# Patient Record
Sex: Male | Born: 1945 | Race: Black or African American | Hispanic: No | Smoking: Current every day smoker
Health system: Southern US, Community
[De-identification: ages and names within clinical notes are randomized; demographics above are authoritative.]

## PROBLEM LIST (undated history)

## (undated) DIAGNOSIS — Z9289 Personal history of other medical treatment: Secondary | ICD-10-CM

## (undated) DIAGNOSIS — I10 Essential (primary) hypertension: Secondary | ICD-10-CM

## (undated) DIAGNOSIS — C61 Malignant neoplasm of prostate: Secondary | ICD-10-CM

## (undated) DIAGNOSIS — IMO0002 Reserved for concepts with insufficient information to code with codable children: Secondary | ICD-10-CM

## (undated) DIAGNOSIS — F329 Major depressive disorder, single episode, unspecified: Secondary | ICD-10-CM

## (undated) DIAGNOSIS — R202 Paresthesia of skin: Secondary | ICD-10-CM

## (undated) DIAGNOSIS — M88859 Osteitis deformans of unspecified thigh: Secondary | ICD-10-CM

## (undated) DIAGNOSIS — R200 Anesthesia of skin: Secondary | ICD-10-CM

## (undated) DIAGNOSIS — M1990 Unspecified osteoarthritis, unspecified site: Secondary | ICD-10-CM

## (undated) DIAGNOSIS — F32A Depression, unspecified: Secondary | ICD-10-CM

## (undated) HISTORY — PX: WRIST GANGLION EXCISION: SUR520

---

## 1997-08-23 ENCOUNTER — Emergency Department (HOSPITAL_COMMUNITY): Admission: EM | Admit: 1997-08-23 | Discharge: 1997-08-23 | Payer: Self-pay | Admitting: Emergency Medicine

## 1998-08-06 ENCOUNTER — Encounter: Payer: Self-pay | Admitting: Emergency Medicine

## 1998-08-06 ENCOUNTER — Emergency Department (HOSPITAL_COMMUNITY): Admission: EM | Admit: 1998-08-06 | Discharge: 1998-08-06 | Payer: Self-pay | Admitting: Emergency Medicine

## 2003-11-29 ENCOUNTER — Emergency Department (HOSPITAL_COMMUNITY): Admission: EM | Admit: 2003-11-29 | Discharge: 2003-11-29 | Payer: Self-pay | Admitting: Emergency Medicine

## 2003-12-08 ENCOUNTER — Ambulatory Visit: Payer: Self-pay | Admitting: Internal Medicine

## 2004-08-08 ENCOUNTER — Emergency Department (HOSPITAL_COMMUNITY): Admission: EM | Admit: 2004-08-08 | Discharge: 2004-08-08 | Payer: Self-pay | Admitting: Emergency Medicine

## 2007-05-03 ENCOUNTER — Emergency Department (HOSPITAL_COMMUNITY): Admission: EM | Admit: 2007-05-03 | Discharge: 2007-05-03 | Payer: Self-pay | Admitting: Emergency Medicine

## 2008-08-21 ENCOUNTER — Emergency Department (HOSPITAL_COMMUNITY): Admission: EM | Admit: 2008-08-21 | Discharge: 2008-08-21 | Payer: Self-pay | Admitting: Emergency Medicine

## 2009-03-01 ENCOUNTER — Ambulatory Visit: Payer: Self-pay | Admitting: Internal Medicine

## 2009-03-01 ENCOUNTER — Encounter (INDEPENDENT_AMBULATORY_CARE_PROVIDER_SITE_OTHER): Payer: Self-pay | Admitting: Family Medicine

## 2009-03-01 LAB — CONVERTED CEMR LAB
Albumin: 4.8 g/dL (ref 3.5–5.2)
BUN: 12 mg/dL (ref 6–23)
Basophils Absolute: 0 10*3/uL (ref 0.0–0.1)
Basophils Relative: 0 % (ref 0–1)
CO2: 25 meq/L (ref 19–32)
Chloride: 103 meq/L (ref 96–112)
Eosinophils Absolute: 0.1 10*3/uL (ref 0.0–0.7)
HCT: 39.4 % (ref 39.0–52.0)
Lymphocytes Relative: 43 % (ref 12–46)
Lymphs Abs: 2.5 10*3/uL (ref 0.7–4.0)
MCHC: 33.2 g/dL (ref 30.0–36.0)
Monocytes Absolute: 0.6 10*3/uL (ref 0.1–1.0)
Neutro Abs: 2.6 10*3/uL (ref 1.7–7.7)
Neutrophils Relative %: 45 % (ref 43–77)
Potassium: 4.2 meq/L (ref 3.5–5.3)
RBC: 4.62 M/uL (ref 4.22–5.81)
Total Bilirubin: 0.6 mg/dL (ref 0.3–1.2)
Total CHOL/HDL Ratio: 4.6

## 2009-03-17 ENCOUNTER — Ambulatory Visit: Payer: Self-pay | Admitting: Internal Medicine

## 2009-03-28 ENCOUNTER — Ambulatory Visit: Payer: Self-pay | Admitting: Internal Medicine

## 2009-03-30 ENCOUNTER — Ambulatory Visit: Payer: Self-pay | Admitting: Internal Medicine

## 2009-04-06 ENCOUNTER — Ambulatory Visit (HOSPITAL_COMMUNITY): Admission: RE | Admit: 2009-04-06 | Discharge: 2009-04-06 | Payer: Self-pay | Admitting: Family Medicine

## 2009-05-17 ENCOUNTER — Ambulatory Visit: Payer: Self-pay | Admitting: Internal Medicine

## 2009-06-07 ENCOUNTER — Ambulatory Visit: Payer: Self-pay | Admitting: Internal Medicine

## 2009-06-07 LAB — CONVERTED CEMR LAB
BUN: 11 mg/dL (ref 6–23)
CO2: 24 meq/L (ref 19–32)
Chloride: 103 meq/L (ref 96–112)
Cholesterol: 200 mg/dL (ref 0–200)
LDL Cholesterol: 130 mg/dL — ABNORMAL HIGH (ref 0–99)
Potassium: 4.3 meq/L (ref 3.5–5.3)
Total CHOL/HDL Ratio: 4.1
VLDL: 21 mg/dL (ref 0–40)

## 2009-07-14 ENCOUNTER — Ambulatory Visit: Payer: Self-pay | Admitting: Internal Medicine

## 2009-07-14 LAB — CONVERTED CEMR LAB
BUN: 11 mg/dL (ref 6–23)
CO2: 23 meq/L (ref 19–32)
Calcium: 9.4 mg/dL (ref 8.4–10.5)
Cholesterol: 197 mg/dL (ref 0–200)
Creatinine, Ser: 1.03 mg/dL (ref 0.40–1.50)
Glucose, Bld: 98 mg/dL (ref 70–99)
Potassium: 4.1 meq/L (ref 3.5–5.3)
Sodium: 138 meq/L (ref 135–145)
Triglycerides: 62 mg/dL (ref <150)
VLDL: 12 mg/dL (ref 0–40)

## 2009-07-28 ENCOUNTER — Ambulatory Visit: Payer: Self-pay | Admitting: Internal Medicine

## 2009-08-18 ENCOUNTER — Ambulatory Visit: Payer: Self-pay | Admitting: Internal Medicine

## 2010-01-02 ENCOUNTER — Encounter (INDEPENDENT_AMBULATORY_CARE_PROVIDER_SITE_OTHER): Payer: Self-pay | Admitting: *Deleted

## 2013-06-18 ENCOUNTER — Other Ambulatory Visit (HOSPITAL_COMMUNITY): Payer: Self-pay | Admitting: Urology

## 2013-06-18 ENCOUNTER — Ambulatory Visit (HOSPITAL_COMMUNITY)
Admission: RE | Admit: 2013-06-18 | Discharge: 2013-06-18 | Disposition: A | Discharge status: Home / Self Care | Payer: Medicare Other | Source: Ambulatory Visit | Attending: Urology | Admitting: Urology

## 2013-06-18 DIAGNOSIS — N508 Other specified disorders of male genital organs: Secondary | ICD-10-CM | POA: Insufficient documentation

## 2013-06-18 DIAGNOSIS — N5089 Other specified disorders of the male genital organs: Secondary | ICD-10-CM

## 2013-07-03 ENCOUNTER — Other Ambulatory Visit (HOSPITAL_COMMUNITY): Payer: Self-pay | Admitting: Urology

## 2013-07-03 DIAGNOSIS — C61 Malignant neoplasm of prostate: Secondary | ICD-10-CM

## 2013-07-22 ENCOUNTER — Other Ambulatory Visit (HOSPITAL_COMMUNITY): Payer: Self-pay | Admitting: Nurse Practitioner

## 2013-07-22 ENCOUNTER — Ambulatory Visit (HOSPITAL_COMMUNITY)
Admission: RE | Admit: 2013-07-22 | Discharge: 2013-07-22 | Disposition: A | Discharge status: Home / Self Care | Payer: Medicare Other | Source: Ambulatory Visit | Attending: Nurse Practitioner | Admitting: Nurse Practitioner

## 2013-07-22 DIAGNOSIS — R52 Pain, unspecified: Secondary | ICD-10-CM

## 2013-07-22 DIAGNOSIS — M25559 Pain in unspecified hip: Secondary | ICD-10-CM | POA: Diagnosis present

## 2013-07-22 DIAGNOSIS — M889 Osteitis deformans of unspecified bone: Secondary | ICD-10-CM | POA: Diagnosis not present

## 2013-07-23 ENCOUNTER — Other Ambulatory Visit (HOSPITAL_COMMUNITY): Payer: Self-pay | Admitting: Urology

## 2013-07-23 ENCOUNTER — Encounter (HOSPITAL_COMMUNITY)
Admission: RE | Admit: 2013-07-23 | Discharge: 2013-07-23 | Disposition: A | Discharge status: Home / Self Care | Payer: Medicare Other | Source: Ambulatory Visit | Attending: Urology | Admitting: Urology

## 2013-07-23 ENCOUNTER — Ambulatory Visit (HOSPITAL_COMMUNITY)
Admission: RE | Admit: 2013-07-23 | Discharge: 2013-07-23 | Disposition: A | Discharge status: Home / Self Care | Payer: Medicare Other | Source: Ambulatory Visit | Attending: Urology | Admitting: Urology

## 2013-07-23 DIAGNOSIS — I10 Essential (primary) hypertension: Secondary | ICD-10-CM | POA: Insufficient documentation

## 2013-07-23 DIAGNOSIS — C61 Malignant neoplasm of prostate: Secondary | ICD-10-CM | POA: Insufficient documentation

## 2013-07-23 MED ORDER — TECHNETIUM TC 99M MEDRONATE IV KIT
26.0000 | PACK | Freq: Once | INTRAVENOUS | Status: AC | PRN
  Administered 2013-07-23: 26 via INTRAVENOUS

## 2013-07-29 ENCOUNTER — Other Ambulatory Visit (HOSPITAL_COMMUNITY): Payer: Self-pay | Admitting: Urology

## 2013-07-29 ENCOUNTER — Ambulatory Visit (HOSPITAL_COMMUNITY)
Admission: RE | Admit: 2013-07-29 | Discharge: 2013-07-29 | Disposition: A | Discharge status: Home / Self Care | Payer: Medicare Other | Source: Ambulatory Visit | Attending: Urology | Admitting: Urology

## 2013-07-29 DIAGNOSIS — Z8546 Personal history of malignant neoplasm of prostate: Secondary | ICD-10-CM | POA: Diagnosis present

## 2013-07-29 DIAGNOSIS — C61 Malignant neoplasm of prostate: Secondary | ICD-10-CM

## 2013-08-11 ENCOUNTER — Other Ambulatory Visit: Payer: Self-pay | Admitting: Urology

## 2013-08-24 ENCOUNTER — Encounter (HOSPITAL_COMMUNITY): Payer: Self-pay | Admitting: Pharmacy Technician

## 2013-08-25 NOTE — Patient Instructions (Addendum)
Marcus [name]  08/25/2013                           YOUR PROCEDURE IS SCHEDULED ON:  09/04/13               ENTER THRU [facility] ENTRANCE AND                            FOLLOW  SIGNS TO SHORT STAY CENTER                 ARRIVE AT SHORT STAY AT:  10:45 AM               CALL THIS NUMBER IF ANY PROBLEMS THE DAY OF SURGERY :               [phone]                                REMEMBER:   Do not eat food or drink liquids AFTER MIDNIGHT              MAY HAVE ONLY  WATER UNTIL 6:45 AM                  Take these medicines the morning of surgery with               A SIPS OF WATER :    NORVASC     Do not wear jewelry, make-up   Do not wear lotions, powders, or perfumes.   Do not shave legs or underarms 12 hrs. before surgery (men may shave face)  Do not bring valuables to the hospital.  Contacts, dentures or bridgework may not be worn into surgery.  Leave suitcase in the car. After surgery it may be brought to your room.  For patients admitted to the hospital more than one night, checkout time is            11:00 AM                                                       The day of discharge.   Patients discharged the day of surgery will not be allowed to drive home.            If going home same day of surgery, must have someone stay with you              FIRST 24 hrs at home and arrange for some one to drive you              home from hospital.   ________________________________________________________________________                                                                        [place]  Before surgery, you can play an important role.  Because skin is not sterile, your  skin needs to be as free of germs as possible.  You can reduce the number of germs on your skin by washing with CHG (chlorahexidine gluconate) soap before surgery.  CHG is an antiseptic cleaner which kills germs and bonds with the skin to  continue killing germs even after washing. Please DO NOT use if you have an allergy to CHG or antibacterial soaps.  If your skin becomes reddened/irritated stop using the CHG and inform your nurse when you arrive at Short Stay. Do not shave (including legs and underarms) for at least 48 hours prior to the first CHG shower.  You may shave your face. Please follow these instructions carefully:   1.  Shower with CHG Soap the night before surgery and the  morning of Surgery.   2.  If you choose to wash your hair, wash your hair first as usual with your  normal  Shampoo.   3.  After you shampoo, rinse your hair and body thoroughly to remove the  shampoo.                                         4.  Use CHG as you would any other liquid soap.  You can apply chg directly  to the skin and wash . Gently wash with scrungie or clean wascloth    5.  Apply the CHG Soap to your body ONLY FROM THE NECK DOWN.   Do not use on open                           Wound or open sores. Avoid contact with eyes, ears mouth and genitals (private parts).                        Genitals (private parts) with your normal soap.              6.  Wash thoroughly, paying special attention to the area where your surgery  will be performed.   7.  Thoroughly rinse your body with warm water from the neck down.   8.  DO NOT shower/wash with your normal soap after using and rinsing off  the CHG Soap .                9.  Pat yourself dry with a clean towel.             10.  Wear clean pajamas.             11.  Place clean sheets on your bed the night of your first shower and do not  sleep with pets.  Day of Surgery : Do not apply any lotions/deodorants the morning of surgery.  Please wear clean clothes to the hospital/surgery center.  FAILURE TO FOLLOW THESE INSTRUCTIONS MAY RESULT IN THE CANCELLATION OF YOUR SURGERY    PATIENT  SIGNATURE_________________________________  ______________________________________________________________________    WHAT IS A BLOOD TRANSFUSION? Blood Transfusion Information  A transfusion is the replacement of blood or some of its parts. Blood is made up of multiple cells which provide different functions.  Red blood cells carry oxygen and are used for blood loss replacement.  White blood cells fight against infection.  Platelets control bleeding.  Plasma helps clot blood.  Other blood products are available for specialized needs, such as hemophilia  or other clotting disorders. BEFORE THE TRANSFUSION  Who gives blood for transfusions?   Healthy volunteers who are fully evaluated to make sure their blood is safe. This is blood bank blood. Transfusion therapy is the safest it has ever been in the practice of medicine. Before blood is taken from a donor, a complete history is taken to make sure that person has no history of diseases nor engages in risky social behavior (examples are intravenous drug use or sexual activity with multiple partners). The donor's travel history is screened to minimize risk of transmitting infections, such as malaria. The donated blood is tested for signs of infectious diseases, such as HIV and hepatitis. The blood is then tested to be sure it is compatible with you in order to minimize the chance of a transfusion reaction. If you or a relative donates blood, this is often done in anticipation of surgery and is not appropriate for emergency situations. It takes many days to process the donated blood. RISKS AND COMPLICATIONS Although transfusion therapy is very safe and saves many lives, the main dangers of transfusion include:   Getting an infectious disease.  Developing a transfusion reaction. This is an allergic reaction to something in the blood you were given. Every precaution is taken to prevent this. The decision to have a blood transfusion has been  considered carefully by your caregiver before blood is given. Blood is not given unless the benefits outweigh the risks. AFTER THE TRANSFUSION  Right after receiving a blood transfusion, you will usually feel much better and more energetic. This is especially true if your red blood cells have gotten low (anemic). The transfusion raises the level of the red blood cells which carry oxygen, and this usually causes an energy increase.  The nurse administering the transfusion will monitor you carefully for complications. HOME CARE INSTRUCTIONS  No special instructions are needed after a transfusion. You may find your energy is better. Speak with your caregiver about any limitations on activity for underlying diseases you may have. SEEK MEDICAL CARE IF:   Your condition is not improving after your transfusion.  You develop redness or irritation at the intravenous (IV) site. SEEK IMMEDIATE MEDICAL CARE IF:  Any of the following symptoms occur over the next 12 hours:  Shaking chills.  You have a temperature by mouth above 102 F (38.9 C), not controlled by medicine.  Chest, back, or muscle pain.  People around you feel you are not acting correctly or are confused.  Shortness of breath or difficulty breathing.  Dizziness and fainting.  You get a rash or develop hives.  You have a decrease in urine output.  Your urine turns a dark color or changes to pink, red, or brown. Any of the following symptoms occur over the next 10 days:  You have a temperature by mouth above 102 F (38.9 C), not controlled by medicine.  Shortness of breath.  Weakness after normal activity.  The white part of the eye turns yellow (jaundice).  You have a decrease in the amount of urine or are urinating less often.  Your urine turns a dark color or changes to pink, red, or brown. Document Released: 12/30/1999 Document Revised: 03/26/2011 Document Reviewed: 08/18/2007 [facility] Patient Information 2014  [place], [place].  _______________________________________________________________________

## 2013-08-26 ENCOUNTER — Encounter (HOSPITAL_COMMUNITY)
Admission: RE | Admit: 2013-08-26 | Discharge: 2013-08-26 | Disposition: A | Discharge status: Home / Self Care | Payer: Medicare Other | Source: Ambulatory Visit | Attending: Urology | Admitting: Urology

## 2013-08-26 ENCOUNTER — Encounter (HOSPITAL_COMMUNITY): Payer: Self-pay

## 2013-08-26 DIAGNOSIS — Z01810 Encounter for preprocedural cardiovascular examination: Secondary | ICD-10-CM | POA: Diagnosis not present

## 2013-08-26 DIAGNOSIS — I498 Other specified cardiac arrhythmias: Secondary | ICD-10-CM | POA: Insufficient documentation

## 2013-08-26 DIAGNOSIS — Z01812 Encounter for preprocedural laboratory examination: Secondary | ICD-10-CM | POA: Diagnosis present

## 2013-08-26 DIAGNOSIS — Z01818 Encounter for other preprocedural examination: Secondary | ICD-10-CM | POA: Diagnosis not present

## 2013-08-26 HISTORY — DX: Depression, unspecified: F32.A

## 2013-08-26 HISTORY — DX: Anesthesia of skin: R20.0

## 2013-08-26 HISTORY — DX: Personal history of other medical treatment: Z92.89

## 2013-08-26 HISTORY — DX: Paresthesia of skin: R20.2

## 2013-08-26 HISTORY — DX: Major depressive disorder, single episode, unspecified: F32.9

## 2013-08-26 HISTORY — DX: Malignant neoplasm of prostate: C61

## 2013-08-26 HISTORY — DX: Essential (primary) hypertension: I10

## 2013-08-26 LAB — BASIC METABOLIC PANEL
Anion gap: 12 (ref 5–15)
BUN: 7 mg/dL (ref 6–23)
CALCIUM: 9.5 mg/dL (ref 8.4–10.5)
CO2: 26 mEq/L (ref 19–32)
CREATININE: 0.93 mg/dL (ref 0.50–1.35)
Chloride: 101 mEq/L (ref 96–112)
GFR calc Af Amer: >90 mL/min (ref >90)
GFR, EST NON AFRICAN AMERICAN: 86 mL/min — AB (ref >90)
GLUCOSE: 97 mg/dL (ref 70–99)
Potassium: 4 mEq/L (ref 3.7–5.3)
Sodium: 139 mEq/L (ref 137–147)

## 2013-08-26 LAB — CBC
HEMATOCRIT: 41.1 % (ref 39.0–52.0)
HEMOGLOBIN: 13.9 g/dL (ref 13.0–17.0)
MCH: 30.5 pg (ref 26.0–34.0)
MCHC: 33.8 g/dL (ref 30.0–36.0)
MCV: 90.1 fL (ref 78.0–100.0)
PLATELETS: 152 10*3/uL (ref 150–400)
RBC: 4.56 MIL/uL (ref 4.22–5.81)
RDW: 15.1 % (ref 11.5–15.5)
WBC: 3.7 10*3/uL — AB (ref 4.0–10.5)

## 2013-08-26 NOTE — Progress Notes (Signed)
08/26/13 1136  OBSTRUCTIVE SLEEP APNEA  Have you ever been diagnosed with sleep apnea through a sleep study? No  Do you snore loudly (loud enough to be heard through closed doors)?  1  Do you often feel tired, fatigued, or sleepy during the daytime? 1  Has anyone observed you stop breathing during your sleep? 0  Do you have, or are you being treated for high blood pressure? 1  BMI more than 35 kg/m2? 0  Age over 66 years old? 1  Neck circumference greater than 40 cm/16 inches? 0  Gender: 1  Obstructive Sleep Apnea Score 5  Score 4 or greater  Results sent to PCP

## 2013-09-04 ENCOUNTER — Inpatient Hospital Stay (HOSPITAL_COMMUNITY): Payer: Medicare Other | Admitting: Anesthesiology

## 2013-09-04 ENCOUNTER — Inpatient Hospital Stay (HOSPITAL_COMMUNITY)
Admission: RE | Admit: 2013-09-04 | Discharge: 2013-09-05 | DRG: 708 | Disposition: A | Discharge status: Home / Self Care | Payer: Medicare Other | Source: Ambulatory Visit | Attending: Urology | Admitting: Urology

## 2013-09-04 ENCOUNTER — Encounter (HOSPITAL_COMMUNITY)
Admission: RE | Disposition: A | Discharge status: Home / Self Care | Payer: Self-pay | Source: Ambulatory Visit | Attending: Urology

## 2013-09-04 ENCOUNTER — Encounter (HOSPITAL_COMMUNITY): Payer: Self-pay | Admitting: *Deleted

## 2013-09-04 DIAGNOSIS — F3289 Other specified depressive episodes: Secondary | ICD-10-CM | POA: Diagnosis present

## 2013-09-04 DIAGNOSIS — F329 Major depressive disorder, single episode, unspecified: Secondary | ICD-10-CM | POA: Diagnosis present

## 2013-09-04 DIAGNOSIS — C61 Malignant neoplasm of prostate: Secondary | ICD-10-CM

## 2013-09-04 DIAGNOSIS — Z01812 Encounter for preprocedural laboratory examination: Secondary | ICD-10-CM

## 2013-09-04 DIAGNOSIS — F17200 Nicotine dependence, unspecified, uncomplicated: Secondary | ICD-10-CM | POA: Diagnosis present

## 2013-09-04 DIAGNOSIS — M889 Osteitis deformans of unspecified bone: Secondary | ICD-10-CM | POA: Diagnosis present

## 2013-09-04 DIAGNOSIS — I10 Essential (primary) hypertension: Secondary | ICD-10-CM | POA: Diagnosis present

## 2013-09-04 HISTORY — DX: Malignant neoplasm of prostate: C61

## 2013-09-04 HISTORY — PX: LYMPHADENECTOMY: SHX5960

## 2013-09-04 HISTORY — PX: ROBOT ASSISTED LAPAROSCOPIC RADICAL PROSTATECTOMY: SHX5141

## 2013-09-04 LAB — TYPE AND SCREEN
ABO/RH(D): A POS
Antibody Screen: NEGATIVE

## 2013-09-04 LAB — HEMOGLOBIN AND HEMATOCRIT, BLOOD
HEMATOCRIT: 33.2 % — AB (ref 39.0–52.0)
Hemoglobin: 11.5 g/dL — ABNORMAL LOW (ref 13.0–17.0)

## 2013-09-04 LAB — ABO/RH: ABO/RH(D): A POS

## 2013-09-04 SURGERY — ROBOTIC ASSISTED LAPAROSCOPIC RADICAL PROSTATECTOMY
Anesthesia: General | Site: Prostate

## 2013-09-04 MED ORDER — FENTANYL CITRATE 0.05 MG/ML IJ SOLN
INTRAMUSCULAR | Status: AC
  Filled 2013-09-04: qty 2

## 2013-09-04 MED ORDER — PNEUMOCOCCAL VAC POLYVALENT 25 MCG/0.5ML IJ INJ
0.5000 mL | INJECTION | INTRAMUSCULAR | Status: AC
  Administered 2013-09-05: 0.5 mL via INTRAMUSCULAR
  Filled 2013-09-04 (×2): qty 0.5

## 2013-09-04 MED ORDER — PROPOFOL 10 MG/ML IV BOLUS
INTRAVENOUS | Status: DC | PRN
  Administered 2013-09-04: 170 mg via INTRAVENOUS

## 2013-09-04 MED ORDER — SENNOSIDES-DOCUSATE SODIUM 8.6-50 MG PO TABS
2.0000 | ORAL_TABLET | Freq: Every day | ORAL | Status: DC
  Administered 2013-09-04: 2 via ORAL
  Filled 2013-09-04 (×2): qty 2

## 2013-09-04 MED ORDER — PHENYLEPHRINE HCL 10 MG/ML IJ SOLN
INTRAMUSCULAR | Status: DC | PRN
  Administered 2013-09-04: 40 ug via INTRAVENOUS
  Administered 2013-09-04 (×2): 80 ug via INTRAVENOUS
  Administered 2013-09-04: 40 ug via INTRAVENOUS
  Administered 2013-09-04: 80 ug via INTRAVENOUS

## 2013-09-04 MED ORDER — SODIUM CHLORIDE 0.9 % IJ SOLN
INTRAMUSCULAR | Status: AC
  Filled 2013-09-04: qty 50

## 2013-09-04 MED ORDER — ACETAMINOPHEN 325 MG PO TABS: 650.0000 mg | ORAL_TABLET | ORAL | Status: DC | PRN

## 2013-09-04 MED ORDER — HYDROMORPHONE HCL PF 1 MG/ML IJ SOLN
INTRAMUSCULAR | Status: AC
  Filled 2013-09-04: qty 1

## 2013-09-04 MED ORDER — GLYCOPYRROLATE 0.2 MG/ML IJ SOLN
INTRAMUSCULAR | Status: DC | PRN
  Administered 2013-09-04 (×4): 0.1 mg via INTRAVENOUS
  Administered 2013-09-04: 0.4 mg via INTRAVENOUS

## 2013-09-04 MED ORDER — CEFAZOLIN SODIUM-DEXTROSE 2-3 GM-% IV SOLR
2.0000 g | INTRAVENOUS | Status: AC
  Administered 2013-09-04: 2 g via INTRAVENOUS

## 2013-09-04 MED ORDER — HYDROCODONE-ACETAMINOPHEN 5-325 MG PO TABS: 1.0000 | ORAL_TABLET | Freq: Four times a day (QID) | ORAL | Status: DC | PRN

## 2013-09-04 MED ORDER — SODIUM CHLORIDE 0.9 % IV BOLUS (SEPSIS)
1000.0000 mL | Freq: Once | INTRAVENOUS | Status: AC
  Administered 2013-09-04: 1000 mL via INTRAVENOUS

## 2013-09-04 MED ORDER — LIDOCAINE HCL (CARDIAC) 20 MG/ML IV SOLN
INTRAVENOUS | Status: DC | PRN
  Administered 2013-09-04: 80 mg via INTRAVENOUS

## 2013-09-04 MED ORDER — DEXAMETHASONE SODIUM PHOSPHATE 10 MG/ML IJ SOLN
INTRAMUSCULAR | Status: DC | PRN
  Administered 2013-09-04: 10 mg via INTRAVENOUS

## 2013-09-04 MED ORDER — DEXAMETHASONE SODIUM PHOSPHATE 10 MG/ML IJ SOLN
INTRAMUSCULAR | Status: AC
  Filled 2013-09-04: qty 1

## 2013-09-04 MED ORDER — DIPHENHYDRAMINE HCL 12.5 MG/5ML PO ELIX: 12.5000 mg | ORAL_SOLUTION | Freq: Four times a day (QID) | ORAL | Status: DC | PRN

## 2013-09-04 MED ORDER — LACTATED RINGERS IV SOLN
INTRAVENOUS | Status: DC
  Administered 2013-09-04 (×3): via INTRAVENOUS

## 2013-09-04 MED ORDER — ONDANSETRON HCL 4 MG/2ML IJ SOLN: 4.0000 mg | INTRAMUSCULAR | Status: DC | PRN

## 2013-09-04 MED ORDER — LIDOCAINE HCL (CARDIAC) 20 MG/ML IV SOLN
INTRAVENOUS | Status: AC
  Filled 2013-09-04: qty 5

## 2013-09-04 MED ORDER — HYDROCODONE-ACETAMINOPHEN 5-325 MG PO TABS
1.0000 | ORAL_TABLET | ORAL | Status: DC | PRN
  Administered 2013-09-04 (×2): 1 via ORAL
  Filled 2013-09-04 (×2): qty 1

## 2013-09-04 MED ORDER — GLYCOPYRROLATE 0.2 MG/ML IJ SOLN
INTRAMUSCULAR | Status: AC
  Filled 2013-09-04: qty 3

## 2013-09-04 MED ORDER — METOCLOPRAMIDE HCL 5 MG/ML IJ SOLN
INTRAMUSCULAR | Status: AC
  Filled 2013-09-04: qty 2

## 2013-09-04 MED ORDER — PHENYLEPHRINE 40 MCG/ML (10ML) SYRINGE FOR IV PUSH (FOR BLOOD PRESSURE SUPPORT)
PREFILLED_SYRINGE | INTRAVENOUS | Status: AC
  Filled 2013-09-04: qty 10

## 2013-09-04 MED ORDER — FENTANYL CITRATE 0.05 MG/ML IJ SOLN
INTRAMUSCULAR | Status: DC | PRN
  Administered 2013-09-04: 25 ug via INTRAVENOUS
  Administered 2013-09-04 (×5): 50 ug via INTRAVENOUS
  Administered 2013-09-04: 25 ug via INTRAVENOUS
  Administered 2013-09-04: 50 ug via INTRAVENOUS
  Administered 2013-09-04: 25 ug via INTRAVENOUS
  Administered 2013-09-04: 50 ug via INTRAVENOUS
  Administered 2013-09-04: 25 ug via INTRAVENOUS

## 2013-09-04 MED ORDER — ONDANSETRON HCL 4 MG/2ML IJ SOLN
INTRAMUSCULAR | Status: AC
  Filled 2013-09-04: qty 2

## 2013-09-04 MED ORDER — OXYBUTYNIN CHLORIDE 5 MG PO TABS
5.0000 mg | ORAL_TABLET | Freq: Three times a day (TID) | ORAL | Status: DC | PRN
  Filled 2013-09-04: qty 1

## 2013-09-04 MED ORDER — GLYCOPYRROLATE 0.2 MG/ML IJ SOLN
INTRAMUSCULAR | Status: AC
  Filled 2013-09-04: qty 1

## 2013-09-04 MED ORDER — NEOSTIGMINE METHYLSULFATE 10 MG/10ML IV SOLN
INTRAVENOUS | Status: DC | PRN
  Administered 2013-09-04: 1 mg via INTRAVENOUS
  Administered 2013-09-04: 3 mg via INTRAVENOUS
  Administered 2013-09-04: 1 mg via INTRAVENOUS

## 2013-09-04 MED ORDER — DEXTROSE-NACL 5-0.9 % IV SOLN
INTRAVENOUS | Status: DC
  Administered 2013-09-04 – 2013-09-05 (×2): via INTRAVENOUS

## 2013-09-04 MED ORDER — KETOROLAC TROMETHAMINE 15 MG/ML IJ SOLN
INTRAMUSCULAR | Status: AC
Start: 2013-09-04 — End: 2013-09-05
  Filled 2013-09-04: qty 1

## 2013-09-04 MED ORDER — MIDAZOLAM HCL 5 MG/5ML IJ SOLN
INTRAMUSCULAR | Status: DC | PRN
  Administered 2013-09-04 (×2): 1 mg via INTRAVENOUS

## 2013-09-04 MED ORDER — MIDAZOLAM HCL 2 MG/2ML IJ SOLN
INTRAMUSCULAR | Status: AC
  Filled 2013-09-04: qty 2

## 2013-09-04 MED ORDER — LACTATED RINGERS IR SOLN
Status: DC | PRN
  Administered 2013-09-04: 1

## 2013-09-04 MED ORDER — FENTANYL CITRATE 0.05 MG/ML IJ SOLN
25.0000 ug | INTRAMUSCULAR | Status: DC | PRN
  Administered 2013-09-04 (×3): 50 ug via INTRAVENOUS

## 2013-09-04 MED ORDER — EPHEDRINE SULFATE 50 MG/ML IJ SOLN
INTRAMUSCULAR | Status: DC | PRN
  Administered 2013-09-04: 5 mg via INTRAVENOUS
  Administered 2013-09-04: 7.5 mg via INTRAVENOUS
  Administered 2013-09-04: 10 mg via INTRAVENOUS

## 2013-09-04 MED ORDER — FENTANYL CITRATE 0.05 MG/ML IJ SOLN
INTRAMUSCULAR | Status: AC
  Filled 2013-09-04: qty 5

## 2013-09-04 MED ORDER — ROCURONIUM BROMIDE 100 MG/10ML IV SOLN
INTRAVENOUS | Status: AC
  Filled 2013-09-04: qty 1

## 2013-09-04 MED ORDER — KETOROLAC TROMETHAMINE 15 MG/ML IJ SOLN
15.0000 mg | Freq: Four times a day (QID) | INTRAMUSCULAR | Status: DC
  Administered 2013-09-04 – 2013-09-05 (×3): 15 mg via INTRAVENOUS
  Filled 2013-09-04 (×5): qty 1

## 2013-09-04 MED ORDER — BUPIVACAINE LIPOSOME 1.3 % IJ SUSP
20.0000 mL | Freq: Once | INTRAMUSCULAR | Status: AC
  Administered 2013-09-04: 20 mL
  Filled 2013-09-04: qty 20

## 2013-09-04 MED ORDER — CEFAZOLIN SODIUM 1-5 GM-% IV SOLN
1.0000 g | Freq: Three times a day (TID) | INTRAVENOUS | Status: AC
  Administered 2013-09-04 – 2013-09-05 (×2): 1 g via INTRAVENOUS
  Filled 2013-09-04 (×2): qty 50

## 2013-09-04 MED ORDER — METOCLOPRAMIDE HCL 5 MG/ML IJ SOLN
INTRAMUSCULAR | Status: DC | PRN
  Administered 2013-09-04: 10 mg via INTRAVENOUS

## 2013-09-04 MED ORDER — CEFAZOLIN SODIUM-DEXTROSE 2-3 GM-% IV SOLR
INTRAVENOUS | Status: AC
  Filled 2013-09-04: qty 50

## 2013-09-04 MED ORDER — ROCURONIUM BROMIDE 100 MG/10ML IV SOLN
INTRAVENOUS | Status: DC | PRN
  Administered 2013-09-04: 5 mg via INTRAVENOUS
  Administered 2013-09-04: 50 mg via INTRAVENOUS
  Administered 2013-09-04 (×2): 10 mg via INTRAVENOUS

## 2013-09-04 MED ORDER — PROMETHAZINE HCL 25 MG/ML IJ SOLN: 6.2500 mg | INTRAMUSCULAR | Status: DC | PRN

## 2013-09-04 MED ORDER — HYDROMORPHONE HCL PF 1 MG/ML IJ SOLN
0.2500 mg | INTRAMUSCULAR | Status: DC | PRN
  Administered 2013-09-04 (×4): 0.5 mg via INTRAVENOUS

## 2013-09-04 MED ORDER — CIPROFLOXACIN HCL 500 MG PO TABS: 500.0000 mg | ORAL_TABLET | Freq: Two times a day (BID) | ORAL | Status: DC

## 2013-09-04 MED ORDER — PROPOFOL 10 MG/ML IV BOLUS
INTRAVENOUS | Status: AC
  Filled 2013-09-04: qty 20

## 2013-09-04 MED ORDER — ONDANSETRON HCL 4 MG/2ML IJ SOLN
INTRAMUSCULAR | Status: DC | PRN
  Administered 2013-09-04: 4 mg via INTRAVENOUS

## 2013-09-04 MED ORDER — DIPHENHYDRAMINE HCL 50 MG/ML IJ SOLN: 12.5000 mg | Freq: Four times a day (QID) | INTRAMUSCULAR | Status: DC | PRN

## 2013-09-04 MED ORDER — FENTANYL CITRATE 0.05 MG/ML IJ SOLN: 25.0000 ug | INTRAMUSCULAR | Status: DC | PRN

## 2013-09-04 MED ORDER — AMLODIPINE BESYLATE 5 MG PO TABS
5.0000 mg | ORAL_TABLET | Freq: Every morning | ORAL | Status: DC
  Administered 2013-09-05: 5 mg via ORAL
  Filled 2013-09-04 (×2): qty 1

## 2013-09-04 SURGICAL SUPPLY — 55 items
ADH SKN CLS APL DERMABOND .7 (GAUZE/BANDAGES/DRESSINGS) ×2
CABLE HIGH FREQUENCY MONO STRZ (ELECTRODE) ×4 IMPLANT
CANISTER SUCTION 2500CC (MISCELLANEOUS) ×4 IMPLANT
CATH FOLEY 2WAY SLVR 18FR 30CC (CATHETERS) ×4 IMPLANT
CATH TIEMANN FOLEY 18FR 5CC (CATHETERS) ×4 IMPLANT
CHLORAPREP W/TINT 26ML (MISCELLANEOUS) ×4 IMPLANT
CLIP LIGATING HEM O LOK PURPLE (MISCELLANEOUS) ×10 IMPLANT
CLOTH BEACON ORANGE TIMEOUT ST (SAFETY) ×4 IMPLANT
CONT SPECI 4OZ STER CLIK (MISCELLANEOUS) ×2 IMPLANT
COVER SURGICAL LIGHT HANDLE (MISCELLANEOUS) ×4 IMPLANT
COVER TIP SHEARS 8 DVNC (MISCELLANEOUS) ×2 IMPLANT
COVER TIP SHEARS 8MM DA VINCI (MISCELLANEOUS) ×2
CUTTER ECHEON FLEX ENDO 45 340 (ENDOMECHANICALS) ×4 IMPLANT
DECANTER SPIKE VIAL GLASS SM (MISCELLANEOUS) ×4 IMPLANT
DERMABOND ADVANCED (GAUZE/BANDAGES/DRESSINGS) ×2
DERMABOND ADVANCED .7 DNX12 (GAUZE/BANDAGES/DRESSINGS) ×2 IMPLANT
DRSG TEGADERM 4X4.75 (GAUZE/BANDAGES/DRESSINGS) ×8 IMPLANT
DRSG TEGADERM 6X8 (GAUZE/BANDAGES/DRESSINGS) ×8 IMPLANT
ELECT REM PT RETURN 9FT ADLT (ELECTROSURGICAL) ×4
ELECTRODE REM PT RTRN 9FT ADLT (ELECTROSURGICAL) ×2 IMPLANT
GAUZE SPONGE 2X2 8PLY STRL LF (GAUZE/BANDAGES/DRESSINGS) ×2 IMPLANT
GLOVE BIO SURGEON STRL SZ 6.5 (GLOVE) ×3 IMPLANT
GLOVE BIO SURGEONS STRL SZ 6.5 (GLOVE) ×1
GLOVE BIOGEL M STRL SZ7.5 (GLOVE) ×8 IMPLANT
GLOVE BIOGEL PI IND STRL 7.5 (GLOVE) IMPLANT
GLOVE BIOGEL PI INDICATOR 7.5 (GLOVE)
GOWN STRL REUS W/TWL LRG LVL4 (GOWN DISPOSABLE) ×12 IMPLANT
HOLDER FOLEY CATH W/STRAP (MISCELLANEOUS) ×4 IMPLANT
IV LACTATED RINGERS 1000ML (IV SOLUTION) ×4 IMPLANT
KIT ACCESSORY DA VINCI DISP (KITS)
KIT ACCESSORY DVNC DISP (KITS) IMPLANT
KIT PROCEDURE DA VINCI SI (MISCELLANEOUS) ×2
KIT PROCEDURE DVNC SI (MISCELLANEOUS) ×2 IMPLANT
NDL INSUFFLATION 14GA 120MM (NEEDLE) ×2 IMPLANT
NEEDLE INSUFFLATION 14GA 120MM (NEEDLE) ×4 IMPLANT
NEEDLE SPNL 22GX7 SPINOC (NEEDLE) IMPLANT
PACK ROBOT UROLOGY CUSTOM (CUSTOM PROCEDURE TRAY) ×4 IMPLANT
RELOAD GREEN ECHELON 45 (STAPLE) ×4 IMPLANT
SET TUBE IRRIG SUCTION NO TIP (IRRIGATION / IRRIGATOR) ×4 IMPLANT
SOLUTION ELECTROLUBE (MISCELLANEOUS) ×4 IMPLANT
SPONGE GAUZE 2X2 STER 10/PKG (GAUZE/BANDAGES/DRESSINGS) ×2
SPONGE LAP 4X18 X RAY DECT (DISPOSABLE) ×4 IMPLANT
SUT ETHILON 3 0 PS 1 (SUTURE) ×4 IMPLANT
SUT MNCRL AB 4-0 PS2 18 (SUTURE) ×8 IMPLANT
SUT PDS AB 1 CT1 27 (SUTURE) ×8 IMPLANT
SUT VIC AB 2-0 SH 27 (SUTURE) ×4
SUT VIC AB 2-0 SH 27X BRD (SUTURE) ×2 IMPLANT
SUT VICRYL 0 UR6 27IN ABS (SUTURE) ×4 IMPLANT
SUT VLOC BARB 180 ABS3/0GR12 (SUTURE) ×12
SUTURE VLOC BRB 180 ABS3/0GR12 (SUTURE) ×6 IMPLANT
SYRINGE 1CC 27X.5 TB SAFETYGLD (MISCELLANEOUS) ×4 IMPLANT
TOWEL OR 17X26 10 PK STRL BLUE (TOWEL DISPOSABLE) ×4 IMPLANT
TOWEL OR NON WOVEN STRL DISP B (DISPOSABLE) ×4 IMPLANT
TROCAR 12M 150ML BLUNT (TROCAR) ×4 IMPLANT
WATER STERILE IRR 1500ML POUR (IV SOLUTION) ×8 IMPLANT

## 2013-09-04 NOTE — Discharge Instructions (Signed)

## 2013-09-04 NOTE — Progress Notes (Signed)
Patient ID: Marcus [name], male   DOB: [birth year], 68 y.o.   MRN: [id number]   I was called for some oozing from the preumbilical incision and the drain site.  He has minimal ooze from the periumbilical incision but he has more moderate oozing on the drain dressing and has some bloody serous fluid in the drain but it is no excessive.   He has no specific pain complaints and his abdomen is soft.  He BP is good and he is not tachycardic.  He has a H&H ordered for tomorrow am and he will be monitored closely.

## 2013-09-04 NOTE — Anesthesia Preprocedure Evaluation (Addendum)
Anesthesia Evaluation  Patient identified by MRN, date of birth, ID band Patient awake    Reviewed: Allergy & Precautions, H&P , NPO status , Patient's Chart, lab work & pertinent test results  Airway Mallampati: II TM Distance: >3 FB Neck ROM: Full    Dental  (+) Teeth Intact, Dental Advisory Given   Pulmonary Current Smoker,  breath sounds clear to auscultation        Cardiovascular Exercise Tolerance: Good hypertension, Pt. on medications - angina- DOE Rhythm:Regular Rate:Normal  Pt describes good functional status walking 4-16miles several times a week without DOE or anginal symptoms.   Neuro/Psych PSYCHIATRIC DISORDERS Depression negative neurological ROS     GI/Hepatic negative GI ROS, Neg liver ROS,   Endo/Other  negative endocrine ROS  Renal/GU negative Renal ROS  negative genitourinary   Musculoskeletal negative musculoskeletal ROS (+)   Abdominal   Peds  Hematology negative hematology ROS (+)   Anesthesia Other Findings   Reproductive/Obstetrics negative OB ROS                         Anesthesia Physical Anesthesia Plan  ASA: II  Anesthesia Plan: General   Post-op Pain Management:    Induction: Intravenous  Airway Management Planned: Oral ETT  Additional Equipment: None  Intra-op Plan:   Post-operative Plan: Extubation in OR  Informed Consent: I have reviewed the patients History and Physical, chart, labs and discussed the procedure including the risks, benefits and alternatives for the proposed anesthesia with the patient or authorized representative who has indicated his/her understanding and acceptance.   Dental advisory given  Plan Discussed with: CRNA and Surgeon  Anesthesia Plan Comments:         Anesthesia Quick Evaluation

## 2013-09-04 NOTE — H&P (Signed)
Marcus [name] is an 68 y.o. male.    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 TRUS 49mL and no obvious extension. CT, Bone-Scan, Site-speific plain films 07/2013 without obvious metastasis. 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   PMH completely unremarkable, no prior surgeries. His PCP is Geraldo Pitter with [facility].   Today "Fuzzy"  seen to proceed with robotic prostatectomy as initial step in management of his high risk prostate cancer.   Past Medical History  Diagnosis Date  . Hypertension   . Numbness and tingling of right arm   . Prostate cancer   . History of transfusion     AS CHILD  . Depression     No past surgical history on file.  No family history on file. Social History:  reports that he has been smoking.  He has never used smokeless tobacco. He reports that he drinks alcohol. His drug history is not on file.  Allergies: No Known Allergies  No prescriptions prior to admission    No results found for this or any previous visit (from the past 48 hour(s)). No results found.  Review of Systems  Constitutional: Negative.  Negative for fever and chills.  HENT: Negative.   Eyes: Negative.   Respiratory: Negative.   Cardiovascular: Negative.   Gastrointestinal: Negative.  Negative for nausea and vomiting.  Genitourinary: Negative.   Musculoskeletal: Negative.   Skin: Negative.   Neurological: Negative.   Endo/Heme/Allergies: Negative.   Psychiatric/Behavioral: Negative.     There were no vitals taken for this visit. Physical Exam  Constitutional: He is oriented to person, place, and time. He appears well-developed and well-nourished.  HENT:  Head: Normocephalic and atraumatic.  Eyes: EOM are normal. Pupils are equal, round, and reactive to light.  Neck: Normal range of  motion. Neck supple.  Cardiovascular: Normal rate.   Respiratory: Effort normal.  GI: Soft. Bowel sounds are normal.  Genitourinary: Penis normal.  Musculoskeletal: Normal range of motion.  Neurological: He is alert and oriented to person, place, and time.  Skin: Skin is warm and dry.  Psychiatric: He has a normal mood and affect. His behavior is normal. Judgment and thought content normal.     Assessment/Plan  1 - High Risk Prostate Cancer - Difficult situation with likely metastatic disease based on PSA, but no measurable extra-prostatic disease on imaging. Discussed role of local therapy, but that it would be only part of likely multi-modal therapy.  We rediscussed prostatectomy and specifically robotic prostatectomy with bilateral pelvic lymphadenectomy being the technique that I most commonly perform. I showed the patient on their abdomen the approximately 6 small incision (trocar) sites as well as presumed extraction sites with robotic approach as well as possible open incision sites should open conversion be necessary. We rediscussed peri-operative risks including bleeding, infection, deep vein thrombosis, pulmonary embolism, compartment syndrome, nuropathy / neuropraxia, heart attack, stroke, death, as well as long-term risks such as non-cure / need for additional therapy. We specifically readdressed that the procedure would compromise urinary control leading to stress incontinence which typically resolves with time and pelvic rehabilitation (Kegel's, etc..), but can sometimes be permanent and require additional therapy including surgery. We also specifically readdressed sexual sequellae including significant erectile dysfunction which typically partially resolves with time but can also be  permanent and require additional therapy including surgery.   We rediscussed the typical hospital course including usual 1-2 night hospitalization, discharge with foley catheter in place usually for 1-2  weeks before voiding trial as well as usually 2 week recovery until able to perform most non-strenuous activity and 6 weeks until able to return to most jobs and more strenuous activity such as exercise.      Andreus Cure 09/04/2013, 6:46 AM

## 2013-09-04 NOTE — Anesthesia Postprocedure Evaluation (Signed)
  Anesthesia Post-op Note  Patient: Marcus [name]  Procedure(s) Performed: Procedure(s): ROBOTIC ASSISTED LAPAROSCOPIC RADICAL PROSTATECTOMY, INDOCYANINE GREEN DYE (N/A) PELVIC LYMPH NODE DISSECTION (Bilateral)  Patient Location: PACU  Anesthesia Type:General  Level of Consciousness: awake, alert  and oriented  Airway and Oxygen Therapy: Patient Spontanous Breathing  Post-op Pain: mild  Post-op Assessment: Post-op Vital signs reviewed  Post-op Vital Signs: Reviewed  Last Vitals:  Filed Vitals:   09/04/13 1830  BP: 139/69  Pulse: 87  Temp:   Resp: 12    Complications: No apparent anesthesia complications

## 2013-09-04 NOTE — Transfer of Care (Signed)
Immediate Anesthesia Transfer of Care Note  Patient: Marcus [name]  Procedure(s) Performed: Procedure(s): ROBOTIC ASSISTED LAPAROSCOPIC RADICAL PROSTATECTOMY, INDOCYANINE GREEN DYE (N/A) PELVIC LYMPH NODE DISSECTION (Bilateral)  Patient Location: PACU  Anesthesia Type:General  Level of Consciousness: Patient easily awoken, sedated, comfortable, cooperative, following commands, responds to stimulation.   Airway & Oxygen Therapy: Patient spontaneously breathing, ventilating well, oxygen via simple oxygen mask.  Post-op Assessment: Report given to PACU RN, vital signs reviewed and stable, moving all extremities.   Post vital signs: Reviewed and stable.  Complications: No apparent anesthesia complications

## 2013-09-04 NOTE — Brief Op Note (Signed)
09/04/2013  4:34 PM  PATIENT:  Marcus [name]  67 y.o. male  PRE-OPERATIVE DIAGNOSIS:  HIGH RISK PROSTATE CANCER  POST-OPERATIVE DIAGNOSIS:  HIGH RISK PROSTATE CANCER  PROCEDURE:  Procedure(s): ROBOTIC ASSISTED LAPAROSCOPIC RADICAL PROSTATECTOMY, INDOCYANINE GREEN DYE (N/A) PELVIC LYMPH NODE DISSECTION (Bilateral)  SURGEON:  Surgeon(s) and Role:    * Alexis Frock, MD - Primary    * Jonnie Kind, MD  PHYSICIAN ASSISTANT:   ASSISTANTS: Charlton Haws MD   ANESTHESIA:   local and general  EBL:  Total I/O In: 2000 [I.V.:2000] Out: -   BLOOD ADMINISTERED:none  DRAINS: JP to bulb suction   LOCAL MEDICATIONS USED:  MARCAINE     SPECIMEN:  Source of Specimen:  radical prostatectomy, bilateral pelvic lymph nodes, rt perivescial lymph nodes sentinal  DISPOSITION OF SPECIMEN:  PATHOLOGY  COUNTS:  YES  TOURNIQUET:  * No tourniquets in log *  DICTATION: .Other Dictation: Dictation Number [id number]  PLAN OF CARE: Admit to inpatient   PATIENT DISPOSITION:  PACU - hemodynamically stable.   Delay start of Pharmacological VTE agent (>24hrs) due to surgical blood loss or risk of bleeding: yes

## 2013-09-05 DIAGNOSIS — C61 Malignant neoplasm of prostate: Secondary | ICD-10-CM | POA: Diagnosis not present

## 2013-09-05 LAB — BASIC METABOLIC PANEL
ANION GAP: 11 (ref 5–15)
BUN: 8 mg/dL (ref 6–23)
CO2: 25 meq/L (ref 19–32)
CREATININE: 0.9 mg/dL (ref 0.50–1.35)
Calcium: 8.5 mg/dL (ref 8.4–10.5)
Chloride: 99 mEq/L (ref 96–112)
GFR calc Af Amer: >90 mL/min (ref >90)
GFR calc non Af Amer: 87 mL/min — ABNORMAL LOW (ref >90)
GLUCOSE: 170 mg/dL — AB (ref 70–99)
Potassium: 4.5 mEq/L (ref 3.7–5.3)
Sodium: 135 mEq/L — ABNORMAL LOW (ref 137–147)

## 2013-09-05 LAB — HEMOGLOBIN AND HEMATOCRIT, BLOOD
HCT: 34.8 % — ABNORMAL LOW (ref 39.0–52.0)
Hemoglobin: 11.7 g/dL — ABNORMAL LOW (ref 13.0–17.0)

## 2013-09-05 NOTE — Op Note (Signed)
NAMEMarland Kitchen  Marcus, [name] NO.:  [id number]  MEDICAL RECORD NO.:  [id number]  LOCATION:  7858                         FACILITY:  [facility]  PHYSICIAN:  Alexis Frock, MD     DATE OF BIRTH:  10-Jun-1945  DATE OF PROCEDURE:  09/04/2013 DATE OF DISCHARGE:                              OPERATIVE REPORT   PREOPERATIVE DIAGNOSIS:  High-risk prostate cancer.  PROCEDURES: 1. Robotic-assisted laparoscopic radical prostatectomy. 2. Bilateral pelvic lymphadenectomy. 3. Injection of indocyanine green dye for lymphangiography.  ESTIMATED BLOOD LOSS:  100 mL.  COMPLICATIONS:  None.  SPECIMENS: 1. Periprostatic fat. 2. Radical prostatectomy. 3. Right external iliac lymph nodes. 4. Right obturator lymph nodes. 5. Left external iliac lymph nodes. 6. Left obturator lymph nodes. 7. Right perivesical lymph nodes, sentinel.  FINDINGS: 1. Hyperfluorescent lymphatic tissue in the right perivesical area.     No additional hyperflexion tissue on indocyanine green dye     injection of the prostate. 2. Very adherent prostate tissue at bladder neck apex and posteriorly,     worrisome for possibly locally advanced disease. 3. No bulky pelvic lymphadenopathy.  ASSISTANT:  Charlton Haws, M.D.  DRAINS: 1. Jackson-Pratt drain bulb suction. 2. Foley catheter straight drain.  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  He underwent metastatic survey with a CT and bone scan that was unremarkable for a distant disease, but there is still significant clinical suspicion for this.  Options were discussed in detail including treatment as per metastatic with androgen deprivation versus local therapy as a part of multimodal approach versus more of p.r.n. or palliative options, and he adamantly wished to proceed with attempt of  maximal local control with prostatectomy.  We discussed extensively preoperatively that likely he has advanced disease and any therapy would be for goal of local control only.  He has very good understanding of this and adamantly wished to proceed.  Informed consent was obtained and placed in medical record.  PROCEDURE IN DETAIL:  The patient being Marcus [name], was verified. Procedure being radical prostatectomy was confirmed.  Procedure was carried out.  Time-out was performed.  Intravenous antibiotics were administered.  General endotracheal anesthesia was introduced.  The patient was placed into a low lithotomy position and sterile field was created by prepping and draping the patient's penis, perineum, and proximal thighs using iodinex3 and his infraxiphoid abdomen using chlorhexidine gluconate. After he was further fashioned in the upper table using 3-inch tape over his chest and a test of steep Trendelenburg position that was performed.  A Foley catheter was placed for easier straight drain.  Next, a high-flow low pressure pneumoperitoneum was obtained using Veress technique in the infraumbilical midline having passed the aspiration and drop test. Next, 12-mm robotic camera port was placed in the same location and laparoscopic examination of peritoneal cavity revealed no significant adhesions, no visceral injury.  Additional ports placed as follows; right paramedian 8-mm robotic port, right far lateral 12-mm assist port, left paramedian 8-mm robotic port, left far lateral 8-mm robotic port, and the right paramedian  5 mm suction port.  Robot was docked and passed through electronic checks.  Next, attention was directed at lateral dissection.  Incision was made lateral to the left medial umbilical ligament from the anterior abdominal wall coursing along the iliac vessels, and the left bladder was swept away from the pelvic sidewall towards the area of the endopelvic fascia.  The  vas deferens was countered and ligated and also used as a bucket handle for this dissection. Similarly, the right bladder wall was released from the right pelvis.  Anterior bladder test was taken down using cautery scissors.  This exposed the anterior base of the prostate.  This was periprostatic fat which was carefully removed and set aside for permanent pathology.  Next, using a percutaneously placed 18-gauge Chiba needle, 0.2 mL of indocyanine green dye was injected in each lobe of the prostate, followed by intervening, suctioning, and coagulation to prevent spillage which did not occur grossly.  The endopelvic fascia was then carefully developed in a base to  apex orientation.  We exposed the dorsal venous complex which was controlled using endovascular stapler. At this point, then approximately 15 minutes post dye injection and the phallus was interrogated under near infrared fluorescence light.  Indocyanine green dye and sentinel lymphangiography revealed only some right perivesical lymph nodes and these are hyperfluorescent corresponding to suspected prostate sentinel drainage.  There was no obvious hyperfluorescence nodes in the traditional pelvic packets. Given high-risk nature disease, standard lymphadenectomy was performed first with the right external iliac packet with the conduit dissection being right external iliac artery, vein, pelvic side wall, and the iliac bifurcation.  Lymphostasis was achieved with cold clips.  The right obturator packet was similarly developed with lymphostasis achieved with cold clips with the confines being the external iliac vein, obturator nerve, and pelvic side wall.  Similarly, the left external iliac and left obturator lymph nodes were taken.  The obturator nerves were inspected bilaterally following these maneuvers and found to be uninjured.  Next, the hyperfluorescence perivesical tissue on the right was carefully mobilized and lymphostasis  achieved with cold clips and set aside, labeled right perivesical lymph node, sentinel all for permanent pathology.  Next, bladder neck dissection was performed.  Foley catheter was moved forward and backwards to identify the area of the bladder neck which was then dissected in the anterior-posterior direction keeping what appeared to be circular muscle fibers with the bladder neck. Posterior dissection was performed by entering the plane of Denonviller by incising approximately 8 mm inferior and distal posterior prostate base. Bilateral vas deferens were encountered as such a resistance of 4 cm ligated and placed in gentle superior traction.  The bilateral seminal vesicles were also dissected to their tip and placed on gentle superior traction. Posterior dissection was then performed in a base to apex orientation which was then exposed the vascular pedicles purposely and non-nerve sparing approach was taken with wide pedicle dissection and the base of the apex orientation.  Next, anterior dissection was performed exposing the membranous urethra which was transected coldly.  This completely freed up the radical prostatectomy specimen which was placed into an EndoCatch bag for later retrieval.  Next, posterior reconstruction was performed with a running 3-0 V-loc suture reapproximating the posterior urethral plate to the posterior bladder neck.  After digital rectal exam was indicative that revealed no evidence of rectal injury.  This brought the bladder neck and membranous urethral stump into tension-free apposition.  Next, bladder neck to urethra mucosal anastamosis was performed  using double armed 3-0 V-loc suture and anchoring the anterior aspect of the puboprostatic ligaments. New Foley catheter was then easily placed, irrigated quantitatively and there was no evidence of a bladder leak.  Closed suction drain was then brought through the previous left lateral robotic port site into the  area of the pelvis. Robot was then undocked.  The previous right 12-mm port site was closed at low fashion using Carter-Thomason suture passer under laparoscopic vision.  Specimen was retrieved by extending the previous camera port site for total distance approximately 3 cm, removing with a radical prostatectomy specimen and setting it aside for permanent pathology. This area was closed at the level of the fascia using figure-of-eight PDS x3 followed by Sabino Donovan was reapproximated with 2-0 Vicryl.  All incisions were infiltrated with dilute lipolyzed Marcaine, closed at level of skin using subcuticular Monocryl followed by Dermabond. Procedure was then terminated.  The patient tolerated the procedure well.  There were no immediate periprocedural complications.  The patient was taken to the postanesthesia care unit in stable condition.          ______________________________ Alexis Frock, MD     TM/MEDQ  D:  09/04/2013  T:  09/04/2013  Job:  [id number]

## 2013-09-05 NOTE — Discharge Summary (Signed)
Patient ID: Marcus [name], male   DOB: [birth year], 68 y.o.   MRN: [id number] Physician Discharge Summary  Patient ID: Marcus [name] MRN: [id number] DOB/AGE: 08/05/1945 68 y.o.  Admit date: 09/04/2013 Discharge date: 09/05/2013  Admission Diagnoses:  Prostate cancer  Discharge Diagnoses:  Principal Problem:   Prostate cancer   Past Medical History  Diagnosis Date  . Hypertension   . Numbness and tingling of right arm   . Prostate cancer   . History of transfusion     AS CHILD  . Depression     Surgeries: Procedure(s): ROBOTIC ASSISTED LAPAROSCOPIC RADICAL PROSTATECTOMY, INDOCYANINE GREEN DYE PELVIC LYMPH NODE DISSECTION on 09/04/2013   Consultants (if any):    Discharged Condition: Improved  Hospital Course: Marcus [name] is an 68 y.o. male who was admitted 09/04/2013 with a diagnosis of Prostate cancer and went to the operating room on 09/04/2013 and underwent the above named procedures.  He has done well post op and has minimal JP drainage this morning.   His urine is clear and his Hgb is stable at 11.7.   He is ready for discharge.  I will D/C the JP drain.   He was given perioperative antibiotics:      Anti-infectives   Start     Dose/Rate Route Frequency Ordered Stop   09/04/13 1930  ceFAZolin (ANCEF) IVPB 1 g/50 mL premix     1 g 100 mL/hr over 30 Minutes Intravenous Every 8 hours 09/04/13 1919 09/05/13 0326   09/04/13 1051  ceFAZolin (ANCEF) IVPB 2 g/50 mL premix     2 g 100 mL/hr over 30 Minutes Intravenous 30 min pre-op 09/04/13 1051 09/04/13 1259   09/04/13 0000  ciprofloxacin (CIPRO) 500 MG tablet    Comments:  1 tab PO bid starting the day prior to your clinic visit.   500 mg Oral 2 times daily 09/04/13 1638      .  He was given sequential compression devices for DVT prophylaxis.  He benefited maximally from the hospital stay and there were no complications.    Recent vital signs:  Filed Vitals:   09/05/13 0927  BP: 137/67  Pulse: 62  Temp:  98.4 F (36.9 C)  Resp: 14    Recent laboratory studies:  Lab Results  Component Value Date   HGB 11.7* 09/05/2013   HGB 11.5* 09/04/2013   HGB 13.9 08/26/2013   Lab Results  Component Value Date   WBC 3.7* 08/26/2013   PLT 152 08/26/2013   No results found for this basename: INR   Lab Results  Component Value Date   NA 135* 09/05/2013   K 4.5 09/05/2013   CL 99 09/05/2013   CO2 25 09/05/2013   BUN 8 09/05/2013   CREATININE 0.90 09/05/2013   GLUCOSE 170* 09/05/2013    Discharge Medications:     Medication List    STOP taking these medications       meloxicam 15 MG tablet  Commonly known as:  MOBIC      TAKE these medications       amLODipine 5 MG tablet  Commonly known as:  NORVASC  Take 5 mg by mouth every morning.     ciprofloxacin 500 MG tablet  Commonly known as:  CIPRO  Take 1 tablet (500 mg total) by mouth 2 (two) times daily.     HYDROcodone-acetaminophen 5-325 MG per tablet  Commonly known as:  NORCO/VICODIN  Take 1-2 tablets by mouth every 6 (six) hours as  needed for moderate pain.        Diagnostic Studies: No results found.  Disposition: Final discharge disposition not confirmed  D/C Home    Follow-up Information   Follow up with Alexis Frock, MD On 09/14/2013. (at 10:30 AM for MD visit and catheter removal. Dr. Tresa Moore will call you with pathology results prior. )    Specialty:  Urology   Contact information:   [address] [place] [id number] [phone]        Signed: Malka So 09/05/2013, 9:43 AM

## 2013-09-05 NOTE — Progress Notes (Signed)
JP drain removed and IV d/c'd. Discussed d/c instructions and medications with pt; pt verbalized understanding of medications, follow-up appointment, and instructions regarding use of leg bag and Foley bag. Pt discharged, left unit via wheelchair. Leg drainage bag in place. Dominga Ferry, RN

## 2013-09-07 ENCOUNTER — Encounter (HOSPITAL_COMMUNITY): Payer: Self-pay | Admitting: Urology

## 2014-01-01 ENCOUNTER — Other Ambulatory Visit (HOSPITAL_COMMUNITY): Payer: Self-pay | Admitting: Urology

## 2014-01-01 DIAGNOSIS — C61 Malignant neoplasm of prostate: Secondary | ICD-10-CM

## 2014-01-25 ENCOUNTER — Encounter (HOSPITAL_COMMUNITY)
Admission: RE | Admit: 2014-01-25 | Discharge: 2014-01-25 | Disposition: A | Discharge status: Home / Self Care | Payer: Medicare Other | Source: Ambulatory Visit | Attending: Urology | Admitting: Urology

## 2014-01-25 DIAGNOSIS — C61 Malignant neoplasm of prostate: Secondary | ICD-10-CM | POA: Diagnosis not present

## 2014-01-25 MED ORDER — TECHNETIUM TC 99M MEDRONATE IV KIT
27.4000 | PACK | Freq: Once | INTRAVENOUS | Status: AC | PRN
Start: 2014-01-25 — End: 2014-01-25
  Administered 2014-01-25: 27.4 via INTRAVENOUS

## 2014-02-02 ENCOUNTER — Encounter: Payer: Self-pay | Admitting: Radiation Oncology

## 2014-02-02 NOTE — Progress Notes (Signed)
GU Location of Tumor / Histology: Prostate Cancer  With high recurrence risk   If Prostate Cancer, Gleason Score is (4+5=9  ) 09/04/13;  PSA is on  1/11/216=015.79 10/21/13=9.72  Marcus [name] presented  months ago with signs/symptoms of:   Biopsies of  (if applicable) revealed: Diagnosis 8/201/2015: 1. Fatty tissue, periprostatic- MATURE ADIPOSE TISSUE, NO EVIDENCE OF MALIGNANCY.2. Lymph node, biopsy, right external- ONE SMALL LYMPH NODE, AND ADJACENT MATURE ADIPOSE TISSUE, NO EVIDENCE OF MALIGNANCY (0/1).3. Lymph node, biopsy, right obturator- ONE LYMPH NODE AND ADJACENT MATURE ADIPOSE TISSUE, NO EVIDENCE OFMALIGNANCY(0/1).4. Lymph node, sentinel, biopsy, right perivesicle - MATURE ADIPOSE TISSUE.- NO LYMPHOID TISSUE IDENTIFIED.- NO EVIDENCE OF MALIGNANCY.5. Lymph node, biopsy, left external- MATURE ADIPOSE TISSUE.- NO LYMPHOID TISSUE IDENTIFIED.- NO EVIDENCE OF MALIGNANCY.6. Lymph node, biopsy, left obturator- FIVE LYMPH NODES, NEGATIVE FOR METASTATIC CARCINOMA (0/5).7. Prostate, radical resection- PROSTATIC ADENOCARCINOMA, GLEASON'S SCORE 4+5=9 WITH TERTIARY PATTERN 3,INVOLVING BOTH LOBES, WITH EXTRAPROSTATIC EXTENSION PRESENT, INVOLVING THE LEFT AND RIGHT RESECTION MARGINS AND BLADDER NECK MARGIN Microscopic Comment 7. PROSTATERADICAL RESECTION/PROSTATECTOMY Procedure: Prostatectomy.Prostate gland: Weight: 55 grams.Dimension: 4.8 cm apex to base x 5.0 cm right to left x 4.2 cm anterior to posterior.Histologic type: AcinarGleasons Score: 9+9=3TTSVXBLT score (if applicable): 3 presentInvolving (half lobe or less, one or both lobes): Both lobes.  Past/Anticipated interventions by urology, if any:   Past/Anticipated interventions by Urology  if any: Dr. Truman Hayward  Note 02/01/14  Referral for consideration of adjuvant wide field pelvic XRT    Weight changes, if any:  Bowel/Bladder complaints, if any:  ED/s/p prostatectomy,stress incontinence  Using 1 or less pad per day,leaks with  cough,sneeze only,minimal bother , stable left Paget's disease  Nausea/Vomiting, if any:   Pain issues, if any:    SAFETY ISSUES:  Prior radiation? NO  Pacemaker/ICD?  NO  Is the patient on methotrexate?  NO  Current Complaints / other details:  Divorced,  Retired, 4 children,  cigarette smoker <1ppd,4 beers,  Hx depression,HTN,bleeding disorder  Mother =seizure,father=throat cancer Allergies:NKDA

## 2014-02-03 ENCOUNTER — Ambulatory Visit: Payer: Medicare Other

## 2014-02-03 ENCOUNTER — Institutional Professional Consult (permissible substitution): Payer: Medicare HMO | Admitting: Radiation Oncology

## 2014-02-03 ENCOUNTER — Ambulatory Visit
Admission: RE | Admit: 2014-02-03 | Discharge: 2014-02-03 | Disposition: A | Discharge status: Home / Self Care | Payer: Medicare Other | Source: Ambulatory Visit | Attending: Radiation Oncology | Admitting: Radiation Oncology

## 2014-02-03 DIAGNOSIS — C61 Malignant neoplasm of prostate: Secondary | ICD-10-CM

## 2014-02-03 DIAGNOSIS — Z79899 Other long term (current) drug therapy: Secondary | ICD-10-CM | POA: Insufficient documentation

## 2014-02-03 DIAGNOSIS — Z9079 Acquired absence of other genital organ(s): Secondary | ICD-10-CM | POA: Insufficient documentation

## 2014-02-03 DIAGNOSIS — F1099 Alcohol use, unspecified with unspecified alcohol-induced disorder: Secondary | ICD-10-CM | POA: Insufficient documentation

## 2014-02-03 DIAGNOSIS — I10 Essential (primary) hypertension: Secondary | ICD-10-CM | POA: Insufficient documentation

## 2014-02-03 DIAGNOSIS — F17200 Nicotine dependence, unspecified, uncomplicated: Secondary | ICD-10-CM | POA: Insufficient documentation

## 2014-02-03 DIAGNOSIS — N529 Male erectile dysfunction, unspecified: Secondary | ICD-10-CM | POA: Insufficient documentation

## 2014-02-03 HISTORY — DX: Reserved for concepts with insufficient information to code with codable children: IMO0002

## 2014-02-03 HISTORY — DX: Osteitis deformans of unspecified thigh: M88.859

## 2014-02-11 ENCOUNTER — Institutional Professional Consult (permissible substitution): Payer: Medicare HMO | Admitting: Radiation Oncology

## 2014-02-15 ENCOUNTER — Encounter: Payer: Self-pay | Admitting: Radiation Oncology

## 2014-02-16 ENCOUNTER — Encounter: Payer: Self-pay | Admitting: Radiation Oncology

## 2014-02-16 ENCOUNTER — Ambulatory Visit
Admission: RE | Admit: 2014-02-16 | Discharge: 2014-02-16 | Disposition: A | Discharge status: Home / Self Care | Payer: Medicare Other | Source: Ambulatory Visit | Attending: Radiation Oncology | Admitting: Radiation Oncology

## 2014-02-16 ENCOUNTER — Telehealth: Payer: Self-pay | Admitting: *Deleted

## 2014-02-16 VITALS — BP 165/73 | HR 68 | Temp 97.7°F | Resp 20 | Ht 70.0 in | Wt 160.8 lb

## 2014-02-16 DIAGNOSIS — N529 Male erectile dysfunction, unspecified: Secondary | ICD-10-CM | POA: Diagnosis not present

## 2014-02-16 DIAGNOSIS — Z79899 Other long term (current) drug therapy: Secondary | ICD-10-CM | POA: Diagnosis not present

## 2014-02-16 DIAGNOSIS — C61 Malignant neoplasm of prostate: Secondary | ICD-10-CM

## 2014-02-16 DIAGNOSIS — F17200 Nicotine dependence, unspecified, uncomplicated: Secondary | ICD-10-CM | POA: Diagnosis not present

## 2014-02-16 DIAGNOSIS — Z9079 Acquired absence of other genital organ(s): Secondary | ICD-10-CM | POA: Diagnosis not present

## 2014-02-16 DIAGNOSIS — F1099 Alcohol use, unspecified with unspecified alcohol-induced disorder: Secondary | ICD-10-CM | POA: Diagnosis not present

## 2014-02-16 DIAGNOSIS — I10 Essential (primary) hypertension: Secondary | ICD-10-CM | POA: Diagnosis not present

## 2014-02-16 NOTE — Telephone Encounter (Signed)
CALLED PATIENT'S DAUGHTER - Marcus [name] INFORM OF [facility]  APPT. WITH DR. Valere Dross ON 04-20-14 - 7:30 AM FOR AN 8:00 AM APPT., LVM FOR A RETURN CALL

## 2014-02-16 NOTE — Progress Notes (Signed)
Please see the Nurse Progress Note in the MD Initial Consult Encounter for this patient. 

## 2014-02-16 NOTE — Progress Notes (Signed)
[facility] NEW PATIENT EVALUATION  Name: Marcus [name] MRN: [id number]  Date:   02/16/2014           DOB: 1945-06-29  Status: outpatient   CC: Dr. Phebe Colla   REFERRING PHYSICIAN: Dr. Phebe Colla   DIAGNOSIS: Pathologic Stage T3b N0 M0 high risk adenocarcinoma prostate    HISTORY OF PRESENT ILLNESS:  Marcus [name] is a 69 y.o. male who is Seen today through the courtesy of Dr. Tresa Moore for consideration of "salvage" radiation therapy in the management of his pathologic stage T3b N0 high risk adenocarcinoma prostate.  He presented last summer with a markedly elevated PSA of 144.  His PSA was 155 on 06/22/2013.  His rectal examination showed induration involving most of his left lobe of the prostate.  He underwent ultrasound-guided biopsies on 06/30/2013 which showed extensive carcinoma throughout the entire gland.  He had Gleason 9 (4+5) involving the left mid and apical biopsies.  Other biopsies containing Gleason 8 and Gleason 7.  His staging workup including a CAT scan and bone scan were without evidence for metastatic disease.  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  He underwent a robotic prostatectomy and lymphadenectomy on 09/04/2013.  He was found to have extensive Gleason 9 (4+5) with a tertiary pattern of 3.  He had involvement of both lobes with extraprostatic extension present involving the left and right resection margins and bladder neck margin.  Seminal vesicles were not involved.  7 lymph nodes were free of metastatic disease.  He has done well postoperatively, and denies urinary incontinence.  He does have erectile dysfunction as expected.  More recently, he thinks that he strained a right groin muscle after shoveling snow last week.  He also reports right testicular discomfort.  His PSA in October 2015 was 9.72 rising to 15.79 on 01/25/2014.  Repeat CT scans and bone scans on 02/25/2014 were without evidence for  metastatic disease.    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     PAST SURGICAL HISTORY:  Past Surgical History  Procedure Laterality Date  . Robot assisted laparoscopic radical prostatectomy N/A 09/04/2013    Procedure: ROBOTIC ASSISTED LAPAROSCOPIC RADICAL PROSTATECTOMY, INDOCYANINE GREEN DYE;  Surgeon: Alexis Frock, MD;  Location: [facility];  Service: Urology;  Laterality: N/A;  . Lymphadenectomy Bilateral 09/04/2013    Procedure: PELVIC LYMPH NODE DISSECTION;  Surgeon: Alexis Frock, MD;  Location: [facility];  Service: Urology;  Laterality: Bilateral;  . Wrist ganglion excision       FAMILY HISTORY: family history includes Cancer in his father. His father died from throat cancer in his 4s, and his mother died from seizures in her 34s.  No family history of prostate cancer.   SOCIAL HISTORY:  reports that he has been smoking.  He has never used smokeless tobacco. He reports that he drinks alcohol. He reports that he does not use illicit drugs.  Divorced, 4 children.  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  He drinks 80 ounces of beer a day.   ALLERGIES: Review of patient's allergies indicates no known allergies.   MEDICATIONS:  Current Outpatient Prescriptions  Medication Sig Dispense Refill  . HYDROcodone-acetaminophen (NORCO/VICODIN) 5-325 MG per tablet Take 1-2 tablets by mouth every 6 (six) hours as needed for moderate pain. 30 tablet 0  . amLODipine (NORVASC) 5 MG tablet Take 5 mg by mouth every morning.    . nilotinib (TASIGNA) 200 MG capsule Take 400 mg by mouth every 12 (twelve) hours. Give on an empty stomach 1 hour before or 2 hours after meals.     No current  facility-administered medications for this encounter.     REVIEW OF SYSTEMS:  Pertinent items are noted in HPI.    PHYSICAL EXAM:  height is 5\' 10"  (1.778 m) and weight is 160 lb 12.8 oz (72.938 kg). His oral temperature is 97.7 F (36.5 C). His blood pressure is 165/73 and his pulse is 68. His respiration is 20.   Head and neck examination: Grossly unremarkable.  Chest: Lungs clear.  Abdomen: Without masses organomegaly.  Genitalia: Unremarkable to inspection.  The right testis is markedly tender to palpation.  I believe that there is enlargement of the epididymis but examination is too uncomfortable to be certain.  Rectal examination: Prostate bed is flat and is without masses or nodularity.  Extremities: He reports discomfort along his right groin.     LABORATORY DATA:  Lab Results  Component Value Date   WBC 3.7* 08/26/2013   HGB 11.7* 09/05/2013   HCT 34.8* 09/05/2013   MCV 90.1 08/26/2013   PLT 152 08/26/2013   Lab Results  Component Value Date   NA 135* 09/05/2013   K 4.5 09/05/2013   CL 99 09/05/2013   CO2 25 09/05/2013   Lab Results  Component Value Date   ALT 14 03/01/2009   AST 23 03/01/2009   ALKPHOS 91 03/01/2009   BILITOT 0.6 03/01/2009   PSA 15.79 from 01/25/2014   IMPRESSION: Pathologic stage T3b N0 high risk adenocarcinoma the prostate with biochemical progression with PSA approaching a doubling time of just over 3 months.  I agree with Dr. Tresa Moore that he probably has occult metastatic disease even though his recent staging workup is negative.  I explained to Marcus [name] that we probably should give him the both and deprivation therapy along with external beam ration therapy to his prostate bed/pelvis to improve his local regional control, and delay the development of widespread metastatic disease.  We discussed the potential acute and late toxicities of radiation therapy.  I will speak with Dr. Zettie Pho nurse and get him started on androgen deprivation therapy and  then see Marcus [name] back here for a follow-up visit in approximately 2 months.  We will then get him scheduled shortly thereafter for CT simulation.  I told Marcus [name] that if his right groin a right testicular discomfort persist that he needs to see Dr. Tresa Moore for a follow-up visit.   PLAN: As discussed above.     I spent 30 minutes face to face with the patient and more than 50% of that time was spent in counseling and/or coordination of care.

## 2014-02-19 ENCOUNTER — Encounter: Payer: Self-pay | Admitting: *Deleted

## 2014-02-19 NOTE — Progress Notes (Signed)
[place] Psychosocial Distress Screening Clinical Social Work  Clinical Social Work was referred by distress screening protocol.  The patient scored a 6 on the Psychosocial Distress Thermometer which indicates moderate distress. Clinical Social Worker phoned Marcus [name] to assess for distress and other psychosocial needs. CSW spoke with daughter, Wake who has been handling all his concerns. Marcus [name] was homeless for a few years and daughter is assisting. CSW reviewed resources at [facility] to assist. CSW encouraged Marcus [name] and daughter to meet with financial advocate as well. Marcus [name] is applying for medicaid to assist too. Marcus [name] may qualify for additional assistance as well.   ONCBCN DISTRESS SCREENING 02/16/2014  Screening Type Initial Screening  Distress experienced in past week (1-10) 6  Practical problem type Housing;Insurance;Food  Family Problem type Other (comment)  Emotional problem type Adjusting to illness;Boredom  Spiritual/Religous concerns type Facing my mortality  Physical Problem type Pain;Sleep/insomnia;Getting around;Bathing/dressing;Constipation/diarrhea;Swollen arms/legs  Physician notified of physical symptoms Yes  Referral to clinical social work Yes  Referral to dietition Yes  Referral to financial advocate Yes   Clinical Social Worker follow up needed: Yes.    If yes, follow up plan: Daughter aware to follow up with CSW team as needed.  Loren Racer, [place] Worker [place] Phone: [phone] Fax: [phone]

## 2014-04-14 ENCOUNTER — Telehealth: Payer: Self-pay | Admitting: *Deleted

## 2014-04-14 NOTE — Telephone Encounter (Signed)
Called patient to ask question, lvm for a return call 

## 2014-04-20 ENCOUNTER — Encounter: Payer: Self-pay | Admitting: *Deleted

## 2014-04-20 ENCOUNTER — Ambulatory Visit: Admission: RE | Admit: 2014-04-20 | Payer: Medicare Other | Source: Ambulatory Visit

## 2014-04-20 ENCOUNTER — Telehealth: Payer: Self-pay | Admitting: *Deleted

## 2014-04-20 ENCOUNTER — Ambulatory Visit: Admission: RE | Admit: 2014-04-20 | Payer: Medicare Other | Source: Ambulatory Visit | Admitting: Radiation Oncology

## 2014-04-20 NOTE — Telephone Encounter (Signed)
Called patient home phone and cell phone, left messages on both, no answer, 7:49 AM

## 2014-04-23 NOTE — Progress Notes (Signed)
Lupron Injection 45 mg on 04/20/14 as ordered by Dr. Alexis Frock  He reports that he is having difficulty paying his rent and does not have enough money to buy food and purchase his medications.

## 2014-04-27 ENCOUNTER — Encounter: Payer: Self-pay | Admitting: Radiation Oncology

## 2014-04-27 ENCOUNTER — Ambulatory Visit
Admission: RE | Admit: 2014-04-27 | Discharge: 2014-04-27 | Disposition: A | Discharge status: Home / Self Care | Payer: Medicare Other | Source: Ambulatory Visit | Attending: Radiation Oncology | Admitting: Radiation Oncology

## 2014-04-27 VITALS — BP 170/74 | HR 61 | Temp 97.9°F | Ht 70.0 in | Wt 158.3 lb

## 2014-04-27 DIAGNOSIS — C61 Malignant neoplasm of prostate: Secondary | ICD-10-CM

## 2014-04-27 NOTE — Addendum Note (Signed)
Encounter addended by: Benn Moulder, RN on: 04/27/2014  5:42 PM<BR>     Documentation filed: Charges VN

## 2014-04-27 NOTE — Progress Notes (Signed)
CC: Dr. 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  I first saw Marcus [name] in consultation on 02/16/2014.   He presented last summer with a markedly elevated PSA of 144. His PSA was 155 on 06/22/2013. His rectal examination showed induration involving most of his left lobe of the prostate. He underwent ultrasound-guided biopsies on 06/30/2013 which showed extensive carcinoma throughout the entire gland. He had Gleason 9 (4+5) involving the left mid and apical biopsies. Other biopsies containing Gleason 8 and Gleason 7. His staging workup including a CAT scan and bone scan were without evidence for metastatic disease. 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 He underwent a robotic prostatectomy and lymphadenectomy on 09/04/2013. He was found to have extensive Gleason 9 (4+5) with a tertiary pattern of 3. He had involvement of both lobes with extraprostatic extension present involving the left and right resection margins and bladder neck margin. Seminal vesicles were not involved. 7 lymph nodes were free of metastatic disease. He has done well postoperatively, and denies urinary incontinence. He does have erectile dysfunction as expected.His PSA in October 2015 was 9.72 rising to 15.79 on 01/25/2014. Repeat CT scans and bone scans on 02/25/2014 were without evidence for metastatic disease.Our plan was to proceed with androgen deprivation therapy along with prostate bed and pelvic nodal radiotherapy.  He was supposed to visit Dr. Tresa Moore for initiation of androgen deprivation therapy in mid February.  He tells that he lost his phone service and did not visit Dr. Tresa Moore until last week on April 5 when he began his androgen deprivation therapy.  He still reports a right testicular/epididymis discomfort along with occasional  discomfort along his right thigh.  He tells me that Dr. Tresa Moore was unsure as to the etiology of his discomfort.  He is doing well from a GU and GI standpoint.  He does wear a diaper but he states that he is dry.  From a social standpoint, he is unable to pay his phone well and he has difficulty appearing his rent without enough money to buy food or purchases medications.  He is to be seen by social work.  He travels by bus.  Physical examination: Alert and oriented. Filed Vitals:   04/27/14 1319  BP: 170/74  Pulse: 61  Temp:    Rectal examination: The prostate bed is flat and is without masses or lesions.  There is slight tenderness on palpation of the right epididymis.  Neurovascular examination is intact.  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  We again discussed the rationale for prostate bed and pelvic radiation therapy.  His prognosis is guarded and our major goal is to give him local regional control of his disease.  We discussed the potential acute and late toxicities of radiation therapy.  We also discussed bladder filling to minimize urinary toxicity.  Consent is signed today.  I do have his daughter's phone number and her name is also Marcus [name], Marcus [name] [phone]).  We can reach Marcus [name] to his daughter.  I would like to check to see if he's had a recent PSA determination.  Plan: Ordinarily, we would wait 2 months before starting radiation therapy, but since he is postop, I feel that we can start within the next 2-3 weeks.  We will get him scheduled through his daughter.

## 2014-04-30 ENCOUNTER — Encounter: Payer: Self-pay | Admitting: *Deleted

## 2014-04-30 NOTE — Progress Notes (Signed)
[place] Psychosocial Distress Screening Clinical Social Work  Clinical Social Work was referred by distress screening protocol.  The patient scored a 6 on the Psychosocial Distress Thermometer which indicates moderate distress. Clinical Social Worker phoned pt's daughter to assess for distress and other psychosocial needs. Daughter, Arnaldo reports she has directed pt to apply for assistance, but he has not followed through. CSW encouraged daughter to possibly take pt to DSS and assist with application process for food stamps and medicaid to help pay for medicare each month. CSW also informed her about possible grant and how to access through financial advocate at [facility]. She agreed to these plans and will follow up. She states transportation is not an issue. She does not live with pt, but lives nearby and tries to help him on a regular basis. Pt's depression is related to these financial concerns and there is now a plan to address these. She provides housing for him, so this is not currently an issue. Daughter agrees to plan and will follow up with CSW as needed.  ONCBCN DISTRESS SCREENING 04/27/2014  Screening Type Change in Status  Distress experienced in past week (1-10) 6  Practical problem type Housing;Food  Family Problem type   Emotional problem type Depression  Spiritual/Religous concerns type   Physical Problem type Sleep/insomnia;Loss of appetitie  Physician notified of physical symptoms Yes  Referral to clinical social work Yes  Referral to dietition   Referral to financial advocate     Clinical Social Worker follow up needed: No.  If yes, follow up plan: Loren Racer, [place]  [facility] Phone: [phone] Fax: [phone]

## 2014-05-18 ENCOUNTER — Ambulatory Visit
Admission: RE | Admit: 2014-05-18 | Discharge: 2014-05-18 | Disposition: A | Discharge status: Home / Self Care | Payer: Medicare Other | Source: Ambulatory Visit | Attending: Radiation Oncology | Admitting: Radiation Oncology

## 2014-05-18 DIAGNOSIS — C61 Malignant neoplasm of prostate: Secondary | ICD-10-CM | POA: Diagnosis not present

## 2014-05-19 NOTE — Progress Notes (Signed)
Complex simulation/treatment planning note: On 05/18/2014 the patient was taken to the CT simulator.  A Vac lock immobilization device was constructed.  A red rubber tube was placed within the rectal vault.  A urethrogram was performed.  He was then scanned.  He had excessive gas within the rectal vault.  We tried to extract this with a Toomey syringe.  This was unsuccessful.  He was then sent to the restroom to see if he could pass gas/move his bowels.  This was only partly successful.  During this time he had loss of bladder filling.  The CT data set was sent to the planning system where contoured his high risk prostate bed (CTV 66).  This was expanded by 0.5 cm to create PTV 66 which will receive 6600 cGy in 33 sessions.  I also contoured his pelvic nodal CTV (CTV 54.45).  This was expanded by 0.5 cm to create PTV 54.45 which will receive 5445 cGy in 33 sessions.  He is be treated with 6 MV photons, Tomotherapy IMRT.  He is now ready for IMRT simulation/treatment planning.

## 2014-05-25 ENCOUNTER — Encounter: Payer: Self-pay | Admitting: Radiation Oncology

## 2014-05-25 DIAGNOSIS — C61 Malignant neoplasm of prostate: Secondary | ICD-10-CM | POA: Diagnosis not present

## 2014-05-25 NOTE — Progress Notes (Signed)
IMRT simulation/treatment planning note: The patient completed IMRT simulation/treatment planning in the management of his carcinoma the prostate.  IMRT was chosen to decrease the risk for both acute and late bladder and rectal toxicity compared to conventional or 3-D conformal radiation therapy.  Dose volume histograms were obtained for the target structures including the prostate bed and also lymphatics within the pelvis in addition to avoidance structures including the bladder, rectum, and femoral heads.  We met our departmental guidelines except for the absolute moderate rectal dose which was compromised because of his nodal targeting.  He will receive 6600 cGy in 33 sessions to his prostate bed/high risk PTV and 5445 cGy and 33 sessions to his nodal PTV.  He is to be treated with 6 MV photons utilizing helical IMRT Tomotherapy.

## 2014-05-26 DIAGNOSIS — C61 Malignant neoplasm of prostate: Secondary | ICD-10-CM | POA: Diagnosis not present

## 2014-05-27 ENCOUNTER — Ambulatory Visit
Admission: RE | Admit: 2014-05-27 | Discharge: 2014-05-27 | Disposition: A | Discharge status: Home / Self Care | Payer: Medicare Other | Source: Ambulatory Visit | Attending: Radiation Oncology | Admitting: Radiation Oncology

## 2014-05-27 DIAGNOSIS — C61 Malignant neoplasm of prostate: Secondary | ICD-10-CM | POA: Diagnosis not present

## 2014-05-28 ENCOUNTER — Ambulatory Visit
Admission: RE | Admit: 2014-05-28 | Discharge: 2014-05-28 | Disposition: A | Discharge status: Home / Self Care | Payer: Medicare Other | Source: Ambulatory Visit | Attending: Radiation Oncology | Admitting: Radiation Oncology

## 2014-05-28 DIAGNOSIS — C61 Malignant neoplasm of prostate: Secondary | ICD-10-CM | POA: Diagnosis not present

## 2014-05-31 ENCOUNTER — Ambulatory Visit
Admission: RE | Admit: 2014-05-31 | Discharge: 2014-05-31 | Disposition: A | Discharge status: Home / Self Care | Payer: Medicare Other | Source: Ambulatory Visit | Attending: Radiation Oncology | Admitting: Radiation Oncology

## 2014-05-31 ENCOUNTER — Encounter: Payer: Self-pay | Admitting: Radiation Oncology

## 2014-05-31 VITALS — BP 161/78 | HR 71 | Temp 98.3°F | Resp 12 | Wt 154.4 lb

## 2014-05-31 DIAGNOSIS — C61 Malignant neoplasm of prostate: Secondary | ICD-10-CM | POA: Diagnosis not present

## 2014-05-31 NOTE — Addendum Note (Signed)
Encounter addended by: Benn Moulder, RN on: 05/31/2014  4:10 PM<BR>     Documentation filed: Inpatient Patient Education, Notes Section

## 2014-05-31 NOTE — Progress Notes (Addendum)
Pt here for patient teaching.  Pt given . Reviewed areas of pertinence such as diarrhea, fatigue, hair loss, sexual and fertility changes, skin changes and urinary and bladder changes . Pt able to give teach back of to pat skin, use unscented/gentle soap, use baby wipes, have Imodium on hand, drink plenty of water and sitz bath,. Pt needs reinforcement of information given and will contact nursing with any questions or concerns. Given this RN's card.  Will reinforce above education on his next clinic visit.

## 2014-05-31 NOTE — Addendum Note (Signed)
Encounter addended by: Benn Moulder, RN on: 05/31/2014  1:31 PM<BR>     Documentation filed: Notes Section, Demographics Visit

## 2014-05-31 NOTE — Progress Notes (Signed)
Weekly Management Note:  Site: Prostate bed/pelvic lymph nodes Current Dose:  600  cGy Projected Dose: 6600  cGy  Narrative: The patient is seen today for routine under treatment assessment. CBCT/MVCT images/port films were reviewed. The chart was reviewed.   Bladder filling is satisfactory.  No new GU or GI difficulties.  Physical Examination:  Filed Vitals:   05/31/14 1036  BP: 161/78  Pulse: 71  Temp: 98.3 F (36.8 C)  Resp: 12  .  Weight: 154 lb 6.4 oz (70.035 kg).  No change.  Impression: Tolerating radiation therapy well.  He will have patient education today.  Plan: Continue radiation therapy as planned.

## 2014-05-31 NOTE — Progress Notes (Signed)
He is currently in no pain.   Pt reports urinary hot flashes. Pt states they urinate 2 - 3 times per night.  Pt reports Constipation a soft bowel movement everyday/everyother day. He reportsit wasn't "very much." Recommeded taking stool softeners and drinking plenty of water.  BP 161/78 mmHg  Pulse 71  Temp(Src) 98.3 F (36.8 C) (Oral)  Resp 12  Wt 154 lb 6.4 oz (70.035 kg)  SpO2 100%

## 2014-06-01 ENCOUNTER — Ambulatory Visit
Admission: RE | Admit: 2014-06-01 | Discharge: 2014-06-01 | Disposition: A | Discharge status: Home / Self Care | Payer: Medicare Other | Source: Ambulatory Visit | Attending: Radiation Oncology | Admitting: Radiation Oncology

## 2014-06-01 DIAGNOSIS — C61 Malignant neoplasm of prostate: Secondary | ICD-10-CM | POA: Diagnosis not present

## 2014-06-02 ENCOUNTER — Ambulatory Visit
Admission: RE | Admit: 2014-06-02 | Discharge: 2014-06-02 | Disposition: A | Discharge status: Home / Self Care | Payer: Medicare Other | Source: Ambulatory Visit | Attending: Radiation Oncology | Admitting: Radiation Oncology

## 2014-06-02 DIAGNOSIS — C61 Malignant neoplasm of prostate: Secondary | ICD-10-CM | POA: Diagnosis not present

## 2014-06-03 ENCOUNTER — Ambulatory Visit
Admission: RE | Admit: 2014-06-03 | Discharge: 2014-06-03 | Disposition: A | Discharge status: Home / Self Care | Payer: Medicare Other | Source: Ambulatory Visit | Attending: Radiation Oncology | Admitting: Radiation Oncology

## 2014-06-03 DIAGNOSIS — C61 Malignant neoplasm of prostate: Secondary | ICD-10-CM | POA: Diagnosis not present

## 2014-06-04 ENCOUNTER — Ambulatory Visit
Admission: RE | Admit: 2014-06-04 | Discharge: 2014-06-04 | Disposition: A | Discharge status: Home / Self Care | Payer: Medicare Other | Source: Ambulatory Visit | Attending: Radiation Oncology | Admitting: Radiation Oncology

## 2014-06-04 DIAGNOSIS — C61 Malignant neoplasm of prostate: Secondary | ICD-10-CM | POA: Diagnosis not present

## 2014-06-07 ENCOUNTER — Ambulatory Visit
Admission: RE | Admit: 2014-06-07 | Discharge: 2014-06-07 | Disposition: A | Discharge status: Home / Self Care | Payer: Medicare Other | Source: Ambulatory Visit | Attending: Radiation Oncology | Admitting: Radiation Oncology

## 2014-06-07 ENCOUNTER — Encounter: Payer: Self-pay | Admitting: Radiation Oncology

## 2014-06-07 VITALS — BP 148/86 | HR 68 | Temp 98.2°F | Ht 70.0 in | Wt 158.0 lb

## 2014-06-07 DIAGNOSIS — C61 Malignant neoplasm of prostate: Secondary | ICD-10-CM | POA: Diagnosis not present

## 2014-06-07 NOTE — Addendum Note (Signed)
Encounter addended by: Arloa Koh, MD on: 06/07/2014 10:57 AM<BR>     Documentation filed: Notes Section

## 2014-06-07 NOTE — Progress Notes (Addendum)
Weekly Management Note:  Site: Prostate bed/pelvic lymph nodes Current Dose:  1600  cGy Projected Dose: 6600  cGy  Narrative: The patient is seen today for routine under treatment assessment. CBCT/MVCT images/port films were reviewed. The chart was reviewed.   Bladder filling is satisfactory.  No GU or GI difficulties.  He believes that he got a spider bite along his left leg over the weekend.  He applied alcohol.  Physical Examination:  Filed Vitals:   06/07/14 0957  BP: 148/86  Pulse: 68  Temp: 98.2 F (36.8 C)  .  Weight: 158 lb (71.668 kg).  On inspection of his left mid leg is a small scab without evidence for infection.  This may have been an insect her spider bite.  Impression: Tolerating radiation therapy well.  He is to let us know if he develops any headaches, muscle aches, fever or rash.  Plan: Continue radiation therapy as planned.

## 2014-06-07 NOTE — Progress Notes (Signed)
Marcus [name] has received 8 fractions to his prostate. He reports good urinary control, complete emptying with each void.  He admits to urgency and nocturia x 3.  He denies any proctitis or dysuria.

## 2014-06-08 ENCOUNTER — Ambulatory Visit
Admission: RE | Admit: 2014-06-08 | Discharge: 2014-06-08 | Disposition: A | Discharge status: Home / Self Care | Payer: Medicare Other | Source: Ambulatory Visit | Attending: Radiation Oncology | Admitting: Radiation Oncology

## 2014-06-08 DIAGNOSIS — C61 Malignant neoplasm of prostate: Secondary | ICD-10-CM | POA: Diagnosis not present

## 2014-06-09 ENCOUNTER — Ambulatory Visit
Admission: RE | Admit: 2014-06-09 | Discharge: 2014-06-09 | Disposition: A | Discharge status: Home / Self Care | Payer: Medicare Other | Source: Ambulatory Visit | Attending: Radiation Oncology | Admitting: Radiation Oncology

## 2014-06-09 DIAGNOSIS — C61 Malignant neoplasm of prostate: Secondary | ICD-10-CM | POA: Diagnosis not present

## 2014-06-10 ENCOUNTER — Ambulatory Visit
Admission: RE | Admit: 2014-06-10 | Discharge: 2014-06-10 | Disposition: A | Discharge status: Home / Self Care | Payer: Medicare Other | Source: Ambulatory Visit | Attending: Radiation Oncology | Admitting: Radiation Oncology

## 2014-06-10 DIAGNOSIS — C61 Malignant neoplasm of prostate: Secondary | ICD-10-CM | POA: Diagnosis not present

## 2014-06-11 ENCOUNTER — Ambulatory Visit
Admission: RE | Admit: 2014-06-11 | Discharge: 2014-06-11 | Disposition: A | Discharge status: Home / Self Care | Payer: Medicare Other | Source: Ambulatory Visit | Attending: Radiation Oncology | Admitting: Radiation Oncology

## 2014-06-11 DIAGNOSIS — C61 Malignant neoplasm of prostate: Secondary | ICD-10-CM | POA: Diagnosis not present

## 2014-06-15 ENCOUNTER — Encounter: Payer: Self-pay | Admitting: Radiation Oncology

## 2014-06-15 ENCOUNTER — Ambulatory Visit
Admission: RE | Admit: 2014-06-15 | Discharge: 2014-06-15 | Disposition: A | Discharge status: Home / Self Care | Payer: Medicare Other | Source: Ambulatory Visit | Attending: Radiation Oncology | Admitting: Radiation Oncology

## 2014-06-15 VITALS — BP 166/76 | HR 64 | Temp 98.0°F | Resp 16 | Ht 70.0 in | Wt 152.7 lb

## 2014-06-15 DIAGNOSIS — C61 Malignant neoplasm of prostate: Secondary | ICD-10-CM | POA: Diagnosis not present

## 2014-06-15 NOTE — Progress Notes (Signed)
Marcus [name] has completed 13 fractiosn to his prostate.  He continues to have pain at night in his right upper leg and his left calf when walking.  He reports having burning with urination that started 2 days ago.  He reports nocturia 3 times per night.  He denies an increase in urinary frequency.  He reports his bowel movements have not been solid because he has been drinking apple juice.  He denies fatigue.  BP 166/76 mmHg  Pulse 64  Temp(Src) 98 F (36.7 C) (Oral)  Resp 16  Ht 5\' 10"  (1.778 m)  Wt 152 lb 11.2 oz (69.264 kg)  BMI 21.91 kg/m2

## 2014-06-15 NOTE — Progress Notes (Signed)
Weekly Management Note:  Site: Prostate bed/pelvic lymph nodes Current Dose:  2600  cGy Projected Dose: 6600  cGy  Narrative: The patient is seen today for routine under treatment assessment. CBCT/MVCT images/port films were reviewed. The chart was reviewed.   Bladder filling was still suboptimal today.  He did not feel that his bladder was filled to capacity.  He does report more urinary frequency along with nocturia 3.  He has slight dysuria which began 2 days ago.  Physical Examination:  Filed Vitals:   06/15/14 0843  BP: 166/76  Pulse: 64  Temp: 98 F (36.7 C)  Resp: 16  .  Weight: 152 lb 11.2 oz (69.264 kg).  No change.  Impression: Tolerating radiation therapy well.  I again encouraged him to improve his bladder filling.  Plan: Continue radiation therapy as planned.

## 2014-06-16 ENCOUNTER — Ambulatory Visit
Admission: RE | Admit: 2014-06-16 | Discharge: 2014-06-16 | Disposition: A | Discharge status: Home / Self Care | Payer: Medicare Other | Source: Ambulatory Visit | Attending: Radiation Oncology | Admitting: Radiation Oncology

## 2014-06-16 DIAGNOSIS — C61 Malignant neoplasm of prostate: Secondary | ICD-10-CM | POA: Diagnosis not present

## 2014-06-17 ENCOUNTER — Ambulatory Visit
Admission: RE | Admit: 2014-06-17 | Discharge: 2014-06-17 | Disposition: A | Discharge status: Home / Self Care | Payer: Medicare Other | Source: Ambulatory Visit | Attending: Radiation Oncology | Admitting: Radiation Oncology

## 2014-06-17 DIAGNOSIS — C61 Malignant neoplasm of prostate: Secondary | ICD-10-CM | POA: Diagnosis not present

## 2014-06-18 ENCOUNTER — Ambulatory Visit
Admission: RE | Admit: 2014-06-18 | Discharge: 2014-06-18 | Disposition: A | Discharge status: Home / Self Care | Payer: Medicare Other | Source: Ambulatory Visit | Attending: Radiation Oncology | Admitting: Radiation Oncology

## 2014-06-18 DIAGNOSIS — C61 Malignant neoplasm of prostate: Secondary | ICD-10-CM | POA: Diagnosis not present

## 2014-06-18 NOTE — Addendum Note (Signed)
Encounter addended by: Benn Moulder, RN on: 06/18/2014  9:21 AM<BR>     Documentation filed: Demographics Visit

## 2014-06-21 ENCOUNTER — Ambulatory Visit
Admission: RE | Admit: 2014-06-21 | Discharge: 2014-06-21 | Disposition: A | Discharge status: Home / Self Care | Payer: Medicare Other | Source: Ambulatory Visit | Attending: Radiation Oncology | Admitting: Radiation Oncology

## 2014-06-21 ENCOUNTER — Ambulatory Visit: Admission: RE | Admit: 2014-06-21 | Payer: Medicare Other | Source: Ambulatory Visit

## 2014-06-21 VITALS — Wt 153.7 lb

## 2014-06-21 DIAGNOSIS — C61 Malignant neoplasm of prostate: Secondary | ICD-10-CM

## 2014-06-21 NOTE — Progress Notes (Signed)
Weight stable. Reports dysuria. Denies hematuria. Reports nocturia x 5.  Report he alternates from diarrhea to constipation. Denies fatigue.

## 2014-06-21 NOTE — Progress Notes (Signed)
Weekly Management Note:  Site: Prostate bed/pelvic lymph nodes Current Dose:  3200  cGy Projected Dose: 6600  cGy  Narrative: The patient is seen today for routine under treatment assessment. CBCT/MVCT images/port films were reviewed. The chart was reviewed.   Marcus [name] is been having difficulty with his bladder filling because of dysuria and urinary urgency.  He tells me that he had 1 episode of rectal bleeding last week and he is a poor episodes before beginning radiation therapy.  His bowels are occasionally loose but he is not really having difficulty with constipation.  He takes a bus to and from treatment and he is concerned about "having an accident" on the bus.  He is not treated today and because of machine malfunction.   Physical Examination: There were no vitals filed for this visit..  Weight: 153 lb 11.2 oz (69.718 kg).  No change.  Impression: Tolerating radiation therapy well except for dysuria and urinary frequency/urgency.  Trying to keep it comfortable full bladder is difficult with probable transportation.  He is trying to arrive early and then drink fluids but this is not working very well.  If he is unable to keep his bladder full that we may have to modify our dose prescription.  Plan: Continue radiation therapy as planned.

## 2014-06-22 ENCOUNTER — Ambulatory Visit
Admission: RE | Admit: 2014-06-22 | Discharge: 2014-06-22 | Disposition: A | Discharge status: Home / Self Care | Payer: Medicare Other | Source: Ambulatory Visit | Attending: Radiation Oncology | Admitting: Radiation Oncology

## 2014-06-22 DIAGNOSIS — C61 Malignant neoplasm of prostate: Secondary | ICD-10-CM | POA: Diagnosis not present

## 2014-06-23 ENCOUNTER — Ambulatory Visit
Admission: RE | Admit: 2014-06-23 | Discharge: 2014-06-23 | Disposition: A | Discharge status: Home / Self Care | Payer: Medicare Other | Source: Ambulatory Visit | Attending: Radiation Oncology | Admitting: Radiation Oncology

## 2014-06-23 DIAGNOSIS — C61 Malignant neoplasm of prostate: Secondary | ICD-10-CM | POA: Diagnosis not present

## 2014-06-24 ENCOUNTER — Ambulatory Visit
Admission: RE | Admit: 2014-06-24 | Discharge: 2014-06-24 | Disposition: A | Discharge status: Home / Self Care | Payer: Medicare Other | Source: Ambulatory Visit | Attending: Radiation Oncology | Admitting: Radiation Oncology

## 2014-06-24 DIAGNOSIS — C61 Malignant neoplasm of prostate: Secondary | ICD-10-CM | POA: Diagnosis not present

## 2014-06-25 ENCOUNTER — Ambulatory Visit
Admission: RE | Admit: 2014-06-25 | Discharge: 2014-06-25 | Disposition: A | Discharge status: Home / Self Care | Payer: Medicare Other | Source: Ambulatory Visit | Attending: Radiation Oncology | Admitting: Radiation Oncology

## 2014-06-25 DIAGNOSIS — C61 Malignant neoplasm of prostate: Secondary | ICD-10-CM | POA: Diagnosis not present

## 2014-06-28 ENCOUNTER — Ambulatory Visit
Admission: RE | Admit: 2014-06-28 | Discharge: 2014-06-28 | Disposition: A | Discharge status: Home / Self Care | Payer: Medicare Other | Source: Ambulatory Visit | Attending: Radiation Oncology | Admitting: Radiation Oncology

## 2014-06-28 ENCOUNTER — Encounter: Payer: Self-pay | Admitting: Radiation Oncology

## 2014-06-28 VITALS — BP 167/71 | HR 57 | Temp 97.8°F | Resp 16 | Ht 70.0 in | Wt 154.9 lb

## 2014-06-28 DIAGNOSIS — C61 Malignant neoplasm of prostate: Secondary | ICD-10-CM | POA: Diagnosis not present

## 2014-06-28 MED ORDER — HYDROCORTISONE 100 MG/60ML RE ENEM: 1.0000 | ENEMA | Freq: Every day | RECTAL | Status: DC

## 2014-06-28 NOTE — Progress Notes (Signed)
Marcus [name] has completed 21 fractions to his prostate.  He continues to have muscle pain in his right groin and upper leg.  He reports he had this pain before radiation started.  He says it is worse with activity.  He reports having dysuria that started this weekend.  He denies an increase in urinary frequency.  He is getting up 3 times per night to urinate.  He reports having 1 loose stool per day and did have rectal bleeding on Saturday and Sunday.  He reports he saw bright red blood in the toilet.  He reports fatigue.  He denies skin irritation.  BP 167/71 mmHg  Pulse 57  Temp(Src) 97.8 F (36.6 C) (Oral)  Resp 16  Ht 5\' 10"  (1.778 m)  Wt 154 lb 14.4 oz (70.262 kg)  BMI 22.23 kg/m2

## 2014-06-28 NOTE — Progress Notes (Signed)
   Weekly Management Note:  Outpatient    ICD-9-CM ICD-10-CM   1. Prostate cancer 185 C61     Current Dose:  42 Gy  Projected Dose: 66 Gy   Narrative:  The patient presents for routine under treatment assessment.  CBCT/MVCT images/Port film x-rays were reviewed.  The chart was checked.  Marcus [name] has completed 21 fractions to his prostate. He continues to have muscle pain in his right groin and upper leg. He reports he had this pain before radiation started. He says it is worse with activity. He reports having dysuria that started this weekend. He denies an increase in urinary frequency. He is getting up 3 times per night to urinate. He reports having 1 loose stool per day and did have rectal bleeding on Saturday and Sunday. He reports he saw bright red blood in the toilet. He denies pain when he moves his bowels. He is having blood in stool about once a week that he feels is dependent on what he eats. He denies hemorrhoids. He reports fatigue. He denies skin irritation. He has been having hot flashes due to the Lupron. He has been experiencing appetite loss.    Physical Findings:  height is 5\' 10"  (1.778 m) and weight is 154 lb 14.4 oz (70.262 kg). His oral temperature is 97.8 F (36.6 C). His blood pressure is 167/71 and his pulse is 57. His respiration is 16.   Wt Readings from Last 3 Encounters:  06/28/14 154 lb 14.4 oz (70.262 kg)  06/21/14 153 lb 11.2 oz (69.718 kg)  06/15/14 152 lb 11.2 oz (69.264 kg)   NAD   Impression:  The patient is tolerating radiotherapy.  Plan:  Continue radiotherapy as planned. Prescribed one week of hydrocortisone enemas to use as needed to help decrease inflammation of the rectum and help with the bleeding.    This document serves as a record of services personally performed by Eppie Gibson, MD. It was created on her behalf by Arlyce Harman, a trained medical scribe. The creation of this record is based on the scribe's personal observations and the  provider's statements to them. This document has been checked and approved by the attending provider. ________________________________   Eppie Gibson, M.D.

## 2014-06-29 ENCOUNTER — Ambulatory Visit
Admission: RE | Admit: 2014-06-29 | Discharge: 2014-06-29 | Disposition: A | Discharge status: Home / Self Care | Payer: Medicare Other | Source: Ambulatory Visit | Attending: Radiation Oncology | Admitting: Radiation Oncology

## 2014-06-29 DIAGNOSIS — C61 Malignant neoplasm of prostate: Secondary | ICD-10-CM | POA: Diagnosis not present

## 2014-06-30 ENCOUNTER — Ambulatory Visit
Admission: RE | Admit: 2014-06-30 | Discharge: 2014-06-30 | Disposition: A | Discharge status: Home / Self Care | Payer: Medicare Other | Source: Ambulatory Visit | Attending: Radiation Oncology | Admitting: Radiation Oncology

## 2014-06-30 DIAGNOSIS — C61 Malignant neoplasm of prostate: Secondary | ICD-10-CM | POA: Diagnosis not present

## 2014-07-01 ENCOUNTER — Ambulatory Visit
Admission: RE | Admit: 2014-07-01 | Discharge: 2014-07-01 | Disposition: A | Discharge status: Home / Self Care | Payer: Medicare Other | Source: Ambulatory Visit | Attending: Radiation Oncology | Admitting: Radiation Oncology

## 2014-07-01 DIAGNOSIS — C61 Malignant neoplasm of prostate: Secondary | ICD-10-CM | POA: Diagnosis not present

## 2014-07-02 ENCOUNTER — Ambulatory Visit
Admission: RE | Admit: 2014-07-02 | Discharge: 2014-07-02 | Disposition: A | Discharge status: Home / Self Care | Payer: Medicare Other | Source: Ambulatory Visit | Attending: Radiation Oncology | Admitting: Radiation Oncology

## 2014-07-02 DIAGNOSIS — C61 Malignant neoplasm of prostate: Secondary | ICD-10-CM | POA: Diagnosis not present

## 2014-07-05 ENCOUNTER — Encounter: Payer: Self-pay | Admitting: Radiation Oncology

## 2014-07-05 ENCOUNTER — Ambulatory Visit
Admission: RE | Admit: 2014-07-05 | Discharge: 2014-07-05 | Disposition: A | Discharge status: Home / Self Care | Payer: Medicare Other | Source: Ambulatory Visit | Attending: Radiation Oncology | Admitting: Radiation Oncology

## 2014-07-05 VITALS — BP 151/71 | HR 72 | Temp 98.1°F | Resp 16 | Wt 153.5 lb

## 2014-07-05 DIAGNOSIS — C61 Malignant neoplasm of prostate: Secondary | ICD-10-CM

## 2014-07-05 NOTE — Progress Notes (Signed)
Weight and vitals stable. Reports nocturia x 3. Reports dysuria. Reports loose bowels over the weekend. Reports fatigue. Denies urgency or frequency. Denies pain. Denies incontinence.  BP 151/71 mmHg  Pulse 72  Resp 16  Wt 153 lb 8 oz (69.627 kg) Wt Readings from Last 3 Encounters:  07/05/14 153 lb 8 oz (69.627 kg)  06/28/14 154 lb 14.4 oz (70.262 kg)  06/21/14 153 lb 11.2 oz (69.718 kg)

## 2014-07-05 NOTE — Progress Notes (Signed)
Weekly Management Note:  Site: Prostate bed/pelvic lymph nodes Current Dose:  5200  cGy Projected Dose: 6600  cGy  Narrative: The patient is seen today for routine under treatment assessment. CBCT/MVCT images/port films were reviewed. The chart was reviewed.   Treatment is delayed today because of machine malfunction.  I will check his bladder filling later today.  He does report nocturia 3 along with some mild dysuria.  He has some loose bowels over the weekend.  I have been concerned about his bladder filling.  Physical Examination:  Filed Vitals:   07/05/14 0750  BP: 151/71  Pulse: 72  Temp: 98.1 F (36.7 C)  Resp: 16  .  Weight: 153 lb 8 oz (69.627 kg).  Change.  Impression: Tolerating radiation therapy well, although I am concerned about radiation cystitis.  I'll check his bladder filling later today.  Plan: Continue radiation therapy as planned.

## 2014-07-06 ENCOUNTER — Ambulatory Visit
Admission: RE | Admit: 2014-07-06 | Discharge: 2014-07-06 | Disposition: A | Discharge status: Home / Self Care | Payer: Medicare Other | Source: Ambulatory Visit | Attending: Radiation Oncology | Admitting: Radiation Oncology

## 2014-07-06 DIAGNOSIS — C61 Malignant neoplasm of prostate: Secondary | ICD-10-CM | POA: Diagnosis not present

## 2014-07-07 ENCOUNTER — Ambulatory Visit
Admission: RE | Admit: 2014-07-07 | Discharge: 2014-07-07 | Disposition: A | Discharge status: Home / Self Care | Payer: Medicare Other | Source: Ambulatory Visit | Attending: Radiation Oncology | Admitting: Radiation Oncology

## 2014-07-07 DIAGNOSIS — C61 Malignant neoplasm of prostate: Secondary | ICD-10-CM | POA: Diagnosis not present

## 2014-07-08 ENCOUNTER — Ambulatory Visit
Admission: RE | Admit: 2014-07-08 | Discharge: 2014-07-08 | Disposition: A | Discharge status: Home / Self Care | Payer: Medicare Other | Source: Ambulatory Visit | Attending: Radiation Oncology | Admitting: Radiation Oncology

## 2014-07-08 DIAGNOSIS — C61 Malignant neoplasm of prostate: Secondary | ICD-10-CM | POA: Diagnosis not present

## 2014-07-09 ENCOUNTER — Ambulatory Visit
Admission: RE | Admit: 2014-07-09 | Discharge: 2014-07-09 | Disposition: A | Discharge status: Home / Self Care | Payer: Medicare Other | Source: Ambulatory Visit | Attending: Radiation Oncology | Admitting: Radiation Oncology

## 2014-07-09 ENCOUNTER — Encounter: Payer: Self-pay | Admitting: Radiation Oncology

## 2014-07-09 VITALS — BP 128/82 | HR 78 | Temp 98.6°F | Resp 16 | Ht 70.0 in | Wt 150.7 lb

## 2014-07-09 DIAGNOSIS — R300 Dysuria: Secondary | ICD-10-CM | POA: Insufficient documentation

## 2014-07-09 DIAGNOSIS — C61 Malignant neoplasm of prostate: Secondary | ICD-10-CM | POA: Diagnosis not present

## 2014-07-09 LAB — URINALYSIS, MICROSCOPIC - CHCC
BILIRUBIN (URINE): NEGATIVE
Glucose: NEGATIVE mg/dL
KETONES: NEGATIVE mg/dL
LEUKOCYTE ESTERASE: NEGATIVE
Nitrite: NEGATIVE
Protein: NEGATIVE mg/dL
Specific Gravity, Urine: 1.01 (ref 1.003–1.035)
Urobilinogen, UR: 0.2 mg/dL (ref 0.2–1)
pH: 6 (ref 4.6–8.0)

## 2014-07-09 NOTE — Progress Notes (Signed)
  Radiation Oncology         [phone]   Name: Marcus [name] MRN: [id number]   Date: 07/09/2014  DOB: 1945/02/09   Weekly Radiation Therapy Management    ICD-9-CM ICD-10-CM   1. Dysuria 788.1 R30.0 Urinalysis, Microscopic - [facility]     Urine culture    Current Dose: 60 Gy  Planned Dose:  66 Gy  Narrative The patient presents for routine under treatment assessment. Marcus [name] has completed 30 fractions to his prostate. He reports having "stinging" with urination that started this morning. He also reports seeing a small amount of bright red blood. He has urinated since and has not seen any blood. He reports he was working outside yesterday and almost passed out. He thinks he was dehydrated. He has lost 3 lbs since 6/20. He reports having 1 episode of diarrhea yesterday. He reports nocturia x 2. He reports fatigue. The patient is without complaint. Set-up films were reviewed. The chart was checked.  Physical Findings  height is 5\' 10"  (1.778 m) and weight is 150 lb 11.2 oz (68.357 kg). His oral temperature is 98.6 F (37 C). His blood pressure is 128/82 and his pulse is 78. His respiration is 16. . Weight essentially stable.  No significant changes.  Impression The patient is tolerating radiation.  Plan Continue treatment as planned. I will order a urinalysis and culture.  I encouraged aggressive hydration   This document serves as a record of services personally performed by Marcus Pita, MD. It was created on his behalf by Marcus [name], a trained medical scribe. The creation of this record is based on the scribe's personal observations and the provider's statements to them. This document has been checked and approved by the attending provider.      Marcus [name], M.D.

## 2014-07-09 NOTE — Progress Notes (Signed)
Marcus [name] has completed 30 fractions to his prostate.  He reports having "stinging" with urination that started this morning.  He also reports seeing a small amount of bright red blood, "like a string in his urine this morning."  He has urinated since and has not seen any blood.  He reports he was working outside yesterday and almost passed out.  He thinks he was dehydrated.  He has lost 3 lbs since 6/20.  He reports having 1 episode of diarrhea yesterday.  He reports nocturia x 2.  He reports fatigue.  BP 128/82 mmHg  Pulse 78  Temp(Src) 98.6 F (37 C) (Oral)  Resp 16  Ht 5\' 10"  (1.778 m)  Wt 150 lb 11.2 oz (68.357 kg)  BMI 21.62 kg/m2

## 2014-07-10 LAB — URINE CULTURE

## 2014-07-12 ENCOUNTER — Ambulatory Visit: Admission: RE | Admit: 2014-07-12 | Payer: Medicare Other | Source: Ambulatory Visit

## 2014-07-12 ENCOUNTER — Encounter: Payer: Self-pay | Admitting: Radiation Oncology

## 2014-07-12 ENCOUNTER — Ambulatory Visit
Admission: RE | Admit: 2014-07-12 | Discharge: 2014-07-12 | Disposition: A | Discharge status: Home / Self Care | Payer: Medicare Other | Source: Ambulatory Visit | Attending: Radiation Oncology | Admitting: Radiation Oncology

## 2014-07-12 VITALS — BP 161/74 | HR 64 | Temp 97.8°F | Resp 20 | Wt 153.1 lb

## 2014-07-12 DIAGNOSIS — C61 Malignant neoplasm of prostate: Secondary | ICD-10-CM

## 2014-07-12 NOTE — Progress Notes (Signed)
Weekly Management Note:  Site: Prostate bed/pelvic lymph nodes Current Dose:  6000  cGy Projected Dose: 6600  cGy  Narrative: The patient is seen today for routine under treatment assessment. CBCT/MVCT images/port films were reviewed. The chart was reviewed.   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  His gross hematuria has resolved.  He is also having diarrhea with 2 episodes this morning.  This could also be due to inadequate bladder filling.  I think it would be appropriate to stop his radiation therapy at this time.  Physical Examination: There were no vitals filed for this visit..  Weight:  .  No change.  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  We will stop his radiation therapy.  Plan: Follow-up visit in one month.

## 2014-07-12 NOTE — Progress Notes (Signed)
Chart note: The patient therapy underwent Tomotherapy segmentation on 05/27/2014.  He was treated with 9.4 delivered field widths corresponding to one set of IMRT treatment devices [phone]).

## 2014-07-12 NOTE — Progress Notes (Signed)
[facility] End of Treatment Note  Name:Marcus [name]  Date: 07/12/2014 FRT:[id number] DOB:07-May-1945   Status:outpatient    CC: Vickii Penna., MD  Dr. Phebe Colla  REFERRING PHYSICIAN: Dr. Phebe Colla   DIAGNOSIS: Pathologic stage T3b high risk adenocarcinoma prostate   INDICATION FOR TREATMENT: Curative   TREATMENT DATES: 05/27/2014 through 07/09/2014                          SITE/DOSE:  Prostate bed 6000 cGy in 30 sessions, pelvic lymph nodes 4950 cGy in 30 sessions                       BEAMS/ENERGY:  6 MV photons, helical IMRT                 NARRATIVE:   [name] had a difficult time with bladder filling during his course of treatment.  This resulted in acute radiation cystitis with a few episodes of gross hematuria.  He also had a couple of episodes of rectal bleeding which has resolved.  I recommended that we stop his treatment at 6000 cGy, short of the prescribed dose of 6600 cGy to avoid chronic radiation cystitis.                         PLAN: Routine followup in one month. Patient instructed to call if questions or worsening complaints in interim.

## 2014-07-12 NOTE — Progress Notes (Addendum)
Weekly rad txs seen before treatment todayy has had 30  For prostate, still smokes 3 cigarettes daily, has nocturia x3, slight dysuria, and having diarrhea, has had 2 episodes this am stated, appetite good, still has hot flashes, Lupron every 6 months 8:05 AM BP 161/74 mmHg  Pulse 64  Temp(Src) 97.8 F (36.6 C) (Oral)  Resp 20  Wt 153 lb 1.6 oz (69.446 kg)  Wt Readings from Last 3 Encounters:  07/12/14 153 lb 1.6 oz (69.446 kg)  07/09/14 150 lb 11.2 oz (68.357 kg)  07/05/14 153 lb 8 oz (69.627 kg)  Gaspar Garbe, RN II Rad/Onc

## 2014-07-12 NOTE — Progress Notes (Signed)
She notes from earlier today.

## 2014-07-13 ENCOUNTER — Ambulatory Visit: Payer: Medicare Other

## 2014-07-14 ENCOUNTER — Ambulatory Visit: Payer: Medicare Other

## 2015-09-30 LAB — GLUCOSE, POCT (MANUAL RESULT ENTRY): POC Glucose: 127 mg/dl — AB (ref 70–99)

## 2015-10-05 ENCOUNTER — Other Ambulatory Visit (HOSPITAL_COMMUNITY): Payer: Self-pay | Admitting: Internal Medicine

## 2015-10-05 ENCOUNTER — Ambulatory Visit (HOSPITAL_COMMUNITY)
Admission: RE | Admit: 2015-10-05 | Discharge: 2015-10-05 | Disposition: A | Discharge status: Home / Self Care | Payer: Medicare HMO | Source: Ambulatory Visit | Attending: Internal Medicine | Admitting: Internal Medicine

## 2015-10-05 DIAGNOSIS — R52 Pain, unspecified: Secondary | ICD-10-CM | POA: Insufficient documentation

## 2015-10-05 DIAGNOSIS — M160 Bilateral primary osteoarthritis of hip: Secondary | ICD-10-CM | POA: Diagnosis not present

## 2015-10-05 DIAGNOSIS — I739 Peripheral vascular disease, unspecified: Secondary | ICD-10-CM | POA: Diagnosis not present

## 2015-10-05 DIAGNOSIS — M889 Osteitis deformans of unspecified bone: Secondary | ICD-10-CM | POA: Diagnosis not present

## 2016-05-03 ENCOUNTER — Encounter (HOSPITAL_COMMUNITY): Payer: Self-pay | Admitting: *Deleted

## 2016-05-07 NOTE — Patient Instructions (Addendum)
Marcus [name]  05/07/2016   Your procedure is scheduled on: 05/15/2016    Report to [facility] Main  Entrance and follow the signs to the [place] on the first floor at 0515am.      Call this number if you have problems the morning of surgery  [phone]   Remember: ONLY 1 PERSON MAY GO WITH YOU TO SHORT STAY TO GET  READY MORNING OF [place].  Do not eat food or drink liquids :After Midnight.     Take these medicines the morning of surgery with A SIP OF WATER: none                                 You may not have any metal on your body including hair pins and              piercings  Do not wear jewelry, , lotions, powders or perfumes, deodorant                        Men may shave face and neck.   Do not bring valuables to the hospital. [place].  Contacts, dentures or bridgework may not be worn into surgery.  Leave suitcase in the car. After surgery it may be brought to your room.       Special Instructions: N/A              Please read over the following fact sheets you were given: _____________________________________________________________________             [facility] - Preparing for Surgery Before surgery, you can play an important role.  Because skin is not sterile, your skin needs to be as free of germs as possible.  You can reduce the number of germs on your skin by washing with CHG (chlorahexidine gluconate) soap before surgery.  CHG is an antiseptic cleaner which kills germs and bonds with the skin to continue killing germs even after washing. Please DO NOT use if you have an allergy to CHG or antibacterial soaps.  If your skin becomes reddened/irritated stop using the CHG and inform your nurse when you arrive at Short Stay. Do not shave (including legs and underarms) for at least 48 hours prior to the first CHG shower.  You may shave your face/neck. Please follow these  instructions carefully:  1.  Shower with CHG Soap the night before surgery and the  morning of Surgery.  2.  If you choose to wash your hair, wash your hair first as usual with your  normal  shampoo.  3.  After you shampoo, rinse your hair and body thoroughly to remove the  shampoo.                           4.  Use CHG as you would any other liquid soap.  You can apply chg directly  to the skin and wash                       Gently with a scrungie or clean washcloth.  5.  Apply the CHG Soap to your body  ONLY FROM THE NECK DOWN.   Do not use on face/ open                           Wound or open sores. Avoid contact with eyes, ears mouth and genitals (private parts).                       Wash face,  Genitals (private parts) with your normal soap.             6.  Wash thoroughly, paying special attention to the area where your surgery  will be performed.  7.  Thoroughly rinse your body with warm water from the neck down.  8.  DO NOT shower/wash with your normal soap after using and rinsing off  the CHG Soap.                9.  Pat yourself dry with a clean towel.            10.  Wear clean pajamas.            11.  Place clean sheets on your bed the night of your first shower and do not  sleep with pets. Day of Surgery : Do not apply any lotions/deodorants the morning of surgery.  Please wear clean clothes to the hospital/surgery center.  FAILURE TO FOLLOW THESE INSTRUCTIONS MAY RESULT IN THE CANCELLATION OF YOUR SURGERY PATIENT SIGNATURE_________________________________  NURSE SIGNATURE__________________________________  ________________________________________________________________________  WHAT IS A BLOOD TRANSFUSION? Blood Transfusion Information  A transfusion is the replacement of blood or some of its parts. Blood is made up of multiple cells which provide different functions.  Red blood cells carry oxygen and are used for blood loss replacement.  White blood cells fight against  infection.  Platelets control bleeding.  Plasma helps clot blood.  Other blood products are available for specialized needs, such as hemophilia or other clotting disorders. BEFORE THE TRANSFUSION  Who gives blood for transfusions?   Healthy volunteers who are fully evaluated to make sure their blood is safe. This is blood bank blood. Transfusion therapy is the safest it has ever been in the practice of medicine. Before blood is taken from a donor, a complete history is taken to make sure that person has no history of diseases nor engages in risky social behavior (examples are intravenous drug use or sexual activity with multiple partners). The donor's travel history is screened to minimize risk of transmitting infections, such as malaria. The donated blood is tested for signs of infectious diseases, such as HIV and hepatitis. The blood is then tested to be sure it is compatible with you in order to minimize the chance of a transfusion reaction. If you or a relative donates blood, this is often done in anticipation of surgery and is not appropriate for emergency situations. It takes many days to process the donated blood. RISKS AND COMPLICATIONS Although transfusion therapy is very safe and saves many lives, the main dangers of transfusion include:   Getting an infectious disease.  Developing a transfusion reaction. This is an allergic reaction to something in the blood you were given. Every precaution is taken to prevent this. The decision to have a blood transfusion has been considered carefully by your caregiver before blood is given. Blood is not given unless the benefits outweigh the risks. AFTER THE TRANSFUSION  Right after receiving a blood transfusion, you will usually feel much  better and more energetic. This is especially true if your red blood cells have gotten low (anemic). The transfusion raises the level of the red blood cells which carry oxygen, and this usually causes an energy  increase.  The nurse administering the transfusion will monitor you carefully for complications. HOME CARE INSTRUCTIONS  No special instructions are needed after a transfusion. You may find your energy is better. Speak with your caregiver about any limitations on activity for underlying diseases you may have. SEEK MEDICAL CARE IF:   Your condition is not improving after your transfusion.  You develop redness or irritation at the intravenous (IV) site. SEEK IMMEDIATE MEDICAL CARE IF:  Any of the following symptoms occur over the next 12 hours:  Shaking chills.  You have a temperature by mouth above 102 F (38.9 C), not controlled by medicine.  Chest, back, or muscle pain.  People around you feel you are not acting correctly or are confused.  Shortness of breath or difficulty breathing.  Dizziness and fainting.  You get a rash or develop hives.  You have a decrease in urine output.  Your urine turns a dark color or changes to pink, red, or brown. Any of the following symptoms occur over the next 10 days:  You have a temperature by mouth above 102 F (38.9 C), not controlled by medicine.  Shortness of breath.  Weakness after normal activity.  The white part of the eye turns yellow (jaundice).  You have a decrease in the amount of urine or are urinating less often.  Your urine turns a dark color or changes to pink, red, or brown. Document Released: 12/30/1999 Document Revised: 03/26/2011 Document Reviewed: 08/18/2007 ExitCare Patient Information 2014 [place].  _______________________________________________________________________  Incentive Spirometer  An incentive spirometer is a tool that can help keep your lungs clear and active. This tool measures how well you are filling your lungs with each breath. Taking long deep breaths may help reverse or decrease the chance of developing breathing (pulmonary) problems (especially infection) following:  A long  period of time when you are unable to move or be active. BEFORE THE PROCEDURE   If the spirometer includes an indicator to show your best effort, your nurse or respiratory therapist will set it to a desired goal.  If possible, sit up straight or lean slightly forward. Try not to slouch.  Hold the incentive spirometer in an upright position. INSTRUCTIONS FOR USE  1. Sit on the edge of your bed if possible, or sit up as far as you can in bed or on a chair. 2. Hold the incentive spirometer in an upright position. 3. Breathe out normally. 4. Place the mouthpiece in your mouth and seal your lips tightly around it. 5. Breathe in slowly and as deeply as possible, raising the piston or the ball toward the top of the column. 6. Hold your breath for 3-5 seconds or for as long as possible. Allow the piston or ball to fall to the bottom of the column. 7. Remove the mouthpiece from your mouth and breathe out normally. 8. Rest for a few seconds and repeat Steps 1 through 7 at least 10 times every 1-2 hours when you are awake. Take your time and take a few normal breaths between deep breaths. 9. The spirometer may include an indicator to show your best effort. Use the indicator as a goal to work toward during each repetition. 10. After each set of 10 deep breaths, practice coughing to be sure  your lungs are clear. If you have an incision (the cut made at the time of surgery), support your incision when coughing by placing a pillow or rolled up towels firmly against it. Once you are able to get out of bed, walk around indoors and cough well. You may stop using the incentive spirometer when instructed by your caregiver.  RISKS AND COMPLICATIONS  Take your time so you do not get dizzy or light-headed.  If you are in pain, you may need to take or ask for pain medication before doing incentive spirometry. It is harder to take a deep breath if you are having pain. AFTER USE  Rest and breathe slowly and  easily.  It can be helpful to keep track of a log of your progress. Your caregiver can provide you with a simple table to help with this. If you are using the spirometer at home, follow these instructions: [place] IF:   You are having difficultly using the spirometer.  You have trouble using the spirometer as often as instructed.  Your pain medication is not giving enough relief while using the spirometer.  You develop fever of 100.5 F (38.1 C) or higher. SEEK IMMEDIATE MEDICAL CARE IF:   You cough up bloody sputum that had not been present before.  You develop fever of 102 F (38.9 C) or greater.  You develop worsening pain at or near the incision site. MAKE SURE YOU:   Understand these instructions.  Will watch your condition.  Will get help right away if you are not doing well or get worse. Document Released: 05/14/2006 Document Revised: 03/26/2011 Document Reviewed: 07/15/2006 [facility] : [facility] Patient Information 2014 [place], [place].   ________________________________________________________________________

## 2016-05-09 ENCOUNTER — Encounter (HOSPITAL_COMMUNITY): Payer: Self-pay

## 2016-05-09 ENCOUNTER — Encounter (HOSPITAL_COMMUNITY)
Admission: RE | Admit: 2016-05-09 | Discharge: 2016-05-09 | Disposition: A | Discharge status: Home / Self Care | Payer: Medicare HMO | Source: Ambulatory Visit | Attending: Orthopedic Surgery | Admitting: Orthopedic Surgery

## 2016-05-09 DIAGNOSIS — M1611 Unilateral primary osteoarthritis, right hip: Secondary | ICD-10-CM | POA: Diagnosis not present

## 2016-05-09 DIAGNOSIS — Z01810 Encounter for preprocedural cardiovascular examination: Secondary | ICD-10-CM | POA: Diagnosis present

## 2016-05-09 DIAGNOSIS — R9431 Abnormal electrocardiogram [ECG] [EKG]: Secondary | ICD-10-CM | POA: Insufficient documentation

## 2016-05-09 DIAGNOSIS — Z01812 Encounter for preprocedural laboratory examination: Secondary | ICD-10-CM | POA: Diagnosis not present

## 2016-05-09 HISTORY — DX: Unspecified osteoarthritis, unspecified site: M19.90

## 2016-05-09 LAB — BASIC METABOLIC PANEL
Anion gap: 11 (ref 5–15)
BUN: 14 mg/dL (ref 6–20)
CO2: 24 mmol/L (ref 22–32)
Calcium: 10.1 mg/dL (ref 8.9–10.3)
Chloride: 100 mmol/L — ABNORMAL LOW (ref 101–111)
Creatinine, Ser: 1.14 mg/dL (ref 0.61–1.24)
GFR calc Af Amer: >60 mL/min (ref >60)
GFR calc non Af Amer: >60 mL/min (ref >60)
GLUCOSE: 108 mg/dL — AB (ref 65–99)
POTASSIUM: 4.3 mmol/L (ref 3.5–5.1)
Sodium: 135 mmol/L (ref 135–145)

## 2016-05-09 LAB — SURGICAL PCR SCREEN
MRSA, PCR: NEGATIVE
Staphylococcus aureus: NEGATIVE

## 2016-05-09 LAB — CBC
HEMATOCRIT: 36.8 % — AB (ref 39.0–52.0)
Hemoglobin: 12.6 g/dL — ABNORMAL LOW (ref 13.0–17.0)
MCH: 29.2 pg (ref 26.0–34.0)
MCHC: 34.2 g/dL (ref 30.0–36.0)
MCV: 85.4 fL (ref 78.0–100.0)
Platelets: 190 10*3/uL (ref 150–400)
RBC: 4.31 MIL/uL (ref 4.22–5.81)
RDW: 14.8 % (ref 11.5–15.5)
WBC: 4.7 10*3/uL (ref 4.0–10.5)

## 2016-05-09 NOTE — H&P (Signed)
TOTAL HIP ADMISSION H&P  Patient is admitted for right total hip arthroplasty, anterior approach.  Subjective:  Chief Complaint:      Right hip primary OA / pain  HPI: Marcus [name], 71 y.o. male, has a history of pain and functional disability in the right hip(s) due to arthritis and patient has failed non-surgical conservative treatments for greater than 12 weeks to include NSAID's and/or analgesics, use of assistive devices and activity modification.  Onset of symptoms was gradual starting 2+ years ago with gradually worsening course since that time.The patient noted no past surgery on the right hip(s).  Patient currently rates pain in the right hip at 10 out of 10 with activity. Patient has night pain, worsening of pain with activity and weight bearing, trendelenberg gait, pain that interfers with activities of daily living and pain with passive range of motion. Patient has evidence of periarticular osteophytes and joint space narrowing by imaging studies. This condition presents safety issues increasing the risk of falls.  There is no current active infection.   Risks, benefits and expectations were discussed with the patient.  Risks including but not limited to the risk of anesthesia, blood clots, nerve damage, blood vessel damage, failure of the prosthesis, infection and up to and including death.  Patient understand the risks, benefits and expectations and wishes to proceed with surgery.   PCP: Rogers Blocker, MD  D/C Plans:       Home   Post-op Meds:       No Rx given  Tranexamic Acid:      To be given - IV   Decadron:      Is to be given  FYI:     ASA  [place]  DME:   Pt already has equipment  PT:   No PT    Patient Active Problem List   Diagnosis Date Noted  . Prostate cancer ([place]) 09/04/2013   Past Medical History:  Diagnosis Date  . Arthritis   . Depression   . History of transfusion    AS CHILD  . Hypertension   . Numbness and tingling of right arm   . 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 Prostate cancer ([place]) 09/04/13   Adenocarcinoma    Past Surgical History:  Procedure Laterality Date  . LYMPHADENECTOMY Bilateral 09/04/2013   Procedure: PELVIC LYMPH NODE DISSECTION;  Surgeon: Alexis Frock, MD;  Location: [facility];  Service: Urology;  Laterality: Bilateral;  . ROBOT ASSISTED LAPAROSCOPIC RADICAL PROSTATECTOMY N/A 09/04/2013   Procedure: ROBOTIC ASSISTED LAPAROSCOPIC RADICAL PROSTATECTOMY, INDOCYANINE GREEN DYE;  Surgeon: Alexis Frock, MD;  Location: [facility];  Service: Urology;  Laterality: N/A;  . WRIST GANGLION EXCISION      No prescriptions prior to admission.   No Known Allergies   Social History  Substance Use Topics  . Smoking status: Current Every Day Smoker    Packs/day: 0.50    Types: Cigarettes  . Smokeless tobacco: Never Used  . Alcohol use Yes     Comment: 2- 40 ouonce beers per week     Family History  Problem Relation Age of Onset  . Seizures Mother   . Cancer Father     throat cancer     Review of Systems  Constitutional: Negative.   HENT: Negative.   Eyes: Negative.   Respiratory: Negative.   Cardiovascular: Negative.   Gastrointestinal: Negative.   Genitourinary: Negative.   Musculoskeletal: Positive for joint pain.  Skin:  Negative.   Neurological: Negative.   Endo/Heme/Allergies: Negative.   Psychiatric/Behavioral: Positive for depression.    Objective:  Physical Exam  Constitutional: He is oriented to person, place, and time. He appears well-developed.  HENT:  Head: Normocephalic.  Eyes: Pupils are equal, round, and reactive to light.  Neck: Neck supple. No JVD present. No tracheal deviation present. No thyromegaly present.  Cardiovascular: Normal rate, regular rhythm and intact distal pulses.   Respiratory: Effort normal and breath sounds normal. No respiratory distress. He has no wheezes.  GI: Soft. There is no tenderness. There is no guarding.  Musculoskeletal:       Right  hip: He exhibits decreased range of motion, decreased strength, tenderness and bony tenderness. He exhibits no swelling, no deformity and no laceration.  Lymphadenopathy:    He has no cervical adenopathy.  Neurological: He is alert and oriented to person, place, and time.  Skin: Skin is warm and dry.  Psychiatric: He has a normal mood and affect.    Vital signs in last 24 hours: Temp:  [98.9 F (37.2 C)] 98.9 F (37.2 C) (04/25 0845) Pulse Rate:  [98] 98 (04/25 0845) Resp:  [16] 16 (04/25 0845) BP: (140)/(84) 140/84 (04/25 0845) SpO2:  [97 %] 97 % (04/25 0845) Weight:  [74.7 kg (164 lb 11.2 oz)] 74.7 kg (164 lb 11.2 oz) (04/25 0845)  Labs:   Estimated body mass index is 23.63 kg/m as calculated from the following:   Height as of 05/09/16: 5\' 10"  (1.778 m).   Weight as of 05/09/16: 74.7 kg (164 lb 11.2 oz).   Imaging Review Plain radiographs demonstrate severe degenerative joint disease of the right hip(s). The bone quality appears to be good for age and reported activity level.  Assessment/Plan:  End stage arthritis, right hip(s)  The patient history, physical examination, clinical judgement of the provider and imaging studies are consistent with end stage degenerative joint disease of the right hip(s) and total hip arthroplasty is deemed medically necessary. The treatment options including medical management, injection therapy, arthroscopy and arthroplasty were discussed at length. The risks and benefits of total hip arthroplasty were presented and reviewed. The risks due to aseptic loosening, infection, stiffness, dislocation/subluxation,  thromboembolic complications and other imponderables were discussed.  The patient acknowledged the explanation, agreed to proceed with the plan and consent was signed. Patient is being admitted for inpatient treatment for surgery, pain control, PT, OT, prophylactic antibiotics, VTE prophylaxis, progressive ambulation and ADL's and discharge  planning.The patient is planning to be discharged home.     [name]   PA-C  05/09/2016, 12:53 PM

## 2016-05-09 NOTE — Progress Notes (Signed)
Final EKG done 05/09/16-epic

## 2016-05-09 NOTE — Progress Notes (Signed)
.......  1946-01-03  patient's date of birth is  Your patient has screened at an elevated risk for obstructive sleep apnea using the STOP-Bang tool during a presurgical visit. A score of 5 or greater is an elevated risk.

## 2016-05-10 ENCOUNTER — Other Ambulatory Visit: Payer: Self-pay | Admitting: Orthopedic Surgery

## 2016-05-15 ENCOUNTER — Inpatient Hospital Stay (HOSPITAL_COMMUNITY): Payer: Medicare HMO

## 2016-05-15 ENCOUNTER — Inpatient Hospital Stay (HOSPITAL_COMMUNITY): Payer: Medicare HMO | Admitting: Anesthesiology

## 2016-05-15 ENCOUNTER — Encounter (HOSPITAL_COMMUNITY)
Admission: RE | Disposition: A | Discharge status: Home / Self Care | Payer: Self-pay | Source: Ambulatory Visit | Attending: Orthopedic Surgery

## 2016-05-15 ENCOUNTER — Encounter (HOSPITAL_COMMUNITY): Payer: Self-pay | Admitting: *Deleted

## 2016-05-15 ENCOUNTER — Inpatient Hospital Stay (HOSPITAL_COMMUNITY)
Admission: RE | Admit: 2016-05-15 | Discharge: 2016-05-16 | DRG: 470 | Disposition: A | Discharge status: Home / Self Care | Payer: Medicare HMO | Source: Ambulatory Visit | Attending: Orthopedic Surgery | Admitting: Orthopedic Surgery

## 2016-05-15 DIAGNOSIS — Z79899 Other long term (current) drug therapy: Secondary | ICD-10-CM | POA: Diagnosis not present

## 2016-05-15 DIAGNOSIS — Z8739 Personal history of other diseases of the musculoskeletal system and connective tissue: Secondary | ICD-10-CM | POA: Diagnosis not present

## 2016-05-15 DIAGNOSIS — Z79891 Long term (current) use of opiate analgesic: Secondary | ICD-10-CM | POA: Diagnosis not present

## 2016-05-15 DIAGNOSIS — Z96649 Presence of unspecified artificial hip joint: Secondary | ICD-10-CM

## 2016-05-15 DIAGNOSIS — M1611 Unilateral primary osteoarthritis, right hip: Secondary | ICD-10-CM | POA: Diagnosis present

## 2016-05-15 DIAGNOSIS — I10 Essential (primary) hypertension: Secondary | ICD-10-CM | POA: Diagnosis present

## 2016-05-15 DIAGNOSIS — F17200 Nicotine dependence, unspecified, uncomplicated: Secondary | ICD-10-CM | POA: Diagnosis present

## 2016-05-15 DIAGNOSIS — Z96641 Presence of right artificial hip joint: Secondary | ICD-10-CM

## 2016-05-15 HISTORY — PX: TOTAL HIP ARTHROPLASTY: SHX124

## 2016-05-15 LAB — TYPE AND SCREEN
ABO/RH(D): A POS
ANTIBODY SCREEN: NEGATIVE

## 2016-05-15 SURGERY — ARTHROPLASTY, HIP, TOTAL, ANTERIOR APPROACH
Anesthesia: Spinal | Site: Hip | Laterality: Right

## 2016-05-15 MED ORDER — BUPIVACAINE IN DEXTROSE 0.75-8.25 % IT SOLN
INTRATHECAL | Status: DC | PRN
  Administered 2016-05-15: 2 mL via INTRATHECAL

## 2016-05-15 MED ORDER — MENTHOL 3 MG MT LOZG: 1.0000 | LOZENGE | OROMUCOSAL | Status: DC | PRN

## 2016-05-15 MED ORDER — CEFAZOLIN SODIUM-DEXTROSE 2-4 GM/100ML-% IV SOLN
2.0000 g | Freq: Four times a day (QID) | INTRAVENOUS | Status: AC
  Administered 2016-05-15 (×2): 2 g via INTRAVENOUS
  Filled 2016-05-15: qty 100

## 2016-05-15 MED ORDER — METOCLOPRAMIDE HCL 5 MG PO TABS: 5.0000 mg | ORAL_TABLET | Freq: Three times a day (TID) | ORAL | Status: DC | PRN

## 2016-05-15 MED ORDER — HYDROCODONE-ACETAMINOPHEN 7.5-325 MG PO TABS
1.0000 | ORAL_TABLET | ORAL | Status: DC
  Administered 2016-05-15 (×2): 2 via ORAL
  Administered 2016-05-15: 12:00:00 1 via ORAL
  Administered 2016-05-15 – 2016-05-16 (×4): 2 via ORAL
  Filled 2016-05-15 (×3): qty 2
  Filled 2016-05-15: qty 1
  Filled 2016-05-15 (×3): qty 2

## 2016-05-15 MED ORDER — PHENYLEPHRINE HCL 10 MG/ML IJ SOLN
INTRAMUSCULAR | Status: AC
  Filled 2016-05-15: qty 1

## 2016-05-15 MED ORDER — ONDANSETRON HCL 4 MG/2ML IJ SOLN
INTRAMUSCULAR | Status: DC | PRN
  Administered 2016-05-15: 4 mg via INTRAVENOUS

## 2016-05-15 MED ORDER — METHOCARBAMOL 500 MG PO TABS
500.0000 mg | ORAL_TABLET | Freq: Four times a day (QID) | ORAL | Status: DC | PRN
  Administered 2016-05-15 (×2): 500 mg via ORAL
  Filled 2016-05-15 (×2): qty 1

## 2016-05-15 MED ORDER — FENTANYL CITRATE (PF) 100 MCG/2ML IJ SOLN
INTRAMUSCULAR | Status: AC
Start: 2016-05-15 — End: 2016-05-15
  Filled 2016-05-15: qty 2

## 2016-05-15 MED ORDER — LACTATED RINGERS IV SOLN
INTRAVENOUS | Status: DC
  Administered 2016-05-15 (×3): via INTRAVENOUS

## 2016-05-15 MED ORDER — POLYETHYLENE GLYCOL 3350 17 G PO PACK
17.0000 g | PACK | Freq: Two times a day (BID) | ORAL | Status: DC
  Administered 2016-05-15: 17 g via ORAL
  Filled 2016-05-15: qty 1

## 2016-05-15 MED ORDER — SODIUM CHLORIDE 0.9 % IV SOLN
100.0000 mL/h | INTRAVENOUS | Status: DC
  Administered 2016-05-15 (×2): 100 mL/h via INTRAVENOUS
  Filled 2016-05-15 (×6): qty 1000

## 2016-05-15 MED ORDER — CHLORHEXIDINE GLUCONATE 4 % EX LIQD: 60.0000 mL | Freq: Once | CUTANEOUS | Status: DC

## 2016-05-15 MED ORDER — PHENYLEPHRINE HCL 10 MG/ML IJ SOLN
INTRAVENOUS | Status: DC | PRN
  Administered 2016-05-15: 25 ug/min via INTRAVENOUS

## 2016-05-15 MED ORDER — SODIUM CHLORIDE 0.9 % IR SOLN
Status: DC | PRN
  Administered 2016-05-15: 1000 mL

## 2016-05-15 MED ORDER — PROPOFOL 10 MG/ML IV BOLUS
INTRAVENOUS | Status: AC
  Filled 2016-05-15: qty 40

## 2016-05-15 MED ORDER — CEFAZOLIN SODIUM-DEXTROSE 2-4 GM/100ML-% IV SOLN
2.0000 g | INTRAVENOUS | Status: AC
  Administered 2016-05-15: 2 g via INTRAVENOUS

## 2016-05-15 MED ORDER — METHOCARBAMOL 500 MG PO TABS: 500.0000 mg | ORAL_TABLET | Freq: Four times a day (QID) | ORAL | 0 refills | Status: DC | PRN

## 2016-05-15 MED ORDER — MIDAZOLAM HCL 2 MG/2ML IJ SOLN
INTRAMUSCULAR | Status: AC
  Filled 2016-05-15: qty 2

## 2016-05-15 MED ORDER — STERILE WATER FOR IRRIGATION IR SOLN
Status: DC | PRN
  Administered 2016-05-15: 2000 mL

## 2016-05-15 MED ORDER — METOCLOPRAMIDE HCL 5 MG/ML IJ SOLN: 5.0000 mg | Freq: Three times a day (TID) | INTRAMUSCULAR | Status: DC | PRN

## 2016-05-15 MED ORDER — PROPOFOL 10 MG/ML IV BOLUS
INTRAVENOUS | Status: AC
  Filled 2016-05-15: qty 20

## 2016-05-15 MED ORDER — ONDANSETRON HCL 4 MG/2ML IJ SOLN: 4.0000 mg | Freq: Four times a day (QID) | INTRAMUSCULAR | Status: DC | PRN

## 2016-05-15 MED ORDER — FERROUS SULFATE 325 (65 FE) MG PO TABS
325.0000 mg | ORAL_TABLET | Freq: Three times a day (TID) | ORAL | Status: DC
  Administered 2016-05-15 – 2016-05-16 (×2): 325 mg via ORAL
  Filled 2016-05-15 (×3): qty 1

## 2016-05-15 MED ORDER — TRANEXAMIC ACID 1000 MG/10ML IV SOLN
1000.0000 mg | INTRAVENOUS | Status: AC
  Administered 2016-05-15: 1000 mg via INTRAVENOUS
  Filled 2016-05-15: qty 10

## 2016-05-15 MED ORDER — DEXAMETHASONE SODIUM PHOSPHATE 10 MG/ML IJ SOLN
INTRAMUSCULAR | Status: AC
  Filled 2016-05-15: qty 1

## 2016-05-15 MED ORDER — DEXAMETHASONE SODIUM PHOSPHATE 10 MG/ML IJ SOLN
10.0000 mg | Freq: Once | INTRAMUSCULAR | Status: AC
  Administered 2016-05-16: 10 mg via INTRAVENOUS
  Filled 2016-05-15: qty 1

## 2016-05-15 MED ORDER — HYDROMORPHONE HCL 1 MG/ML IJ SOLN: 0.2500 mg | INTRAMUSCULAR | Status: DC | PRN

## 2016-05-15 MED ORDER — POLYETHYLENE GLYCOL 3350 17 G PO PACK: 17.0000 g | PACK | Freq: Two times a day (BID) | ORAL | 0 refills | Status: DC

## 2016-05-15 MED ORDER — DIPHENHYDRAMINE HCL 25 MG PO CAPS: 25.0000 mg | ORAL_CAPSULE | Freq: Four times a day (QID) | ORAL | Status: DC | PRN

## 2016-05-15 MED ORDER — PROPOFOL 500 MG/50ML IV EMUL
INTRAVENOUS | Status: DC | PRN
  Administered 2016-05-15: 100 ug/kg/min via INTRAVENOUS

## 2016-05-15 MED ORDER — ASPIRIN 81 MG PO CHEW
81.0000 mg | CHEWABLE_TABLET | Freq: Two times a day (BID) | ORAL | Status: DC
  Administered 2016-05-15 – 2016-05-16 (×2): 81 mg via ORAL
  Filled 2016-05-15 (×2): qty 1

## 2016-05-15 MED ORDER — HYDROMORPHONE HCL 1 MG/ML IJ SOLN
0.5000 mg | INTRAMUSCULAR | Status: DC | PRN
  Administered 2016-05-16: 06:00:00 1 mg via INTRAVENOUS
  Filled 2016-05-15: qty 1

## 2016-05-15 MED ORDER — ALUM & MAG HYDROXIDE-SIMETH 200-200-20 MG/5ML PO SUSP: 15.0000 mL | ORAL | Status: DC | PRN

## 2016-05-15 MED ORDER — DOCUSATE SODIUM 100 MG PO CAPS
100.0000 mg | ORAL_CAPSULE | Freq: Two times a day (BID) | ORAL | Status: DC
  Administered 2016-05-15 – 2016-05-16 (×2): 100 mg via ORAL
  Filled 2016-05-15 (×2): qty 1

## 2016-05-15 MED ORDER — CEFAZOLIN SODIUM-DEXTROSE 2-4 GM/100ML-% IV SOLN
INTRAVENOUS | Status: AC
  Filled 2016-05-15: qty 100

## 2016-05-15 MED ORDER — ATORVASTATIN CALCIUM 40 MG PO TABS
40.0000 mg | ORAL_TABLET | Freq: Every evening | ORAL | Status: DC
  Administered 2016-05-15: 40 mg via ORAL
  Filled 2016-05-15: qty 1

## 2016-05-15 MED ORDER — PHENOL 1.4 % MT LIQD: 1.0000 | OROMUCOSAL | Status: DC | PRN

## 2016-05-15 MED ORDER — CELECOXIB 200 MG PO CAPS
200.0000 mg | ORAL_CAPSULE | Freq: Two times a day (BID) | ORAL | Status: DC
  Administered 2016-05-15 – 2016-05-16 (×3): 200 mg via ORAL
  Filled 2016-05-15 (×3): qty 1

## 2016-05-15 MED ORDER — DEXAMETHASONE SODIUM PHOSPHATE 10 MG/ML IJ SOLN
10.0000 mg | Freq: Once | INTRAMUSCULAR | Status: AC
  Administered 2016-05-15: 10 mg via INTRAVENOUS

## 2016-05-15 MED ORDER — ASPIRIN 81 MG PO CHEW: 81.0000 mg | CHEWABLE_TABLET | Freq: Two times a day (BID) | ORAL | 0 refills | Status: AC

## 2016-05-15 MED ORDER — ONDANSETRON HCL 4 MG/2ML IJ SOLN
INTRAMUSCULAR | Status: AC
  Filled 2016-05-15: qty 2

## 2016-05-15 MED ORDER — MAGNESIUM CITRATE PO SOLN: 1.0000 | Freq: Once | ORAL | Status: DC | PRN

## 2016-05-15 MED ORDER — TRIAMTERENE-HCTZ 37.5-25 MG PO TABS
1.0000 | ORAL_TABLET | Freq: Every day | ORAL | Status: DC
  Administered 2016-05-16: 11:00:00 1 via ORAL
  Filled 2016-05-15: qty 1

## 2016-05-15 MED ORDER — FENTANYL CITRATE (PF) 100 MCG/2ML IJ SOLN
INTRAMUSCULAR | Status: DC | PRN
  Administered 2016-05-15: 50 ug via INTRAVENOUS

## 2016-05-15 MED ORDER — BISACODYL 10 MG RE SUPP: 10.0000 mg | Freq: Every day | RECTAL | Status: DC | PRN

## 2016-05-15 MED ORDER — MIDAZOLAM HCL 5 MG/5ML IJ SOLN
INTRAMUSCULAR | Status: DC | PRN
  Administered 2016-05-15: 2 mg via INTRAVENOUS

## 2016-05-15 MED ORDER — DOCUSATE SODIUM 100 MG PO CAPS: 100.0000 mg | ORAL_CAPSULE | Freq: Two times a day (BID) | ORAL | 0 refills | Status: DC

## 2016-05-15 MED ORDER — HYDROCODONE-ACETAMINOPHEN 7.5-325 MG PO TABS: 1.0000 | ORAL_TABLET | ORAL | 0 refills | Status: DC | PRN

## 2016-05-15 MED ORDER — ONDANSETRON HCL 4 MG PO TABS: 4.0000 mg | ORAL_TABLET | Freq: Four times a day (QID) | ORAL | Status: DC | PRN

## 2016-05-15 MED ORDER — PROMETHAZINE HCL 25 MG/ML IJ SOLN: 6.2500 mg | INTRAMUSCULAR | Status: DC | PRN

## 2016-05-15 MED ORDER — FERROUS SULFATE 325 (65 FE) MG PO TABS: 325.0000 mg | ORAL_TABLET | Freq: Three times a day (TID) | ORAL | Status: DC

## 2016-05-15 MED ORDER — METHOCARBAMOL 1000 MG/10ML IJ SOLN
500.0000 mg | Freq: Four times a day (QID) | INTRAVENOUS | Status: DC | PRN
  Administered 2016-05-15: 500 mg via INTRAVENOUS
  Filled 2016-05-15: qty 550

## 2016-05-15 SURGICAL SUPPLY — 41 items
ADH SKN CLS APL DERMABOND .7 (GAUZE/BANDAGES/DRESSINGS) ×1
BAG SPEC THK2 15X12 ZIP CLS (MISCELLANEOUS)
BAG ZIPLOCK 12X15 (MISCELLANEOUS) IMPLANT
BLADE SAG 18X100X1.27 (BLADE) ×3 IMPLANT
CAPT HIP TOTAL 2 ×2 IMPLANT
CATH SILICONE 14FRX5CC (CATHETERS) ×2 IMPLANT
CLOTH BEACON ORANGE TIMEOUT ST (SAFETY) ×3 IMPLANT
COVER PERINEAL POST (MISCELLANEOUS) ×3 IMPLANT
COVER SURGICAL LIGHT HANDLE (MISCELLANEOUS) ×3 IMPLANT
DERMABOND ADVANCED (GAUZE/BANDAGES/DRESSINGS) ×2
DERMABOND ADVANCED .7 DNX12 (GAUZE/BANDAGES/DRESSINGS) ×1 IMPLANT
DRAPE STERI IOBAN 125X83 (DRAPES) ×3 IMPLANT
DRAPE U-SHAPE 47X51 STRL (DRAPES) ×6 IMPLANT
DRESSING AQUACEL AG SP 3.5X10 (GAUZE/BANDAGES/DRESSINGS) ×1 IMPLANT
DRSG AQUACEL AG SP 3.5X10 (GAUZE/BANDAGES/DRESSINGS) ×3
DURAPREP 26ML APPLICATOR (WOUND CARE) ×3 IMPLANT
ELECT REM PT RETURN 15FT ADLT (MISCELLANEOUS) ×3 IMPLANT
GLOVE BIOGEL PI IND STRL 7.5 (GLOVE) ×1 IMPLANT
GLOVE BIOGEL PI IND STRL 8 (GLOVE) IMPLANT
GLOVE BIOGEL PI IND STRL 8.5 (GLOVE) ×1 IMPLANT
GLOVE BIOGEL PI INDICATOR 7.5 (GLOVE) ×8
GLOVE BIOGEL PI INDICATOR 8 (GLOVE) ×2
GLOVE BIOGEL PI INDICATOR 8.5 (GLOVE) ×2
GLOVE ECLIPSE 8.0 STRL XLNG CF (GLOVE) ×6 IMPLANT
GLOVE ORTHO TXT STRL SZ7.5 (GLOVE) ×3 IMPLANT
GLOVE SURG SS PI 7.5 STRL IVOR (GLOVE) ×2 IMPLANT
GLOVE SURG SS PI 8.0 STRL IVOR (GLOVE) ×2 IMPLANT
GOWN SPEC L3 XXLG W/TWL (GOWN DISPOSABLE) ×2 IMPLANT
GOWN STRL REUS W/TWL 2XL LVL3 (GOWN DISPOSABLE) ×2 IMPLANT
GOWN STRL REUS W/TWL LRG LVL3 (GOWN DISPOSABLE) ×3 IMPLANT
GOWN STRL REUS W/TWL XL LVL3 (GOWN DISPOSABLE) ×3 IMPLANT
HOLDER FOLEY CATH W/STRAP (MISCELLANEOUS) ×3 IMPLANT
PACK ANTERIOR HIP CUSTOM (KITS) ×3 IMPLANT
SUT MNCRL AB 4-0 PS2 18 (SUTURE) ×3 IMPLANT
SUT STRATAFIX 0 PDS 27 VIOLET (SUTURE) ×3
SUT VIC AB 1 CT1 36 (SUTURE) ×9 IMPLANT
SUT VIC AB 2-0 CT1 27 (SUTURE) ×6
SUT VIC AB 2-0 CT1 TAPERPNT 27 (SUTURE) ×2 IMPLANT
SUTURE STRATFX 0 PDS 27 VIOLET (SUTURE) ×1 IMPLANT
TRAY FOLEY W/METER SILVER 16FR (SET/KITS/TRAYS/PACK) ×2 IMPLANT
YANKAUER SUCT BULB TIP 10FT TU (MISCELLANEOUS) ×2 IMPLANT

## 2016-05-15 NOTE — Interval H&P Note (Signed)
History and Physical Interval Note:  05/15/2016 6:56 AM  Marcus [name]  has presented today for surgery, with the diagnosis of right hip osteoarthritis  The various methods of treatment have been discussed with the patient and family. After consideration of risks, benefits and other options for treatment, the patient has consented to  Procedure(s): RIGHT TOTAL HIP ARTHROPLASTY ANTERIOR APPROACH (Right) as a surgical intervention .  The patient's history has been reviewed, patient examined, no change in status, stable for surgery.  I have reviewed the patient's chart and labs.  Questions were answered to the patient's satisfaction.     Mauri Pole

## 2016-05-15 NOTE — Discharge Instructions (Signed)

## 2016-05-15 NOTE — Anesthesia Procedure Notes (Signed)
Spinal  Patient location during procedure: OR Start time: 05/15/2016 7:15 AM End time: 05/15/2016 7:20 AM Staffing Anesthesiologist: ROSE, Iona Beard Resident/CRNA: Glory Buff Performed: resident/CRNA  Preanesthetic Checklist Completed: patient identified, site marked, surgical consent, pre-op evaluation, timeout performed, IV checked, risks and benefits discussed and monitors and equipment checked Spinal Block Patient position: sitting Prep: DuraPrep Patient monitoring: heart rate, continuous pulse ox and blood pressure Approach: midline Location: L2-3 Injection technique: single-shot Needle Needle type: Spinocan  Needle gauge: 22 G Needle length: 9 cm Needle insertion depth: 6 cm Assessment Sensory level: T6 Additional Notes Kit expiration date checked and confirmed.  -paraesthesia, -heme, +CSF, patient tolerated procedure well.

## 2016-05-15 NOTE — Anesthesia Preprocedure Evaluation (Addendum)
Anesthesia Evaluation  Patient identified by MRN, date of birth, ID band Patient awake    Reviewed: Allergy & Precautions, NPO status , Patient's Chart, lab work & pertinent test results  Airway Mallampati: II  TM Distance: >3 FB Neck ROM: Full    Dental no notable dental hx.    Pulmonary Current Smoker,    Pulmonary exam normal breath sounds clear to auscultation       Cardiovascular hypertension, Normal cardiovascular exam Rhythm:Regular Rate:Normal     Neuro/Psych negative neurological ROS  negative psych ROS   GI/Hepatic negative GI ROS, Neg liver ROS,   Endo/Other  negative endocrine ROS  Renal/GU negative Renal ROS  negative genitourinary   Musculoskeletal negative musculoskeletal ROS (+)   Abdominal   Peds negative pediatric ROS (+)  Hematology negative hematology ROS (+)   Anesthesia Other Findings   Reproductive/Obstetrics negative OB ROS                             Anesthesia Physical Anesthesia Plan  ASA: II  Anesthesia Plan: Spinal   Post-op Pain Management:    Induction: Intravenous  Airway Management Planned: Simple Face Mask  Additional Equipment:   Intra-op Plan:   Post-operative Plan:   Informed Consent: I have reviewed the patients History and Physical, chart, labs and discussed the procedure including the risks, benefits and alternatives for the proposed anesthesia with the patient or authorized representative who has indicated his/her understanding and acceptance.   Dental advisory given  Plan Discussed with: CRNA and Surgeon  Anesthesia Plan Comments:         Anesthesia Quick Evaluation

## 2016-05-15 NOTE — Evaluation (Signed)
Physical Therapy Evaluation Patient Details Name: Marcus [name] MRN: [id number] DOB: 18-Oct-1945 Today's Date: 05/15/2016   History of Present Illness  s/p R THA  Clinical Impression  Pt is s/p THA resulting in the deficits listed below (see PT Problem List). *Pt will benefit from skilled PT to increase their independence and safety with mobility to allow discharge to the venue listed below.  Pt unsteady with gait today, plantar surface of feet reported to be "numb" after standing; has limited caregiver support and is asking about [employer] aide; will follow     Follow Up Recommendations Home health PT    Equipment Recommendations  Rolling walker with 5" wheels;3in1 (PT)    Recommendations for Other Services       Precautions / Restrictions        Mobility  Bed Mobility Overal bed mobility: Needs Assistance Bed Mobility: Supine to Sit     Supine to sit: Min assist     General bed mobility comments: assist with RLE , incr time  Transfers Overall transfer level: Needs assistance Equipment used: Rolling walker (2 wheeled) Transfers: Sit to/from Stand Sit to Stand: Min assist         General transfer comment: cues for hand placement, RLE position  Ambulation/Gait Ambulation/Gait assistance: Min assist;+2 safety/equipment Ambulation Distance (Feet): 14 Feet (in room) Assistive device: Rolling walker (2 wheeled) Gait Pattern/deviations: Step-to pattern;Trunk flexed;Narrow base of support Gait velocity: after standing, pt c/o plantar surface of feet numb and tingling--gait limited d/t this and  pt felt he was getting "shaky" as well   General Gait Details: multi-modal cues for posture, incr BOS, RW position, sequence and step length;   Stairs            Wheelchair Mobility    Modified Rankin (Stroke Patients Only)       Balance Overall balance assessment: Needs assistance   Sitting balance-Leahy Scale: Fair     Standing balance support: Bilateral upper  extremity supported Standing balance-Leahy Scale: Poor Standing balance comment: reliant on UEs                             Pertinent Vitals/Pain Pain Assessment: 0-10 Pain Score: 2  Pain Location: right hip Pain Descriptors / Indicators: Aching Pain Intervention(s): Limited activity within patient's tolerance;Monitored during session    Home Living Family/patient expects to be discharged to:: Private residence Living Arrangements: Alone;Non-relatives/Friends (roommate)   Type of Home: House Home Access: Stairs to enter   Technical brewer of Steps: 1 and 2 Home Layout: One level Home Equipment: Other (comment);Cane - single point (3 wheeled walker ) Additional Comments: roommate unable to assist after surgery "he's sicker than I am"    Prior Function Level of Independence: Independent;Independent with assistive device(s)               Hand Dominance        Extremity/Trunk Assessment   Upper Extremity Assessment Upper Extremity Assessment: Overall WFL for tasks assessed    Lower Extremity Assessment Lower Extremity Assessment: RLE deficits/detail RLE Deficits / Details: ankle WFL; knee grossly 2-/5; limited by post op pain       Communication   Communication: No difficulties  Cognition Arousal/Alertness: Awake/alert Behavior During Therapy: WFL for tasks assessed/performed Overall Cognitive Status: Within Functional Limits for tasks assessed  General Comments      Exercises Total Joint Exercises Ankle Circles/Pumps: AROM;Both;Limitations Ankle Circles/Pumps Limitations: c/o pain on R, limited ROM   Assessment/Plan    PT Assessment Patient needs continued PT services  PT Problem List Decreased strength;Decreased range of motion;Decreased activity tolerance;Decreased mobility       PT Treatment Interventions DME instruction;Gait training;Functional mobility training;Therapeutic  activities;Therapeutic exercise;Patient/family education    PT Goals (Current goals can be found in the Care Plan section)  Acute Rehab PT Goals Patient Stated Goal: to get better PT Goal Formulation: With patient Time For Goal Achievement: 05/18/16 Potential to Achieve Goals: Good    Frequency 7X/week   Barriers to discharge        Co-evaluation               AM-PAC PT "6 Clicks" Daily Activity  Outcome Measure Difficulty turning over in bed (including adjusting bedclothes, sheets and blankets)?: A Little Difficulty moving from lying on back to sitting on the side of the bed? : A Little Difficulty sitting down on and standing up from a chair with arms (e.g., wheelchair, bedside commode, etc,.)?: A Little Help needed moving to and from a bed to chair (including a wheelchair)?: A Little Help needed walking in hospital room?: A Little Help needed climbing 3-5 steps with a railing? : A Little 6 Click Score: 18    End of Session Equipment Utilized During Treatment: Gait belt Activity Tolerance: Patient tolerated treatment well Patient left: in chair;with call bell/phone within reach;with chair alarm set   PT Visit Diagnosis: Difficulty in walking, not elsewhere classified (R26.2);Pain Pain - Right/Left: Right Pain - part of body: Hip    Time: 2426-8341 PT Time Calculation (min) (ACUTE ONLY): 29 min   Charges:   PT Evaluation $PT Eval Low Complexity: 1 Procedure PT Treatments $Gait Training: 8-22 mins   PT G Codes:          Darian Cansler 2016-06-13, 3:26 PM

## 2016-05-15 NOTE — Op Note (Signed)
NAME:  Marcus [name] NO.: [id number]      MEDICAL RECORD NO.: [id number]      FACILITY:  [facility]      PHYSICIAN:  Paralee Cancel D  DATE OF BIRTH:  [birth year]     DATE OF PROCEDURE:  05/15/2016                                 OPERATIVE REPORT         PREOPERATIVE DIAGNOSIS: Right  hip osteoarthritis.      POSTOPERATIVE DIAGNOSIS:  Right hip osteoarthritis.      PROCEDURE:  Right total hip replacement through an anterior approach   utilizing DePuy THR system, component size 36mm pinnacle cup, a size 36+4 neutral   Altrex liner, a size 6 Hi Tri Lock stem with a 36+1.5 delta ceramic   ball.      SURGEON:  Pietro Cassis. Alvan Dame, M.D.      ASSISTANT:  Danae Orleans, PA-C     ANESTHESIA:  Spinal.      SPECIMENS:  None.      COMPLICATIONS:  None.      BLOOD LOSS:  450 cc     DRAINS:  None      INDICATION OF THE PROCEDURE:  Marcus [name] is a 71 y.o. male who had   presented to office for evaluation of right hip pain.  Radiographs revealed   progressive degenerative changes with bone-on-bone   articulation to the  hip joint.  The patient had painful limited range of   motion significantly affecting their overall quality of life.  The patient was failing to    respond to conservative measures, and at this point was ready   to proceed with more definitive measures.  The patient has noted progressive   degenerative changes in his hip, progressive problems and dysfunction   with regarding the hip prior to surgery.  Consent was obtained for   benefit of pain relief.  Specific risk of infection, DVT, component   failure, dislocation, need for revision surgery, as well discussion of   the anterior versus posterior approach were reviewed.  Consent was   obtained for benefit of anterior pain relief through an anterior   approach.      PROCEDURE IN DETAIL:  The patient was brought to operative theater.   Once adequate anesthesia,  preoperative antibiotics, 2gm of Ancef, 1 gm of Tranexamic Aid, and 10 mg of Decadron administered.   The patient was positioned supine on the OSI Hanna table.  Once adequate   padding of boney process was carried out, we had predraped out the hip, and  used fluoroscopy to confirm orientation of the pelvis and position.      The right hip was then prepped and draped from proximal iliac crest to   mid thigh with shower curtain technique.      Time-out was performed identifying the patient, planned procedure, and   extremity.     An incision was then made 2 cm distal and lateral to the   anterior superior iliac spine extending over the orientation of the   tensor fascia lata muscle and sharp dissection was carried down to the   fascia of the muscle and protractor placed in the soft tissues.      The fascia was  then incised.  The muscle belly was identified and swept   laterally and retractor placed along the superior neck.  Following   cauterization of the circumflex vessels and removing some pericapsular   fat, a second cobra retractor was placed on the inferior neck.  A third   retractor was placed on the anterior acetabulum after elevating the   anterior rectus.  A L-capsulotomy was along the line of the   superior neck to the trochanteric fossa, then extended proximally and   distally.  Tag sutures were placed and the retractors were then placed   intracapsular.  We then identified the trochanteric fossa and   orientation of my neck cut, confirmed this radiographically   and then made a neck osteotomy with the femur on traction.  The femoral   head was removed without difficulty or complication.  Traction was let   off and retractors were placed posterior and anterior around the   acetabulum.      The labrum and foveal tissue were debrided.  I began reaming with a 31mm   reamer and reamed up to 76mm reamer with good bony bed preparation and a 44mm   cup was chosen.  The final 92mm  Pinnacle cup was then impacted under fluoroscopy  to confirm the depth of penetration and orientation with respect to   abduction.  A screw was placed followed by the hole eliminator.  The final   36+4 neutral Altrex liner was impacted with good visualized rim fit.  The cup was positioned anatomically within the acetabular portion of the pelvis.      At this point, the femur was rolled at 80 degrees.  Further capsule was   released off the inferior aspect of the femoral neck.  I then   released the superior capsule proximally.  The hook was placed laterally   along the femur and elevated manually and held in position with the bed   hook.  The leg was then extended and adducted with the leg rolled to 100   degrees of external rotation.  Once the proximal femur was fully   exposed, I used a box osteotome to set orientation.  I then began   broaching with the starting chili pepper broach and passed this by hand and then broached up to 6.  With the 6 broach in place I chose a high offset neck and did several trial reductions.  The offset was appropriate, leg lengths   appeared to be appropriate given that he has left hip OA and will need to have it replaced at some pint, confirmed radiographically.   Given these findings, I went ahead and dislocated the hip, repositioned all   retractors and positioned the right hip in the extended and abducted position.  The final 6 Hi Tri Lock stem was   chosen and it was impacted down to the level of neck cut.  Based on this   and the trial reduction, a 36+1.5 delta ceramic ball was chosen and   impacted onto a clean and dry trunnion, and the hip was reduced.  The   hip had been irrigated throughout the case again at this point.  I did   reapproximate the superior capsular leaflet to the anterior leaflet   using #1 Vicryl.  The fascia of the   tensor fascia lata muscle was then reapproximated using #1 Vicryl and #0 Stratafix.  The   remaining wound was closed  with 2-0 Vicryl and running 4-0  Monocryl.   The hip was cleaned, dried, and dressed sterilely using Dermabond and   Aquacel dressing.  He was then brought   to recovery room in stable condition tolerating the procedure well.    Danae Orleans, PA-C was present for the entirety of the case involved from   preoperative positioning, perioperative retractor management, general   facilitation of the case, as well as primary wound closure as assistant.            Pietro Cassis Alvan Dame, M.D.        05/15/2016 8:40 AM

## 2016-05-15 NOTE — Transfer of Care (Signed)
Immediate Anesthesia Transfer of Care Note  Patient: TATSUO MUSIAL  Procedure(s) Performed: Procedure(s): RIGHT TOTAL HIP ARTHROPLASTY ANTERIOR APPROACH (Right)  Patient Location: PACU  Anesthesia Type:Spinal  Level of Consciousness: awake, alert  and oriented  Airway & Oxygen Therapy: Patient Spontanous Breathing and Patient connected to face mask oxygen  Post-op Assessment: Report given to RN and Post -op Vital signs reviewed and stable  Post vital signs: Reviewed and stable  Last Vitals:  Vitals:   05/15/16 0522  BP: (!) 163/77  Pulse: 73  Resp: 18  Temp: 36.8 C    Last Pain:  Vitals:   05/15/16 0528  TempSrc:   PainSc: 3       Patients Stated Pain Goal: 3 (37/48/27 0786)  Complications: No apparent anesthesia complications

## 2016-05-15 NOTE — Anesthesia Postprocedure Evaluation (Signed)
Anesthesia Post Note  Patient: Marcus [name]  Procedure(s) Performed: Procedure(s) (LRB): RIGHT TOTAL HIP ARTHROPLASTY ANTERIOR APPROACH (Right)  Patient location during evaluation: PACU Anesthesia Type: Spinal Level of consciousness: oriented and awake and alert Pain management: pain level controlled Vital Signs Assessment: post-procedure vital signs reviewed and stable Respiratory status: spontaneous breathing, respiratory function stable and patient connected to nasal cannula oxygen Cardiovascular status: blood pressure returned to baseline and stable Postop Assessment: no headache and no backache Anesthetic complications: no       Last Vitals:  Vitals:   05/15/16 1000 05/15/16 1015  BP: 129/65 124/82  Pulse: (!) 59 (!) 56  Resp: 15 15  Temp:      Last Pain:  Vitals:   05/15/16 0528  TempSrc:   PainSc: 3                  Shealynn Saulnier S

## 2016-05-16 LAB — BASIC METABOLIC PANEL
Anion gap: 7 (ref 5–15)
BUN: 15 mg/dL (ref 6–20)
CALCIUM: 8.3 mg/dL — AB (ref 8.9–10.3)
CO2: 24 mmol/L (ref 22–32)
Chloride: 105 mmol/L (ref 101–111)
Creatinine, Ser: 1.06 mg/dL (ref 0.61–1.24)
GFR calc Af Amer: >60 mL/min (ref >60)
GLUCOSE: 141 mg/dL — AB (ref 65–99)
POTASSIUM: 4.4 mmol/L (ref 3.5–5.1)
Sodium: 136 mmol/L (ref 135–145)

## 2016-05-16 LAB — CBC
HEMATOCRIT: 25.1 % — AB (ref 39.0–52.0)
Hemoglobin: 8.6 g/dL — ABNORMAL LOW (ref 13.0–17.0)
MCH: 29.4 pg (ref 26.0–34.0)
MCHC: 34.3 g/dL (ref 30.0–36.0)
MCV: 85.7 fL (ref 78.0–100.0)
PLATELETS: 111 10*3/uL — AB (ref 150–400)
RBC: 2.93 MIL/uL — ABNORMAL LOW (ref 4.22–5.81)
RDW: 14.6 % (ref 11.5–15.5)
WBC: 7.5 10*3/uL (ref 4.0–10.5)

## 2016-05-16 NOTE — Progress Notes (Signed)
Physical Therapy Treatment Patient Details Name: Marcus [name] MRN: [id number] DOB: December 09, 1945 Today's Date: 05/16/2016    History of Present Illness s/p R THA    PT Comments    Progressing with mobility. Reviewed gait training and stair training. All education completed. Ready to d/c from PT standpoint-made RN aware.    Follow Up Recommendations  Home health PT; Intermittent Supervision/assist     Equipment Recommendations  Rolling walker with 5" wheels    Recommendations for Other Services       Precautions / Restrictions Precautions Precautions: Fall Restrictions Weight Bearing Restrictions: No RLE Weight Bearing: Weight bearing as tolerated    Mobility  Bed Mobility Overal bed mobility: Needs Assistance      Supine to sit: Min guard     General bed mobility comments: oob in recliner  Transfers Overall transfer level: Needs assistance Equipment used: Rolling walker (2 wheeled) Transfers: Sit to/from Stand Sit to Stand: Min guard         General transfer comment: close guard for safety.   Ambulation/Gait Ambulation/Gait assistance: Min guard Ambulation Distance (Feet): 175 Feet Assistive device: Rolling walker (2 wheeled) Gait Pattern/deviations: Step-to pattern;Step-through pattern;Decreased stride length;Trunk flexed     General Gait Details: close guard for safety. VCs sequence, posture. Slow gait speed.    Stairs Stairs: Yes   Stair Management: Step to pattern;Forwards Number of Stairs: 2 General stair comments: 1 HHA given to ascend 2 steps without railings. VCs safety, technique, sequence. Daughter present to observe.  Verbally reviewed technique for thresehold step through door with walker.   Wheelchair Mobility    Modified Rankin (Stroke Patients Only)       Balance Overall balance assessment: Needs assistance Sitting-balance support: Bilateral upper extremity supported Sitting balance-Leahy Scale: Fair     Standing balance  support: Bilateral upper extremity supported Standing balance-Leahy Scale: Poor Standing balance comment: reliant on UEs                            Cognition Arousal/Alertness: Awake/alert Behavior During Therapy: WFL for tasks assessed/performed Overall Cognitive Status: Within Functional Limits for tasks assessed                                        Exercises Total Joint Exercises Quad Sets: AROM;Both;10 reps;Supine Heel Slides: AAROM;Right;10 reps;Supine Hip ABduction/ADduction: AAROM;Right;10 reps;Supine Long Arc Quad: AROM;Right;10 reps;Seated    General Comments        Pertinent Vitals/Pain Pain Assessment: 0-10 Pain Score: 6  Faces Pain Scale: Hurts little more Pain Location: R hip with activity Pain Descriptors / Indicators: Aching;Sore;Heaviness Pain Intervention(s): Monitored during session;Repositioned    Home Living Family/patient expects to be discharged to:: Private residence Living Arrangements: Alone;Non-relatives/Friends (roommate)   Type of Home: House Home Access: Stairs to enter   Home Layout: One level Home Equipment: Other (comment);Cane - single point;Shower seat (3 wheeled walker ) Additional Comments: Pt reports roommate is not able to offer much physical assist upon return home    Prior Function Level of Independence: Independent;Independent with assistive device(s)      Comments: Pt reports being able to complete ADLs, though was having difficulty completing LB dressing   PT Goals (current goals can now be found in the care plan section) Acute Rehab PT Goals Patient Stated Goal: to get better Progress towards PT goals: Progressing toward  goals    Frequency    7X/week      PT Plan Current plan remains appropriate    Co-evaluation              AM-PAC PT "6 Clicks" Daily Activity  Outcome Measure  Difficulty turning over in bed (including adjusting bedclothes, sheets and blankets)?: A  Little Difficulty moving from lying on back to sitting on the side of the bed? : A Little Difficulty sitting down on and standing up from a chair with arms (e.g., wheelchair, bedside commode, etc,.)?: A Little Help needed moving to and from a bed to chair (including a wheelchair)?: A Little Help needed walking in hospital room?: A Little Help needed climbing 3-5 steps with a railing? : A Little 6 Click Score: 18    End of Session Equipment Utilized During Treatment: Gait belt Activity Tolerance: Patient tolerated treatment well Patient left: with family/visitor present (in bathroom getting dressed. Made NT aware and asked her to supervise him. )   PT Visit Diagnosis: Muscle weakness (generalized) (M62.81);Difficulty in walking, not elsewhere classified (R26.2)     Time: 1337-1350 PT Time Calculation (min) (ACUTE ONLY): 13 min  Charges:  $Gait Training: 8-22 mins                    G Codes:          Weston Anna, MPT Pager: [phone]

## 2016-05-16 NOTE — Progress Notes (Signed)
    Durable Medical Equipment        Start     Ordered   05/16/16 0941  For home use only DME 3 n 1  Once     05/16/16 0940    [phone]

## 2016-05-16 NOTE — Progress Notes (Signed)
Physical Therapy Treatment Patient Details Name: AMALIO LOE MRN: [id number] DOB: Jun 06, 1945 Today's Date: 05/16/2016    History of Present Illness s/p R THA    PT Comments    Progressing with mobility. Will plan to have a 2nd session prior to d/c to practice stair negotiation.    Follow Up Recommendations  Home health PT     Equipment Recommendations  Rolling walker with 5" wheels    Recommendations for Other Services       Precautions / Restrictions Precautions Precautions: Fall Restrictions Weight Bearing Restrictions: No RLE Weight Bearing: Weight bearing as tolerated    Mobility  Bed Mobility               General bed mobility comments: oob in recliner  Transfers Overall transfer level: Needs assistance Equipment used: Rolling walker (2 wheeled) Transfers: Sit to/from Stand Sit to Stand: Min assist         General transfer comment: increased time. 2 attempts required. cues for R LE positioning, safety, hand placement.   Ambulation/Gait Ambulation/Gait assistance: Min guard Ambulation Distance (Feet): 150 Feet Assistive device: Rolling walker (2 wheeled) Gait Pattern/deviations: Step-to pattern;Step-through pattern;Decreased stride length;Trunk flexed     General Gait Details: close guard for safety. VCs sequence. slow gait speed.    Stairs            Wheelchair Mobility    Modified Rankin (Stroke Patients Only)       Balance                                            Cognition Arousal/Alertness: Awake/alert Behavior During Therapy: WFL for tasks assessed/performed Overall Cognitive Status: Within Functional Limits for tasks assessed                                        Exercises Total Joint Exercises Quad Sets: AROM;Both;10 reps;Supine Heel Slides: AAROM;Right;10 reps;Supine Hip ABduction/ADduction: AAROM;Right;10 reps;Supine Long Arc Quad: AROM;Right;10 reps;Seated    General  Comments        Pertinent Vitals/Pain Pain Assessment: 0-10 Pain Score: 5  Pain Location: R hip with activity Pain Descriptors / Indicators: Sore;Aching Pain Intervention(s): Monitored during session;Repositioned    Home Living                      Prior Function            PT Goals (current goals can now be found in the care plan section) Progress towards PT goals: Progressing toward goals    Frequency    7X/week      PT Plan Current plan remains appropriate    Co-evaluation              AM-PAC PT "6 Clicks" Daily Activity  Outcome Measure  Difficulty turning over in bed (including adjusting bedclothes, sheets and blankets)?: A Little Difficulty moving from lying on back to sitting on the side of the bed? : A Little Difficulty sitting down on and standing up from a chair with arms (e.g., wheelchair, bedside commode, etc,.)?: A Little Help needed moving to and from a bed to chair (including a wheelchair)?: A Little Help needed walking in hospital room?: A Little Help needed climbing 3-5 steps with a railing? : A Little  6 Click Score: 18    End of Session Equipment Utilized During Treatment: Gait belt Activity Tolerance: Patient tolerated treatment well Patient left: in chair;with call bell/phone within reach;with chair alarm set   PT Visit Diagnosis: Muscle weakness (generalized) (M62.81);Difficulty in walking, not elsewhere classified (R26.2)     Time: 1694-5038 PT Time Calculation (min) (ACUTE ONLY): 16 min  Charges:  $Gait Training: 8-22 mins                    G Codes:          Weston Anna, MPT Pager: [phone]

## 2016-05-16 NOTE — Evaluation (Signed)
Occupational Therapy Evaluation Patient Details Name: Marcus [name] MRN: [id number] DOB: March 17, 1945 Today's Date: 05/16/2016    History of Present Illness s/p R THA   Clinical Impression   Pt is a 71 y/o M who presents with the above. Pt presents below baseline with ADLs and functional mobility with greatest deficits in LB dressing and LB bathing. Pt lives with a roommate, however reports roommate is not in the best health and will only be able to assist intermittently upon return home. Pt will benefit from continued acute OT services to increase safety and independence with ADLs and functional mobility. Goals are for supervision to MinGuard Assist.     Follow Up Recommendations  No OT follow up;Supervision/Assistance - 24 hour (initially )    Equipment Recommendations  3 in 1 bedside commode           Precautions / Restrictions Precautions Precautions: Fall Restrictions Weight Bearing Restrictions: No RLE Weight Bearing: Weight bearing as tolerated      Mobility Bed Mobility Overal bed mobility: Needs Assistance Bed Mobility: Supine to Sit     Supine to sit: Min guard     General bed mobility comments: HOB flat during transfer, MinGuard for safety  Transfers Overall transfer level: Needs assistance Equipment used: Rolling walker (2 wheeled) Transfers: Sit to/from Stand Sit to Stand: Min assist;Min guard         General transfer comment: verbal cues for hand/LE placement     Balance Overall balance assessment: Needs assistance Sitting-balance support: Bilateral upper extremity supported Sitting balance-Leahy Scale: Fair     Standing balance support: Bilateral upper extremity supported Standing balance-Leahy Scale: Poor Standing balance comment: reliant on UEs                           ADL either performed or assessed with clinical judgement   ADL Overall ADL's : Needs assistance/impaired Eating/Feeding: Independent;Sitting   Grooming:  Wash/dry hands;Min guard;Standing   Upper Body Bathing: Min guard;Sitting   Lower Body Bathing: Minimal assistance;Sit to/from stand   Upper Body Dressing : Min guard;Sitting   Lower Body Dressing: Minimal assistance;Sit to/from stand Lower Body Dressing Details (indicate cue type and reason): Pt practiced simulating LB dressing this session  Toilet Transfer: Min guard;Ambulation;BSC;RW;Cueing for safety Toilet Transfer Details (indicate cue type and reason): BSC over toilet  Toileting- Clothing Manipulation and Hygiene: Min guard;Sit to/from Nurse, children's Details (indicate cue type and reason): Pt with tub unit at home; pt plans to sponge bath initially until he is able to safely step into tub (shower seat in tub)  Functional mobility during ADLs: Min guard;Rolling walker General ADL Comments: Pt completed bed mobility, room level functional mobility and functional mobility transfers, education provided throughout session on AE and compensatory techniques for completing ADLs to increase safety and independence                          Pertinent Vitals/Pain Pain Assessment: Faces Pain Score: 5  Faces Pain Scale: Hurts little more Pain Location: R hip with activity Pain Descriptors / Indicators: Sore;Aching Pain Intervention(s): Limited activity within patient's tolerance;Monitored during session;Premedicated before session          Extremity/Trunk Assessment Upper Extremity Assessment Upper Extremity Assessment: Overall WFL for tasks assessed           Communication Communication Communication: No difficulties   Cognition Arousal/Alertness: Awake/alert Behavior During  Therapy: WFL for tasks assessed/performed Overall Cognitive Status: Within Functional Limits for tasks assessed                                     General Comments                Home Living Family/patient expects to be discharged to:: Private  residence Living Arrangements: Alone;Non-relatives/Friends (roommate)   Type of Home: House Home Access: Stairs to enter     [place]: One level     Bathroom Shower/Tub: Tub/shower unit         Home Equipment: Other (comment);Cane - single point;Shower seat (3 wheeled walker )   Additional Comments: Pt reports roommate is not able to offer much physical assist upon return home      Prior Functioning/Environment Level of Independence: Independent;Independent with assistive device(s)        Comments: Pt reports being able to complete ADLs, though was having difficulty completing LB dressing        OT Problem List: Decreased strength;Decreased activity tolerance;Pain      OT Treatment/Interventions: Self-care/ADL training;Therapeutic exercise;DME and/or AE instruction;Therapeutic activities;Patient/family education    OT Goals(Current goals can be found in the care plan section) Acute Rehab OT Goals Patient Stated Goal: to get better OT Goal Formulation: With patient Time For Goal Achievement: 05/23/16 Potential to Achieve Goals: Good ADL Goals Pt Will Perform Grooming: with supervision;standing Pt Will Perform Lower Body Dressing: with min guard assist;with adaptive equipment Pt Will Transfer to Toilet: with supervision;ambulating;bedside commode (BSC over toilet ) Pt Will Perform Toileting - Clothing Manipulation and hygiene: with supervision;sit to/from stand  OT Frequency: Min 2X/week   Barriers to D/C: Decreased caregiver support                        AM-PAC PT "6 Clicks" Daily Activity     Outcome Measure Help from another person eating meals?: None Help from another person taking care of personal grooming?: A Little Help from another person toileting, which includes using toliet, bedpan, or urinal?: A Little Help from another person bathing (including washing, rinsing, drying)?: A Lot Help from another person to put on and taking off regular  upper body clothing?: A Little Help from another person to put on and taking off regular lower body clothing?: A Lot 6 Click Score: 17   End of Session Equipment Utilized During Treatment: Gait belt;Rolling walker Nurse Communication: Other (comment) (DME needs )  Activity Tolerance: Patient tolerated treatment well Patient left: in chair;with call bell/phone within reach;with chair alarm set  OT Visit Diagnosis: Muscle weakness (generalized) (M62.81);Pain Pain - Right/Left: Right Pain - part of body: Hip                Time: 2536-6440 OT Time Calculation (min): 34 min Charges:  OT General Charges $OT Visit: 1 Procedure OT Evaluation $OT Eval Low Complexity: 1 Procedure OT Treatments $Self Care/Home Management : 8-22 mins G-Codes:     Marcus [name], OT Pager [phone] 05/16/2016   Marcus [name] 05/16/2016, 12:09 PM

## 2016-05-16 NOTE — Progress Notes (Signed)
     Subjective: 1 Day Post-Op Procedure(s) (LRB): RIGHT TOTAL HIP ARTHROPLASTY ANTERIOR APPROACH (Right)   Patient reports pain as mild, pain controlled. No events throughout the night.  Looking forward to working with PT.  Ready to be discharged home.   Objective:   VITALS:   Vitals:   05/16/16 0126 05/16/16 0523  BP: (!) 143/68 (!) 152/71  Pulse: 67 66  Resp: 14 16  Temp: 97.7 F (36.5 C) 97.8 F (36.6 C)    Dorsiflexion/Plantar flexion intact Incision: dressing C/D/I No cellulitis present Compartment soft  LABS  Recent Labs  05/16/16 0428  HGB 8.6*  HCT 25.1*  WBC 7.5  PLT 111*     Recent Labs  05/16/16 0428  NA 136  K 4.4  BUN 15  CREATININE 1.06  GLUCOSE 141*     Assessment/Plan: 1 Day Post-Op Procedure(s) (LRB): RIGHT TOTAL HIP ARTHROPLASTY ANTERIOR APPROACH (Right) Foley cath d/c'ed Advance diet Up with therapy D/C IV fluids Discharge home Follow up in 2 weeks at [facility]. Follow up with OLIN,Audry Pecina D in 2 weeks.  Contact information:  [facility] [address] [place] Janye Maynor   PAC  05/16/2016, 7:48 AM

## 2016-05-23 NOTE — Discharge Summary (Signed)
Physician Discharge Summary  Patient ID: Marcus [name] MRN: [id number] DOB/AGE: 69-Nov-1947 68 y.o.  Admit date: 05/15/2016 Discharge date: 05/16/2016   Procedures:  Procedure(s) (LRB): RIGHT TOTAL HIP ARTHROPLASTY ANTERIOR APPROACH (Right)  Attending Physician:  Dr. Paralee Cancel   Admission Diagnoses:   Right hip primary OA / pain  Discharge Diagnoses:  Principal Problem:   S/P right THA, AA  Past Medical History:  Diagnosis Date  . Arthritis   . Depression   . History of transfusion    AS CHILD  . Hypertension   . Numbness and tingling of right arm   . 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 Prostate cancer (Marcus [name]) 09/04/13   Adenocarcinoma    HPI:    Marcus [name], 71 y.o. male, has a history of pain and functional disability in the right hip(s) due to arthritis and patient has failed non-surgical conservative treatments for greater than 12 weeks to include NSAID's and/or analgesics, use of assistive devices and activity modification.  Onset of symptoms was gradual starting 2+ years ago with gradually worsening course since that time.The patient noted no past surgery on the right hip(s).  Patient currently rates pain in the right hip at 10 out of 10 with activity. Patient has night pain, worsening of pain with activity and weight bearing, trendelenberg gait, pain that interfers with activities of daily living and pain with passive range of motion. Patient has evidence of periarticular osteophytes and joint space narrowing by imaging studies. This condition presents safety issues increasing the risk of falls. There is no current active infection.   Risks, benefits and expectations were discussed with the patient.  Risks including but not limited to the risk of anesthesia, blood clots, nerve damage, blood vessel damage, failure of the prosthesis, infection and up to and including death.  Patient understand the risks, benefits and expectations and wishes to proceed  with surgery.   PCP: Rogers Blocker, MD   Discharged Condition: good  Hospital Course:  Patient underwent the above stated procedure on 05/15/2016. Patient tolerated the procedure well and brought to the recovery room in good condition and subsequently to the floor.  POD #1 BP: 152/71 ; Pulse: 66 ; Temp: 97.8 F (36.6 C) ; Resp: 16 Patient reports pain as mild, pain controlled. No events throughout the night.  Looking forward to working with PT.  Ready to be discharged home.  Dorsiflexion/plantar flexion intact, incision: dressing C/D/I, no cellulitis present and compartment soft.   LABS  Basename    HGB     8.6  HCT     25.1     Discharge Exam: General appearance: alert, cooperative and no distress Extremities: Homans sign is negative, no sign of DVT, no edema, redness or tenderness in the calves or thighs and no ulcers, gangrene or trophic changes  Disposition: Home with follow up in 2 weeks   Follow-up Information    Paralee Cancel, MD. Schedule an appointment as soon as possible for a visit in 2 week(s).   Specialty:  Orthopedic Surgery Contact information: [address] [place] [address] [phone]           Discharge Instructions    Call MD / Call 911    Complete by:  As directed    If you experience chest pain or shortness of breath, CALL 911 and be transported to the hospital emergency room.  If you develope a fever above 101 F, pus (  white drainage) or increased drainage or redness at the wound, or calf pain, call your surgeon's office.   Change dressing    Complete by:  As directed    Maintain surgical dressing until follow up in the clinic. If the edges start to pull up, may reinforce with tape. If the dressing is no longer working, may remove and cover with gauze and tape, but must keep the area dry and clean.  Call with any questions or concerns.   Constipation Prevention    Complete by:  As directed    Drink plenty of fluids.  Prune  juice may be helpful.  You may use a stool softener, such as Colace (over the counter) 100 mg twice a day.  Use MiraLax (over the counter) for constipation as needed.   Diet - low sodium heart healthy    Complete by:  As directed    Discharge instructions    Complete by:  As directed    Maintain surgical dressing until follow up in the clinic. If the edges start to pull up, may reinforce with tape. If the dressing is no longer working, may remove and cover with gauze and tape, but must keep the area dry and clean.  Follow up in 2 weeks at [facility]. Call with any questions or concerns.   Increase activity slowly as tolerated    Complete by:  As directed    Weight bearing as tolerated with assist device (walker, cane, etc) as directed, use it as long as suggested by your surgeon or therapist, typically at least 4-6 weeks.   TED hose    Complete by:  As directed    Use stockings (TED hose) for 2 weeks on both leg(s).  You may remove them at night for sleeping.      Allergies as of 05/16/2016   No Known Allergies     Medication List    STOP taking these medications   traMADol 50 MG tablet Commonly known as:  ULTRAM     TAKE these medications   aspirin 81 MG chewable tablet Chew 1 tablet (81 mg total) by mouth 2 (two) times daily. Take for 4 weeks.   atorvastatin 40 MG tablet Commonly known as:  LIPITOR Take 40 mg by mouth every evening.   docusate sodium 100 MG capsule Commonly known as:  COLACE Take 1 capsule (100 mg total) by mouth 2 (two) times daily.   ferrous sulfate 325 (65 FE) MG tablet Commonly known as:  FERROUSUL Take 1 tablet (325 mg total) by mouth 3 (three) times daily with meals.   HYDROcodone-acetaminophen 7.5-325 MG tablet Commonly known as:  NORCO Take 1-2 tablets by mouth every 4 (four) hours as needed for moderate pain or severe pain.   Leuprolide Acetate (6 Month) 45 MG injection Commonly known as:  LUPRON Inject 45 mg into the muscle every  6 (six) months.   methocarbamol 500 MG tablet Commonly known as:  ROBAXIN Take 1 tablet (500 mg total) by mouth every 6 (six) hours as needed for muscle spasms.   polyethylene glycol packet Commonly known as:  MIRALAX / GLYCOLAX Take 17 g by mouth 2 (two) times daily.   triamterene-hydrochlorothiazide 37.5-25 MG tablet Commonly known as:  MAXZIDE-25 Take 1 tablet by mouth daily.        Signed: West Pugh. Sydnei Ohaver   PA-C  05/23/2016, 8:05 AM

## 2016-06-18 NOTE — Addendum Note (Signed)
Addendum  created 06/18/16 1407 by Sarahanne Novakowski, MD   Sign clinical note    

## 2016-06-18 NOTE — Anesthesia Postprocedure Evaluation (Signed)
Anesthesia Post Note  Patient: Marcus [name]  Procedure(s) Performed: Procedure(s) (LRB): RIGHT TOTAL HIP ARTHROPLASTY ANTERIOR APPROACH (Right)     Anesthesia Post Evaluation  Last Vitals:  Vitals:   05/16/16 1017 05/16/16 1255  BP: 136/66 (!) 153/92  Pulse: (!) 56 74  Resp: 15 14  Temp: 36.5 C 36.6 C    Last Pain:  Vitals:   05/16/16 1301  TempSrc:   PainSc: 6                  Fayth Trefry S

## 2018-06-03 ENCOUNTER — Other Ambulatory Visit: Payer: Self-pay

## 2018-06-03 ENCOUNTER — Encounter (HOSPITAL_COMMUNITY): Payer: Self-pay

## 2018-06-03 ENCOUNTER — Emergency Department (HOSPITAL_COMMUNITY)
Admission: EM | Admit: 2018-06-03 | Discharge: 2018-06-03 | Disposition: A | Discharge status: Home / Self Care | Payer: Medicare HMO | Attending: Emergency Medicine | Admitting: Emergency Medicine

## 2018-06-03 DIAGNOSIS — R339 Retention of urine, unspecified: Secondary | ICD-10-CM | POA: Diagnosis present

## 2018-06-03 DIAGNOSIS — I10 Essential (primary) hypertension: Secondary | ICD-10-CM | POA: Diagnosis not present

## 2018-06-03 DIAGNOSIS — Z96641 Presence of right artificial hip joint: Secondary | ICD-10-CM | POA: Diagnosis not present

## 2018-06-03 DIAGNOSIS — Z79899 Other long term (current) drug therapy: Secondary | ICD-10-CM | POA: Insufficient documentation

## 2018-06-03 DIAGNOSIS — Z8546 Personal history of malignant neoplasm of prostate: Secondary | ICD-10-CM | POA: Insufficient documentation

## 2018-06-03 DIAGNOSIS — N390 Urinary tract infection, site not specified: Secondary | ICD-10-CM | POA: Diagnosis not present

## 2018-06-03 DIAGNOSIS — F17210 Nicotine dependence, cigarettes, uncomplicated: Secondary | ICD-10-CM | POA: Diagnosis not present

## 2018-06-03 LAB — CBC WITH DIFFERENTIAL/PLATELET
Abs Immature Granulocytes: 0.01 10*3/uL (ref 0.00–0.07)
Basophils Absolute: 0 10*3/uL (ref 0.0–0.1)
Basophils Relative: 0 %
Eosinophils Absolute: 0.1 10*3/uL (ref 0.0–0.5)
Eosinophils Relative: 2 %
HCT: 37.5 % — ABNORMAL LOW (ref 39.0–52.0)
Hemoglobin: 12 g/dL — ABNORMAL LOW (ref 13.0–17.0)
Immature Granulocytes: 0 %
Lymphocytes Relative: 19 %
Lymphs Abs: 1.1 10*3/uL (ref 0.7–4.0)
MCH: 29.6 pg (ref 26.0–34.0)
MCHC: 32 g/dL (ref 30.0–36.0)
MCV: 92.6 fL (ref 80.0–100.0)
Monocytes Absolute: 0.6 10*3/uL (ref 0.1–1.0)
Monocytes Relative: 11 %
Neutro Abs: 3.7 10*3/uL (ref 1.7–7.7)
Neutrophils Relative %: 68 %
Platelets: 142 10*3/uL — ABNORMAL LOW (ref 150–400)
RBC: 4.05 MIL/uL — ABNORMAL LOW (ref 4.22–5.81)
RDW: 14.6 % (ref 11.5–15.5)
WBC: 5.5 10*3/uL (ref 4.0–10.5)
nRBC: 0 % (ref 0.0–0.2)

## 2018-06-03 LAB — URINALYSIS, ROUTINE W REFLEX MICROSCOPIC
Bacteria, UA: NONE SEEN
Bilirubin Urine: NEGATIVE
Glucose, UA: NEGATIVE mg/dL
Ketones, ur: NEGATIVE mg/dL
Nitrite: NEGATIVE
Protein, ur: NEGATIVE mg/dL
Specific Gravity, Urine: 1.015 (ref 1.005–1.030)
WBC, UA: >50 WBC/hpf — ABNORMAL HIGH (ref 0–5)
pH: 5 (ref 5.0–8.0)

## 2018-06-03 LAB — BASIC METABOLIC PANEL
Anion gap: 7 (ref 5–15)
BUN: 11 mg/dL (ref 8–23)
CO2: 24 mmol/L (ref 22–32)
Calcium: 9.2 mg/dL (ref 8.9–10.3)
Chloride: 105 mmol/L (ref 98–111)
Creatinine, Ser: 1.12 mg/dL (ref 0.61–1.24)
GFR calc Af Amer: >60 mL/min (ref >60)
GFR calc non Af Amer: >60 mL/min (ref >60)
Glucose, Bld: 99 mg/dL (ref 70–99)
Potassium: 3.5 mmol/L (ref 3.5–5.1)
Sodium: 136 mmol/L (ref 135–145)

## 2018-06-03 MED ORDER — CEFPODOXIME PROXETIL 100 MG PO TABS: 100.0000 mg | ORAL_TABLET | Freq: Two times a day (BID) | ORAL | 0 refills | Status: AC

## 2018-06-03 MED ORDER — SODIUM CHLORIDE 0.9 % IV BOLUS
1000.0000 mL | Freq: Once | INTRAVENOUS | Status: AC
  Administered 2018-06-03: 1000 mL via INTRAVENOUS

## 2018-06-03 MED ORDER — CEPHALEXIN 500 MG PO CAPS
500.0000 mg | ORAL_CAPSULE | Freq: Once | ORAL | Status: AC
  Administered 2018-06-03: 500 mg via ORAL
  Filled 2018-06-03: qty 1

## 2018-06-03 NOTE — ED Notes (Signed)
Bed: WA01 Expected date:  Expected time:  Means of arrival:  Comments: EMS-urine retention

## 2018-06-03 NOTE — ED Provider Notes (Signed)
[facility] DEPT Provider Note   CSN: [id number] Arrival date & time: 06/03/18  1250    History   Chief Complaint Chief Complaint  Patient presents with  . Urinary Retention    HPI Marcus [name] is a 73 y.o. male.     73 yo M with a chief complaint of increased urinary frequency dysuria and feeling that he does not completely empty his bladder.  This been going on since this morning.  Denies fevers or flank pain.  Has a remote history of prostate cancer.  No problems with urinary retention previously.  The history is provided by the patient.  Illness  Severity:  Mild Onset quality:  Sudden Duration:  1 day Timing:  Constant Progression:  Unchanged Chronicity:  New Associated symptoms: no abdominal pain, no chest pain, no congestion, no diarrhea, no fever, no headaches, no myalgias, no rash, no shortness of breath and no vomiting     Past Medical History:  Diagnosis Date  . Arthritis   . Depression   . History of transfusion    AS CHILD  . Hypertension   . Numbness and tingling of right arm   . 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 Prostate cancer ([place]) 09/04/13   Adenocarcinoma    Patient Active Problem List   Diagnosis Date Noted  . 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 Prostate cancer ([place]) 09/04/2013    Past Surgical History:  Procedure Laterality Date  . LYMPHADENECTOMY Bilateral 09/04/2013   Procedure: PELVIC LYMPH NODE DISSECTION;  Surgeon: Alexis Frock, MD;  Location: [facility] ORS;  Service: Urology;  Laterality: Bilateral;  . ROBOT ASSISTED LAPAROSCOPIC RADICAL PROSTATECTOMY N/A 09/04/2013   Procedure: ROBOTIC ASSISTED LAPAROSCOPIC RADICAL PROSTATECTOMY, INDOCYANINE GREEN DYE;  Surgeon: Alexis Frock, MD;  Location: [facility] ORS;  Service: Urology;  Laterality: N/A;  . TOTAL HIP ARTHROPLASTY Right 05/15/2016   Procedure: RIGHT TOTAL HIP ARTHROPLASTY ANTERIOR APPROACH;  Surgeon: Paralee Cancel, MD;  Location: [facility] ORS;   Service: Orthopedics;  Laterality: Right;  . WRIST GANGLION EXCISION          Home Medications    Prior to Admission medications   Medication Sig Start Date End Date Taking? Authorizing Provider  atorvastatin (LIPITOR) 40 MG tablet Take 40 mg by mouth every evening. 04/18/16   [provider]  cefpodoxime (VANTIN) 100 MG tablet Take 1 tablet (100 mg total) by mouth 2 (two) times daily for 14 days. 06/03/18 06/17/18  Deno Etienne, DO  docusate sodium (COLACE) 100 MG capsule Take 1 capsule (100 mg total) by mouth 2 (two) times daily. 05/15/16   Danae Orleans, PA-C  ferrous sulfate (FERROUSUL) 325 (65 FE) MG tablet Take 1 tablet (325 mg total) by mouth 3 (three) times daily with meals. 05/15/16   Danae Orleans, PA-C  HYDROcodone-acetaminophen (NORCO) 7.5-325 MG tablet Take 1-2 tablets by mouth every 4 (four) hours as needed for moderate pain or severe pain. 05/15/16   Danae Orleans, PA-C  Leuprolide Acetate, 6 Month, (LUPRON) 45 MG injection Inject 45 mg into the muscle every 6 (six) months. 04/20/14   [provider]  methocarbamol (ROBAXIN) 500 MG tablet Take 1 tablet (500 mg total) by mouth every 6 (six) hours as needed for muscle spasms. 05/15/16   Danae Orleans, PA-C  polyethylene glycol (MIRALAX / GLYCOLAX) packet Take 17 g by mouth 2 (two) times daily. 05/15/16   Danae Orleans, PA-C  triamterene-hydrochlorothiazide (MAXZIDE-25) 37.5-25 MG tablet Take 1 tablet  by mouth daily. 04/30/16   [provider]    Family History Family History  Problem Relation Age of Onset  . Seizures Mother   . Cancer Father        throat cancer    Social History Social History   Tobacco Use  . Smoking status: Current Every Day Smoker    Packs/day: 0.50    Types: Cigarettes  . Smokeless tobacco: Never Used  Substance Use Topics  . Alcohol use: Yes    Comment: 2- 40 ouonce beers per week   . Drug use: No    Comment: decreased to 3-3 daily     Allergies   Patient has no known  allergies.   Review of Systems Review of Systems  Constitutional: Negative for chills and fever.  HENT: Negative for congestion and facial swelling.   Eyes: Negative for discharge and visual disturbance.  Respiratory: Negative for shortness of breath.   Cardiovascular: Negative for chest pain and palpitations.  Gastrointestinal: Negative for abdominal pain, diarrhea and vomiting.  Genitourinary: Positive for decreased urine volume, dysuria, frequency and urgency.  Musculoskeletal: Negative for arthralgias and myalgias.  Skin: Negative for color change and rash.  Neurological: Negative for tremors, syncope and headaches.  Psychiatric/Behavioral: Negative for confusion and dysphoric mood.     Physical Exam Updated Vital Signs BP (!) 179/93 (BP Location: Right Arm)   Pulse 72   Temp 98.6 F (37 C) (Oral)   Resp 16   SpO2 100%   Physical Exam Vitals signs and nursing note reviewed.  Constitutional:      Appearance: He is well-developed.  HENT:     Head: Normocephalic and atraumatic.  Eyes:     Pupils: Pupils are equal, round, and reactive to light.  Neck:     Musculoskeletal: Normal range of motion and neck supple.     Vascular: No JVD.  Cardiovascular:     Rate and Rhythm: Normal rate and regular rhythm.     Heart sounds: No murmur. No friction rub. No gallop.   Pulmonary:     Effort: No respiratory distress.     Breath sounds: No wheezing.  Abdominal:     General: There is no distension.     Tenderness: There is no abdominal tenderness. There is no guarding or rebound.     Comments: No suprapubic tenderness no palpable bladder  Musculoskeletal: Normal range of motion.  Skin:    Coloration: Skin is not pale.     Findings: No rash.  Neurological:     Mental Status: He is alert and oriented to person, place, and time.  Psychiatric:        Behavior: Behavior normal.      ED Treatments / Results  Labs (all labs ordered are listed, but only abnormal results are  displayed) Labs Reviewed  URINALYSIS, ROUTINE W REFLEX MICROSCOPIC - Abnormal; Notable for the following components:      Result Value   APPearance HAZY (*)    Hgb urine dipstick MODERATE (*)    Leukocytes,Ua LARGE (*)    WBC, UA >50 (*)    All other components within normal limits  CBC WITH DIFFERENTIAL/PLATELET - Abnormal; Notable for the following components:   RBC 4.05 (*)    Hemoglobin 12.0 (*)    HCT 37.5 (*)    Platelets 142 (*)    All other components within normal limits  URINE CULTURE  BASIC METABOLIC PANEL    EKG None  Radiology No results found.  Procedures Procedures (including critical care time)  Medications Ordered in ED Medications  sodium chloride 0.9 % bolus 1,000 mL (1,000 mLs Intravenous New Bag/Given (Non-Interop) 06/03/18 1339)  cephALEXin (KEFLEX) capsule 500 mg (500 mg Oral Given 06/03/18 1412)     Initial Impression / Assessment and Plan / ED Course  I have reviewed the triage vital signs and the nursing notes.  Pertinent labs & imaging results that were available during my care of the patient were reviewed by me and considered in my medical decision making (see chart for details).        73 yo M with a chief complaint of dysuria increased frequency and hesitancy.  I suspect the patient has a urinary tract infection based on his symptoms.  Bladder scan with no significant amount of urine.  We will give a fluid bolus check basic lab work UA.  Patient's renal function appears to be at baseline.  No leukocytosis.  The urine does look concerning for urinary tract infection and it interesting that there is no bacteria that was seen on microscopic.  Will send off for culture.  Will treat with Vantin.  Urology follow-up.  2:26 PM:  I have discussed the diagnosis/risks/treatment options with the patient and believe the pt to be eligible for discharge home to follow-up with PCP. We also discussed returning to the ED immediately if new or worsening sx  occur. We discussed the sx which are most concerning (e.g., sudden worsening pain, fever, inability to tolerate by mouth) that necessitate immediate return. Medications administered to the patient during their visit and any new prescriptions provided to the patient are listed below.  Medications given during this visit Medications  sodium chloride 0.9 % bolus 1,000 mL (1,000 mLs Intravenous New Bag/Given (Non-Interop) 06/03/18 1339)  cephALEXin (KEFLEX) capsule 500 mg (500 mg Oral Given 06/03/18 1412)     The patient appears reasonably screen and/or stabilized for discharge and I doubt any other medical condition or other [facility] requiring further screening, evaluation, or treatment in the ED at this time prior to discharge.    Final Clinical Impressions(s) / ED Diagnoses   Final diagnoses:  Lower urinary tract infectious disease    ED Discharge Orders         Ordered    cefpodoxime (VANTIN) 100 MG tablet  2 times daily     06/03/18 [place], Lorriane Dehart, DO 06/03/18 1426

## 2018-06-03 NOTE — ED Triage Notes (Signed)
Pt BIB EMS from home. Pt reports unable urinate since 530 am. Pt reports hematuria and lower abdominal pressure. Pt had prostate surgery about a year ago and has not had any complications until now.

## 2018-06-03 NOTE — Discharge Instructions (Signed)
Follow up with your family doctor and urologist.

## 2018-06-05 LAB — URINE CULTURE: Culture: <10000 — AB

## 2018-09-14 ENCOUNTER — Encounter (HOSPITAL_COMMUNITY): Payer: Self-pay

## 2018-09-14 ENCOUNTER — Other Ambulatory Visit: Payer: Self-pay

## 2018-09-14 ENCOUNTER — Inpatient Hospital Stay (HOSPITAL_COMMUNITY)
Admission: EM | Admit: 2018-09-14 | Discharge: 2018-09-17 | DRG: 872 | Disposition: A | Discharge status: Home / Self Care | Payer: Medicare HMO | Attending: Internal Medicine | Admitting: Internal Medicine

## 2018-09-14 DIAGNOSIS — C61 Malignant neoplasm of prostate: Secondary | ICD-10-CM | POA: Diagnosis present

## 2018-09-14 DIAGNOSIS — Z9079 Acquired absence of other genital organ(s): Secondary | ICD-10-CM

## 2018-09-14 DIAGNOSIS — Z808 Family history of malignant neoplasm of other organs or systems: Secondary | ICD-10-CM

## 2018-09-14 DIAGNOSIS — R197 Diarrhea, unspecified: Secondary | ICD-10-CM | POA: Diagnosis present

## 2018-09-14 DIAGNOSIS — N10 Acute tubulo-interstitial nephritis: Secondary | ICD-10-CM

## 2018-09-14 DIAGNOSIS — E871 Hypo-osmolality and hyponatremia: Secondary | ICD-10-CM | POA: Diagnosis not present

## 2018-09-14 DIAGNOSIS — Z20828 Contact with and (suspected) exposure to other viral communicable diseases: Secondary | ICD-10-CM | POA: Diagnosis present

## 2018-09-14 DIAGNOSIS — Z7289 Other problems related to lifestyle: Secondary | ICD-10-CM | POA: Diagnosis not present

## 2018-09-14 DIAGNOSIS — F17210 Nicotine dependence, cigarettes, uncomplicated: Secondary | ICD-10-CM | POA: Diagnosis present

## 2018-09-14 DIAGNOSIS — F329 Major depressive disorder, single episode, unspecified: Secondary | ICD-10-CM | POA: Diagnosis present

## 2018-09-14 DIAGNOSIS — Z79899 Other long term (current) drug therapy: Secondary | ICD-10-CM

## 2018-09-14 DIAGNOSIS — E876 Hypokalemia: Secondary | ICD-10-CM | POA: Diagnosis present

## 2018-09-14 DIAGNOSIS — M8888 Osteitis deformans of other bones: Secondary | ICD-10-CM | POA: Diagnosis present

## 2018-09-14 DIAGNOSIS — M6282 Rhabdomyolysis: Secondary | ICD-10-CM | POA: Diagnosis present

## 2018-09-14 DIAGNOSIS — A4159 Other Gram-negative sepsis: Secondary | ICD-10-CM | POA: Diagnosis present

## 2018-09-14 DIAGNOSIS — N179 Acute kidney failure, unspecified: Secondary | ICD-10-CM | POA: Diagnosis present

## 2018-09-14 DIAGNOSIS — Z96641 Presence of right artificial hip joint: Secondary | ICD-10-CM | POA: Diagnosis present

## 2018-09-14 DIAGNOSIS — N390 Urinary tract infection, site not specified: Secondary | ICD-10-CM | POA: Diagnosis present

## 2018-09-14 DIAGNOSIS — Z87440 Personal history of urinary (tract) infections: Secondary | ICD-10-CM

## 2018-09-14 DIAGNOSIS — I10 Essential (primary) hypertension: Secondary | ICD-10-CM | POA: Diagnosis present

## 2018-09-14 DIAGNOSIS — E860 Dehydration: Secondary | ICD-10-CM | POA: Diagnosis present

## 2018-09-14 LAB — COMPREHENSIVE METABOLIC PANEL
ALT: 49 U/L — ABNORMAL HIGH (ref 0–44)
AST: 99 U/L — ABNORMAL HIGH (ref 15–41)
Albumin: 3.7 g/dL (ref 3.5–5.0)
Alkaline Phosphatase: 84 U/L (ref 38–126)
Anion gap: 16 — ABNORMAL HIGH (ref 5–15)
BUN: 18 mg/dL (ref 8–23)
CO2: 18 mmol/L — ABNORMAL LOW (ref 22–32)
Calcium: 8.7 mg/dL — ABNORMAL LOW (ref 8.9–10.3)
Chloride: 95 mmol/L — ABNORMAL LOW (ref 98–111)
Creatinine, Ser: 1.93 mg/dL — ABNORMAL HIGH (ref 0.61–1.24)
GFR calc Af Amer: 40 mL/min — ABNORMAL LOW (ref >60)
GFR calc non Af Amer: 34 mL/min — ABNORMAL LOW (ref >60)
Glucose, Bld: 129 mg/dL — ABNORMAL HIGH (ref 70–99)
Potassium: 3.2 mmol/L — ABNORMAL LOW (ref 3.5–5.1)
Sodium: 129 mmol/L — ABNORMAL LOW (ref 135–145)
Total Bilirubin: 1.1 mg/dL (ref 0.3–1.2)
Total Protein: 8.6 g/dL — ABNORMAL HIGH (ref 6.5–8.1)

## 2018-09-14 LAB — URINALYSIS, ROUTINE W REFLEX MICROSCOPIC
Bacteria, UA: NONE SEEN
Bilirubin Urine: NEGATIVE
Glucose, UA: NEGATIVE mg/dL
Ketones, ur: NEGATIVE mg/dL
Nitrite: NEGATIVE
Protein, ur: 100 mg/dL — AB
Specific Gravity, Urine: 1.015 (ref 1.005–1.030)
WBC, UA: >50 WBC/hpf — ABNORMAL HIGH (ref 0–5)
pH: 5 (ref 5.0–8.0)

## 2018-09-14 LAB — CK: Total CK: 1094 U/L — ABNORMAL HIGH (ref 49–397)

## 2018-09-14 LAB — CBC WITH DIFFERENTIAL/PLATELET
Abs Immature Granulocytes: 0.12 10*3/uL — ABNORMAL HIGH (ref 0.00–0.07)
Basophils Absolute: 0 10*3/uL (ref 0.0–0.1)
Basophils Relative: 0 %
Eosinophils Absolute: 0 10*3/uL (ref 0.0–0.5)
Eosinophils Relative: 0 %
HCT: 37.6 % — ABNORMAL LOW (ref 39.0–52.0)
Hemoglobin: 12.6 g/dL — ABNORMAL LOW (ref 13.0–17.0)
Immature Granulocytes: 1 %
Lymphocytes Relative: 7 %
Lymphs Abs: 0.7 10*3/uL (ref 0.7–4.0)
MCH: 29.6 pg (ref 26.0–34.0)
MCHC: 33.5 g/dL (ref 30.0–36.0)
MCV: 88.5 fL (ref 80.0–100.0)
Monocytes Absolute: 0.7 10*3/uL (ref 0.1–1.0)
Monocytes Relative: 7 %
Neutro Abs: 8.5 10*3/uL — ABNORMAL HIGH (ref 1.7–7.7)
Neutrophils Relative %: 85 %
Platelets: 172 10*3/uL (ref 150–400)
RBC: 4.25 MIL/uL (ref 4.22–5.81)
RDW: 16.1 % — ABNORMAL HIGH (ref 11.5–15.5)
WBC: 10.1 10*3/uL (ref 4.0–10.5)
nRBC: 0 % (ref 0.0–0.2)

## 2018-09-14 LAB — LIPASE, BLOOD: Lipase: 36 U/L (ref 11–51)

## 2018-09-14 MED ORDER — CIPROFLOXACIN IN D5W 400 MG/200ML IV SOLN
400.0000 mg | Freq: Once | INTRAVENOUS | Status: AC
  Administered 2018-09-14: 21:00:00 400 mg via INTRAVENOUS
  Filled 2018-09-14: qty 200

## 2018-09-14 MED ORDER — SODIUM CHLORIDE 0.9 % IV SOLN
Freq: Once | INTRAVENOUS | Status: AC
  Administered 2018-09-14: 21:00:00 via INTRAVENOUS

## 2018-09-14 MED ORDER — ACETAMINOPHEN 325 MG PO TABS
650.0000 mg | ORAL_TABLET | Freq: Once | ORAL | Status: AC | PRN
  Administered 2018-09-14: 17:00:00 650 mg via ORAL
  Filled 2018-09-14: qty 2

## 2018-09-14 MED ORDER — SODIUM CHLORIDE 0.9 % IV SOLN
INTRAVENOUS | Status: DC
  Administered 2018-09-14 – 2018-09-17 (×3): via INTRAVENOUS

## 2018-09-14 NOTE — ED Provider Notes (Signed)
[place] DEPT Provider Note   CSN: [id number] Arrival date & time: 09/14/18  1616     History   Chief Complaint Chief Complaint  Patient presents with  . Hip Pain    bilateral  . Fatigue    HPI Marcus [name] is a 73 y.o. male.     HPI Patient presents with concern of bilateral thigh pain, discolored urine, feces, and fatigue. Patient states that he is generally well, denies substantial medical problems per 1 week ago, after spending a power washer with a friend, he began to feel soreness in both legs, proximal thighs, and pelvis. He has continued to be ambulatory, without weakness in his leg. Over the subsequent week he has developed new dark urine, matching the color of his stool. No abdominal pain, and he is unaware of a fever. No chest pain, no dyspnea. There is, however, fatigue. No new medication taken for relief. Past Medical History:  Diagnosis Date  . Arthritis   . Depression   . History of transfusion    AS CHILD  . Hypertension   . Numbness and tingling of right arm   . 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 Prostate cancer ([place]) 09/04/2013    Past Surgical History:  Procedure Laterality Date  . LYMPHADENECTOMY Bilateral 09/04/2013   Procedure: PELVIC LYMPH NODE DISSECTION;  Surgeon: Alexis Frock, MD;  Location: [facility];  Service: Urology;  Laterality: Bilateral;  . ROBOT ASSISTED LAPAROSCOPIC RADICAL PROSTATECTOMY N/A 09/04/2013   Procedure: ROBOTIC ASSISTED LAPAROSCOPIC RADICAL PROSTATECTOMY, INDOCYANINE GREEN DYE;  Surgeon: Alexis Frock, MD;  Location: [facility];  Service: Urology;  Laterality: N/A;  . TOTAL HIP ARTHROPLASTY Right 05/15/2016   Procedure: RIGHT TOTAL HIP ARTHROPLASTY ANTERIOR APPROACH;  Surgeon: Paralee Cancel, MD;  Location: [facility];  Service:  Orthopedics;  Laterality: Right;  . WRIST GANGLION EXCISION          Home Medications    Prior to Admission medications   Medication Sig Start Date End Date Taking? Authorizing Provider  atorvastatin (LIPITOR) 40 MG tablet Take 40 mg by mouth every evening. 04/18/16  Yes [provider]  triamterene-hydrochlorothiazide (MAXZIDE-25) 37.5-25 MG tablet Take 1 tablet by mouth daily. 04/30/16  Yes [provider]  docusate sodium (COLACE) 100 MG capsule Take 1 capsule (100 mg total) by mouth 2 (two) times daily. Patient not taking: Reported on 09/14/2018 05/15/16   Danae Orleans, PA-C  ferrous sulfate (FERROUSUL) 325 (65 FE) MG tablet Take 1 tablet (325 mg total) by mouth 3 (three) times daily with meals. Patient not taking: Reported on 09/14/2018 05/15/16   Danae Orleans, PA-C  HYDROcodone-acetaminophen (NORCO) 7.5-325 MG tablet Take 1-2 tablets by mouth every 4 (four) hours as needed for moderate pain or severe pain. Patient not taking: Reported on 09/14/2018 05/15/16   Danae Orleans, PA-C  methocarbamol (ROBAXIN) 500 MG tablet Take 1 tablet (500 mg total) by mouth every 6 (six) hours as needed for muscle spasms. Patient not taking: Reported on 09/14/2018 05/15/16   Danae Orleans, PA-C  polyethylene glycol (MIRALAX / GLYCOLAX) packet Take 17 g by mouth 2 (two) times daily. Patient not taking: Reported on 09/14/2018 05/15/16   Danae Orleans, PA-C    Family History Family History  Problem Relation Age of Onset  . Seizures Mother   .  Cancer Father        throat cancer    Social History Social History   Tobacco Use  . Smoking status: Current Every Day Smoker    Packs/day: 0.50    Types: Cigarettes  . Smokeless tobacco: Never Used  Substance Use Topics  . Alcohol use: Yes    Comment: 2- 40 ouonce beers per week   . Drug use: No    Comment: decreased to 3-3 daily     Allergies   Patient has no known allergies.   Review of Systems Review of Systems   Constitutional:       Per HPI, otherwise negative  HENT:       Per HPI, otherwise negative  Respiratory:       Per HPI, otherwise negative  Cardiovascular:       Per HPI, otherwise negative  Gastrointestinal: Negative for vomiting.  Endocrine:       Negative aside from HPI  Genitourinary:       Neg aside from HPI   Musculoskeletal:       Per HPI, otherwise negative  Skin: Negative.   Neurological: Positive for weakness. Negative for syncope.     Physical Exam Updated Vital Signs BP (!) 149/70 (BP Location: Right Arm)   Pulse 86   Temp 99.6 F (37.6 C) (Oral)   Resp 15   Ht 5\' 10"  (1.778 m)   Wt 74.8 kg   SpO2 100%   BMI 23.68 kg/m   Physical Exam Vitals signs and nursing note reviewed.  Constitutional:      General: He is not in acute distress.    Appearance: He is well-developed.  HENT:     Head: Normocephalic and atraumatic.  Eyes:     Conjunctiva/sclera: Conjunctivae normal.  Cardiovascular:     Rate and Rhythm: Normal rate and regular rhythm.  Pulmonary:     Effort: Pulmonary effort is normal. No respiratory distress.     Breath sounds: No stridor.  Abdominal:     Tenderness: There is no abdominal tenderness. There is no guarding.  Musculoskeletal:     Comments: The patient describes pain in the anterior aspect of both hips, and proximal anterior thighs, there is no limited range of motion, no decreased strength, no appreciable deformity bilaterally.  Skin:    General: Skin is warm and dry.  Neurological:     Mental Status: He is alert and oriented to person, place, and time.      ED Treatments / Results  Labs (all labs ordered are listed, but only abnormal results are displayed) Labs Reviewed  COMPREHENSIVE METABOLIC PANEL - Abnormal; Notable for the following components:      Result Value   Sodium 129 (*)    Potassium 3.2 (*)    Chloride 95 (*)    CO2 18 (*)    Glucose, Bld 129 (*)    Creatinine, Ser 1.93 (*)    Calcium 8.7 (*)    Total  Protein 8.6 (*)    AST 99 (*)    ALT 49 (*)    GFR calc non Af Amer 34 (*)    GFR calc Af Amer 40 (*)    Anion gap 16 (*)    All other components within normal limits  CBC WITH DIFFERENTIAL/PLATELET - Abnormal; Notable for the following components:   Hemoglobin 12.6 (*)    HCT 37.6 (*)    RDW 16.1 (*)    Neutro Abs 8.5 (*)  Abs Immature Granulocytes 0.12 (*)    All other components within normal limits  URINALYSIS, ROUTINE W REFLEX MICROSCOPIC - Abnormal; Notable for the following components:   Color, Urine AMBER (*)    APPearance CLOUDY (*)    Hgb urine dipstick MODERATE (*)    Protein, ur 100 (*)    Leukocytes,Ua LARGE (*)    WBC, UA >50 (*)    All other components within normal limits  CK - Abnormal; Notable for the following components:   Total CK 1,094 (*)    All other components within normal limits  SARS CORONAVIRUS 2 (TAT 6-12 HRS)  LIPASE, BLOOD    EKG None  Radiology No results found.  Procedures Procedures (including critical care time)  Medications Ordered in ED Medications  0.9 %  sodium chloride infusion ( Intravenous New Bag/Given 09/14/18 1858)  acetaminophen (TYLENOL) tablet 650 mg (650 mg Oral Given 09/14/18 1729)  0.9 %  sodium chloride infusion ( Intravenous New Bag/Given 09/14/18 2032)  ciprofloxacin (CIPRO) IVPB 400 mg (400 mg Intravenous New Bag/Given 09/14/18 2032)     Initial Impression / Assessment and Plan / ED Course  I have reviewed the triage vital signs and the nursing notes.  Pertinent labs & imaging results that were available during my care of the patient were reviewed by me and considered in my medical decision making (see chart for details).    Patient febrile with initial evaluation, will receive Tylenol.  Initial findings concerning for acute kidney injury.  Patient also found to have hyponatremia.   Patient CK 1000+ 10:05 PM On repeat exam the patient is in similar condition. He is receiving ciprofloxacin, fluids.   This elderly male presents with ongoing pain in his upper thighs, is found be febrile, tachycardic, with evidence for some rhabdomyolysis, with acute kidney injury, concurrent UTI. Patient has no cutaneous changes, no evidence of bacteremia, sepsis, but given his substantial abnormalities, advanced age, after initiation of fluid resuscitation, antibiotics, the patient was admitted for further monitoring, management.  Final Clinical Impressions(s) / ED Diagnoses   Final diagnoses:  AKI (acute kidney injury) ([place])  Lower urinary tract infectious disease  Non-traumatic rhabdomyolysis     Carmin Muskrat, MD 09/14/18 2207

## 2018-09-14 NOTE — H&P (Signed)
History and Physical   Marcus [name] [id number] DOB: Jan 18, 1945 DOA: 09/14/2018  Referring MD/NP/PA: Dr. Vanita Panda  PCP: Rogers Blocker, MD   Outpatient Specialists: None  Patient coming from: Home  Chief Complaint: Fatigue and bilateral hip pain  HPI: Marcus [name] is a 73 y.o. 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  Also generalized weakness as well as strongly discolored urine.  Patient has been weak and not able to do his normal chores.  He has some abdominal cramps but no significant pain.  He also felt like he was febrile but has not recorded it at home.  He has had at least 3 episodes of diarrhea today.  No sick contacts.  No exposure to any person was COVID-19.  Patient came to the ER where he was seen and evaluated.  He was found to have evidence of UTI, significant dehydration and electrolyte abnormalities.  Patient is therefore being admitted to the hospital for treatment..  ED Course: Temperature is 103 blood pressure 166/68 pulse 90 respirate 25 oxygen sat 64% on room air initially currently 100% on room air.  White count is 10.1 hemoglobin 12.6 and platelet 172.  Sodium 129 potassium 3.2 chloride 95 CO2 of 18 with creatinine 1.98 and BUN 18 calcium 8.7.  CK is 1094, COVID-19 screen is pending.  Urinalysis showed cloudy urine with moderate hemoglobin and large leukocytes.  RBC 11-20 WBC clumps are more than 50.  Urine culture and blood cultures obtained.  Patient be admitted for treatment.  Review of Systems: As per HPI otherwise 10 point review of systems negative.    Past Medical History:  Diagnosis Date  . Arthritis   . Depression   . History of transfusion    AS CHILD  . Hypertension   . Numbness and tingling of right arm   . 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 Prostate cancer ([place])  09/04/13   Adenocarcinoma    Past Surgical History:  Procedure Laterality Date  . LYMPHADENECTOMY Bilateral 09/04/2013   Procedure: PELVIC LYMPH NODE DISSECTION;  Surgeon: Alexis Frock, MD;  Location: [facility];  Service: Urology;  Laterality: Bilateral;  . ROBOT ASSISTED LAPAROSCOPIC RADICAL PROSTATECTOMY N/A 09/04/2013   Procedure: ROBOTIC ASSISTED LAPAROSCOPIC RADICAL PROSTATECTOMY, INDOCYANINE GREEN DYE;  Surgeon: Alexis Frock, MD;  Location: [facility];  Service: Urology;  Laterality: N/A;  . TOTAL HIP ARTHROPLASTY Right 05/15/2016   Procedure: RIGHT TOTAL HIP ARTHROPLASTY ANTERIOR APPROACH;  Surgeon: Paralee Cancel, MD;  Location: [facility];  Service: Orthopedics;  Laterality: Right;  . WRIST GANGLION EXCISION       reports that he has been smoking cigarettes. He has been smoking about 0.50 packs per day. He has never used smokeless tobacco. He reports current alcohol use. He reports that he does not use drugs.  No Known Allergies  Family History  Problem Relation Age of Onset  . Seizures Mother   . Cancer Father        throat cancer     Prior to Admission medications   Medication Sig Start Date End Date Taking? Authorizing Provider  atorvastatin (LIPITOR) 40 MG tablet Take 40 mg by mouth every evening. 04/18/16  Yes [provider]  triamterene-hydrochlorothiazide (MAXZIDE-25) 37.5-25 MG tablet Take 1 tablet by mouth daily. 04/30/16  Yes [provider]  docusate sodium (COLACE) 100 MG capsule Take 1 capsule (100 mg total) by mouth 2 (two) times daily. Patient not taking: Reported on 09/14/2018 05/15/16   Danae Orleans, PA-C  ferrous sulfate (FERROUSUL) 325 (65 FE) MG tablet Take 1 tablet (325 mg total) by mouth 3 (three) times daily with meals. Patient not taking: Reported on 09/14/2018 05/15/16   Danae Orleans, PA-C  HYDROcodone-acetaminophen (NORCO) 7.5-325 MG tablet Take 1-2 tablets by mouth every 4 (four) hours as needed for moderate pain or severe pain. Patient not  taking: Reported on 09/14/2018 05/15/16   Danae Orleans, PA-C  methocarbamol (ROBAXIN) 500 MG tablet Take 1 tablet (500 mg total) by mouth every 6 (six) hours as needed for muscle spasms. Patient not taking: Reported on 09/14/2018 05/15/16   Danae Orleans, PA-C  polyethylene glycol (MIRALAX / GLYCOLAX) packet Take 17 g by mouth 2 (two) times daily. Patient not taking: Reported on 09/14/2018 05/15/16   Danae Orleans, PA-C    Physical Exam: Vitals:   09/14/18 2132 09/14/18 2200 09/14/18 2300 09/14/18 2330  BP: (!) 149/70 (!) 159/74 (!) 145/64 (!) 166/68  Pulse: 86 85 94 88  Resp: 15     Temp:      TempSrc:      SpO2: 100% 100% (!) 64% 96%  Weight:      Height:          Constitutional: NAD, calm, acutely ill looking Vitals:   09/14/18 2132 09/14/18 2200 09/14/18 2300 09/14/18 2330  BP: (!) 149/70 (!) 159/74 (!) 145/64 (!) 166/68  Pulse: 86 85 94 88  Resp: 15     Temp:      TempSrc:      SpO2: 100% 100% (!) 64% 96%  Weight:      Height:       Eyes: PERRL, lids and conjunctivae normal ENMT: Mucous membranes are dry. Posterior pharynx clear of any exudate or lesions.Normal dentition.  Neck: normal, supple, no masses, no thyromegaly Respiratory: clear to auscultation bilaterally, no wheezing, no crackles. Normal respiratory effort. No accessory muscle use.  Cardiovascular: Tachycardic, no murmurs / rubs / gallops. No extremity edema. 2+ pedal pulses. No carotid bruits.  Abdomen: no tenderness, no masses palpated. No hepatosplenomegaly. Bowel sounds positive.  Musculoskeletal: no clubbing / cyanosis. No joint deformity upper and lower extremities. Good ROM, no contractures. Normal muscle tone.  Skin: no rashes, lesions, ulcers. No induration Neurologic: CN 2-12 grossly intact. Sensation intact, DTR normal. Strength 5/5 in all 4.  Psychiatric: Normal judgment and insight. Alert and oriented x 3. Normal mood.     Labs on Admission: I have personally reviewed following labs and  imaging studies  CBC: Recent Labs  Lab 09/14/18 1838  WBC 10.1  NEUTROABS 8.5*  HGB 12.6*  HCT 37.6*  MCV 88.5  PLT [id number]   Basic Metabolic Panel: Recent Labs  Lab 09/14/18 1838  NA 129*  K 3.2*  CL 95*  CO2 18*  GLUCOSE 129*  BUN 18  CREATININE 1.93*  CALCIUM 8.7*   GFR: Estimated Creatinine Clearance: 36.8 mL/min (A) (by C-G formula based on SCr of 1.93 mg/dL (H)). Liver Function Tests: Recent Labs  Lab 09/14/18 1838  AST 99*  ALT 49*  ALKPHOS 84  BILITOT 1.1  PROT 8.6*  ALBUMIN 3.7   Recent Labs  Lab 09/14/18 1838  LIPASE 36   No results for input(s): AMMONIA in the last 168 hours. Coagulation Profile: No results for input(s): INR, PROTIME in the last 168 hours. Cardiac  Enzymes: Recent Labs  Lab 09/14/18 1838  CKTOTAL 1,094*   BNP (last 3 results) No results for input(s): PROBNP in the last 8760 hours. HbA1C: No results for input(s): HGBA1C in the last 72 hours. CBG: No results for input(s): GLUCAP in the last 168 hours. Lipid Profile: No results for input(s): CHOL, HDL, LDLCALC, TRIG, CHOLHDL, LDLDIRECT in the last 72 hours. Thyroid Function Tests: No results for input(s): TSH, T4TOTAL, FREET4, T3FREE, THYROIDAB in the last 72 hours. Anemia Panel: No results for input(s): VITAMINB12, FOLATE, FERRITIN, TIBC, IRON, RETICCTPCT in the last 72 hours. Urine analysis:    Component Value Date/Time   COLORURINE AMBER (A) 09/14/2018 1838   APPEARANCEUR CLOUDY (A) 09/14/2018 1838   LABSPEC 1.015 09/14/2018 1838   LABSPEC 1.010 07/09/2014 0950   PHURINE 5.0 09/14/2018 1838   GLUCOSEU NEGATIVE 09/14/2018 1838   GLUCOSEU Negative 07/09/2014 0950   HGBUR MODERATE (A) 09/14/2018 1838   BILIRUBINUR NEGATIVE 09/14/2018 1838   BILIRUBINUR Negative 07/09/2014 0950   KETONESUR NEGATIVE 09/14/2018 1838   PROTEINUR 100 (A) 09/14/2018 1838   UROBILINOGEN 0.2 07/09/2014 0950   NITRITE NEGATIVE 09/14/2018 1838   LEUKOCYTESUR LARGE (A) 09/14/2018 1838    LEUKOCYTESUR Negative 07/09/2014 0950   Sepsis Labs: @LABRCNTIP (procalcitonin:4,lacticidven:4) )No results found for this or any previous visit (from the past 240 hour(s)).   Radiological Exams on Admission: No results found.  Assessment/Plan Principal Problem:   Acute pyelonephritis Active Problems:   Prostate cancer ([place])   Hyponatremia   Hypokalemia   ARF (acute renal failure) ([place])     #1 acute pyelonephritis: Patient symptoms consistent with pyelonephritis.  We will admit the patient and initiate IV Rocephin and IV fluids.  We will get renal ultrasound to better characterize his lesion.  He has acute kidney injury probably secondary to his diarrhea as well.  His urinalysis does not show much of bacteria so abscess is possible.  We will follow after renal ultrasound or renal CT.  #2 hyponatremia: Secondary to dehydration.  Aggressively hydrate and monitor sodium level.  #3 hypokalemia: Replete potassium.  #4 acute kidney injury: Prerenal most likely.  With a history of prostate cancer we may need to rule out post renal causes as well.  Follow renal ultrasound.  #5 history of prostate cancer: Continue treatment.   DVT prophylaxis: Lovenox Code Status: Full Family Communication: No family at bed Disposition Plan: To be determined Consults called: None Admission status: Inpatient  Severity of Illness: The appropriate patient status for this patient is INPATIENT. Inpatient status is judged to be reasonable and necessary in order to provide the required intensity of service to ensure the patient's safety. The patient's presenting symptoms, physical exam findings, and initial radiographic and laboratory data in the context of their chronic comorbidities is felt to place them at high risk for further clinical deterioration. Furthermore, it is not anticipated that the patient will be medically stable for discharge from the hospital within 2 midnights of admission. The following  factors support the patient status of inpatient.   " The patient's presenting symptoms include fever and weakness. " The worrisome physical exam findings include evidence of dehydration. " The initial radiographic and laboratory data are worrisome because of evidence of UTI and electrolyte of normalities. " The chronic co-morbidities include history of prosthetic.   * I certify that at the point of admission it is my clinical judgment that the patient will require inpatient hospital care spanning beyond 2 midnights from the point of admission due to  high intensity of service, high risk for further deterioration and high frequency of surveillance required.Barbette Merino MD [facility] Pager [phone]  If 7PM-7AM, please contact night-coverage [email or web address] Password [email or web address]  09/15/2018, 12:16 AM

## 2018-09-14 NOTE — ED Triage Notes (Signed)
Per EMS, patient from home, c/o abdominal pain and bilateral hip pain x1 week since power washing. States pain worsens with movement. Denies N/V/D. Denies fevers and cough.

## 2018-09-14 NOTE — ED Notes (Signed)
[facility] for discharge

## 2018-09-15 ENCOUNTER — Inpatient Hospital Stay (HOSPITAL_COMMUNITY): Payer: Medicare HMO

## 2018-09-15 DIAGNOSIS — E871 Hypo-osmolality and hyponatremia: Secondary | ICD-10-CM

## 2018-09-15 DIAGNOSIS — N179 Acute kidney failure, unspecified: Secondary | ICD-10-CM

## 2018-09-15 DIAGNOSIS — C61 Malignant neoplasm of prostate: Secondary | ICD-10-CM

## 2018-09-15 DIAGNOSIS — E876 Hypokalemia: Secondary | ICD-10-CM

## 2018-09-15 DIAGNOSIS — N390 Urinary tract infection, site not specified: Secondary | ICD-10-CM

## 2018-09-15 LAB — CBC
HCT: 34.2 % — ABNORMAL LOW (ref 39.0–52.0)
Hemoglobin: 11.1 g/dL — ABNORMAL LOW (ref 13.0–17.0)
MCH: 29.5 pg (ref 26.0–34.0)
MCHC: 32.5 g/dL (ref 30.0–36.0)
MCV: 91 fL (ref 80.0–100.0)
Platelets: 148 10*3/uL — ABNORMAL LOW (ref 150–400)
RBC: 3.76 MIL/uL — ABNORMAL LOW (ref 4.22–5.81)
RDW: 16.3 % — ABNORMAL HIGH (ref 11.5–15.5)
WBC: 9.9 10*3/uL (ref 4.0–10.5)
nRBC: 0 % (ref 0.0–0.2)

## 2018-09-15 LAB — COMPREHENSIVE METABOLIC PANEL
ALT: 54 U/L — ABNORMAL HIGH (ref 0–44)
AST: 109 U/L — ABNORMAL HIGH (ref 15–41)
Albumin: 3.2 g/dL — ABNORMAL LOW (ref 3.5–5.0)
Alkaline Phosphatase: 76 U/L (ref 38–126)
Anion gap: 12 (ref 5–15)
BUN: 20 mg/dL (ref 8–23)
CO2: 16 mmol/L — ABNORMAL LOW (ref 22–32)
Calcium: 8 mg/dL — ABNORMAL LOW (ref 8.9–10.3)
Chloride: 102 mmol/L (ref 98–111)
Creatinine, Ser: 1.64 mg/dL — ABNORMAL HIGH (ref 0.61–1.24)
GFR calc Af Amer: 48 mL/min — ABNORMAL LOW (ref >60)
GFR calc non Af Amer: 42 mL/min — ABNORMAL LOW (ref >60)
Glucose, Bld: 121 mg/dL — ABNORMAL HIGH (ref 70–99)
Potassium: 3.3 mmol/L — ABNORMAL LOW (ref 3.5–5.1)
Sodium: 130 mmol/L — ABNORMAL LOW (ref 135–145)
Total Bilirubin: 1 mg/dL (ref 0.3–1.2)
Total Protein: 7.1 g/dL (ref 6.5–8.1)

## 2018-09-15 LAB — HIV ANTIBODY (ROUTINE TESTING W REFLEX): HIV Screen 4th Generation wRfx: NONREACTIVE

## 2018-09-15 LAB — SARS CORONAVIRUS 2 (TAT 6-24 HRS): SARS Coronavirus 2: NEGATIVE

## 2018-09-15 MED ORDER — SODIUM CHLORIDE 0.9 % IV SOLN
INTRAVENOUS | Status: DC
  Administered 2018-09-15: 01:00:00 via INTRAVENOUS

## 2018-09-15 MED ORDER — ACETAMINOPHEN 325 MG PO TABS
650.0000 mg | ORAL_TABLET | Freq: Four times a day (QID) | ORAL | Status: DC | PRN
  Administered 2018-09-15: 02:00:00 650 mg via ORAL
  Filled 2018-09-15 (×2): qty 2

## 2018-09-15 MED ORDER — ONDANSETRON HCL 4 MG PO TABS: 4.0000 mg | ORAL_TABLET | Freq: Four times a day (QID) | ORAL | Status: DC | PRN

## 2018-09-15 MED ORDER — SODIUM CHLORIDE 0.9 % IV SOLN
1.0000 g | Freq: Every day | INTRAVENOUS | Status: DC
  Administered 2018-09-15 – 2018-09-16 (×3): 1 g via INTRAVENOUS
  Filled 2018-09-15: qty 1
  Filled 2018-09-15: qty 10
  Filled 2018-09-15 (×2): qty 1

## 2018-09-15 MED ORDER — METHOCARBAMOL 500 MG PO TABS: 500.0000 mg | ORAL_TABLET | Freq: Four times a day (QID) | ORAL | Status: DC | PRN

## 2018-09-15 MED ORDER — ACETAMINOPHEN 325 MG PO TABS
650.0000 mg | ORAL_TABLET | ORAL | Status: DC | PRN
  Administered 2018-09-15 – 2018-09-16 (×2): 650 mg via ORAL
  Filled 2018-09-15 (×2): qty 2

## 2018-09-15 MED ORDER — ENOXAPARIN SODIUM 40 MG/0.4ML ~~LOC~~ SOLN
40.0000 mg | SUBCUTANEOUS | Status: DC
  Administered 2018-09-15 – 2018-09-17 (×3): 40 mg via SUBCUTANEOUS
  Filled 2018-09-15 (×3): qty 0.4

## 2018-09-15 MED ORDER — ATORVASTATIN CALCIUM 40 MG PO TABS
40.0000 mg | ORAL_TABLET | Freq: Every evening | ORAL | Status: DC
  Administered 2018-09-15 – 2018-09-16 (×2): 40 mg via ORAL
  Filled 2018-09-15 (×2): qty 1

## 2018-09-15 MED ORDER — ACETAMINOPHEN 325 MG PO TABS
650.0000 mg | ORAL_TABLET | Freq: Once | ORAL | Status: AC
  Administered 2018-09-15: 05:00:00 650 mg via ORAL

## 2018-09-15 MED ORDER — OXYCODONE HCL 5 MG PO TABS: 5.0000 mg | ORAL_TABLET | ORAL | Status: DC | PRN

## 2018-09-15 MED ORDER — ONDANSETRON HCL 4 MG/2ML IJ SOLN: 4.0000 mg | Freq: Four times a day (QID) | INTRAMUSCULAR | Status: DC | PRN

## 2018-09-15 NOTE — Progress Notes (Signed)
Patient arrived to [facility]. A X O X 4. No complaints of pain. Temp 101.3 Patient also reports SOB and cough for 3 months. Covid test not resulted yet. [facility] paged for further actions. Skin tear from ED, patient pulled out IV to L arm. Patient oriented to room and staff. Will contine to monitor.

## 2018-09-15 NOTE — Progress Notes (Addendum)
PROGRESS NOTE  Marcus [name] [id number] DOB: [birth year] DOA: 09/14/2018 PCP: Rogers Blocker, MD   LOS: 1 day   Brief narrative: Marcus [name] is a 73 y.o. 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  Also generalized weakness as well as strongly discolored urine.  Patient has been weak and not able to do his normal chores.  He has some abdominal cramps but no significant pain.  He also felt like he was febrile but has not recorded it at home.   No exposure to any person with COVID-19.  Patient came to the ER where he was seen and evaluated.  He was found to have evidence of UTI, significant dehydration and electrolyte abnormalities.  Patient was therefore being admitted to the hospital for treatment..  In the ED, Temperature was 103 F,  blood pressure 166/68 pulse 90 respirate 25 oxygen sat 64% on room air initially currently 100% on room air.  White count is 10.1 hemoglobin 12.6 and platelet 172.  Sodium 129 potassium 3.2 chloride 95 CO2 of 18 with creatinine 1.98 and BUN 18 calcium 8.7.  CK is 1094, COVID-19 screen- pending  Urinalysis showed cloudy urine with moderate hemoglobin and large leukocytes.  RBC 11-20 WBC clumps are more than 50.  Urine culture and blood cultures were obtained.    Assessment/Plan:  Principal Problem:   Acute lower UTI Active Problems:   Prostate cancer ([place])   Hyponatremia   Hypokalemia   ARF (acute renal failure) (HCC)  High-grade fever/urinary tract infection.    Patient presented with fever and abnormal urinalysis.  Will empirically continue on Rocephin for now.  Will get blood cultures as well.  WBC 9.9 today.  Pending urine culture.  Pending consultation.  Mild hyponatremia likely secondary to volume depletion.  Continue on IV fluid hydration.  Check BMP in a.m.  Currently on normal saline  125 mL/h.  We will continue with that for now.  Volume depletion on presentation.  Likely secondary to diarrhea.  Continue with IV fluid hydration.  Hypokalemia.  Will replenish with IV fluids.  Check BMP in a.m.  diarrhea with mild metabolic acidosis.  Continue on IV fluid hydration.  Was on Colace and MiraLAX at home.  Currently on hold.  Acute kidney injury likely secondary to volume depletion.  Continue IV fluids.  Slightly improved creatinine levels today.  Hold triamterene HCTZ.  Ultrasound of the kidney did not show any evidence of hydronephrosis and normal echogenicity.  Essential hypertension we will closely monitor.  Put on PRN hydralazine for now.  Hold triamterene hydrochlorothiazide for now.  History of Paget's disease, prostate cancer.  We will continue supportive care.  VTE Prophylaxis: Lovenox  Code Status: Full code  Family Communication: None today  Disposition Plan: Likely in 1 to 2 days.  Will get PT evaluation.  Follow fever trend, blood culture urine culture.   Consultants:  None  Procedures:  None  Antibiotics:  Rocephin 09/14/2018  Anti-infectives (From admission, onward)   Start     Dose/Rate Route Frequency Ordered Stop   09/15/18 0115  cefTRIAXone (ROCEPHIN) 1 g in sodium chloride 0.9 % 100 mL IVPB     1 g 200 mL/hr over 30 Minutes Intravenous Daily at bedtime 09/15/18 0054     09/14/18 2000  ciprofloxacin (CIPRO) IVPB 400 mg  400 mg 200 mL/hr over 60 Minutes Intravenous  Once 09/14/18 1959 09/14/18 2206     Subjective: Today patient complains of generalized body pain.  T-max noted at 102.7 F.  No nausea vomiting or abdominal pain.  Objective: Vitals:   09/15/18 0300 09/15/18 0553  BP: (!) 154/75 122/70  Pulse: 90 77  Resp: 16 20  Temp: (!) 102 F (38.9 C) 99.3 F (37.4 C)  SpO2: 97% 96%    Intake/Output Summary (Last 24 hours) at 09/15/2018 1131 Last data filed at 09/15/2018 0830 Gross per 24 hour  Intake 1562.02 ml  Output -   Net 1562.02 ml   Filed Weights   09/14/18 1706  Weight: 74.8 kg   Body mass index is 23.68 kg/m.   Physical Exam: GENERAL: Patient is alert awake and oriented. Not in obvious distress. HENT: No scleral pallor or icterus. Pupils equally reactive to light. Oral mucosa is slightly dry. NECK: is supple, no palpable thyroid enlargement. CHEST: Clear to auscultation. No crackles or wheezes. Non tender on palpation. Diminished breath sounds bilaterally. CVS: S1 and S2 heard, no murmur. Regular rate and rhythm. No pericardial rub. ABDOMEN: Soft, non-tender, bowel sounds are present. No palpable hepato-splenomegaly. EXTREMITIES: No edema. CNS: Cranial nerves are intact. No focal motor or sensory deficits. SKIN: warm and dry without rashes.  Data Review: I have personally reviewed the following laboratory data and studies,  CBC: Recent Labs  Lab 09/14/18 1838 09/15/18 0621  WBC 10.1 9.9  NEUTROABS 8.5*  --   HGB 12.6* 11.1*  HCT 37.6* 34.2*  MCV 88.5 91.0  PLT 172 123456*   Basic Metabolic Panel: Recent Labs  Lab 09/14/18 1838 09/15/18 0621  NA 129* 130*  K 3.2* 3.3*  CL 95* 102  CO2 18* 16*  GLUCOSE 129* 121*  BUN 18 20  CREATININE 1.93* 1.64*  CALCIUM 8.7* 8.0*   Liver Function Tests: Recent Labs  Lab 09/14/18 1838 09/15/18 0621  AST 99* 109*  ALT 49* 54*  ALKPHOS 84 76  BILITOT 1.1 1.0  PROT 8.6* 7.1  ALBUMIN 3.7 3.2*   Recent Labs  Lab 09/14/18 1838  LIPASE 36   No results for input(s): AMMONIA in the last 168 hours. Cardiac Enzymes: Recent Labs  Lab 09/14/18 1838  CKTOTAL 1,094*   BNP (last 3 results) No results for input(s): BNP in the last 8760 hours.  ProBNP (last 3 results) No results for input(s): PROBNP in the last 8760 hours.  CBG: No results for input(s): GLUCAP in the last 168 hours. No results found for this or any previous visit (from the past 240 hour(s)).   Studies: US Renal  Result Date: 09/15/2018 CLINICAL DATA:  Acute  renal failure. EXAM: RENAL / URINARY TRACT ULTRASOUND COMPLETE COMPARISON:  None. FINDINGS: Right Kidney: Renal measurements: 10.5 x 5.8 x 5.4 cm = volume: 170 mL . Echogenicity within normal limits. No mass or hydronephrosis visualized. Left Kidney: Renal measurements: 11.6 x 5.2 by 5.8 cm = volume: 182 mL. Echogenicity within normal limits. No mass or hydronephrosis visualized. Bladder: Appears normal for degree of bladder distention. IMPRESSION: Normal size and appearance of both kidneys. No evidence of hydronephrosis. Electronically Signed   By: Marlaine Hind M.D.   On: 09/15/2018 05:51    Scheduled Meds: . atorvastatin  40 mg Oral QPM  . enoxaparin (LOVENOX) injection  40 mg Subcutaneous Q24H    Continuous Infusions: . sodium chloride Stopped (09/14/18 2345)  . sodium chloride 125 mL/hr at 09/15/18 0116  .  cefTRIAXone (ROCEPHIN)  IV 1 g (09/15/18 0126)     Flora Lipps, MD  [facility] 09/15/2018

## 2018-09-15 NOTE — Evaluation (Signed)
Physical Therapy Evaluation Patient Details Name: Marcus [name] MRN: [id number] DOB: [birth year] Today's Date: 09/15/2018   History of Present Illness  73 yo male admitted with UTI, AKI. Hx of Paget's disease, prostate ca, depression  Clinical Impression  On eval, pt was Min guard-Min assist for mobility. He walked ~175 feet in the hallway. LOB x 1 with head turn to L while speaking to NT. Recommend daily ambulation in hallway with nursing supervision as able. Will follow during hospital stay.     Follow Up Recommendations Supervision for mobility/OOB    Equipment Recommendations  None recommended by PT    Recommendations for Other Services       Precautions / Restrictions Restrictions Weight Bearing Restrictions: No      Mobility  Bed Mobility Overal bed mobility: Modified Independent                Transfers Overall transfer level: Needs assistance   Transfers: Sit to/from Stand Sit to Stand: Supervision         General transfer comment: for safety  Ambulation/Gait Ambulation/Gait assistance: Min assist;Min guard Gait Distance (Feet): 175 Feet Assistive device: None Gait Pattern/deviations: Step-through pattern;Decreased stride length     General Gait Details: slow gati speed. Unsteady. LOB x 1 with head turn to L. Pt denied lightheadedness.  Stairs            Wheelchair Mobility    Modified Rankin (Stroke Patients Only)       Balance Overall balance assessment: Needs assistance           Standing balance-Leahy Scale: Fair                               Pertinent Vitals/Pain Pain Assessment: No/denies pain    Home Living Family/patient expects to be discharged to:: Private residence Living Arrangements: Non-relatives/Friends   Type of Home: Apartment     Entrance Stairs-Number of Steps: 1 Home Layout: Multi-level Home Equipment: Cane - single point;Walker - 2 wheels      Prior Function Level of Independence:  Independent               Hand Dominance        Extremity/Trunk Assessment   Upper Extremity Assessment Upper Extremity Assessment: Overall WFL for tasks assessed    Lower Extremity Assessment Lower Extremity Assessment: Generalized weakness    Cervical / Trunk Assessment Cervical / Trunk Assessment: Normal  Communication   Communication: No difficulties  Cognition Arousal/Alertness: Awake/alert Behavior During Therapy: WFL for tasks assessed/performed Overall Cognitive Status: Within Functional Limits for tasks assessed                                        General Comments      Exercises     Assessment/Plan    PT Assessment Patient needs continued PT services  PT Problem List Decreased strength;Decreased mobility;Decreased balance       PT Treatment Interventions DME instruction;Gait training;Therapeutic exercise;Therapeutic activities;Patient/family education;Functional mobility training;Balance training    PT Goals (Current goals can be found in the Care Plan section)  Acute Rehab PT Goals Patient Stated Goal: home soon PT Goal Formulation: With patient Time For Goal Achievement: 09/29/18 Potential to Achieve Goals: Good    Frequency Min 3X/week   Barriers to discharge  Co-evaluation               AM-PAC PT "6 Clicks" Mobility  Outcome Measure Help needed turning from your back to your side while in a flat bed without using bedrails?: A Little Help needed moving from lying on your back to sitting on the side of a flat bed without using bedrails?: A Little Help needed moving to and from a bed to a chair (including a wheelchair)?: A Little Help needed standing up from a chair using your arms (e.g., wheelchair or bedside chair)?: A Little Help needed to walk in hospital room?: A Little Help needed climbing 3-5 steps with a railing? : A Little 6 Click Score: 18    End of Session Equipment Utilized During Treatment:  Gait belt Activity Tolerance: Patient tolerated treatment well Patient left: in chair;with call bell/phone within reach;with nursing/sitter in room   PT Visit Diagnosis: Unsteadiness on feet (R26.81);Muscle weakness (generalized) (M62.81)    Time: [id number] PT Time Calculation (min) (ACUTE ONLY): 12 min   Charges:   PT Evaluation $PT Eval Moderate Complexity: [place], PT Acute Rehabilitation Services Pager: [phone] Office: [phone]

## 2018-09-15 NOTE — Progress Notes (Signed)
No new orders concerning fever, SOB and cough. Tylenol ordered and given at 0240. Ineffective. Temp 102.0 at 0300. Page admitting doctor. No new orders. Will continue to monitor.  Yellow MEWS initiated.  MEWS/VS Documentation      09/14/2018 2300 09/14/2018 2330 09/15/2018 0101 09/15/2018 0300   MEWS Score:  0  0  1  2   MEWS Score Color:  Green  Green  Green  Yellow   Resp:  -  -  16  16   Pulse:  94  88  90  90   BP:  (!) 145/64  (!) 166/68  (!) 154/75  (!) 154/75   Temp:  -  -  (!) 101.3 F (38.5 C)  (!) 102 F (38.9 C)   O2 Device:  -  -  Room [employer]

## 2018-09-16 LAB — MAGNESIUM: Magnesium: 2.2 mg/dL (ref 1.7–2.4)

## 2018-09-16 LAB — CBC
HCT: 31 % — ABNORMAL LOW (ref 39.0–52.0)
Hemoglobin: 10.1 g/dL — ABNORMAL LOW (ref 13.0–17.0)
MCH: 29.4 pg (ref 26.0–34.0)
MCHC: 32.6 g/dL (ref 30.0–36.0)
MCV: 90.1 fL (ref 80.0–100.0)
Platelets: 149 10*3/uL — ABNORMAL LOW (ref 150–400)
RBC: 3.44 MIL/uL — ABNORMAL LOW (ref 4.22–5.81)
RDW: 16.4 % — ABNORMAL HIGH (ref 11.5–15.5)
WBC: 6.3 10*3/uL (ref 4.0–10.5)
nRBC: 0 % (ref 0.0–0.2)

## 2018-09-16 LAB — PHOSPHORUS: Phosphorus: 1.4 mg/dL — ABNORMAL LOW (ref 2.5–4.6)

## 2018-09-16 LAB — URINE CULTURE: Culture: >=100000 — AB

## 2018-09-16 LAB — BASIC METABOLIC PANEL
Anion gap: 7 (ref 5–15)
BUN: 12 mg/dL (ref 8–23)
CO2: 21 mmol/L — ABNORMAL LOW (ref 22–32)
Calcium: 7.8 mg/dL — ABNORMAL LOW (ref 8.9–10.3)
Chloride: 103 mmol/L (ref 98–111)
Creatinine, Ser: 1.09 mg/dL (ref 0.61–1.24)
GFR calc Af Amer: >60 mL/min (ref >60)
GFR calc non Af Amer: >60 mL/min (ref >60)
Glucose, Bld: 103 mg/dL — ABNORMAL HIGH (ref 70–99)
Potassium: 3.3 mmol/L — ABNORMAL LOW (ref 3.5–5.1)
Sodium: 131 mmol/L — ABNORMAL LOW (ref 135–145)

## 2018-09-16 NOTE — Progress Notes (Addendum)
PROGRESS NOTE  Marcus [name] [id number] DOB: 10/15/1945 DOA: 09/14/2018 PCP: Rogers Blocker, MD   LOS: 2 days   Brief narrative: Marcus [name] is a 73 y.o. 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  Also generalized weakness as well as strongly discolored urine.  Patient has been weak and not able to do his normal chores.  He has some abdominal cramps but no significant pain.  He also felt like he was febrile but has not recorded it at home.   No exposure to any person with COVID-19.  Patient came to the ER where he was seen and evaluated.  He was found to have evidence of UTI, significant dehydration and electrolyte abnormalities.  Patient was therefore being admitted to the hospital for treatment..  In the ED, Temperature was 103 F,  blood pressure 166/68 pulse 90 respirate 25 oxygen sat 64% on room air initially currently 100% on room air.  White count is 10.1 hemoglobin 12.6 and platelet 172.  Sodium 129 potassium 3.2 chloride 95 CO2 of 18 with creatinine 1.98 and BUN 18 calcium 8.7.  CK is 1094, COVID-19 screen- pending  Urinalysis showed cloudy urine with moderate hemoglobin and large leukocytes.  RBC 11-20 WBC clumps are more than 50.  Urine culture and blood cultures were obtained.    Assessment/Plan:  Principal Problem:   Acute lower UTI Active Problems:   Prostate cancer ([place])   Hyponatremia   Hypokalemia   ARF (acute renal failure) (HCC)   fever/Enterobacter urinary tract infection.    Patient presented with fever and abnormal urinalysis.  on Rocephin for now- is sensitive.  Blood cultures are negative so far.   WBC 6.3 today.  T-max of 101.2 F yesterday at 1 PM.  Mild hyponatremia likely secondary to volume depletion.  Improving on normal saline.  We will continue with  Volume depletion on presentation.   Likely secondary to diarrhea.  Continue with IV fluid hydration.  Improving  Hypokalemia.  Will replenish with IV fluids.  Potassium of 3.3 today.  We will continue to replenish  diarrhea with mild metabolic acidosis.  Continue on IV fluid hydration.  Was on Colace and MiraLAX at home.  Currently on hold.  Improving acidosis  Acute kidney injury likely secondary to volume depletion.  Improving.  Hold triamterene HCTZ.  Ultrasound of the kidney did not show any evidence of hydronephrosis and normal echogenicity.  Essential hypertension we will closely monitor.  PRN hydralazine for now.  Hold triamterene hydrochlorothiazide for now.  History of Paget's disease, prostate cancer.  We will continue supportive care.  VTE Prophylaxis: Lovenox  Code Status: Full code  Family Communication: I tried to reach the patient's daughter on the phone but the phone was nonfunctional.  Disposition Plan: Likely home by tomorrow if no spiking fever.  PT has evaluated the patient and did not recommend any skilled therapy needs.  Follow fever trend  Consultants:  None  Procedures:  None  Antibiotics:  Rocephin> 09/14/2018  Anti-infectives (From admission, onward)   Start     Dose/Rate Route Frequency Ordered Stop   09/15/18 0115  cefTRIAXone (ROCEPHIN) 1 g in sodium chloride 0.9 % 100 mL IVPB     1 g 200 mL/hr over 30 Minutes Intravenous Daily at bedtime 09/15/18 0054     09/14/18 2000  ciprofloxacin (CIPRO)  IVPB 400 mg     400 mg 200 mL/hr over 60 Minutes Intravenous  Once 09/14/18 1959 09/14/18 2206     Subjective: Today, patient complains of generalized body pain and hip pain.  No nausea, vomiting or abdominal pain.  No shortness of breath, cough.  Still complains of being fatigued.  Objective: Vitals:   09/15/18 2022 09/16/18 0519  BP: (!) 144/74 128/70  Pulse: 80 83  Resp: 17 17  Temp: 99.6 F (37.6 C) 99 F (37.2 C)  SpO2: 96% 93%    Intake/Output Summary (Last 24 hours) at  09/16/2018 1257 Last data filed at 09/16/2018 0909 Gross per 24 hour  Intake 1442.92 ml  Output 526 ml  Net 916.92 ml   Filed Weights   09/14/18 1706  Weight: 74.8 kg   Body mass index is 23.68 kg/m.   Physical Exam: General:  Average built, not in obvious distress HENT: Normocephalic, pupils equally reacting to light and accommodation.  No scleral pallor or icterus noted. Oral mucosa is dry. Chest:  Clear breath sounds.  Diminished breath sounds bilaterally. No crackles or wheezes.  CVS: S1 &S2 heard. No murmur.  Regular rate and rhythm. Abdomen: Soft, nontender, nondistended.  Bowel sounds are heard.  Liver is not palpable, no abdominal mass palpated Extremities: No cyanosis, clubbing or edema.  Peripheral pulses are palpable. Psych: Alert, awake and oriented, normal mood CNS:  No cranial nerve deficits.  Power equal in all extremities.  No sensory deficits noted.  No cerebellar signs.   Skin: Warm and dry.  No rashes noted.   Data Review: I have personally reviewed the following laboratory data and studies,  CBC: Recent Labs  Lab 09/14/18 1838 09/15/18 0621 09/16/18 0630  WBC 10.1 9.9 6.3  NEUTROABS 8.5*  --   --   HGB 12.6* 11.1* 10.1*  HCT 37.6* 34.2* 31.0*  MCV 88.5 91.0 90.1  PLT 172 148* 123456*   Basic Metabolic Panel: Recent Labs  Lab 09/14/18 1838 09/15/18 0621 09/16/18 0630  NA 129* 130* 131*  K 3.2* 3.3* 3.3*  CL 95* 102 103  CO2 18* 16* 21*  GLUCOSE 129* 121* 103*  BUN 18 20 12   CREATININE 1.93* 1.64* 1.09  CALCIUM 8.7* 8.0* 7.8*  MG  --   --  2.2  PHOS  --   --  1.4*   Liver Function Tests: Recent Labs  Lab 09/14/18 1838 09/15/18 0621  AST 99* 109*  ALT 49* 54*  ALKPHOS 84 76  BILITOT 1.1 1.0  PROT 8.6* 7.1  ALBUMIN 3.7 3.2*   Recent Labs  Lab 09/14/18 1838  LIPASE 36   No results for input(s): AMMONIA in the last 168 hours. Cardiac Enzymes: Recent Labs  Lab 09/14/18 1838  CKTOTAL 1,094*   BNP (last 3 results) No results for  input(s): BNP in the last 8760 hours.  ProBNP (last 3 results) No results for input(s): PROBNP in the last 8760 hours.  CBG: No results for input(s): GLUCAP in the last 168 hours. Recent Results (from the past 240 hour(s))  SARS CORONAVIRUS 2 (TAT 6-24 HRS) Nasal Swab Aptima Multi Swab     Status: None   Collection Time: 09/14/18 10:05 PM   Specimen: Aptima Multi Swab; Nasal Swab  Result Value Ref Range Status   SARS Coronavirus 2 NEGATIVE NEGATIVE Final    Comment: (NOTE) SARS-CoV-2 target nucleic acids are NOT DETECTED. The SARS-CoV-2 RNA is generally detectable in upper and lower respiratory specimens during the acute phase  of infection. Negative results do not preclude SARS-CoV-2 infection, do not rule out co-infections with other pathogens, and should not be used as the sole basis for treatment or other patient management decisions. Negative results must be combined with clinical observations, patient history, and epidemiological information. The expected result is Negative. Fact Sheet for Patients: SugarRoll.be Fact Sheet for Healthcare Providers: https://www.woods-mathews.com/ This test is not yet approved or cleared by the [place] FDA and  has been authorized for detection and/or diagnosis of SARS-CoV-2 by FDA under an Emergency Use Authorization (EUA). This EUA will remain  in effect (meaning this test can be used) for the duration of the COVID-19 declaration under Section 56 4(b)(1) of the Act, 21 U.S.C. section 360bbb-3(b)(1), unless the authorization is terminated or revoked sooner. Performed at [facility], [place] [address], [place], [place] [address]   Urine culture     Status: Abnormal   Collection Time: 09/15/18 12:55 AM   Specimen: Urine, Random  Result Value Ref Range Status   Specimen Description   Final    URINE, RANDOM Performed at [facility] [address], [place], [place]  [address]    Special Requests   Final    NONE Performed at [facility], [place] [address] [place], [place] [address]    Culture >=100,000 COLONIES/mL ENTEROBACTER AEROGENES (A)  Final   Report Status 09/16/2018 FINAL  Final   Organism ID, Bacteria ENTEROBACTER AEROGENES (A)  Final      Susceptibility   Enterobacter aerogenes - MIC*    CEFAZOLIN >=64 RESISTANT Resistant     CEFTRIAXONE <=1 SENSITIVE Sensitive     CIPROFLOXACIN <=0.25 SENSITIVE Sensitive     GENTAMICIN <=1 SENSITIVE Sensitive     IMIPENEM 2 SENSITIVE Sensitive     NITROFURANTOIN 128 RESISTANT Resistant     TRIMETH/SULFA <=20 SENSITIVE Sensitive     PIP/TAZO <=4 SENSITIVE Sensitive     * >=100,000 COLONIES/mL ENTEROBACTER AEROGENES  Culture, blood (routine x 2)     Status: None (Preliminary result)   Collection Time: 09/15/18  1:46 PM   Specimen: BLOOD RIGHT HAND  Result Value Ref Range Status   Specimen Description   Final    BLOOD RIGHT HAND Performed at [facility] [address] [place], [place] [address]    Special Requests   Final    BOTTLES DRAWN AEROBIC ONLY Blood Culture results may not be optimal due to an inadequate volume of blood received in culture bottles Performed at [facility] [address] [place], [place] [address]    Culture   Final    NO GROWTH < 24 HOURS Performed at [facility] [address], [place], [place] [address]    Report Status PENDING  Incomplete  Culture, blood (routine x 2)     Status: None (Preliminary result)   Collection Time: 09/15/18  1:54 PM   Specimen: BLOOD  Result Value Ref Range Status   Specimen Description   Final    BLOOD LEFT ARM Performed at [facility] [address], [place], [place] [address]    Special Requests   Final    BOTTLES DRAWN AEROBIC AND ANAEROBIC Blood Culture adequate volume Performed at [facility] [address], [place],  [place] [address]    Culture   Final    NO GROWTH < 24 HOURS Performed at [facility] [address], [place], [place] [address]  Report Status PENDING  Incomplete     Studies: US Renal  Result Date: 09/15/2018 CLINICAL DATA:  Acute renal failure. EXAM: RENAL / URINARY TRACT ULTRASOUND COMPLETE COMPARISON:  None. FINDINGS: Right Kidney: Renal measurements: 10.5 x 5.8 x 5.4 cm = volume: 170 mL . Echogenicity within normal limits. No mass or hydronephrosis visualized. Left Kidney: Renal measurements: 11.6 x 5.2 by 5.8 cm = volume: 182 mL. Echogenicity within normal limits. No mass or hydronephrosis visualized. Bladder: Appears normal for degree of bladder distention. IMPRESSION: Normal size and appearance of both kidneys. No evidence of hydronephrosis. Electronically Signed   By: Marlaine Hind M.D.   On: 09/15/2018 05:51    Scheduled Meds: . atorvastatin  40 mg Oral QPM  . enoxaparin (LOVENOX) injection  40 mg Subcutaneous Q24H    Continuous Infusions: . sodium chloride 100 mL/hr at 09/16/18 0905  . sodium chloride 125 mL/hr at 09/15/18 0116  . cefTRIAXone (ROCEPHIN)  IV 1 g (09/15/18 2145)     Flora Lipps, MD  [facility] 09/16/2018

## 2018-09-16 NOTE — TOC Initial Note (Signed)
Transition of Care [facility]) - Initial/Assessment Note    Patient Details  Name: Marcus [name] MRN: [id number] Date of Birth: [birth year]  Transition of Care [facility]) CM/SW Contact:    Nila Nephew, LCSW Phone Number: [phone] 09/16/2018, 1:32 PM  Clinical Narrative:          Pt admitted with UTI from home where he resides with 2 roommates. CSW was consulted for homelessness however pt reports he is not homeless. CSW inquired as to environment at home for recovery  (PT recommending supervision) and pt reports roommates are not very involved in each other's affairs but they are at home with him sometimes and he feels safe there. Reports he has a walker and would like a rollater and possibly shower chair to assist with safe mobilization at home. CSW will look into these requests. Pt states his daughter lives in [place] and is planning to talk with her today so she is aware of situation and see if she or other family would prefer for him to come stay with them to recover after hospital stay. CSW will follow up about plans in order to arrange potential DME needs.         Expected Discharge Plan: Home/Self Care Barriers to Discharge: Continued Medical Work up   Patient Goals and CMS Choice Patient states their goals for this hospitalization and ongoing recovery are:: "be able to get to the bathroom and in the shower"   Choice offered to / list presented to : Patient  Expected Discharge Plan and Services Expected Discharge Plan: Home/Self Care In-house Referral: Clinical Social Work   Post Acute Care Choice: Durable Medical Equipment Living arrangements for the past 2 months: Apartment                                      Prior Living Arrangements/Services Living arrangements for the past 2 months: Apartment Lives with:: Roommate Patient language and need for interpreter reviewed:: No Do you feel safe going back to the place where you live?: Yes      Need for Family  Participation in Patient Care: Yes (Comment)(pt updating daughter) Care giver support system in place?: No (comment) Current home services: DME Criminal Activity/Legal Involvement Pertinent to Current Situation/Hospitalization: No - Comment as needed  Activities of Daily Living Home Assistive Devices/Equipment: Environmental consultant (specify type), Eyeglasses ADL Screening (condition at time of admission) Patient's cognitive ability adequate to safely complete daily activities?: Yes Is the patient deaf or have difficulty hearing?: No Does the patient have difficulty seeing, even when wearing glasses/contacts?: Yes Does the patient have difficulty concentrating, remembering, or making decisions?: No Patient able to express need for assistance with ADLs?: Yes Does the patient have difficulty dressing or bathing?: Yes Independently performs ADLs?: Yes (appropriate for developmental age) Does the patient have difficulty walking or climbing stairs?: Yes Weakness of Legs: Both Weakness of Arms/Hands: None  Permission Sought/Granted                  Emotional Assessment Appearance:: Appears stated age Attitude/Demeanor/Rapport: Engaged Affect (typically observed): Calm, Accepting, Pleasant Orientation: : Oriented to Self, Oriented to Place, Oriented to  Time Alcohol / Substance Use: Not Applicable Psych Involvement: No (comment)  Admission diagnosis:  Lower urinary tract infectious disease [N39.0] AKI (acute kidney injury) ([place]) [N17.9] Non-traumatic rhabdomyolysis [M62.82] Patient Active Problem List   Diagnosis Date Noted  . Hyponatremia 09/14/2018  .  Hypokalemia 09/14/2018  . ARF (acute renal failure) ([place]) 09/14/2018  . Acute lower UTI 09/14/2018  . 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 Prostate cancer ([place]) 09/04/2013   PCP:  Rogers Blocker, MD Pharmacy:   [facility] [place], [place] - [address]  [place] [address] Phone: [phone] Fax: [phone]  [id number] - [address] [place] [address] Phone: [phone] Fax: [phone]     Social Determinants of Health (SDOH) Interventions    Readmission Risk Interventions No flowsheet data found.

## 2018-09-17 LAB — CBC
HCT: 30.2 % — ABNORMAL LOW (ref 39.0–52.0)
Hemoglobin: 9.9 g/dL — ABNORMAL LOW (ref 13.0–17.0)
MCH: 29.6 pg (ref 26.0–34.0)
MCHC: 32.8 g/dL (ref 30.0–36.0)
MCV: 90.1 fL (ref 80.0–100.0)
Platelets: 154 10*3/uL (ref 150–400)
RBC: 3.35 MIL/uL — ABNORMAL LOW (ref 4.22–5.81)
RDW: 16.5 % — ABNORMAL HIGH (ref 11.5–15.5)
WBC: 3.9 10*3/uL — ABNORMAL LOW (ref 4.0–10.5)
nRBC: 0 % (ref 0.0–0.2)

## 2018-09-17 LAB — MAGNESIUM: Magnesium: 2.2 mg/dL (ref 1.7–2.4)

## 2018-09-17 LAB — BASIC METABOLIC PANEL
Anion gap: 11 (ref 5–15)
BUN: 11 mg/dL (ref 8–23)
CO2: 21 mmol/L — ABNORMAL LOW (ref 22–32)
Calcium: 8.1 mg/dL — ABNORMAL LOW (ref 8.9–10.3)
Chloride: 105 mmol/L (ref 98–111)
Creatinine, Ser: 0.94 mg/dL (ref 0.61–1.24)
GFR calc Af Amer: >60 mL/min (ref >60)
GFR calc non Af Amer: >60 mL/min (ref >60)
Glucose, Bld: 94 mg/dL (ref 70–99)
Potassium: 3.3 mmol/L — ABNORMAL LOW (ref 3.5–5.1)
Sodium: 137 mmol/L (ref 135–145)

## 2018-09-17 MED ORDER — AMLODIPINE BESYLATE 10 MG PO TABS: 10.0000 mg | ORAL_TABLET | Freq: Every day | ORAL | 1 refills | Status: DC

## 2018-09-17 MED ORDER — POTASSIUM CHLORIDE CRYS ER 20 MEQ PO TBCR
40.0000 meq | EXTENDED_RELEASE_TABLET | Freq: Once | ORAL | Status: AC
  Administered 2018-09-17: 40 meq via ORAL
  Filled 2018-09-17: qty 2

## 2018-09-17 MED ORDER — HYDROCODONE-ACETAMINOPHEN 7.5-325 MG PO TABS: 1.0000 | ORAL_TABLET | Freq: Four times a day (QID) | ORAL | 0 refills | Status: AC | PRN

## 2018-09-17 MED ORDER — POTASSIUM PHOSPHATE MONOBASIC 500 MG PO TABS: 500.0000 mg | ORAL_TABLET | Freq: Two times a day (BID) | ORAL | 0 refills | Status: AC

## 2018-09-17 MED ORDER — CEFDINIR 300 MG PO CAPS: 300.0000 mg | ORAL_CAPSULE | Freq: Two times a day (BID) | ORAL | 0 refills | Status: AC

## 2018-09-17 MED ORDER — FERROUS SULFATE 325 (65 FE) MG PO TABS: 325.0000 mg | ORAL_TABLET | Freq: Every day | ORAL | 1 refills | Status: DC

## 2018-09-17 MED ORDER — METHOCARBAMOL 500 MG PO TABS: 500.0000 mg | ORAL_TABLET | Freq: Four times a day (QID) | ORAL | 0 refills | Status: AC | PRN

## 2018-09-17 NOTE — Care Management Important Message (Signed)
Important Message  Patient Details IM Letter given to Sharren Bridge SW to present to the Patient Name: Marcus [name] MRN: [id number] Date of Birth: 12-Oct-1945   Medicare Important Message Given:  Yes     Kerin Salen 09/17/2018, 10:39 AM

## 2018-09-17 NOTE — Plan of Care (Signed)

## 2018-09-17 NOTE — Discharge Summary (Signed)
Physician Discharge Summary  YUVIN DUBEL [id number] DOB: Nov 07, 1945 DOA: 09/14/2018  PCP: Rogers Blocker, MD  Admit date: 09/14/2018 Discharge date: 09/17/2018  Admitted From: Home  Discharge disposition: Home   Recommendations for Outpatient Follow-Up:    Follow up with your primary care provider in one week.    Discharge Diagnosis:   Principal Problem:   Acute lower UTI, sepsis secondary to UTI Active Problems:   Prostate cancer ([place])   Hyponatremia   Hypokalemia   ARF (acute renal failure) ([place])   Discharge Condition: Improved.  Diet recommendation: Low sodium, heart healthy.    Wound care: None.  Code status: Full.   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 Also generalized weakness as well as strongly discolored urine. Patient has been weak and not able to do his normal chores. He has some abdominal cramps but no significant pain. He also felt like he was febrile but has not recorded it at home. No exposure to any person with COVID-19. Patient came to the ER where he was seen and evaluated. He was found to have evidence of UTI, significant dehydration and electrolyte abnormalities. Patient was therefore being admitted to the hospital for treatment..  In the ED,Temperature was 103 F,  blood pressure 166/68 pulse 90 respiratory rate 25 oxygen sat 64% on room air initially currently 100% on room air. White count is 10.1 hemoglobin 12.6 and platelet 172. Sodium 129 potassium 3.2 chloride 95 CO2 of 18 with creatinine 1.98 and BUN 18 calcium 8.7.CK is 1094, COVID-19 screen- pending Urinalysis showed cloudy urine with moderate hemoglobin and large leukocytes. RBC 11-20 WBC clumps are more than 50. Urine culture and blood cultures were  obtained.    Hospital Course:  Following conditions were addressed during hospitalization.  Sepsis secondary to Enterobacter urinary tract infection.  Present on admission.  Patient did have fever, tachypnea, tachycardia with acute kidney injury and with abnormal urinalysis suggestive of UTI with sepsis.     Received Rocephin with improvement in his symptoms of sepsis physiology.  Blood cultures were negative.  Patient will be continued on Omnicef to complete on discharge for UTI.  He was advised to follow-up with his primary care physician after discharge.  Mild hyponatremia likely secondary to volume depletion.   Improved after fluid resuscitation.  Sodium prior to discharge was 137.  Volume depletion on presentation.  Likely secondary to diarrhea.  Resolved with IV fluid hydration.  Hypokalemia.  Patient was replenished but he still had  mild hypokalemia.  Patient will be given potassium phosphate on discharge.    Mild hypophosphatemia.  Prescribed potassium phosphate on discharge.  Will need to follow-up with his primary care physician after discharge  Diarrhea with mild metabolic acidosis.    Received IV fluids.  Was on Colace and MiraLAX at home.    Will discontinue laxatives on discharge.  Acute kidney injury likely secondary to volume depletion.   Improved. triamterene HCTZ was discontinued on discharge.Marland Kitchen  Ultrasound of the kidney did not show any evidence of hydronephrosis and normal echogenicity.  Essential hypertension - triamterene hydrochlorothiazide was discontinued.  Patient was initiated on amlodipine on discharge.  He will have to follow-up with his primary care physician on discharge to monitor his blood pressure and adjust medication as necessary.  History of Paget's disease, prostate cancer.    Patient received supportive care while in the hospital. Follow-up as outpatient.  Disposition.  At this time, patient is stable for disposition home.  He will have to complete  his antibiotic and follow-up with his primary care physician as outpatient to discuss about blood pressure treatment, monitor his blood work and ensure that his urinary infection has resolved.    Medical Consultants:    None.  Subjective:   Today, patient feels better.  Has more energy.  Denies any fever chills.  Has mild bone pain from his history of Paget's disease.  Discharge Exam:   Vitals:   09/16/18 2052 09/17/18 0516  BP: (!) 161/75 140/67  Pulse: 78 68  Resp: 18 19  Temp: 98 F (36.7 C) 98.2 F (36.8 C)  SpO2: 97% 93%   Vitals:   09/16/18 0519 09/16/18 1336 09/16/18 2052 09/17/18 0516  BP: 128/70 134/80 (!) 161/75 140/67  Pulse: 83 71 78 68  Resp: 17 18 18 19   Temp: 99 F (37.2 C) 98.6 F (37 C) 98 F (36.7 C) 98.2 F (36.8 C)  TempSrc: Oral Oral Oral Oral  SpO2: 93% 97% 97% 93%  Weight:      Height:       General exam: Appears calm and comfortable ,Not in distress HEENT:PERRL,Oral mucosa moist Respiratory system: Bilateral equal air entry, normal vesicular breath sounds, no wheezes or crackles  Cardiovascular system: S1 & S2 heard, RRR.  Gastrointestinal system: Abdomen is nondistended, soft and nontender. No organomegaly or masses felt. Normal bowel sounds heard. Central nervous system: Alert and oriented. No focal neurological deficits. Extremities: No edema, no clubbing ,no cyanosis, distal peripheral pulses palpable. Skin: No rashes, lesions or ulcers,no icterus ,no pallor MSK: Normal muscle bulk,tone ,power    Procedures:    None  The results of significant diagnostics from this hospitalization (including imaging, microbiology, ancillary and laboratory) are listed below for reference.     Diagnostic Studies:   US Renal  Result Date: 09/15/2018 CLINICAL DATA:  Acute renal failure. EXAM: RENAL / URINARY TRACT ULTRASOUND COMPLETE COMPARISON:  None. FINDINGS: Right Kidney: Renal measurements: 10.5 x 5.8 x 5.4 cm = volume: 170 mL . Echogenicity  within normal limits. No mass or hydronephrosis visualized. Left Kidney: Renal measurements: 11.6 x 5.2 by 5.8 cm = volume: 182 mL. Echogenicity within normal limits. No mass or hydronephrosis visualized. Bladder: Appears normal for degree of bladder distention. IMPRESSION: Normal size and appearance of both kidneys. No evidence of hydronephrosis. Electronically Signed   By: Marlaine Hind M.D.   On: 09/15/2018 05:51     Labs:   Basic Metabolic Panel: Recent Labs  Lab 09/14/18 1838 09/15/18 0621 09/16/18 0630 09/17/18 0607  NA 129* 130* 131* 137  K 3.2* 3.3* 3.3* 3.3*  CL 95* 102 103 105  CO2 18* 16* 21* 21*  GLUCOSE 129* 121* 103* 94  BUN 18 20 12 11   CREATININE 1.93* 1.64* 1.09 0.94  CALCIUM 8.7* 8.0* 7.8* 8.1*  MG  --   --  2.2 2.2  PHOS  --   --  1.4*  --    GFR Estimated Creatinine Clearance: 75.5 mL/min (by C-G formula based on SCr of 0.94 mg/dL). Liver Function Tests: Recent Labs  Lab 09/14/18 1838 09/15/18 0621  AST 99* 109*  ALT 49* 54*  ALKPHOS 84 76  BILITOT 1.1 1.0  PROT 8.6* 7.1  ALBUMIN 3.7 3.2*   Recent Labs  Lab 09/14/18 1838  LIPASE 36   No results for input(s): AMMONIA in the last 168 hours. Coagulation profile No results for input(s): INR, PROTIME in the last 168 hours.  CBC: Recent Labs  Lab 09/14/18 1838 09/15/18 0621 09/16/18 0630 09/17/18 0607  WBC 10.1 9.9 6.3 3.9*  NEUTROABS 8.5*  --   --   --   HGB 12.6* 11.1* 10.1* 9.9*  HCT 37.6* 34.2* 31.0* 30.2*  MCV 88.5 91.0 90.1 90.1  PLT 172 148* 149* 154   Cardiac Enzymes: Recent Labs  Lab 09/14/18 1838  CKTOTAL 1,094*   BNP: Invalid input(s): POCBNP CBG: No results for input(s): GLUCAP in the last 168 hours. D-Dimer No results for input(s): DDIMER in the last 72 hours. Hgb A1c No results for input(s): HGBA1C in the last 72 hours. Lipid Profile No results for input(s): CHOL, HDL, LDLCALC, TRIG, CHOLHDL, LDLDIRECT in the last 72 hours. Thyroid function studies No results for  input(s): TSH, T4TOTAL, T3FREE, THYROIDAB in the last 72 hours.  Invalid input(s): FREET3 Anemia work up No results for input(s): VITAMINB12, FOLATE, FERRITIN, TIBC, IRON, RETICCTPCT in the last 72 hours. Microbiology Recent Results (from the past 240 hour(s))  SARS CORONAVIRUS 2 (TAT 6-24 HRS) Nasal Swab Aptima Multi Swab     Status: None   Collection Time: 09/14/18 10:05 PM   Specimen: Aptima Multi Swab; Nasal Swab  Result Value Ref Range Status   SARS Coronavirus 2 NEGATIVE NEGATIVE Final    Comment: (NOTE) SARS-CoV-2 target nucleic acids are NOT DETECTED. The SARS-CoV-2 RNA is generally detectable in upper and lower respiratory specimens during the acute phase of infection. Negative results do not preclude SARS-CoV-2 infection, do not rule out co-infections with other pathogens, and should not be used as the sole basis for treatment or other patient management decisions. Negative results must be combined with clinical observations, patient history, and epidemiological information. The expected result is Negative. Fact Sheet for Patients: SugarRoll.be Fact Sheet for Healthcare Providers: https://www.woods-mathews.com/ This test is not yet approved or cleared by the [place] FDA and  has been authorized for detection and/or diagnosis of SARS-CoV-2 by FDA under an Emergency Use Authorization (EUA). This EUA will remain  in effect (meaning this test can be used) for the duration of the COVID-19 declaration under Section 56 4(b)(1) of the Act, 21 U.S.C. section 360bbb-3(b)(1), unless the authorization is terminated or revoked sooner. Performed at [facility], [place] [address] [place], [place] [address]   Urine culture     Status: Abnormal   Collection Time: 09/15/18 12:55 AM   Specimen: Urine, Random  Result Value Ref Range Status   Specimen Description   Final    URINE, RANDOM Performed at [facility] [address] [place], [place] [address]    Special Requests   Final    NONE Performed at [facility], [place] [address] [place],  [address]    Culture >=100,000 COLONIES/mL ENTEROBACTER AEROGENES (A)  Final   Report Status 09/16/2018 FINAL  Final   Organism ID, Bacteria ENTEROBACTER AEROGENES (A)  Final      Susceptibility   Enterobacter aerogenes - MIC*    CEFAZOLIN >=64 RESISTANT Resistant     CEFTRIAXONE <=1 SENSITIVE Sensitive     CIPROFLOXACIN <=0.25 SENSITIVE Sensitive     GENTAMICIN <=1 SENSITIVE Sensitive     IMIPENEM 2 SENSITIVE Sensitive     NITROFURANTOIN 128 RESISTANT Resistant     TRIMETH/SULFA <=20 SENSITIVE Sensitive  PIP/TAZO <=4 SENSITIVE Sensitive     * >=100,000 COLONIES/mL ENTEROBACTER AEROGENES  Culture, blood (routine x 2)     Status: None (Preliminary result)   Collection Time: 09/15/18  1:46 PM   Specimen: BLOOD RIGHT HAND  Result Value Ref Range Status   Specimen Description   Final    BLOOD RIGHT HAND Performed at [facility] [address] [place], [place] [address]    Special Requests   Final    BOTTLES DRAWN AEROBIC ONLY Blood Culture results may not be optimal due to an inadequate volume of blood received in culture bottles Performed at [facility] [address] [place], [place] [address]    Culture   Final    NO GROWTH < 24 HOURS Performed at [facility] [address] [place], [place] [address]    Report Status PENDING  Incomplete  Culture, blood (routine x 2)     Status: None (Preliminary result)   Collection Time: 09/15/18  1:54 PM   Specimen: BLOOD  Result Value Ref Range Status   Specimen Description   Final    BLOOD LEFT ARM Performed at [facility] [address] [place], [place] [address]    Special Requests   Final    BOTTLES DRAWN AEROBIC AND ANAEROBIC Blood Culture adequate volume Performed at [facility] [address], [place], [place] [address]    Culture   Final    NO GROWTH < 24 HOURS Performed at [facility] [address] [place], [place] [address]    Report Status PENDING  Incomplete     Discharge Instructions:   Discharge Instructions    Call MD for:  persistant nausea and vomiting   Complete by: As directed    Call MD for:  severe uncontrolled pain   Complete by: As directed    Diet - low sodium heart healthy   Complete by: As directed    Discharge instructions   Complete by: As directed    Please follow-up with your primary care physician within 1 week.  Complete the course of your antibiotic.  Check blood work in the next visit with your family doctor.  Your blood pressure medication has been changed to amlodipine.  Increase your fluid intake at home.   Increase activity slowly   Complete by: As directed      Allergies as of 09/17/2018   No Known Allergies     Medication List    STOP taking these medications   docusate sodium 100 MG capsule Commonly known as: Colace   polyethylene glycol 17 g packet Commonly known as: MIRALAX / GLYCOLAX   triamterene-hydrochlorothiazide 37.5-25 MG tablet Commonly known as: MAXZIDE-25     TAKE these medications   amLODipine 10 MG tablet Commonly known as: NORVASC Take 1 tablet (10 mg total) by mouth daily.   atorvastatin 40 MG tablet Commonly known as: LIPITOR Take 40 mg by mouth every evening.   cefdinir 300 MG capsule Commonly known as: OMNICEF Take 1 capsule (300 mg total) by mouth 2 (two) times daily for 5 days.   ferrous sulfate 325 (65 FE) MG tablet Commonly known as: FerrouSul Take 1 tablet (325 mg total) by mouth daily. What changed: when to take this   HYDROcodone-acetaminophen 7.5-325 MG tablet Commonly known as: Norco Take 1 tablet by mouth every 6 (six) hours as needed for up to 5 days for moderate pain or severe  pain. What changed:   how much to take  when to take this    methocarbamol 500 MG tablet Commonly known as: Robaxin Take 1 tablet (500 mg total) by mouth every 6 (six) hours as needed for up to 5 days for muscle spasms.   potassium phosphate (monobasic) 500 MG tablet Commonly known as: K-PHOS ORIGINAL Take 1 tablet (500 mg total) by mouth 2 (two) times daily with a meal for 5 days.            Durable Medical Equipment  (From admission, onward)         Start     Ordered   09/16/18 1407  For home use only DME Walker rolling  Once    Question:  Patient needs a walker to treat with the following condition  Answer:  Ambulatory dysfunction   09/16/18 1406            Time coordinating discharge: 39 minutes  Signed:  Mahli Glahn  [facility] 09/17/2018, 11:50 AM

## 2018-09-17 NOTE — Progress Notes (Signed)
Marcus [name] to be D/C'd Home per MD order.  Discussed prescriptions and follow up appointments with the patient. Prescriptions given to patient, medication list explained in detail. Pt verbalized understanding.  Allergies as of 09/17/2018   No Known Allergies     Medication List    STOP taking these medications   docusate sodium 100 MG capsule Commonly known as: Colace   polyethylene glycol 17 g packet Commonly known as: MIRALAX / GLYCOLAX   triamterene-hydrochlorothiazide 37.5-25 MG tablet Commonly known as: MAXZIDE-25     TAKE these medications   amLODipine 10 MG tablet Commonly known as: NORVASC Take 1 tablet (10 mg total) by mouth daily.   atorvastatin 40 MG tablet Commonly known as: LIPITOR Take 40 mg by mouth every evening.   cefdinir 300 MG capsule Commonly known as: OMNICEF Take 1 capsule (300 mg total) by mouth 2 (two) times daily for 5 days.   ferrous sulfate 325 (65 FE) MG tablet Commonly known as: FerrouSul Take 1 tablet (325 mg total) by mouth daily. What changed: when to take this   HYDROcodone-acetaminophen 7.5-325 MG tablet Commonly known as: Norco Take 1 tablet by mouth every 6 (six) hours as needed for up to 5 days for moderate pain or severe pain. What changed:   how much to take  when to take this   methocarbamol 500 MG tablet Commonly known as: Robaxin Take 1 tablet (500 mg total) by mouth every 6 (six) hours as needed for up to 5 days for muscle spasms.   potassium phosphate (monobasic) 500 MG tablet Commonly known as: K-PHOS ORIGINAL Take 1 tablet (500 mg total) by mouth 2 (two) times daily with a meal for 5 days.            Durable Medical Equipment  (From admission, onward)         Start     Ordered   09/16/18 1407  For home use only DME Walker rolling  Once    Question:  Patient needs a walker to treat with the following condition  Answer:  Ambulatory dysfunction   09/16/18 1406          Vitals:   09/16/18 2052  09/17/18 0516  BP: (!) 161/75 140/67  Pulse: 78 68  Resp: 18 19  Temp: 98 F (36.7 C) 98.2 F (36.8 C)  SpO2: 97% 93%    Skin clean, dry and intact without evidence of skin break down, no evidence of skin tears noted. IV catheter discontinued intact. Site without signs and symptoms of complications. Dressing and pressure applied. Pt denies pain at this time. No complaints noted.  An After Visit Summary was printed and given to the patient. Patient escorted via [place], and D/C home via private auto.  Nonie Hoyer S 09/17/2018 12:30 PM

## 2018-09-20 LAB — CULTURE, BLOOD (ROUTINE X 2)
Culture: NO GROWTH
Culture: NO GROWTH
Special Requests: ADEQUATE

## 2019-04-14 ENCOUNTER — Ambulatory Visit (INDEPENDENT_AMBULATORY_CARE_PROVIDER_SITE_OTHER): Payer: Medicare HMO | Admitting: Primary Care

## 2020-08-05 ENCOUNTER — Emergency Department (HOSPITAL_COMMUNITY)
Admission: EM | Admit: 2020-08-05 | Discharge: 2020-08-05 | Disposition: A | Discharge status: Home / Self Care | Payer: Medicare Other | Attending: Emergency Medicine | Admitting: Emergency Medicine

## 2020-08-05 ENCOUNTER — Encounter (HOSPITAL_COMMUNITY): Payer: Self-pay | Admitting: Emergency Medicine

## 2020-08-05 ENCOUNTER — Emergency Department (HOSPITAL_COMMUNITY): Payer: Medicare Other

## 2020-08-05 DIAGNOSIS — N3001 Acute cystitis with hematuria: Secondary | ICD-10-CM

## 2020-08-05 DIAGNOSIS — F17210 Nicotine dependence, cigarettes, uncomplicated: Secondary | ICD-10-CM | POA: Diagnosis not present

## 2020-08-05 DIAGNOSIS — Z8546 Personal history of malignant neoplasm of prostate: Secondary | ICD-10-CM | POA: Insufficient documentation

## 2020-08-05 DIAGNOSIS — R42 Dizziness and giddiness: Secondary | ICD-10-CM | POA: Diagnosis present

## 2020-08-05 DIAGNOSIS — I10 Essential (primary) hypertension: Secondary | ICD-10-CM | POA: Insufficient documentation

## 2020-08-05 DIAGNOSIS — Z96641 Presence of right artificial hip joint: Secondary | ICD-10-CM | POA: Diagnosis not present

## 2020-08-05 DIAGNOSIS — Z79899 Other long term (current) drug therapy: Secondary | ICD-10-CM | POA: Insufficient documentation

## 2020-08-05 LAB — BASIC METABOLIC PANEL
Anion gap: 8 (ref 5–15)
BUN: 10 mg/dL (ref 8–23)
CO2: 25 mmol/L (ref 22–32)
Calcium: 9.2 mg/dL (ref 8.9–10.3)
Chloride: 102 mmol/L (ref 98–111)
Creatinine, Ser: 1.22 mg/dL (ref 0.61–1.24)
GFR, Estimated: >60 mL/min (ref >60)
Glucose, Bld: 126 mg/dL — ABNORMAL HIGH (ref 70–99)
Potassium: 3.9 mmol/L (ref 3.5–5.1)
Sodium: 135 mmol/L (ref 135–145)

## 2020-08-05 LAB — URINALYSIS, ROUTINE W REFLEX MICROSCOPIC
Bilirubin Urine: NEGATIVE
Glucose, UA: NEGATIVE mg/dL
Ketones, ur: NEGATIVE mg/dL
Nitrite: POSITIVE — AB
Protein, ur: NEGATIVE mg/dL
Specific Gravity, Urine: 1.017 (ref 1.005–1.030)
WBC, UA: >50 WBC/hpf — ABNORMAL HIGH (ref 0–5)
pH: 5 (ref 5.0–8.0)

## 2020-08-05 LAB — TYPE AND SCREEN
ABO/RH(D): A POS
Antibody Screen: NEGATIVE

## 2020-08-05 LAB — CBC
HCT: 37 % — ABNORMAL LOW (ref 39.0–52.0)
Hemoglobin: 11.5 g/dL — ABNORMAL LOW (ref 13.0–17.0)
MCH: 25.8 pg — ABNORMAL LOW (ref 26.0–34.0)
MCHC: 31.1 g/dL (ref 30.0–36.0)
MCV: 83.1 fL (ref 80.0–100.0)
Platelets: 277 10*3/uL (ref 150–400)
RBC: 4.45 MIL/uL (ref 4.22–5.81)
RDW: 17.2 % — ABNORMAL HIGH (ref 11.5–15.5)
WBC: 5.3 10*3/uL (ref 4.0–10.5)
nRBC: 0 % (ref 0.0–0.2)

## 2020-08-05 MED ORDER — CEPHALEXIN 500 MG PO CAPS: 500.0000 mg | ORAL_CAPSULE | Freq: Three times a day (TID) | ORAL | 0 refills | Status: DC

## 2020-08-05 MED ORDER — CEPHALEXIN 500 MG PO CAPS: 500.0000 mg | ORAL_CAPSULE | Freq: Three times a day (TID) | ORAL | 0 refills | Status: AC

## 2020-08-05 MED ORDER — CEPHALEXIN 250 MG PO CAPS
500.0000 mg | ORAL_CAPSULE | Freq: Once | ORAL | Status: AC
  Administered 2020-08-05: 500 mg via ORAL
  Filled 2020-08-05: qty 2

## 2020-08-05 MED ORDER — ACETAMINOPHEN 325 MG PO TABS
650.0000 mg | ORAL_TABLET | Freq: Once | ORAL | Status: AC
  Administered 2020-08-05: 650 mg via ORAL
  Filled 2020-08-05: qty 2

## 2020-08-05 NOTE — ED Provider Notes (Signed)
Patient's head CT is negative for acute stroke.  It does show some sinusitis which may be the result of the dizziness.  Also patient has evidence of a UTI.  Will discharge home with Keflex.  He has follow-up with his PCP in 2 weeks.  Encouraged him to also request GI follow-up for colonoscopy which he has never gotten before and does complain of intermittent blood in his stool and constipation.  Encouraged him to use Dulcolax regularly which she reports has helped in the past.   Blanchie Dessert, MD 08/05/20 1721

## 2020-08-05 NOTE — ED Provider Notes (Signed)
[facility] EMERGENCY DEPARTMENT Provider Note   CSN: [id number] Arrival date & time: 08/05/20  1014     History Chief Complaint  Patient presents with   Dizziness    Marcus [name] is a 75 y.o. male.  Patient presents to ER chief complaint sensation of dizziness ongoing for about 2 weeks.  Denies any fall or trauma.  Denies any headache or chest pain abdominal pain.  He states he also thought he noticed bright red blood in his stool yesterday.  Denies fevers cough vomiting or diarrhea.      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   Prostate cancer ([place]) 09/04/2013    Past Surgical History:  Procedure Laterality Date   LYMPHADENECTOMY Bilateral 09/04/2013   Procedure: PELVIC LYMPH NODE DISSECTION;  Surgeon: Alexis Frock, MD;  Location: [facility];  Service: Urology;  Laterality: Bilateral;   ROBOT ASSISTED LAPAROSCOPIC RADICAL PROSTATECTOMY N/A 09/04/2013   Procedure: ROBOTIC ASSISTED LAPAROSCOPIC RADICAL PROSTATECTOMY, INDOCYANINE GREEN DYE;  Surgeon: Alexis Frock, MD;  Location: [facility];  Service: Urology;  Laterality: N/A;   TOTAL HIP ARTHROPLASTY Right 05/15/2016   Procedure: RIGHT TOTAL HIP ARTHROPLASTY ANTERIOR APPROACH;  Surgeon: Paralee Cancel, MD;  Location: [facility];  Service: Orthopedics;  Laterality: Right;   WRIST GANGLION EXCISION         Family History  Problem Relation Age of Onset   Seizures Mother    Cancer Father        throat cancer    Social History   Tobacco Use   Smoking status: Every Day     Packs/day: 0.50    Types: Cigarettes   Smokeless tobacco: Never  Vaping Use   Vaping Use: Never used  Substance Use Topics   Alcohol use: Yes    Comment: 2- 40 ouonce beers per week    Drug use: No    Comment: decreased to 3-3 daily    Home Medications Prior to Admission medications   Medication Sig Start Date End Date Taking? Authorizing Provider  amLODipine (NORVASC) 10 MG tablet Take 1 tablet (10 mg total) by mouth daily. 09/17/18 11/16/18  Pokhrel, Corrie Mckusick, MD  atorvastatin (LIPITOR) 40 MG tablet Take 40 mg by mouth every evening. 04/18/16   [provider]  ferrous sulfate (FERROUSUL) 325 (65 FE) MG tablet Take 1 tablet (325 mg total) by mouth daily. 09/17/18   Pokhrel, Corrie Mckusick, MD    Allergies    Patient has no known allergies.  Review of Systems   Review of Systems  Constitutional:  Negative for fever.  HENT:  Negative for ear pain and sore throat.   Eyes:  Negative for pain.  Respiratory:  Negative for cough.   Cardiovascular:  Negative for chest pain.  Gastrointestinal:  Negative for abdominal pain.  Genitourinary:  Negative for flank pain.  Musculoskeletal:  Negative for back pain.  Skin:  Negative for color change and rash.  Neurological:  Negative for syncope.  All other systems reviewed and are negative.  Physical Exam Updated Vital Signs BP (!) 186/101 (BP Location: Right Arm)   Pulse 62   Temp 98.6 F (37 C) (Oral)   Resp 16   Ht '5\' 10"'$  (1.778 m)   Wt 74.8 kg   SpO2 100%   BMI 23.68 kg/m   Physical Exam Constitutional:      General: He is not in acute distress.    Appearance: He is well-developed.  HENT:     Head: Normocephalic.     Nose: Nose normal.  Eyes:     Extraocular Movements: Extraocular movements intact.  Cardiovascular:     Rate and Rhythm: Normal rate.  Pulmonary:     Effort: Pulmonary effort is normal.  Genitourinary:    Comments: Normal-appearing stool yellow in color. Skin:    Coloration: Skin is not jaundiced.   Neurological:     General: No focal deficit present.     Mental Status: He is alert. Mental status is at baseline.     Comments: Gait is normal.  Cranial nerves II through XII intact.  Strength 5/5 all extremities.  Finger-nose intact heel-to-shin intact.    ED Results / Procedures / Treatments   Labs (all labs ordered are listed, but only abnormal results are displayed) Labs Reviewed  BASIC METABOLIC PANEL - Abnormal; Notable for the following components:      Result Value   Glucose, Bld 126 (*)    All other components within normal limits  CBC - Abnormal; Notable for the following components:   Hemoglobin 11.5 (*)    HCT 37.0 (*)    MCH 25.8 (*)    RDW 17.2 (*)    All other components within normal limits  URINALYSIS, ROUTINE W REFLEX MICROSCOPIC - Abnormal; Notable for the following components:   APPearance HAZY (*)    Hgb urine dipstick MODERATE (*)    Nitrite POSITIVE (*)    Leukocytes,Ua MODERATE (*)    WBC, UA >50 (*)    Bacteria, UA MANY (*)    All other components within normal limits  CBG MONITORING, ED  TYPE AND SCREEN    EKG EKG Interpretation  Date/Time:  Friday August 05 2020 10:32:02 EDT Ventricular Rate:  72 PR Interval:  202 QRS Duration: 84 QT Interval:  394 QTC Calculation: 431 R Axis:   -13 Text Interpretation: Normal sinus rhythm Increased R/S ratio in V1, consider early transition or posterior infarct Abnormal ECG Confirmed by Thamas Jaegers ([id number]) on 08/05/2020 2:00:02 PM  Radiology No results found.  Procedures Procedures   Medications Ordered in ED Medications  cephALEXin (KEFLEX) capsule 500 mg (500 mg Oral Given 08/05/20 1436)  acetaminophen (TYLENOL) tablet 650 mg (650 mg Oral Given 08/05/20 1447)    ED Course  I have reviewed the triage vital signs and the nursing notes.  Pertinent labs & imaging results that were available during my care of the patient were reviewed by me and considered in my medical decision making (see chart for  details).    MDM Rules/Calculators/A&P                            labs show normal white count hemoglobin 11.5 which is higher than his baseline in the past.  Urinalysis positive for UTI.  Rectal exam shows normal colored stool yellow in color no bright red bleeding noted.  Patient recommended outpatient follow-up with GI for history of bloody stool.  Patient given dose of Keflex here in the ER.  CT of the brain pursued.  Will be signed out to oncoming provider. Final Clinical Impression(s) / ED Diagnoses Final diagnoses:  Dizziness  Acute cystitis with hematuria    Rx / DC Orders ED Discharge Orders     None        Luna Fuse, MD 08/05/20 [phone]

## 2020-08-05 NOTE — ED Notes (Signed)
Pt provided a bus pass. Pt very pleasant and thanking this nurse and staff for help.

## 2020-08-05 NOTE — ED Triage Notes (Signed)
Pt endorses dizziness for a week. Also reports he started to notice bright red blood in his stool. No neuro deficits noted in triage, pt ambulatory.

## 2020-08-05 NOTE — Discharge Instructions (Addendum)
Your CAT scan did show that you had a sinus infection but no sign of stroke.  You also had a urinary tract infection today.  The antibiotic was sent to your pharmacy.  Also you can start taking Dulcolax daily to help with your bowel movements.

## 2020-08-31 ENCOUNTER — Emergency Department (HOSPITAL_COMMUNITY): Payer: Medicare Other

## 2020-08-31 ENCOUNTER — Other Ambulatory Visit: Payer: Self-pay

## 2020-08-31 ENCOUNTER — Encounter (HOSPITAL_COMMUNITY): Payer: Self-pay

## 2020-08-31 ENCOUNTER — Emergency Department (HOSPITAL_COMMUNITY)
Admission: EM | Admit: 2020-08-31 | Discharge: 2020-08-31 | Disposition: A | Discharge status: Home / Self Care | Payer: Medicare Other | Attending: Emergency Medicine | Admitting: Emergency Medicine

## 2020-08-31 DIAGNOSIS — F17210 Nicotine dependence, cigarettes, uncomplicated: Secondary | ICD-10-CM | POA: Insufficient documentation

## 2020-08-31 DIAGNOSIS — R52 Pain, unspecified: Secondary | ICD-10-CM

## 2020-08-31 DIAGNOSIS — R0781 Pleurodynia: Secondary | ICD-10-CM | POA: Diagnosis present

## 2020-08-31 DIAGNOSIS — Z8546 Personal history of malignant neoplasm of prostate: Secondary | ICD-10-CM | POA: Diagnosis not present

## 2020-08-31 DIAGNOSIS — M899 Disorder of bone, unspecified: Secondary | ICD-10-CM | POA: Insufficient documentation

## 2020-08-31 DIAGNOSIS — I10 Essential (primary) hypertension: Secondary | ICD-10-CM | POA: Insufficient documentation

## 2020-08-31 DIAGNOSIS — Z96641 Presence of right artificial hip joint: Secondary | ICD-10-CM | POA: Diagnosis not present

## 2020-08-31 DIAGNOSIS — Z79899 Other long term (current) drug therapy: Secondary | ICD-10-CM | POA: Diagnosis not present

## 2020-08-31 MED ORDER — DEXAMETHASONE SODIUM PHOSPHATE 10 MG/ML IJ SOLN
10.0000 mg | Freq: Once | INTRAMUSCULAR | Status: AC
  Administered 2020-08-31: 10 mg via INTRAMUSCULAR
  Filled 2020-08-31: qty 1

## 2020-08-31 MED ORDER — IBUPROFEN 600 MG PO TABS: 600.0000 mg | ORAL_TABLET | Freq: Four times a day (QID) | ORAL | 0 refills | Status: DC | PRN

## 2020-08-31 MED ORDER — ACETAMINOPHEN 325 MG PO TABS: 650.0000 mg | ORAL_TABLET | Freq: Four times a day (QID) | ORAL | 0 refills | Status: DC | PRN

## 2020-08-31 MED ORDER — KETOROLAC TROMETHAMINE 60 MG/2ML IM SOLN
60.0000 mg | Freq: Once | INTRAMUSCULAR | Status: AC
  Administered 2020-08-31: 60 mg via INTRAMUSCULAR
  Filled 2020-08-31: qty 2

## 2020-08-31 NOTE — Discharge Planning (Signed)
RNCM consulted regarding transportation needs for pt.  RNCM provided [employer]) as pt has no transportation from hospital.  RNCM presented and explained [facility] and Release of Liability Form.  Pt signed waiver, therefore agreeing to written terms.

## 2020-08-31 NOTE — ED Notes (Signed)
This RN placed 3 hot packs on patient. Bilateral rib and lower back

## 2020-08-31 NOTE — Discharge Instructions (Addendum)
Your CT scans today showed that you had some spots in your spine.  This is concerning for cancer.  You will need to follow-up with a cancer doctor in the clinic for further evaluation and treatment for this.  Please Call the [facility] to schedule a new appointment with an oncologist (cancer doctor):  Address: [facility], [place], [place], [place] [address] Phone: [phone] Appointments: [email or web address]  You should come back to the ER if you begin to have numbness or weakness in your legs, difficulty walking, difficulty urinating, or difficulty keeping down food and water.

## 2020-08-31 NOTE — Discharge Planning (Addendum)
error 

## 2020-08-31 NOTE — ED Triage Notes (Signed)
Lower rib pain radiating to his back x 1 month. Hurts upon movement.   Denies any cough, falls and trauma.

## 2020-08-31 NOTE — ED Provider Notes (Signed)
[facility] EMERGENCY DEPARTMENT Provider Note   CSN: [id number] Arrival date & time: 08/31/20  0310     History CC: Chest wall pain   Marcus [name] is a 75 y.o. male with a history of Paget's disease of the pelvis, presenting to emergency department with bilateral rib pain and back pain.  The patient reports that he had gradual onset of pain about a month ago, which he describes as a soreness in his bilateral rib cage and in his mid thoracic back.  He denies any falls or trauma preceding this.  He reports the pain tends to be worse at night when he is laying down in bed.  In the daytime he takes Tylenol and the pain seems milder, but it is worse with standing up and movement, better with lying down and standing still.  He says the pain is also worse with coughing and deep inspiration.  He has never had the symptoms before.  Pain is currently moderate intensity.  He is a lifelong smoker.  He reports he smokes about 5 to 8 cigarettes/day.  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   Prostate cancer ([place]) 09/04/2013    Past Surgical History:  Procedure Laterality Date   LYMPHADENECTOMY Bilateral 09/04/2013   Procedure: PELVIC LYMPH NODE DISSECTION;  Surgeon: Alexis Frock, MD;  [facility]: [facility];  Service: Urology;  Laterality: Bilateral;   ROBOT ASSISTED LAPAROSCOPIC RADICAL PROSTATECTOMY N/A 09/04/2013   Procedure: ROBOTIC ASSISTED LAPAROSCOPIC RADICAL PROSTATECTOMY, INDOCYANINE GREEN DYE;  Surgeon: Alexis Frock, MD;   Location: [facility];  Service: Urology;  Laterality: N/A;   TOTAL HIP ARTHROPLASTY Right 05/15/2016   Procedure: RIGHT TOTAL HIP ARTHROPLASTY ANTERIOR APPROACH;  Surgeon: Paralee Cancel, MD;  Location: [facility];  Service: Orthopedics;  Laterality: Right;   WRIST GANGLION EXCISION         Family History  Problem Relation Age of Onset   Seizures Mother    Cancer Father        throat cancer    Social History   Tobacco Use   Smoking status: Every Day    Packs/day: 0.50    Types: Cigarettes   Smokeless tobacco: Never  Vaping Use   Vaping Use: Never used  Substance Use Topics   Alcohol use: Yes    Comment: 2- 40 ouonce beers per week    Drug use: No    Comment: decreased to 3-3 daily    Home Medications Prior to Admission medications   Medication Sig Start Date End Date Taking? Authorizing Provider  acetaminophen (TYLENOL) 325 MG tablet Take 2 tablets (650 mg total) by mouth every 6 (six) hours as needed for up to 30 doses for mild pain or moderate pain. 08/31/20  Yes Shawnmichael Parenteau, Carola Rhine, MD  ibuprofen (ADVIL) 600 MG tablet Take 1 tablet (600 mg total) by mouth every 6 (six) hours as needed for up to 30 doses. 08/31/20  Yes  Wyvonnia Dusky, MD  amLODipine (NORVASC) 10 MG tablet Take 1 tablet (10 mg total) by mouth daily. 09/17/18 11/16/18  Pokhrel, Corrie Mckusick, MD  atorvastatin (LIPITOR) 40 MG tablet Take 40 mg by mouth every evening. 04/18/16   [provider]  ferrous sulfate (FERROUSUL) 325 (65 FE) MG tablet Take 1 tablet (325 mg total) by mouth daily. 09/17/18   Pokhrel, Corrie Mckusick, MD    Allergies    Patient has no known allergies.  Review of Systems   Review of Systems  Constitutional:  Negative for chills and fever.  Respiratory:  Negative for cough and shortness of breath.   Cardiovascular:  Positive for chest pain. Negative for palpitations.  Gastrointestinal:  Negative for abdominal pain and vomiting.  Genitourinary:  Negative for dysuria and hematuria.  Musculoskeletal:   Negative for arthralgias and back pain.  Skin:  Negative for color change and rash.  Neurological:  Negative for syncope and headaches.  All other systems reviewed and are negative.  Physical Exam Updated Vital Signs BP (!) 160/67 (BP Location: Left Arm)   Pulse 67   Temp (!) 97 F (36.1 C) (Tympanic)   Resp 16   Ht '5\' 10"'$  (1.778 m)   Wt 74.8 kg   SpO2 100%   BMI 23.66 kg/m   Physical Exam Constitutional:      General: He is not in acute distress. HENT:     Head: Normocephalic and atraumatic.  Eyes:     Conjunctiva/sclera: Conjunctivae normal.     Pupils: Pupils are equal, round, and reactive to light.  Cardiovascular:     Rate and Rhythm: Normal rate and regular rhythm.     Pulses: Normal pulses.  Pulmonary:     Effort: Pulmonary effort is normal. No respiratory distress.  Musculoskeletal:     Comments: Bilateral chest wall pain (nonspecific) and mid-thoracic midline tenderness  Skin:    General: Skin is warm and dry.  Neurological:     General: No focal deficit present.     Mental Status: He is alert. Mental status is at baseline.  Psychiatric:        Mood and Affect: Mood normal.        Behavior: Behavior normal.    ED Results / Procedures / Treatments   Labs (all labs ordered are listed, but only abnormal results are displayed) Labs Reviewed - No data to display  EKG None  Radiology DG Ribs Unilateral Right  Result Date: 08/31/2020 CLINICAL DATA:  Lower rib pain, back pain EXAM: RIGHT RIBS - 2 VIEW COMPARISON:  07/23/2013 FINDINGS: No fracture or other bone lesions are seen involving the ribs. IMPRESSION: Negative. Electronically Signed   By: Rolm Baptise M.D.   On: 08/31/2020 03:53   DG Ribs Unilateral W/Chest Left  Result Date: 08/31/2020 CLINICAL DATA:  Lower rib pain, back pain EXAM: LEFT RIBS AND CHEST - 3+ VIEW COMPARISON:  07/23/2013 FINDINGS: No fracture or other bone lesions are seen involving the ribs. There is no evidence of pneumothorax or  pleural effusion. Both lungs are clear. Heart size and mediastinal contours are within normal limits. IMPRESSION: Negative. Electronically Signed   By: Rolm Baptise M.D.   On: 08/31/2020 03:53   DG Thoracic Spine 2 View  Result Date: 08/31/2020 CLINICAL DATA:  Lower rib pain, back pain EXAM: THORACIC SPINE 2 VIEWS COMPARISON:  None. FINDINGS: There is no evidence of thoracic spine fracture. Alignment is normal. No other significant bone abnormalities are identified. IMPRESSION: Negative. Electronically Signed  By: Rolm Baptise M.D.   On: 08/31/2020 03:52   CT Chest Wo Contrast  Result Date: 08/31/2020 CLINICAL DATA:  Chest pain or shortness of breath with pleurisy or effusion suspected. Bilateral chest wall pain and midthoracic pain for 1 month EXAM: CT CHEST WITHOUT CONTRAST TECHNIQUE: Multidetector CT imaging of the chest was performed following the standard protocol without IV contrast. COMPARISON:  None similar FINDINGS: Cardiovascular: Normal heart size. Small volume pericardial fluid. Scattered atheromatous plaque with a low-density plaque at the isthmus. Mediastinum/Nodes: Negative for adenopathy Lungs/Pleura: Lungs are clear. No pleural effusion or pneumothorax. Upper Abdomen: Low-density in the subcapsular right liver favoring cyst which was seen in this location to a smaller degree in 2015. There are multiple enlarged nodes at the porta hepatis/gastrohepatic ligament with nodes measuring up to 2.5 cm in length. Musculoskeletal: Reference dedicated thoracic spine CT concerning sclerotic bone lesion at T5 and T6 with canal and foraminal involvement. No acute fracture. IMPRESSION: 1. Sclerotic bone lesion at T5 and T6 with canal and foraminal infiltration, reference dedicated thoracic spine reformats. 2. Upper abdominal adenopathy. 3. 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 Electronically Signed   By: Monte Fantasia M.D.   On: 08/31/2020 08:49   CT  T-SPINE NO CHARGE  Result Date: 08/31/2020 CLINICAL DATA:  Back pain for a month EXAM: CT THORACIC SPINE WITHOUT CONTRAST TECHNIQUE: Multidetector CT images of the thoracic were obtained using the standard protocol without intravenous contrast. COMPARISON:  No similar CT comparison FINDINGS: Alignment: Normal Vertebrae: Sclerosis in the vertebral bodies of T5, T7, especially T6. At T6 there is extension in to right-sided pedicle and posterior elements with extraosseous partially ossified tissue effacing the thecal sac from posterolateral and involving the right T5-6 and T6-7 foramina. Negative for fracture deformity. Paraspinal and other soft tissues: Reported separately Disc levels: Less than typical degenerative changes in the spine. IMPRESSION: Sclerotic bone lesion at T5-T7, greatest at T6 where there is posterior element involvement with right-sided epidural tumor and right T5-6, T6-7 foraminal infiltration. Cord deformation/compression is present at T6. There is history of prostate cancer in the past. Electronically Signed   By: Monte Fantasia M.D.   On: 08/31/2020 08:53    Procedures Procedures   Medications Ordered in ED Medications  ketorolac (TORADOL) injection 60 mg (60 mg Intramuscular Given 08/31/20 0840)  dexamethasone (DECADRON) injection 10 mg (10 mg Intramuscular Given 08/31/20 [id number])    ED Course  I have reviewed the triage vital signs and the nursing notes.  Pertinent labs & imaging results that were available during my care of the patient were reviewed by me and considered in my medical decision making (see chart for details).  Ddx includes occult rib fx vs spinal fx vs malignancy vs other The pain is extremely reproducible with movement and on exam, would be very atypical for acute coronary syndrome or PE or aortic dissection.  Likewise he has no other symptoms of infection and suspect pneumonia.  Rib series obtained upon his arrival which shows no acute lesions or fractures.   We discussed a CT scan which I think would be a better evaluation for occult fracture.  He is in agreement with this.  We will give him IM Toradol and IM Decadron as well.  Clinical Course as of 08/31/20 1051  Wed Aug 31, 2020  0858 Presentation is concerning for metastatic lesions to the spine, which may be related to prostate cancer or other malignancy.  Although there is thecal  sac effacement noted on CT scan, the patient does not present with symptoms of central cord compression.  He has excellent strength in his legs and he able to ambulate steadily.  We did give him a dose of Decadron for pain control in the ED, but I would avoid further steroids as he is at higher risk for occult fracture.  We will start him on ibuprofen in addition to his Tylenol, and I gave him a phone number to set up an appointment with the oncology clinic. [MT]    Clinical Course User Index [MT] Wyvonnia Dusky, MD    Final Clinical Impression(s) / ED Diagnoses Final diagnoses:  Rib pain  Lesion of bone of thoracic spine    Rx / DC Orders ED Discharge Orders          Ordered    ibuprofen (ADVIL) 600 MG tablet  Every 6 hours PRN        08/31/20 0908    acetaminophen (TYLENOL) 325 MG tablet  Every 6 hours PRN        08/31/20 0908             Wyvonnia Dusky, MD 08/31/20 1051

## 2020-08-31 NOTE — ED Notes (Signed)
Patient transported to CT 

## 2020-08-31 NOTE — ED Notes (Signed)
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 Pt denies having any family he would like me to call, states, "I have a roommate" after discussing dc instructions with pt he expressed he felt confused and unsure of what to do next. This RN requested Gean Quint CSW to bedside to help set up transport home today as well as getting him set up with [place].  Pt also given [place] sandwich and coke to drink.

## 2020-08-31 NOTE — ED Provider Notes (Signed)
Emergency Medicine Provider Triage Evaluation Note  Marcus [name] , a 75 y.o. male  was evaluated in triage.  Pt complains of bilateral rib and back pain x1 month.  Denies injury, falls, recent cough, etc.  States he has been taking tylenol which eases it down but he wants to know what is causing the pain.  Review of Systems  Positive: Rib pain, back pain Negative: Abdominal pain, vomiting  Physical Exam  BP (!) 147/69 (BP Location: Left Arm)   Pulse 82   Temp 97.9 F (36.6 C) (Oral)   Resp 16   Ht '5\' 10"'$  (1.778 m)   Wt 74.8 kg   SpO2 98%   BMI 23.66 kg/m   Gen:   Awake, no distress   Resp:  Normal effort  MSK:   Moves extremities without difficulty  Other:  Bilateral ribs and thoracic spine tender to touch without noted deformity, NAD  Medical Decision Making  Medically screening exam initiated at 3:10 AM.  Appropriate orders placed.  [name] was informed that the remainder of the evaluation will be completed by another provider, this initial triage assessment does not replace that evaluation, and the importance of remaining in the ED until their evaluation is complete.  Will get plain films of ribs and back.   Larene Pickett, PA-C 08/31/20 [place], DO 08/31/20 [id number]

## 2020-09-07 ENCOUNTER — Telehealth: Payer: Self-pay | Admitting: Hematology

## 2020-09-07 NOTE — Telephone Encounter (Signed)
Received a call from Mr. Marcus [name] to schedule an appt for rib pain/lesion of the bone of thoracic spine. [name] has been cld and scheduled to see Dr. Irene Limbo on 9/1 at 1pm. Pt aware to arrive 20 minutes early.

## 2020-09-15 ENCOUNTER — Inpatient Hospital Stay: Payer: Medicare Other | Attending: Hematology | Admitting: Hematology

## 2020-09-15 ENCOUNTER — Inpatient Hospital Stay: Payer: Medicare Other

## 2020-09-15 ENCOUNTER — Other Ambulatory Visit: Payer: Self-pay

## 2020-09-15 VITALS — BP 119/89 | HR 76 | Temp 98.4°F | Resp 18 | Wt 157.3 lb

## 2020-09-15 DIAGNOSIS — M889 Osteitis deformans of unspecified bone: Secondary | ICD-10-CM | POA: Diagnosis not present

## 2020-09-15 DIAGNOSIS — F17210 Nicotine dependence, cigarettes, uncomplicated: Secondary | ICD-10-CM | POA: Diagnosis not present

## 2020-09-15 DIAGNOSIS — C7951 Secondary malignant neoplasm of bone: Secondary | ICD-10-CM

## 2020-09-15 DIAGNOSIS — C61 Malignant neoplasm of prostate: Secondary | ICD-10-CM

## 2020-09-15 DIAGNOSIS — Z8546 Personal history of malignant neoplasm of prostate: Secondary | ICD-10-CM | POA: Diagnosis not present

## 2020-09-15 DIAGNOSIS — Z923 Personal history of irradiation: Secondary | ICD-10-CM

## 2020-09-15 DIAGNOSIS — D492 Neoplasm of unspecified behavior of bone, soft tissue, and skin: Secondary | ICD-10-CM | POA: Diagnosis not present

## 2020-09-15 LAB — CBC WITH DIFFERENTIAL/PLATELET
Abs Immature Granulocytes: 0.03 10*3/uL (ref 0.00–0.07)
Basophils Absolute: 0 10*3/uL (ref 0.0–0.1)
Basophils Relative: 0 %
Eosinophils Absolute: 0.1 10*3/uL (ref 0.0–0.5)
Eosinophils Relative: 2 %
HCT: 33.3 % — ABNORMAL LOW (ref 39.0–52.0)
Hemoglobin: 11 g/dL — ABNORMAL LOW (ref 13.0–17.0)
Immature Granulocytes: 1 %
Lymphocytes Relative: 15 %
Lymphs Abs: 0.9 10*3/uL (ref 0.7–4.0)
MCH: 25.3 pg — ABNORMAL LOW (ref 26.0–34.0)
MCHC: 33 g/dL (ref 30.0–36.0)
MCV: 76.6 fL — ABNORMAL LOW (ref 80.0–100.0)
Monocytes Absolute: 0.5 10*3/uL (ref 0.1–1.0)
Monocytes Relative: 9 %
Neutro Abs: 4.3 10*3/uL (ref 1.7–7.7)
Neutrophils Relative %: 73 %
Platelets: 291 10*3/uL (ref 150–400)
RBC: 4.35 MIL/uL (ref 4.22–5.81)
RDW: 17.2 % — ABNORMAL HIGH (ref 11.5–15.5)
WBC: 5.9 10*3/uL (ref 4.0–10.5)
nRBC: 0 % (ref 0.0–0.2)

## 2020-09-15 LAB — CMP (CANCER CENTER ONLY)
ALT: 11 U/L (ref 0–44)
AST: 13 U/L — ABNORMAL LOW (ref 15–41)
Albumin: 3.6 g/dL (ref 3.5–5.0)
Alkaline Phosphatase: 105 U/L (ref 38–126)
Anion gap: 14 (ref 5–15)
BUN: 11 mg/dL (ref 8–23)
CO2: 23 mmol/L (ref 22–32)
Calcium: 9.6 mg/dL (ref 8.9–10.3)
Chloride: 102 mmol/L (ref 98–111)
Creatinine: 1.01 mg/dL (ref 0.61–1.24)
GFR, Estimated: >60 mL/min (ref >60)
Glucose, Bld: 104 mg/dL — ABNORMAL HIGH (ref 70–99)
Potassium: 3.4 mmol/L — ABNORMAL LOW (ref 3.5–5.1)
Sodium: 139 mmol/L (ref 135–145)
Total Bilirubin: 0.7 mg/dL (ref 0.3–1.2)
Total Protein: 7.8 g/dL (ref 6.5–8.1)

## 2020-09-15 LAB — RETICULOCYTES
Immature Retic Fract: 15.9 % (ref 2.3–15.9)
RBC.: 4.41 MIL/uL (ref 4.22–5.81)
Retic Count, Absolute: 46.7 10*3/uL (ref 19.0–186.0)
Retic Ct Pct: 1.1 % (ref 0.4–3.1)

## 2020-09-15 LAB — VITAMIN D 25 HYDROXY (VIT D DEFICIENCY, FRACTURES): Vit D, 25-Hydroxy: 15.28 ng/mL — ABNORMAL LOW (ref 30–100)

## 2020-09-15 NOTE — Patient Instructions (Addendum)
Patient instructions Will be doing labs today Please be available for contact to schedule MRI thoracic spine and PET scan We have referred you to radiation oncology for consideration of radiation to the painful thoracic spinal metastases Do not bend lift anything more than 5 pounds to wrist jump or be subjected to any significant vibration or jerks. No climbing on ladders Fall precautions    Thank you for choosing [facility] to provide your care.   Should you have questions after your visit to the [facility]), please contact this office at [phone] between 8:30 AM and 4:30 PM.  Voice mails left after 4:00 PM may not be returned until the following business day.  Calls received after 4:30 PM will be answered by an off-site Nurse Triage Line.    Prescription Refills:  Please have your pharmacy contact us directly for most prescription requests.  Contact the office directly for refills of narcotics (pain medications). Allow 48-72 hours for refills.  Appointments: Please contact the [facility] scheduling department [phone] for questions regarding [facility] appointment scheduling.  Contact the schedulers with any scheduling changes so that your appointment can be rescheduled in a timely manner.   Central Scheduling for [facility] [phone] - Call to schedule procedures such as PET scans, CT scans, MRI, Ultrasound, etc.  To afford each patient quality time with our providers, please arrive 30 minutes before your scheduled appointment time.  If you arrive late for your appointment, you may be asked to reschedule.  We strive to give you quality time with our providers, and arriving late affects you and other patients whose appointments are after yours. If you are a no show for multiple scheduled visits, you may be dismissed from the clinic at the providers discretion.     Resources: [place] [employer] [phone] for additional information on assistance  programs or assistance connecting with community support programs   [place]  [phone]: Information regarding food stamps, Medicaid, and utility assistance [employer] [employer]'s shared-ride transportation service for eligible riders who have a disability that prevents them from riding the fixed route bus.   [place] [phone] Helps people with Medicare understand their rights and benefits, navigate the Medicare system, and secure the quality healthcare they deserve [employer] [phone] Assists patients locate various types of support and financial assistance Cancer Care: 1-800-813-HOPE (669)711-8584) Provides financial assistance, online support groups, medication/co-pay assistance.   Transportation Assistance for appointments at [facility]: [facility] [employer] [phone]  Again, thank you for choosing [facility] for your care.

## 2020-09-15 NOTE — Progress Notes (Signed)
Marland Kitchen   HEMATOLOGY/ONCOLOGY CONSULTATION NOTE  Date of Service: 09/15/2020  Patient Care Team: Rogers Blocker, MD as PCP - General (Internal Medicine)  CHIEF COMPLAINTS/PURPOSE OF CONSULTATION:  Metastatic bone lesions  HISTORY OF PRESENTING ILLNESS:   Marcus [name] is a wonderful 75 y.o. male who has been referred to [place] by [facility] emergency room for evaluation and management of bone lesions.  Patient was seen in the emergency room on 08/05/2020 and again on 08/31/2020 with mid thoracic back pain radiating to bilateral posterior chest wall.  He denies any obvious trauma or falls preceding this. He notes the pain is worse with coughing and deep inspiration and in certain body positions and is alleviated by resting in bed. Patient notes no new bladder or bowel incontinence or other issues.  Notes no new lower extremity weakness. Patient had a CT of the chest on 08/31/2020 in the emergency room which showed Sclerotic bone lesion at T5 and T6 with canal and foraminal infiltration, reference dedicated thoracic spine reformats. 2. Upper abdominal adenopathy. 3. 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  Patient has a history of Gleason's 9 prostate cancer status post robotically assisted laparoscopic radical prostatectomy by Dr. Alexis Frock on 09/04/2013.  He was treated with postoperative radiation for extracapsular extension and positive margins after surgery of his prostate cancer by Dr. Tammi Klippel.  Patient notes that after completion of treatment he was not aware that he should follow-up and was lost to follow-up with no monitoring for his prostate cancer.  He also has had limited primary care physician follow-up.  He recently has reestablish primary care with Dr. Keith Rake.  Patient has also had a history of Paget's disease of the bone.  Has not been on any follow-up or treatment for this.  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  Denies any acute weight loss. No new headaches or focal neurological deficits. Denies any new urinary symptoms. Notes that he has had some loose stools 2-3 times a day over the last month.    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) 09/04/13   Adenocarcinoma    SURGICAL HISTORY: Past Surgical History:  Procedure Laterality Date   LYMPHADENECTOMY Bilateral 09/04/2013   Procedure: PELVIC LYMPH NODE DISSECTION;  Surgeon: Alexis Frock, MD;  Location: [facility];  Service: Urology;  Laterality: Bilateral;   ROBOT ASSISTED LAPAROSCOPIC RADICAL PROSTATECTOMY N/A 09/04/2013   Procedure: ROBOTIC ASSISTED LAPAROSCOPIC RADICAL PROSTATECTOMY, INDOCYANINE GREEN DYE;  Surgeon: Alexis Frock, MD;  Location: [facility];  Service: Urology;  Laterality: N/A;   TOTAL HIP ARTHROPLASTY Right 05/15/2016   Procedure: RIGHT TOTAL HIP ARTHROPLASTY ANTERIOR APPROACH;  Surgeon: Paralee Cancel, MD;  Location: [facility];  Service: Orthopedics;  Laterality: Right;   WRIST GANGLION EXCISION      SOCIAL HISTORY: Social History   Socioeconomic History   Marital status: Legally Separated    Spouse name: Not on file   Number of children: Not on file   Years of education: Not on file   Highest education level: Not on file  Occupational History   Occupation: retired  Tobacco Use   Smoking status: Every  Day    Packs/day: 0.50    Types: Cigarettes   Smokeless tobacco: Never  Vaping Use   Vaping Use: Never used  Substance and Sexual Activity   Alcohol use: Yes    Comment: 2- 40 ouonce beers per week    Drug use: No    Comment: decreased to 3-3 daily   Sexual activity: Not Currently  Other Topics Concern   Not on file  Social History Narrative   Not on file    Social Determinants of Health   Financial Resource Strain: Not on file  Food Insecurity: Not on file  Transportation Needs: Not on file  Physical Activity: Not on file  Stress: Not on file  Social Connections: Not on file  Intimate Partner Violence: Not on file    FAMILY HISTORY: Family History  Problem Relation Age of Onset   Seizures Mother    Cancer Father        throat cancer    ALLERGIES:  has No Known Allergies.  MEDICATIONS:  Current Outpatient Medications  Medication Sig Dispense Refill   ibuprofen (ADVIL) 600 MG tablet Take 1 tablet (600 mg total) by mouth every 6 (six) hours as needed for up to 30 doses. 30 tablet 0   acetaminophen (TYLENOL) 325 MG tablet Take 2 tablets (650 mg total) by mouth every 6 (six) hours as needed for up to 30 doses for mild pain or moderate pain. (Patient not taking: Reported on 09/15/2020) 30 tablet 0   amLODipine (NORVASC) 10 MG tablet Take 1 tablet (10 mg total) by mouth daily. 30 tablet 1   atorvastatin (LIPITOR) 40 MG tablet Take 40 mg by mouth every evening. (Patient not taking: Reported on 09/15/2020)     ferrous sulfate (FERROUSUL) 325 (65 FE) MG tablet Take 1 tablet (325 mg total) by mouth daily. (Patient not taking: Reported on 09/15/2020) 60 tablet 1   No current facility-administered medications for this visit.    REVIEW OF SYSTEMS:    10 Point review of Systems was done is negative except as noted above.  PHYSICAL EXAMINATION: ECOG PERFORMANCE STATUS: 1 - Symptomatic but completely ambulatory  . Vitals:   09/15/20 1255  BP: 119/89  Pulse: 76  Resp: 18  Temp: 98.4 F (36.9 C)  SpO2: 100%   Filed Weights   09/15/20 1255  Weight: 157 lb 4.8 oz (71.4 kg)   .Body mass index is 22.57 kg/m.  GENERAL:alert, in no acute distress and comfortable SKIN: no acute rashes, no significant lesions EYES: conjunctiva are pink and non-injected, sclera anicteric OROPHARYNX: MMM, no exudates, no oropharyngeal erythema or  ulceration NECK: supple, no JVD LYMPH:  no palpable lymphadenopathy in the cervical, axillary or inguinal regions LUNGS: clear to auscultation b/l with normal respiratory effort.  Tenderness to palpation over mid thoracic region HEART: regular rate & rhythm ABDOMEN:  normoactive bowel sounds , non tender, not distended. Extremity: no pedal edema PSYCH: alert & oriented x 3 with fluent speech NEURO: no focal motor/sensory deficits  LABORATORY DATA:  I have reviewed the data as listed  . CBC Latest Ref Rng & Units 08/05/2020 09/17/2018 09/16/2018  WBC 4.0 - 10.5 K/uL 5.3 3.9(L) 6.3  Hemoglobin 13.0 - 17.0 g/dL 11.5(L) 9.9(L) 10.1(L)  Hematocrit 39.0 - 52.0 % 37.0(L) 30.2(L) 31.0(L)  Platelets 150 - 400 K/uL 277 154 149(L)    . CMP Latest Ref Rng & Units 08/05/2020 09/17/2018 09/16/2018  Glucose 70 - 99 mg/dL 126(H) 94 103(H)  BUN 8 - 23  mg/dL _0 Creatinine 0.61 - 1.24 mg/dL 1.22 0.94 1.09  Sodium 135 - 145 mmol/L 135 137 131(L)  Potassium 3.5 - 5.1 mmol/L 3.9 3.3(L) 3.3(L)  Chloride 98 - 111 mmol/L 102 105 103  CO2 22 - 32 mmol/L 25 21(L) 21(L)  Calcium 8.9 - 10.3 mg/dL 9.2 8.1(L) 7.8(L)  Total Protein 6.5 - 8.1 g/dL - - -  Total Bilirubin 0.3 - 1.2 mg/dL - - -  Alkaline Phos 38 - 126 U/L - - -  AST 15 - 41 U/L - - -  ALT 0 - 44 U/L - - -    Biopsies of  (if applicable) revealed: Diagnosis 8/201/2015: 1. Fatty tissue, periprostatic- MATURE ADIPOSE TISSUE, NO EVIDENCE OF MALIGNANCY.2. Lymph node, biopsy, right external- ONE SMALL LYMPH NODE, AND ADJACENT MATURE ADIPOSE TISSUE, NO EVIDENCE OF MALIGNANCY (0/1).3. Lymph node, biopsy, right obturator- ONE LYMPH NODE AND ADJACENT MATURE ADIPOSE TISSUE, NO EVIDENCE OFMALIGNANCY(0/1).4. Lymph node, sentinel, biopsy, right perivesicle - MATURE ADIPOSE TISSUE.- NO LYMPHOID TISSUE IDENTIFIED.- NO EVIDENCE OF MALIGNANCY.5. Lymph node, biopsy, left external- MATURE ADIPOSE TISSUE.- NO LYMPHOID TISSUE IDENTIFIED.- NO EVIDENCE OF MALIGNANCY.6.  Lymph node, biopsy, left obturator- FIVE LYMPH NODES, NEGATIVE FOR METASTATIC CARCINOMA (0/5).7. Prostate, radical resection- PROSTATIC ADENOCARCINOMA, GLEASON'S SCORE 4+5=9 WITH TERTIARY PATTERN 3,INVOLVING BOTH LOBES, WITH EXTRAPROSTATIC EXTENSION PRESENT, INVOLVING THE LEFT AND RIGHT RESECTION MARGINS AND BLADDER NECK MARGIN Microscopic Comment 7. PROSTATERADICAL RESECTION/PROSTATECTOMY Procedure: Prostatectomy.Prostate gland: Weight: 55 grams.Dimension: 4.8 cm apex to base x 5.0 cm right to left x 4.2 cm anterior to posterior.Histologic type: AcinarGleasons Score: 5+0=2DXAJOINO score (if applicable): 3 presentInvolving (half lobe or less, one or both lobes): Both lobes.   RADIOGRAPHIC STUDIES: I have personally reviewed the radiological images as listed and agreed with the findings in the report. DG Ribs Unilateral Right  Result Date: 08/31/2020 CLINICAL DATA:  Lower rib pain, back pain EXAM: RIGHT RIBS - 2 VIEW COMPARISON:  07/23/2013 FINDINGS: No fracture or other bone lesions are seen involving the ribs. IMPRESSION: Negative. Electronically Signed   By: Rolm Baptise M.D.   On: 08/31/2020 03:53   DG Ribs Unilateral W/Chest Left  Result Date: 08/31/2020 CLINICAL DATA:  Lower rib pain, back pain EXAM: LEFT RIBS AND CHEST - 3+ VIEW COMPARISON:  07/23/2013 FINDINGS: No fracture or other bone lesions are seen involving the ribs. There is no evidence of pneumothorax or pleural effusion. Both lungs are clear. Heart size and mediastinal contours are within normal limits. IMPRESSION: Negative. Electronically Signed   By: Rolm Baptise M.D.   On: 08/31/2020 03:53   DG Thoracic Spine 2 View  Result Date: 08/31/2020 CLINICAL DATA:  Lower rib pain, back pain EXAM: THORACIC SPINE 2 VIEWS COMPARISON:  None. FINDINGS: There is no evidence of thoracic spine fracture. Alignment is normal. No other significant bone abnormalities are identified. IMPRESSION: Negative. Electronically Signed   By: Rolm Baptise  M.D.   On: 08/31/2020 03:52   CT Chest Wo Contrast  Result Date: 08/31/2020 CLINICAL DATA:  Chest pain or shortness of breath with pleurisy or effusion suspected. Bilateral chest wall pain and midthoracic pain for 1 month EXAM: CT CHEST WITHOUT CONTRAST TECHNIQUE: Multidetector CT imaging of the chest was performed following the standard protocol without IV contrast. COMPARISON:  None similar FINDINGS: Cardiovascular: Normal heart size. Small volume pericardial fluid. Scattered atheromatous plaque with a low-density plaque at the isthmus. Mediastinum/Nodes: Negative for adenopathy Lungs/Pleura: Lungs are clear. No pleural effusion or pneumothorax. Upper Abdomen: Low-density in the  subcapsular right liver favoring cyst which was seen in this location to a smaller degree in 2015. There are multiple enlarged nodes at the porta hepatis/gastrohepatic ligament with nodes measuring up to 2.5 cm in length. Musculoskeletal: Reference dedicated thoracic spine CT concerning sclerotic bone lesion at T5 and T6 with canal and foraminal involvement. No acute fracture. IMPRESSION: 1. Sclerotic bone lesion at T5 and T6 with canal and foraminal infiltration, reference dedicated thoracic spine reformats. 2. Upper abdominal adenopathy. 3. 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 Electronically Signed   By: Monte Fantasia M.D.   On: 08/31/2020 08:49   CT T-SPINE NO CHARGE  Result Date: 08/31/2020 CLINICAL DATA:  Back pain for a month EXAM: CT THORACIC SPINE WITHOUT CONTRAST TECHNIQUE: Multidetector CT images of the thoracic were obtained using the standard protocol without intravenous contrast. COMPARISON:  No similar CT comparison FINDINGS: Alignment: Normal Vertebrae: Sclerosis in the vertebral bodies of T5, T7, especially T6. At T6 there is extension in to right-sided pedicle and posterior elements with extraosseous partially ossified tissue effacing the thecal sac from  posterolateral and involving the right T5-6 and T6-7 foramina. Negative for fracture deformity. Paraspinal and other soft tissues: Reported separately Disc levels: Less than typical degenerative changes in the spine. IMPRESSION: Sclerotic bone lesion at T5-T7, greatest at T6 where there is posterior element involvement with right-sided epidural tumor and right T5-6, T6-7 foraminal infiltration. Cord deformation/compression is present at T6. There is history of prostate cancer in the past. Electronically Signed   By: Monte Fantasia M.D.   On: 08/31/2020 08:53    ASSESSMENT & PLAN:    75 year old with previous history of high risk prostate cancer lost to follow-up presenting with  #1 T5-T7 sclerotic bone lesion involving posterior elements with right-sided epidural tumor and T5-6 T6-7 foraminal infiltration. Concern for spinal cord deformation/compression. Patient has no clinical symptoms of cord compression at this time with no bowel or bladder symptoms or lower extremity weakness. Findings are concerning for metastatic prostate cancer. Rule out myeloma or other primary cancer with bone mets.  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 Tresa Moore.  Had extracapsular extension and positive resection margins. Status post postoperative radiation therapy by Dr. Tammi Klippel. Lost to follow-up after treatment.  #3 active smoker half a pack per day #4 poor medical follow-up Plan -I discussed the imaging results and his previous history of high risk prostate cancer in detail with the patient. He seems to have somewhat limited medical understanding. -He does note that he has now set up primary care follow-up with Dr. Keith Rake and has been trying to get his medical care in order. -We discussed the concerning findings in mid thoracic spine with potential impending cord compression.  Patient at this time is  not having any clinical symptoms of cord compression. -He was recommended not to do any lifting twisting bending or any other jerking activities that might put pressure on the thoracic spine. -Stat MRI of the thoracic spine ordered. -He will likely need neurosurgical consultation if there is any impending cord compression or significant spinal involvement.  They were not consulted based on his CT scan results in the emergency room. -We will hold off on starting steroids till he gets his MRI. -Labs ordered as noted below for additional work-up including myeloma labs PSA level vitamin D levels -Whole-body PET CT scan to evaluate extent  of metastatic disease and also to evaluate for other potential primary site. -We will likely need systemic prostate cancer treatment if this is confirmed to be metastatic prostate cancer.  At this time his mid thoracic spine lesions appear to be the most symptomatic presentation and require urgent urgent attention.  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 Referral to dental for clearance to use bisphosphonates. Return to clinic with Dr. Irene Limbo in 10 days.  . Orders Placed This Encounter  Procedures   NM PET Image Initial (PI) Whole Body    Standing Status:   Future    Standing Expiration Date:   09/15/2021    Order Specific Question:   If indicated for the ordered procedure, I authorize the administration of a radiopharmaceutical per Radiology protocol    Answer:   Yes    Order Specific Question:   Preferred imaging location?    Answer:   [place]   MR THORACIC SPINE W WO CONTRAST    No Pre Auth Required per Office    Standing Status:   Future    Number of Occurrences:   1    Standing Expiration Date:   09/15/2021     Order Specific Question:   GRA to provide read?    Answer:   Yes    Order Specific Question:   If indicated for the ordered procedure, I authorize the administration of contrast media per Radiology protocol    Answer:   Yes    Order Specific Question:   What is the patient's sedation requirement?    Answer:   No Sedation    Order Specific Question:   Use SRS Protocol?    Answer:   No    Order Specific Question:   Does the patient have a pacemaker or implanted devices?    Answer:   No    Order Specific Question:   Preferred imaging location?    Answer:   [facility] (table limit - 550 lbs)    Order Specific Question:   Call Results- Best Contact Number?    Answer:   [phone]- pt can leave   CBC with Differential/Platelet    Standing Status:   Future    Number of Occurrences:   1    Standing Expiration Date:   09/15/2021   CMP ([place] only)    Standing Status:   Future    Number of Occurrences:   1    Standing Expiration Date:   09/15/2021   Prostate-Specific AG, Serum    Standing Status:   Future    Number of Occurrences:   1    Standing Expiration Date:   09/15/2021   Vitamin D 25 hydroxy    Standing Status:   Future    Number of Occurrences:   1    Standing Expiration Date:   09/15/2021   Multiple Myeloma Panel (SPEP&IFE w/QIG)    Standing Status:   Future    Number of Occurrences:   1    Standing Expiration Date:   09/15/2021   Kappa/lambda light chains    Standing Status:   Future    Number of Occurrences:   1    Standing Expiration Date:   09/15/2021   Reticulocytes    Standing Status:  Future    Number of Occurrences:   1    Standing Expiration Date:   09/15/2021   Ambulatory referral to Radiation Oncology    Referral Priority:   Routine    Referral Type:   Consultation    Referral Reason:   Specialty Services Required    Requested Specialty:   Radiation Oncology    Number of Visits Requested:   1   Ambulatory referral to Dentistry    Referral  Priority:   Urgent    Referral Type:   Consultation    Referral Reason:   Specialty Services Required    Requested Specialty:   Dental General Practice    Number of Visits Requested:   1     All of the patients questions were answered with apparent satisfaction. The patient knows to call the clinic with any problems, questions or concerns.  I spent 60 minutes counseling the patient face to face. The total time spent in the appointment was 80 minutes and more than 50% was on counseling and direct patient cares.    Marcus Lone MD MS AAHIVMS [facility] Hematology/Oncology Physician [place]  (Office):       [phone] (Work cell):  [phone] (Fax):           [phone]  09/15/2020 1:18 PM   ADDENDUM  Patient had his MRI of the thoracic spine and Dr. Chryl Heck was called with the results and try to get in touch with the patient and finally his family to ask him to go emergently to the ER for urgent neurosurgical consultation for possible cord compression.  MRI T spine 09/16/2020 Osseous lesions within the T5, T6, T7 and possibly T8 vertebra, as well as right 6th rib as detailed. These findings are highly suspicious for osseous metastatic disease given the patient's history of prostate cancer.   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 Subtle signal abnormality is questioned within the spinal cord at this level, and this may reflect focal edema and/or myelomalacia.   Tumor also encroaches upon the right T5-T6 and T6-T7 neural foramina.     Electronically Signed   By: Kellie Simmering D.O.   On: 09/16/2020 20:04

## 2020-09-16 ENCOUNTER — Other Ambulatory Visit: Payer: Self-pay | Admitting: Hematology and Oncology

## 2020-09-16 ENCOUNTER — Ambulatory Visit (HOSPITAL_COMMUNITY)
Admission: RE | Admit: 2020-09-16 | Discharge: 2020-09-16 | Disposition: A | Discharge status: Home / Self Care | Payer: Medicare Other | Source: Ambulatory Visit | Attending: Hematology | Admitting: Hematology

## 2020-09-16 DIAGNOSIS — C61 Malignant neoplasm of prostate: Secondary | ICD-10-CM

## 2020-09-16 DIAGNOSIS — C7951 Secondary malignant neoplasm of bone: Secondary | ICD-10-CM

## 2020-09-16 LAB — KAPPA/LAMBDA LIGHT CHAINS
Kappa free light chain: 33.8 mg/L — ABNORMAL HIGH (ref 3.3–19.4)
Kappa, lambda light chain ratio: 0.97 (ref 0.26–1.65)
Lambda free light chains: 34.9 mg/L — ABNORMAL HIGH (ref 5.7–26.3)

## 2020-09-16 LAB — PROSTATE-SPECIFIC AG, SERUM (LABCORP): Prostate Specific Ag, Serum: 28.7 ng/mL — ABNORMAL HIGH (ref 0.0–4.0)

## 2020-09-16 MED ORDER — GADOBUTROL 1 MMOL/ML IV SOLN
7.5000 mL | Freq: Once | INTRAVENOUS | Status: AC | PRN
  Administered 2020-09-16: 7.5 mL via INTRAVENOUS

## 2020-09-16 NOTE — Progress Notes (Unsigned)
I called Mr Marcus [name] on all the numbers on file multiple times. His mobile number and his daughter number were incorrect. I called his home phone but the voicemail said a different name, so couldn't leave any patient identifiers, but left a couple messages that there is an urgent message from the cancer center and to call us back asap. I tried calling work number but ofcourse couldn't leave a voicemail not knowing if this is a general number or even a correct number. Left our call back number. We got a call from radiology that MRI spine showed cord compression. Plan was to ask the patient to get to the hospital asap, consider IV dex and surgery consult. I am unable to reach the patient at this time despite multiple phone calls.I have called about 10 times to try and reach him. According to Marcus [name], he was clinically asymptomatic when he saw him I will try calling him again tomorrow.  Marcus [name]

## 2020-09-17 ENCOUNTER — Other Ambulatory Visit: Payer: Self-pay

## 2020-09-17 ENCOUNTER — Encounter (HOSPITAL_COMMUNITY): Payer: Self-pay

## 2020-09-17 ENCOUNTER — Inpatient Hospital Stay (HOSPITAL_COMMUNITY)
Admission: EM | Admit: 2020-09-17 | Discharge: 2020-09-22 | DRG: 454 | Disposition: A | Discharge status: Home Health | Payer: Medicare Other | Attending: Neurosurgery | Admitting: Neurosurgery

## 2020-09-17 ENCOUNTER — Encounter: Payer: Self-pay | Admitting: Hematology and Oncology

## 2020-09-17 DIAGNOSIS — G9520 Unspecified cord compression: Secondary | ICD-10-CM

## 2020-09-17 DIAGNOSIS — Z419 Encounter for procedure for purposes other than remedying health state, unspecified: Secondary | ICD-10-CM

## 2020-09-17 DIAGNOSIS — Z96641 Presence of right artificial hip joint: Secondary | ICD-10-CM | POA: Diagnosis present

## 2020-09-17 DIAGNOSIS — Z79899 Other long term (current) drug therapy: Secondary | ICD-10-CM | POA: Diagnosis not present

## 2020-09-17 DIAGNOSIS — Z8546 Personal history of malignant neoplasm of prostate: Secondary | ICD-10-CM | POA: Diagnosis not present

## 2020-09-17 DIAGNOSIS — C7951 Secondary malignant neoplasm of bone: Principal | ICD-10-CM | POA: Diagnosis present

## 2020-09-17 DIAGNOSIS — F32A Depression, unspecified: Secondary | ICD-10-CM | POA: Diagnosis present

## 2020-09-17 DIAGNOSIS — F17210 Nicotine dependence, cigarettes, uncomplicated: Secondary | ICD-10-CM | POA: Diagnosis present

## 2020-09-17 DIAGNOSIS — Z9079 Acquired absence of other genital organ(s): Secondary | ICD-10-CM

## 2020-09-17 DIAGNOSIS — I10 Essential (primary) hypertension: Secondary | ICD-10-CM | POA: Diagnosis present

## 2020-09-17 DIAGNOSIS — Z20822 Contact with and (suspected) exposure to covid-19: Secondary | ICD-10-CM | POA: Diagnosis present

## 2020-09-17 DIAGNOSIS — G8220 Paraplegia, unspecified: Secondary | ICD-10-CM | POA: Diagnosis present

## 2020-09-17 DIAGNOSIS — D492 Neoplasm of unspecified behavior of bone, soft tissue, and skin: Secondary | ICD-10-CM | POA: Diagnosis present

## 2020-09-17 LAB — CBC WITH DIFFERENTIAL/PLATELET
Abs Immature Granulocytes: 0.03 10*3/uL (ref 0.00–0.07)
Basophils Absolute: 0 10*3/uL (ref 0.0–0.1)
Basophils Relative: 0 %
Eosinophils Absolute: 0.1 10*3/uL (ref 0.0–0.5)
Eosinophils Relative: 2 %
HCT: 35.1 % — ABNORMAL LOW (ref 39.0–52.0)
Hemoglobin: 10.8 g/dL — ABNORMAL LOW (ref 13.0–17.0)
Immature Granulocytes: 1 %
Lymphocytes Relative: 16 %
Lymphs Abs: 1 10*3/uL (ref 0.7–4.0)
MCH: 24.7 pg — ABNORMAL LOW (ref 26.0–34.0)
MCHC: 30.8 g/dL (ref 30.0–36.0)
MCV: 80.1 fL (ref 80.0–100.0)
Monocytes Absolute: 0.6 10*3/uL (ref 0.1–1.0)
Monocytes Relative: 9 %
Neutro Abs: 4.3 10*3/uL (ref 1.7–7.7)
Neutrophils Relative %: 72 %
Platelets: 293 10*3/uL (ref 150–400)
RBC: 4.38 MIL/uL (ref 4.22–5.81)
RDW: 17 % — ABNORMAL HIGH (ref 11.5–15.5)
WBC: 6 10*3/uL (ref 4.0–10.5)
nRBC: 0 % (ref 0.0–0.2)

## 2020-09-17 LAB — COMPREHENSIVE METABOLIC PANEL
ALT: 13 U/L (ref 0–44)
AST: 17 U/L (ref 15–41)
Albumin: 3.7 g/dL (ref 3.5–5.0)
Alkaline Phosphatase: 85 U/L (ref 38–126)
Anion gap: 10 (ref 5–15)
BUN: 12 mg/dL (ref 8–23)
CO2: 24 mmol/L (ref 22–32)
Calcium: 9.4 mg/dL (ref 8.9–10.3)
Chloride: 107 mmol/L (ref 98–111)
Creatinine, Ser: 0.95 mg/dL (ref 0.61–1.24)
GFR, Estimated: >60 mL/min (ref >60)
Glucose, Bld: 89 mg/dL (ref 70–99)
Potassium: 3.3 mmol/L — ABNORMAL LOW (ref 3.5–5.1)
Sodium: 141 mmol/L (ref 135–145)
Total Bilirubin: 0.8 mg/dL (ref 0.3–1.2)
Total Protein: 7.6 g/dL (ref 6.5–8.1)

## 2020-09-17 LAB — RESP PANEL BY RT-PCR (FLU A&B, COVID) ARPGX2
Influenza A by PCR: NEGATIVE
Influenza B by PCR: NEGATIVE
SARS Coronavirus 2 by RT PCR: NEGATIVE

## 2020-09-17 LAB — PREPARE RBC (CROSSMATCH)

## 2020-09-17 LAB — PROTIME-INR
INR: 1 (ref 0.8–1.2)
Prothrombin Time: 13.4 seconds (ref 11.4–15.2)

## 2020-09-17 MED ORDER — ATORVASTATIN CALCIUM 40 MG PO TABS
40.0000 mg | ORAL_TABLET | Freq: Every evening | ORAL | Status: DC
  Administered 2020-09-17 – 2020-09-21 (×4): 40 mg via ORAL
  Filled 2020-09-17 (×5): qty 1

## 2020-09-17 MED ORDER — DEXAMETHASONE SODIUM PHOSPHATE 10 MG/ML IJ SOLN
10.0000 mg | Freq: Four times a day (QID) | INTRAMUSCULAR | Status: DC
  Administered 2020-09-17 – 2020-09-22 (×18): 10 mg via INTRAVENOUS
  Filled 2020-09-17 (×18): qty 1

## 2020-09-17 MED ORDER — ACETAMINOPHEN 325 MG PO TABS: 650.0000 mg | ORAL_TABLET | ORAL | Status: DC | PRN

## 2020-09-17 MED ORDER — POLYETHYLENE GLYCOL 3350 17 G PO PACK: 17.0000 g | PACK | Freq: Every day | ORAL | Status: DC | PRN

## 2020-09-17 MED ORDER — PANTOPRAZOLE SODIUM 40 MG PO TBEC
40.0000 mg | DELAYED_RELEASE_TABLET | Freq: Every day | ORAL | Status: DC
  Administered 2020-09-17 – 2020-09-22 (×5): 40 mg via ORAL
  Filled 2020-09-17 (×5): qty 1

## 2020-09-17 MED ORDER — SODIUM CHLORIDE 0.9% IV SOLUTION: Freq: Once | INTRAVENOUS | Status: DC

## 2020-09-17 MED ORDER — FERROUS SULFATE 325 (65 FE) MG PO TABS
325.0000 mg | ORAL_TABLET | Freq: Every day | ORAL | Status: DC
  Administered 2020-09-17 – 2020-09-22 (×5): 325 mg via ORAL
  Filled 2020-09-17 (×5): qty 1

## 2020-09-17 MED ORDER — AMLODIPINE BESYLATE 10 MG PO TABS
10.0000 mg | ORAL_TABLET | Freq: Every day | ORAL | Status: DC
  Administered 2020-09-17 – 2020-09-22 (×5): 10 mg via ORAL
  Filled 2020-09-17: qty 2
  Filled 2020-09-17 (×4): qty 1

## 2020-09-17 MED ORDER — ONDANSETRON HCL 4 MG/2ML IJ SOLN: 4.0000 mg | Freq: Four times a day (QID) | INTRAMUSCULAR | Status: DC | PRN

## 2020-09-17 MED ORDER — DEXAMETHASONE SODIUM PHOSPHATE 10 MG/ML IJ SOLN
10.0000 mg | Freq: Once | INTRAMUSCULAR | Status: AC
  Administered 2020-09-17: 10 mg via INTRAVENOUS
  Filled 2020-09-17: qty 1

## 2020-09-17 MED ORDER — DOCUSATE SODIUM 100 MG PO CAPS: 100.0000 mg | ORAL_CAPSULE | Freq: Two times a day (BID) | ORAL | Status: DC | PRN

## 2020-09-17 MED ORDER — POTASSIUM CHLORIDE IN NACL 20-0.9 MEQ/L-% IV SOLN
INTRAVENOUS | Status: DC
  Filled 2020-09-17: qty 1000

## 2020-09-17 NOTE — ED Provider Notes (Signed)
Emergency Medicine Provider Triage Evaluation Note  Marcus [name] , a 75 y.o. male  was evaluated in triage.   Sent here by doctor after MRI spine with concerns for cord compression. Denies enuresis or incontinance. No numbness or tingling. No weakness. Only has back pain with sciatica down bilateral legs. Hx of prostate cancer. MRI concerning for metastatic disease and cord compression. MD sent here for neurosurgical consult and steroids. Placed basic labs for possible admission to surgery.   Review of Systems  Positive: Lower back pain, sciatica Negative: Numbness, tingling, weakness, bowel or bladder incontinence.   Physical Exam  BP 134/71 (BP Location: Left Arm)   Pulse 85   Temp 98.8 F (37.1 C) (Oral)   Resp 16   Ht '5\' 10"'$  (1.778 m)   Wt 70.6 kg   SpO2 100%   BMI 22.34 kg/m  Gen:   Awake, no distress   Resp:  Normal effort  MSK:   Moves extremities without difficulty  Other:  No midline spine tenderness. Neurologically intact.   Medical Decision Making  Medically screening exam initiated at 1:04 PM.  Appropriate orders placed.  Marcus [name] was informed that the remainder of the evaluation will be completed by another provider, this initial triage assessment does not replace that evaluation, and the importance of remaining in the ED until their evaluation is complete.   Adolphus Birchwood, PA-C 09/17/20 [place], DO 09/21/20 [id number]

## 2020-09-17 NOTE — ED Notes (Signed)
Attempted to call report to 5N at [facility]. No answer. Will attempt again shortly

## 2020-09-17 NOTE — ED Notes (Signed)
[facility] present for pt transfer

## 2020-09-17 NOTE — ED Notes (Signed)
[facility] called for pt transfer to [facility] 5N

## 2020-09-17 NOTE — Consult Note (Signed)
Reason for Consult: Thoracic spine tumor, paraparesis Referring Physician: Dr. Cathlean Marseilles is an 75 y.o. male.  HPI: The patient is a 75 year old black male smoker with a history of prostate cancer resected in 2018.  He presented to the [facility] ER on 08/31/2020.  He was worked up with CT scans which demonstrated a T6 lesion.  The patient was referred to oncology.  He was worked up further with a thoracic MRI which was done yesterday.  This demonstrated the known T6 lesion with significant spinal cord compression.  Dr. Chryl Heck, the on-call oncologist was contacted.  She made multiple attempts to contact the patient.  She eventually got message to the patient to go to [facility], ER for admission and IV steroids.  The patient instead came to [place], ER.  He ate potato chips in the ER waiting room at approximately 1600.  Presently the patient is alert and pleasant.  He admits to some vague leg weakness but can walk.  He denies significant numbness.  He has had some chronic urinary "dribbling" since his prostate surgery but this has not changed recently.  He denies anticoagulation.  He complains of mid thoracic spine pain bilaterally.  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    Prostate cancer ([place]) 09/04/13   Adenocarcinoma    Past Surgical History:  Procedure Laterality Date   LYMPHADENECTOMY Bilateral 09/04/2013   Procedure: PELVIC LYMPH NODE DISSECTION;  Surgeon: Alexis Frock, MD;  Location: [facility] ORS;  Service: Urology;  Laterality: Bilateral;   ROBOT ASSISTED LAPAROSCOPIC RADICAL PROSTATECTOMY N/A 09/04/2013   Procedure: ROBOTIC ASSISTED LAPAROSCOPIC RADICAL PROSTATECTOMY, INDOCYANINE GREEN DYE;  Surgeon: Alexis Frock, MD;  Location: [facility];  Service: Urology;  Laterality: N/A;   TOTAL HIP ARTHROPLASTY  Right 05/15/2016   Procedure: RIGHT TOTAL HIP ARTHROPLASTY ANTERIOR APPROACH;  Surgeon: Paralee Cancel, MD;  Location: [facility];  Service: Orthopedics;  Laterality: Right;   WRIST GANGLION EXCISION      Family History  Problem Relation Age of Onset   Seizures Mother    Cancer Father        throat cancer    Social History:  reports that he has been smoking cigarettes. He has been smoking an average of .25 packs per day. He has never used smokeless tobacco. He reports that he does not currently use alcohol. He reports that he does not use drugs.  Allergies: No Known Allergies  Medications: I have reviewed the patient's current medications. Prior to Admission: (Not in a hospital admission)  Scheduled:  dexamethasone (DECADRON) injection  10 mg Intravenous Once   Continuous: PRN: Anti-infectives (From admission, onward)    None        Results for orders placed or performed during the hospital encounter of 09/17/20 (from the past 48 hour(s))  Comprehensive metabolic panel     Status: Abnormal   Collection Time: 09/17/20  1:15 PM  Result Value Ref Range   Sodium 141 135 - 145 mmol/L   Potassium 3.3 (L) 3.5 - 5.1 mmol/L   Chloride 107 98 - 111 mmol/L   CO2 24 22 - 32 mmol/L   Glucose, Bld 89 70 - 99 mg/dL    Comment: Glucose reference range applies only to samples taken  after fasting for at least 8 hours.   BUN 12 8 - 23 mg/dL   Creatinine, Ser 0.95 0.61 - 1.24 mg/dL   Calcium 9.4 8.9 - 10.3 mg/dL   Total Protein 7.6 6.5 - 8.1 g/dL   Albumin 3.7 3.5 - 5.0 g/dL   AST 17 15 - 41 U/L   ALT 13 0 - 44 U/L   Alkaline Phosphatase 85 38 - 126 U/L   Total Bilirubin 0.8 0.3 - 1.2 mg/dL   GFR, Estimated >60 >60 mL/min    Comment: (NOTE) Calculated using the CKD-EPI Creatinine Equation (2021)    Anion gap 10 5 - 15    Comment: Performed at [facility], [place] [address], [place], [place] [address]  CBC with Differential     Status: Abnormal   Collection Time: 09/17/20   1:15 PM  Result Value Ref Range   WBC 6.0 4.0 - 10.5 K/uL   RBC 4.38 4.22 - 5.81 MIL/uL   Hemoglobin 10.8 (L) 13.0 - 17.0 g/dL   HCT 35.1 (L) 39.0 - 52.0 %   MCV 80.1 80.0 - 100.0 fL   MCH 24.7 (L) 26.0 - 34.0 pg   MCHC 30.8 30.0 - 36.0 g/dL   RDW 17.0 (H) 11.5 - 15.5 %   Platelets 293 150 - 400 K/uL   nRBC 0.0 0.0 - 0.2 %   Neutrophils Relative % 72 %   Neutro Abs 4.3 1.7 - 7.7 K/uL   Lymphocytes Relative 16 %   Lymphs Abs 1.0 0.7 - 4.0 K/uL   Monocytes Relative 9 %   Monocytes Absolute 0.6 0.1 - 1.0 K/uL   Eosinophils Relative 2 %   Eosinophils Absolute 0.1 0.0 - 0.5 K/uL   Basophils Relative 0 %   Basophils Absolute 0.0 0.0 - 0.1 K/uL   Immature Granulocytes 1 %   Abs Immature Granulocytes 0.03 0.00 - 0.07 K/uL    Comment: Performed at [facility], [place] [address] [place], [place] [address]  Resp Panel by RT-PCR (Flu A&B, Covid) Nasopharyngeal Swab     Status: None   Collection Time: 09/17/20  4:20 PM   Specimen: Nasopharyngeal Swab; Nasopharyngeal(NP) swabs in vial transport medium  Result Value Ref Range   SARS Coronavirus 2 by RT PCR NEGATIVE NEGATIVE    Comment: (NOTE) SARS-CoV-2 target nucleic acids are NOT DETECTED.  The SARS-CoV-2 RNA is generally detectable in upper respiratory specimens during the acute phase of infection. The lowest concentration of SARS-CoV-2 viral copies this assay can detect is 138 copies/mL. A negative result does not preclude SARS-Cov-2 infection and should not be used as the sole basis for treatment or other patient management decisions. A negative result may occur with  improper specimen collection/handling, submission of specimen other than nasopharyngeal swab, presence of viral mutation(s) within the areas targeted by this assay, and inadequate number of viral copies(<138 copies/mL). A negative result must be combined with clinical observations, patient history, and epidemiological information. The expected result  is Negative.  Fact Sheet for Patients:  EntrepreneurPulse.com.au  Fact Sheet for Healthcare Providers:  IncredibleEmployment.be  This test is no t yet approved or cleared by the [place] FDA and  has been authorized for detection and/or diagnosis of SARS-CoV-2 by FDA under an Emergency Use Authorization (EUA). This EUA will remain  in effect (meaning this test can be used) for the duration of the COVID-19 declaration under Section 564(b)(1) of the Act, 21 U.S.C.section 360bbb-3(b)(1), unless the authorization is terminated  or revoked sooner.       Influenza A by PCR NEGATIVE NEGATIVE   Influenza B by PCR NEGATIVE NEGATIVE    Comment: (NOTE) The Xpert Xpress SARS-CoV-2/FLU/RSV plus assay is intended as an aid in the diagnosis of influenza from Nasopharyngeal swab specimens and should not be used as a sole basis for treatment. Nasal washings and aspirates are unacceptable for Xpert Xpress SARS-CoV-2/FLU/RSV testing.  Fact Sheet for Patients: EntrepreneurPulse.com.au  Fact Sheet for Healthcare Providers: IncredibleEmployment.be  This test is not yet approved or cleared by the [place] FDA and has been authorized for detection and/or diagnosis of SARS-CoV-2 by FDA under an Emergency Use Authorization (EUA). This EUA will remain in effect (meaning this test can be used) for the duration of the COVID-19 declaration under Section 564(b)(1) of the Act, 21 U.S.C. section 360bbb-3(b)(1), unless the authorization is terminated or revoked.  Performed at [facility], [place] [address] [place], [place] [address]     MR THORACIC SPINE W WO CONTRAST  Result Date: 09/16/2020 CLINICAL DATA:  Cord compression. 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 Evaluate for bone metastases. EXAM: MRI THORACIC WITHOUT AND WITH CONTRAST TECHNIQUE: Multiplanar and multiecho pulse  sequences of the thoracic spine were obtained without and with intravenous contrast. CONTRAST:  7.36m GADAVIST GADOBUTROL 1 MMOL/ML IV SOLN COMPARISON:  CT of the thoracic spine 08/31/2020. FINDINGS: Alignment:  No significant spondylolisthesis. Vertebrae: Osseous lesions within the posterior T5 vertebral body and T5 posterior elements, as well as T6 vertebral body and posterior elements. Involvement of the T6 vertebral body and posterior elements is extensive. In addition to involvement of the right T6 transverse process, there is also involvement of the posterior right sixth rib. There is also involvement of the bilateral T7 pedicles/articular pillars, T7 spinous process, right T7 transverse process, and subtle involvement of the posterior T7 vertebral body. There is subtle heterogeneous enhancement of the T8 spinous process, which could reflect an additional lesion (series 23, image 8). Aditionally, there is partially ossified enhancing epidural tumor within the right posterolateral aspect of the spinal canal at the T5-T6 level. Resultant severe spinal canal stenosis with spinal cord compression. There may be subtle T2 hyperintense signal abnormality within the spinal cord at this level compatible with myelomalacia and/or focal edema (series 17, image 10). Additionally, tumor encroaches upon the right T5-T6 and T6-T7 neural foramina. Cord: Significant spinal cord compression at T5-T6 by tumor, as described above. Possible subtle T2/STIR hyperintense signal abnormality within the spinal cord at this level, which may reflect focal edema and/or myelomalacia (series 17, image 10). Paraspinal and other soft tissues: No abnormality identified within included portions of the thorax or upper abdomen/retroperitoneum. Disc levels: No more than mild disc degeneration within the thoracic spine. No significant focal disc herniation or degenerative spinal canal stenosis. Mild facet arthrosis within the lower thoracic spine. No  degenerative compressive foraminal stenosis. Impressions 1 and 2 will be called to the ordering clinician or representative by the Radiologist Assistant, and communication documented in the PACS or [employer] IMPRESSION: Osseous lesions within the T5, T6, T7 and possibly T8 vertebra, as well as right 6th rib as detailed. These findings are highly suspicious for osseous metastatic disease given the patient's history of prostate cancer. 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 Subtle signal abnormality is questioned within the spinal cord at this level, and  this may reflect focal edema and/or myelomalacia. Tumor also encroaches upon the right T5-T6 and T6-T7 neural foramina. Electronically Signed   By: Kellie Simmering D.O.   On: 09/16/2020 20:04    ROS: As above.  He denies neck pain Blood pressure (!) 196/82, pulse 66, temperature 98.8 F (37.1 C), temperature source Oral, resp. rate 18, height '5\' 10"'$  (1.778 m), weight 70.6 kg, SpO2 100 %. Estimated body mass index is 22.34 kg/m as calculated from the following:   Height as of this encounter: '5\' 10"'$  (1.778 m).   Weight as of this encounter: 70.6 kg.  Physical Exam  General: An alert pleasant 75 year old black male in no apparent distress.  HEENT: Normocephalic, atraumatic, bilateral arcus senilis, extraocular muscles are intact  Thorax: Symmetric  Abdomen: Soft  Extremities: Unremarkable  Back exam: Unremarkable  Neurologic exam: The patient is alert and oriented x3.  Glasgow Coma Scale 15.  Cranial nerves II through XII are examined bilaterally and grossly normal.  Vision and hearing are grossly normal bilaterally.  The patient's motor strength is 5/5 in spinal bicep, tricep, handgrip, quadricep, gastrocnemius and dorsiflexors.  Cerebellar function is intact to rapid alternating  movements of the upper extremities bilaterally.  Sensory function is intact to light touch sensation all tested dermatomes bilaterally.  Imaging studies: I have reviewed the patient's thoracic CT performed 09/01/2018 to [facility].  He has a blastic lesion in the T6 pedicle, lamina and spinal canal with significant spinal cord compression.  There is also mild blastic lesions in the T5 vertebral body without significant neural compression.  Have also reviewed the patient's thoracic MRI performed yesterday: It demonstrates similar findings as above.  Assessment/Plan: T6 metastasis, paraparesis: I have discussed the situation with the patient.  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  We discussed the various treatment options including doing nothing, biopsy, radiation, etc.  With the degree of spinal cord compression and perceived weakness I think his best option is a T6 laminectomy with transpedicular spinal cord decompression and resection of the tumor with instrumentation and fusion from approximately T4-T8.  I have described the surgery to him.  We have discussed the risk of surgery including risk of anesthesia, hemorrhage, infection, spinal fluid leak, spinal cord injury/paralysis, instrumentation malplacement malfunction, medical risks, etc.  I have answered all his questions.  He wants to proceed with surgery.  Perhaps we can do this tomorrow afternoon.  We will need to transfer him to [facility] and start IV steroids.  Ophelia Charter 09/17/2020, 6:24 PM

## 2020-09-17 NOTE — ED Triage Notes (Signed)
Pt had an MRI of the spine yesterday, and was left multiple messages from Dr. Chryl Heck to come to he hospital for findings of cord compression. Per Dr. Rob Hickman note she wants him started on IV steroids and have a surgery consult placed. Pt has no complaints of pain at this time.

## 2020-09-17 NOTE — ED Provider Notes (Signed)
[facility] DEPT Provider Note   CSN: [id number] Arrival date & time: 09/17/20  1145     History Chief Complaint  Patient presents with   Abnormal MRI    Marcus [name] is a 75 y.o. male.  HPI   Pt has been having some trouble with weakness in his legs and hips for several weeks.  He has also been having pain in his back.  He was seen in the ED in August.  He had a CT scan that showed findings concerning for metastatic disease as well as possible epidural tumor.  He was discharged to follow up with his oncologist.  Pt had an MRI yesterday  Pt states he is still able to walk but can't walk too far without getting pain in his hips and he starts feeling weak.  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   Prostate cancer ([place]) 09/04/2013    Past Surgical History:  Procedure Laterality Date   LYMPHADENECTOMY Bilateral 09/04/2013   Procedure: PELVIC LYMPH NODE DISSECTION;  Surgeon: Alexis Frock, MD;  Location: [facility];  Service: Urology;  Laterality: Bilateral;   ROBOT ASSISTED LAPAROSCOPIC RADICAL PROSTATECTOMY N/A 09/04/2013   Procedure: ROBOTIC ASSISTED LAPAROSCOPIC RADICAL PROSTATECTOMY, INDOCYANINE GREEN DYE;  Surgeon: Alexis Frock, MD;  Location: [facility];  Service: Urology;  Laterality: N/A;   TOTAL HIP ARTHROPLASTY Right 05/15/2016   Procedure: RIGHT TOTAL HIP ARTHROPLASTY ANTERIOR APPROACH;  Surgeon: Paralee Cancel, MD;  Location: [facility];  Service: Orthopedics;  Laterality: Right;   WRIST GANGLION EXCISION          Family History  Problem Relation Age of Onset   Seizures Mother    Cancer Father        throat cancer    Social History   Tobacco Use   Smoking status: Every Day    Packs/day: 0.25    Types: Cigarettes   Smokeless tobacco: Never  Vaping Use   Vaping Use: Never used  Substance Use Topics   Alcohol use: Not Currently    Comment: 2- 40 ounce beers per week   Drug use: No    Home Medications Prior to Admission medications   Medication Sig Start Date End Date Taking? Authorizing Provider  acetaminophen (TYLENOL) 325 MG tablet Take 2 tablets (650 mg total) by mouth every 6 (six) hours as needed for up to 30 doses for mild pain or moderate pain. Patient not taking: Reported on 09/15/2020 08/31/20   Wyvonnia Dusky, MD  amLODipine (NORVASC) 10 MG tablet Take 1 tablet (10 mg total) by mouth daily. 09/17/18 11/16/18  Pokhrel, Corrie Mckusick, MD  atorvastatin (LIPITOR) 40 MG tablet Take 40 mg by mouth every evening. Patient not taking: Reported on 09/15/2020 04/18/16   [provider]  ferrous sulfate (FERROUSUL) 325 (65 FE) MG tablet Take 1 tablet (325 mg total) by mouth daily. Patient not taking: Reported on 09/15/2020 09/17/18   Flora Lipps, MD  ibuprofen (ADVIL) 600  MG tablet Take 1 tablet (600 mg total) by mouth every 6 (six) hours as needed for up to 30 doses. 08/31/20   Wyvonnia Dusky, MD    Allergies    Patient has no known allergies.  Review of Systems   Review of Systems  All other systems reviewed and are negative.  Physical Exam Updated Vital Signs BP (!) 164/70   Pulse 65   Temp 98.8 F (37.1 C) (Oral)   Resp 18   Ht 1.778 m ('5\' 10"'$ )   Wt 70.6 kg   SpO2 99%   BMI 22.34 kg/m   Physical Exam Vitals and nursing note reviewed.  Constitutional:      General: He is not in acute distress.    Appearance: He is well-developed.  HENT:     Head: Normocephalic and atraumatic.     Right Ear: External ear normal.     Left Ear: External ear normal.  Eyes:      General: No scleral icterus.       Right eye: No discharge.        Left eye: No discharge.     Conjunctiva/sclera: Conjunctivae normal.  Neck:     Trachea: No tracheal deviation.  Cardiovascular:     Rate and Rhythm: Normal rate and regular rhythm.  Pulmonary:     Effort: Pulmonary effort is normal. No respiratory distress.     Breath sounds: Normal breath sounds. No stridor. No wheezing or rales.  Abdominal:     General: Bowel sounds are normal. There is no distension.     Palpations: Abdomen is soft.     Tenderness: There is no abdominal tenderness. There is no guarding or rebound.  Musculoskeletal:        General: No tenderness or deformity.     Cervical back: Neck supple.  Skin:    General: Skin is warm and dry.     Findings: No rash.  Neurological:     General: No focal deficit present.     Mental Status: He is alert.     Cranial Nerves: No cranial nerve deficit (no facial droop, extraocular movements intact, no slurred speech).     Sensory: No sensory deficit.     Motor: No abnormal muscle tone or seizure activity.     Coordination: Coordination normal.     Comments: Normal sensation lower extremities, normal strength bilateral lower extremities, normal grip bilateral upper extremities  Psychiatric:        Mood and Affect: Mood normal.    ED Results / Procedures / Treatments   Labs (all labs ordered are listed, but only abnormal results are displayed) Labs Reviewed  COMPREHENSIVE METABOLIC PANEL - Abnormal; Notable for the following components:      Result Value   Potassium 3.3 (*)    All other components within normal limits  CBC WITH DIFFERENTIAL/PLATELET - Abnormal; Notable for the following components:   Hemoglobin 10.8 (*)    HCT 35.1 (*)    MCH 24.7 (*)    RDW 17.0 (*)    All other components within normal limits  RESP PANEL BY RT-PCR (FLU A&B, COVID) ARPGX2    EKG None  Radiology MR THORACIC SPINE W WO CONTRAST  Result Date: 09/16/2020 CLINICAL  DATA:  Cord compression. 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 Evaluate for bone metastases. EXAM: MRI THORACIC WITHOUT AND WITH CONTRAST TECHNIQUE: Multiplanar and multiecho pulse sequences of the thoracic spine were obtained without and with intravenous contrast. CONTRAST:  7.38m  GADAVIST GADOBUTROL 1 MMOL/ML IV SOLN COMPARISON:  CT of the thoracic spine 08/31/2020. FINDINGS: Alignment:  No significant spondylolisthesis. Vertebrae: Osseous lesions within the posterior T5 vertebral body and T5 posterior elements, as well as T6 vertebral body and posterior elements. Involvement of the T6 vertebral body and posterior elements is extensive. In addition to involvement of the right T6 transverse process, there is also involvement of the posterior right sixth rib. There is also involvement of the bilateral T7 pedicles/articular pillars, T7 spinous process, right T7 transverse process, and subtle involvement of the posterior T7 vertebral body. There is subtle heterogeneous enhancement of the T8 spinous process, which could reflect an additional lesion (series 23, image 8). Aditionally, there is partially ossified enhancing epidural tumor within the right posterolateral aspect of the spinal canal at the T5-T6 level. Resultant severe spinal canal stenosis with spinal cord compression. There may be subtle T2 hyperintense signal abnormality within the spinal cord at this level compatible with myelomalacia and/or focal edema (series 17, image 10). Additionally, tumor encroaches upon the right T5-T6 and T6-T7 neural foramina. Cord: Significant spinal cord compression at T5-T6 by tumor, as described above. Possible subtle T2/STIR hyperintense signal abnormality within the spinal cord at this level, which may reflect focal edema and/or myelomalacia (series 17, image 10). Paraspinal and other soft tissues: No abnormality identified within included portions of the thorax or upper abdomen/retroperitoneum.  Disc levels: No more than mild disc degeneration within the thoracic spine. No significant focal disc herniation or degenerative spinal canal stenosis. Mild facet arthrosis within the lower thoracic spine. No degenerative compressive foraminal stenosis. Impressions 1 and 2 will be called to the ordering clinician or representative by the Radiologist Assistant, and communication documented in the PACS or [employer]. IMPRESSION: Osseous lesions within the T5, T6, T7 and possibly T8 vertebra, as well as right 6th rib as detailed. These findings are highly suspicious for osseous metastatic disease given the patient's history of prostate cancer. 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 Subtle signal abnormality is questioned within the spinal cord at this level, and this may reflect focal edema and/or myelomalacia. Tumor also encroaches upon the right T5-T6 and T6-T7 neural foramina. Electronically Signed   By: Kellie Simmering D.O.   On: 09/16/2020 20:04    Procedures Procedures   Medications Ordered in ED Medications  dexamethasone (DECADRON) injection 10 mg (has no administration in time range)    ED Course  I have reviewed the triage vital signs and the nursing notes.  Pertinent labs & imaging results that were available during my care of the patient were reviewed by me and considered in my medical decision making (see chart for details).  Clinical Course as of 09/17/20 1755  Sat Sep 17, 2020  1621 Discussed case with Dr. Arnoldo Morale.  He will evaluate patient.  I will contact oncology [phone] D/w Dr Chryl Heck. She had not been able to contact pt directly.  Did speak with family and instructed pt to go to [facility].  Pt does not have a confirmed diagnosis at this point. [JK]    Clinical Course User Index [JK] Dorie Rank, MD   MDM  Rules/Calculators/A&P                           MRI results from yesterday reviewed.  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  Patient had severe spinal canal stenosis and significant spinal cord compression on the MRI.  Reviewed note from Dr. Chryl Heck.  She recommended IV Decadron and surgery consult.  I will contact neurosurgery for their recommendations.   Discussed case with Dr. Chryl Heck and Dr. Arnoldo Morale.  Plan on admission to the hospital.  Patient will likely need neurosurgical intervention.  Patient functionally still is asymptomatic.  He is able to stand up and get out of bed without difficulty.  He is able to walk to the bathroom on his own.  Appears to have good strength in his extremities.  Will keep NPO. Admits to eating a few potato chips prior to being seen.   Final Clinical Impression(s) / ED Diagnoses Final diagnoses:  Thoracic spine tumor    Rx / DC Orders ED Discharge Orders     None        Dorie Rank, MD 09/17/20 1755

## 2020-09-17 NOTE — ED Notes (Signed)
Report attempted to [facility]. Nurse to call back after they get report

## 2020-09-17 NOTE — ED Notes (Signed)
Report given to Raquel Sarna, RN at [facility]

## 2020-09-18 ENCOUNTER — Encounter (HOSPITAL_COMMUNITY): Payer: Self-pay | Admitting: Neurosurgery

## 2020-09-18 ENCOUNTER — Inpatient Hospital Stay (HOSPITAL_COMMUNITY): Payer: Medicare Other | Admitting: Critical Care Medicine

## 2020-09-18 ENCOUNTER — Encounter (HOSPITAL_COMMUNITY)
Admission: EM | Disposition: A | Discharge status: Home Health | Payer: Self-pay | Source: Home / Self Care | Attending: Neurosurgery

## 2020-09-18 ENCOUNTER — Inpatient Hospital Stay (HOSPITAL_COMMUNITY): Payer: Medicare Other

## 2020-09-18 HISTORY — PX: LAMINECTOMY: SHX219

## 2020-09-18 LAB — SURGICAL PCR SCREEN
MRSA, PCR: NEGATIVE
Staphylococcus aureus: NEGATIVE

## 2020-09-18 SURGERY — THORACIC LAMINECTOMY FOR TUMOR
Anesthesia: General | Site: Back

## 2020-09-18 MED ORDER — 0.9 % SODIUM CHLORIDE (POUR BTL) OPTIME
TOPICAL | Status: DC | PRN
  Administered 2020-09-18: 1000 mL

## 2020-09-18 MED ORDER — LACTATED RINGERS IV SOLN: INTRAVENOUS | Status: DC

## 2020-09-18 MED ORDER — MUPIROCIN 2 % EX OINT: 1.0000 "application " | TOPICAL_OINTMENT | Freq: Two times a day (BID) | CUTANEOUS | Status: DC

## 2020-09-18 MED ORDER — CEFAZOLIN SODIUM-DEXTROSE 2-3 GM-%(50ML) IV SOLR
INTRAVENOUS | Status: DC | PRN
  Administered 2020-09-18: 2 g via INTRAVENOUS

## 2020-09-18 MED ORDER — HYDROMORPHONE HCL 1 MG/ML IJ SOLN
0.2500 mg | INTRAMUSCULAR | Status: DC | PRN
  Administered 2020-09-18 (×4): 0.25 mg via INTRAVENOUS

## 2020-09-18 MED ORDER — PHENOL 1.4 % MT LIQD: 1.0000 | OROMUCOSAL | Status: DC | PRN

## 2020-09-18 MED ORDER — ACETAMINOPHEN 500 MG PO TABS
1000.0000 mg | ORAL_TABLET | Freq: Four times a day (QID) | ORAL | Status: AC
  Administered 2020-09-19 (×3): 1000 mg via ORAL
  Filled 2020-09-18 (×3): qty 2

## 2020-09-18 MED ORDER — CHLORHEXIDINE GLUCONATE 0.12 % MT SOLN
15.0000 mL | Freq: Once | OROMUCOSAL | Status: AC
  Administered 2020-09-18: 15 mL via OROMUCOSAL
  Filled 2020-09-18: qty 15

## 2020-09-18 MED ORDER — BACITRACIN ZINC 500 UNIT/GM EX OINT
TOPICAL_OINTMENT | CUTANEOUS | Status: AC
  Filled 2020-09-18: qty 28.35

## 2020-09-18 MED ORDER — SODIUM CHLORIDE 0.9% FLUSH
3.0000 mL | Freq: Two times a day (BID) | INTRAVENOUS | Status: DC
  Administered 2020-09-18 – 2020-09-22 (×8): 3 mL via INTRAVENOUS

## 2020-09-18 MED ORDER — MENTHOL 3 MG MT LOZG: 1.0000 | LOZENGE | OROMUCOSAL | Status: DC | PRN

## 2020-09-18 MED ORDER — DOCUSATE SODIUM 100 MG PO CAPS
100.0000 mg | ORAL_CAPSULE | Freq: Two times a day (BID) | ORAL | Status: DC
  Administered 2020-09-18 – 2020-09-22 (×7): 100 mg via ORAL
  Filled 2020-09-18 (×8): qty 1

## 2020-09-18 MED ORDER — ONDANSETRON HCL 4 MG/2ML IJ SOLN
4.0000 mg | Freq: Four times a day (QID) | INTRAMUSCULAR | Status: DC | PRN
Start: 2020-09-18 — End: 2020-09-18

## 2020-09-18 MED ORDER — ORAL CARE MOUTH RINSE: 15.0000 mL | Freq: Once | OROMUCOSAL | Status: AC

## 2020-09-18 MED ORDER — FENTANYL CITRATE (PF) 250 MCG/5ML IJ SOLN
INTRAMUSCULAR | Status: DC | PRN
  Administered 2020-09-18 (×2): 25 ug via INTRAVENOUS
  Administered 2020-09-18: 150 ug via INTRAVENOUS
  Administered 2020-09-18 (×5): 50 ug via INTRAVENOUS

## 2020-09-18 MED ORDER — PHENYLEPHRINE HCL-NACL 20-0.9 MG/250ML-% IV SOLN
INTRAVENOUS | Status: DC | PRN
  Administered 2020-09-18: 25 ug/min via INTRAVENOUS

## 2020-09-18 MED ORDER — HYDROMORPHONE HCL 1 MG/ML IJ SOLN
INTRAMUSCULAR | Status: AC
  Filled 2020-09-18: qty 1

## 2020-09-18 MED ORDER — ACETAMINOPHEN 650 MG RE SUPP: 650.0000 mg | RECTAL | Status: DC | PRN

## 2020-09-18 MED ORDER — THROMBIN 5000 UNITS EX SOLR
OROMUCOSAL | Status: DC | PRN
  Administered 2020-09-18: 5 mL via TOPICAL

## 2020-09-18 MED ORDER — LIDOCAINE 2% (20 MG/ML) 5 ML SYRINGE
INTRAMUSCULAR | Status: DC | PRN
  Administered 2020-09-18: 60 mg via INTRAVENOUS

## 2020-09-18 MED ORDER — THROMBIN 20000 UNITS EX SOLR
CUTANEOUS | Status: DC | PRN
  Administered 2020-09-18: 20 mL via TOPICAL

## 2020-09-18 MED ORDER — ROCURONIUM BROMIDE 10 MG/ML (PF) SYRINGE
PREFILLED_SYRINGE | INTRAVENOUS | Status: DC | PRN
Start: 2020-09-18 — End: 2020-09-18
  Administered 2020-09-18: 20 mg via INTRAVENOUS
  Administered 2020-09-18: 60 mg via INTRAVENOUS
  Administered 2020-09-18: 20 mg via INTRAVENOUS
  Administered 2020-09-18: 10 mg via INTRAVENOUS

## 2020-09-18 MED ORDER — SUGAMMADEX SODIUM 200 MG/2ML IV SOLN
INTRAVENOUS | Status: DC | PRN
  Administered 2020-09-18: 200 mg via INTRAVENOUS

## 2020-09-18 MED ORDER — THROMBIN 5000 UNITS EX SOLR
CUTANEOUS | Status: AC
  Filled 2020-09-18: qty 5000

## 2020-09-18 MED ORDER — FENTANYL CITRATE (PF) 250 MCG/5ML IJ SOLN
INTRAMUSCULAR | Status: AC
  Filled 2020-09-18: qty 5

## 2020-09-18 MED ORDER — BUPIVACAINE-EPINEPHRINE 0.5% -1:200000 IJ SOLN
INTRAMUSCULAR | Status: DC | PRN
  Administered 2020-09-18: 10 mL

## 2020-09-18 MED ORDER — MORPHINE SULFATE (PF) 4 MG/ML IV SOLN
4.0000 mg | INTRAVENOUS | Status: DC | PRN
  Administered 2020-09-18: 4 mg via INTRAVENOUS
  Filled 2020-09-18: qty 1

## 2020-09-18 MED ORDER — CEFAZOLIN SODIUM-DEXTROSE 2-4 GM/100ML-% IV SOLN
2.0000 g | Freq: Three times a day (TID) | INTRAVENOUS | Status: AC
  Administered 2020-09-18 – 2020-09-19 (×2): 2 g via INTRAVENOUS
  Filled 2020-09-18 (×2): qty 100

## 2020-09-18 MED ORDER — BISACODYL 10 MG RE SUPP: 10.0000 mg | Freq: Every day | RECTAL | Status: DC | PRN

## 2020-09-18 MED ORDER — ESMOLOL HCL 100 MG/10ML IV SOLN
INTRAVENOUS | Status: DC | PRN
  Administered 2020-09-18: 20 mg via INTRAVENOUS

## 2020-09-18 MED ORDER — OXYCODONE HCL 5 MG PO TABS: 5.0000 mg | ORAL_TABLET | Freq: Once | ORAL | Status: DC | PRN

## 2020-09-18 MED ORDER — ACETAMINOPHEN 325 MG PO TABS
650.0000 mg | ORAL_TABLET | ORAL | Status: DC | PRN
  Administered 2020-09-18: 650 mg via ORAL
  Filled 2020-09-18: qty 2

## 2020-09-18 MED ORDER — PROPOFOL 10 MG/ML IV BOLUS
INTRAVENOUS | Status: AC
  Filled 2020-09-18: qty 20

## 2020-09-18 MED ORDER — OXYCODONE HCL 5 MG PO TABS
10.0000 mg | ORAL_TABLET | ORAL | Status: DC | PRN
  Administered 2020-09-18 – 2020-09-22 (×7): 10 mg via ORAL
  Filled 2020-09-18 (×8): qty 2

## 2020-09-18 MED ORDER — ONDANSETRON HCL 4 MG PO TABS: 4.0000 mg | ORAL_TABLET | Freq: Four times a day (QID) | ORAL | Status: DC | PRN

## 2020-09-18 MED ORDER — CYCLOBENZAPRINE HCL 10 MG PO TABS
10.0000 mg | ORAL_TABLET | Freq: Three times a day (TID) | ORAL | Status: DC | PRN
  Administered 2020-09-18 – 2020-09-21 (×3): 10 mg via ORAL
  Filled 2020-09-18 (×3): qty 1

## 2020-09-18 MED ORDER — BUPIVACAINE-EPINEPHRINE 0.5% -1:200000 IJ SOLN
INTRAMUSCULAR | Status: AC
  Filled 2020-09-18: qty 1

## 2020-09-18 MED ORDER — SODIUM CHLORIDE 0.9% FLUSH
3.0000 mL | INTRAVENOUS | Status: DC | PRN
  Administered 2020-09-20: 3 mL via INTRAVENOUS

## 2020-09-18 MED ORDER — ONDANSETRON HCL 4 MG/2ML IJ SOLN
4.0000 mg | Freq: Four times a day (QID) | INTRAMUSCULAR | Status: DC | PRN
Start: 2020-09-18 — End: 2020-09-22

## 2020-09-18 MED ORDER — OXYCODONE HCL 5 MG/5ML PO SOLN
5.0000 mg | Freq: Once | ORAL | Status: DC | PRN
Start: 2020-09-18 — End: 2020-09-18

## 2020-09-18 MED ORDER — PROPOFOL 10 MG/ML IV BOLUS
INTRAVENOUS | Status: DC | PRN
  Administered 2020-09-18: 50 mg via INTRAVENOUS
  Administered 2020-09-18: 150 mg via INTRAVENOUS

## 2020-09-18 MED ORDER — THROMBIN 20000 UNITS EX SOLR
CUTANEOUS | Status: AC
  Filled 2020-09-18: qty 20000

## 2020-09-18 MED ORDER — BACITRACIN ZINC 500 UNIT/GM EX OINT
TOPICAL_OINTMENT | CUTANEOUS | Status: DC | PRN
  Administered 2020-09-18: 1 via TOPICAL

## 2020-09-18 MED ORDER — OXYCODONE HCL 5 MG PO TABS
5.0000 mg | ORAL_TABLET | ORAL | Status: DC | PRN
  Administered 2020-09-21: 5 mg via ORAL
  Filled 2020-09-18: qty 1

## 2020-09-18 MED ORDER — SODIUM CHLORIDE 0.9 % IV SOLN
250.0000 mL | INTRAVENOUS | Status: DC
  Administered 2020-09-19: 250 mL via INTRAVENOUS

## 2020-09-18 MED ORDER — DEXAMETHASONE SODIUM PHOSPHATE 10 MG/ML IJ SOLN
INTRAMUSCULAR | Status: DC | PRN
  Administered 2020-09-18: 10 mg via INTRAVENOUS

## 2020-09-18 MED ORDER — ONDANSETRON HCL 4 MG/2ML IJ SOLN
INTRAMUSCULAR | Status: DC | PRN
Start: 2020-09-18 — End: 2020-09-18
  Administered 2020-09-18: 4 mg via INTRAVENOUS

## 2020-09-18 SURGICAL SUPPLY — 95 items
APL SKNCLS STERI-STRIP NONHPOA (GAUZE/BANDAGES/DRESSINGS) ×1
BAG COUNTER SPONGE SURGICOUNT (BAG) ×2 IMPLANT
BAG SPNG CNTER NS LX DISP (BAG) ×1
BALL CTTN LRG ABS STRL LF (GAUZE/BANDAGES/DRESSINGS)
BAND INSRT 18 STRL LF DISP RB (MISCELLANEOUS)
BAND RUBBER #18 3X1/16 STRL (MISCELLANEOUS) IMPLANT
BENZOIN TINCTURE PRP APPL 2/3 (GAUZE/BANDAGES/DRESSINGS) ×2 IMPLANT
BIT DRILL NEURO 2X3.1 SFT TUCH (MISCELLANEOUS) ×1 IMPLANT
BLADE CLIPPER SURG (BLADE) IMPLANT
BLADE SURG 11 STRL SS (BLADE) IMPLANT
BLADE ULTRA TIP 2M (BLADE) IMPLANT
BUR MATCHSTICK NEURO 3.0 LAGG (BURR) ×2 IMPLANT
BUR PRECISION FLUTE 6.0 (BURR) IMPLANT
CANISTER SUCT 3000ML PPV (MISCELLANEOUS) ×2 IMPLANT
CAP LOCK DLX THRD (Cap) ×8 IMPLANT
CARTRIDGE OIL MAESTRO DRILL (MISCELLANEOUS) ×1 IMPLANT
CLIP VESOCCLUDE MED 6/CT (CLIP) IMPLANT
CLSR STERI-STRIP ANTIMIC 1/2X4 (GAUZE/BANDAGES/DRESSINGS) ×1 IMPLANT
CONN CROSS 6.35X29-33 (Connector) ×2 IMPLANT
CONNECTOR CROSS 6.35X29-33 (Connector) IMPLANT
COTTONBALL LRG STERILE PKG (GAUZE/BANDAGES/DRESSINGS) IMPLANT
COVER MAYO STAND STRL (DRAPES) IMPLANT
DIFFUSER DRILL AIR PNEUMATIC (MISCELLANEOUS) ×2 IMPLANT
DRAPE C-ARM 42X72 X-RAY (DRAPES) ×4 IMPLANT
DRAPE C-ARMOR (DRAPES) ×1 IMPLANT
DRAPE CAMERA VIDEO/LASER (DRAPES) IMPLANT
DRAPE LAPAROTOMY 100X72 PEDS (DRAPES) ×2 IMPLANT
DRAPE LAPAROTOMY 100X72X124 (DRAPES) IMPLANT
DRAPE MICROSCOPE LEICA (MISCELLANEOUS) IMPLANT
DRAPE SURG 17X23 STRL (DRAPES) ×8 IMPLANT
DRILL NEURO 2X3.1 SOFT TOUCH (MISCELLANEOUS) ×2
DRSG OPSITE POSTOP 4X10 (GAUZE/BANDAGES/DRESSINGS) ×1 IMPLANT
ELECT BLADE 4.0 EZ CLEAN MEGAD (MISCELLANEOUS) ×2
ELECT REM PT RETURN 9FT ADLT (ELECTROSURGICAL) ×2
ELECTRODE BLDE 4.0 EZ CLN MEGD (MISCELLANEOUS) ×1 IMPLANT
ELECTRODE REM PT RTRN 9FT ADLT (ELECTROSURGICAL) ×1 IMPLANT
EVACUATOR SILICONE 100CC (DRAIN) IMPLANT
GAUZE 4X4 16PLY ~~LOC~~+RFID DBL (SPONGE) IMPLANT
GAUZE SPONGE 4X4 12PLY STRL (GAUZE/BANDAGES/DRESSINGS) ×2 IMPLANT
GLOVE EXAM NITRILE XL STR (GLOVE) IMPLANT
GLOVE SURG ENC MOIS LTX SZ8 (GLOVE) ×2 IMPLANT
GLOVE SURG ENC MOIS LTX SZ8.5 (GLOVE) ×2 IMPLANT
GLOVE SURG LTX SZ7 (GLOVE) ×4 IMPLANT
GLOVE SURG LTX SZ8 (GLOVE) ×1 IMPLANT
GLOVE SURG MICRO LTX SZ8 (GLOVE) ×1 IMPLANT
GLOVE SURG UNDER POLY LF SZ7.5 (GLOVE) ×2 IMPLANT
GOWN STRL REUS W/ TWL LRG LVL3 (GOWN DISPOSABLE) IMPLANT
GOWN STRL REUS W/ TWL XL LVL3 (GOWN DISPOSABLE) ×1 IMPLANT
GOWN STRL REUS W/TWL 2XL LVL3 (GOWN DISPOSABLE) IMPLANT
GOWN STRL REUS W/TWL LRG LVL3 (GOWN DISPOSABLE)
GOWN STRL REUS W/TWL XL LVL3 (GOWN DISPOSABLE) ×2
HEMOSTAT POWDER KIT SURGIFOAM (HEMOSTASIS) ×1 IMPLANT
KIT BASIN OR (CUSTOM PROCEDURE TRAY) ×2 IMPLANT
KIT TURNOVER KIT B (KITS) ×2 IMPLANT
MARKER SKIN DUAL TIP RULER LAB (MISCELLANEOUS) ×1 IMPLANT
NDL HYPO 21X1.5 SAFETY (NEEDLE) IMPLANT
NDL SPNL 18GX3.5 QUINCKE PK (NEEDLE) ×1 IMPLANT
NEEDLE HYPO 21X1.5 SAFETY (NEEDLE) IMPLANT
NEEDLE HYPO 22GX1.5 SAFETY (NEEDLE) ×2 IMPLANT
NEEDLE SPNL 18GX3.5 QUINCKE PK (NEEDLE) ×2 IMPLANT
NS IRRIG 1000ML POUR BTL (IV SOLUTION) ×2 IMPLANT
OIL CARTRIDGE MAESTRO DRILL (MISCELLANEOUS) ×2
PACK LAMINECTOMY NEURO (CUSTOM PROCEDURE TRAY) ×2 IMPLANT
PAD ARMBOARD 7.5X6 YLW CONV (MISCELLANEOUS) ×6 IMPLANT
PATTIES SURGICAL .25X.25 (GAUZE/BANDAGES/DRESSINGS) IMPLANT
PATTIES SURGICAL .5 X.5 (GAUZE/BANDAGES/DRESSINGS) IMPLANT
PATTIES SURGICAL .5 X3 (DISPOSABLE) IMPLANT
PATTIES SURGICAL 1/4 X 3 (GAUZE/BANDAGES/DRESSINGS) IMPLANT
PATTIES SURGICAL 1X1 (DISPOSABLE) IMPLANT
PIN MAYFIELD SKULL DISP (PIN) ×2 IMPLANT
PUTTY DBM 10CC CALC GRAN (Putty) ×1 IMPLANT
ROD CURVED TI 6.35X125 (Rod) ×2 IMPLANT
SCREW PA CREO DLX 6.5X45 (Screw) ×4 IMPLANT
SCREW PA DLX CREO 5.5X40 (Screw) ×4 IMPLANT
SPONGE NEURO XRAY DETECT 1X3 (DISPOSABLE) IMPLANT
SPONGE SURGIFOAM ABS GEL 100 (HEMOSTASIS) ×2 IMPLANT
SPONGE T-LAP 4X18 ~~LOC~~+RFID (SPONGE) IMPLANT
STAPLER SKIN PROX WIDE 3.9 (STAPLE) ×2 IMPLANT
STRIP CLOSURE SKIN 1/2X4 (GAUZE/BANDAGES/DRESSINGS) ×2 IMPLANT
SUT ETHILON 2 0 FS 18 (SUTURE) IMPLANT
SUT ETHILON 3 0 FSL (SUTURE) ×2 IMPLANT
SUT NURALON 4 0 TR CR/8 (SUTURE) IMPLANT
SUT PROLENE 6 0 BV (SUTURE) IMPLANT
SUT SILK 3 0 TIES 17X18 (SUTURE)
SUT SILK 3-0 18XBRD TIE BLK (SUTURE) IMPLANT
SUT VIC AB 0 CT1 18XCR BRD8 (SUTURE) ×1 IMPLANT
SUT VIC AB 0 CT1 8-18 (SUTURE) ×2
SUT VIC AB 1 CT1 18XBRD ANBCTR (SUTURE) ×1 IMPLANT
SUT VIC AB 1 CT1 8-18 (SUTURE) ×2
SUT VIC AB 2-0 CP2 18 (SUTURE) ×2 IMPLANT
TOWEL GREEN STERILE (TOWEL DISPOSABLE) ×2 IMPLANT
TOWEL GREEN STERILE FF (TOWEL DISPOSABLE) ×2 IMPLANT
TRAY FOLEY MTR SLVR 16FR STAT (SET/KITS/TRAYS/PACK) IMPLANT
UNDERPAD 30X36 HEAVY ABSORB (UNDERPADS AND DIAPERS) IMPLANT
WATER STERILE IRR 1000ML POUR (IV SOLUTION) ×2 IMPLANT

## 2020-09-18 NOTE — Progress Notes (Signed)
Subjective: The patient is somnolent but arousable.    Objective: Vital signs in last 24 hours: Temp:  [97.6 F (36.4 C)-99.2 F (37.3 C)] 98.3 F (36.8 C) (09/04 1342) Pulse Rate:  [66-88] 80 (09/04 1342) Resp:  [18] 18 (09/04 1342) BP: (145-196)/(64-82) 145/68 (09/04 1322) SpO2:  [98 %-100 %] 98 % (09/04 1342) Weight:  [70.6 kg] 70.6 kg (09/04 1342) Estimated body mass index is 22.34 kg/m as calculated from the following:   Height as of this encounter: '5\' 10"'$  (1.778 m).   Weight as of this encounter: 70.6 kg.   Intake/Output from previous day: No intake/output data recorded. Intake/Output this shift: Total I/O In: -  Out: 390 [Urine:140; Blood:250]  Physical exam the patient is somnolent but arousable.  He is moving his lower extremities well.  Lab Results: Recent Labs    09/17/20 1315  WBC 6.0  HGB 10.8*  HCT 35.1*  PLT 293   BMET Recent Labs    09/17/20 1315  NA 141  K 3.3*  CL 107  CO2 24  GLUCOSE 89  BUN 12  CREATININE 0.95  CALCIUM 9.4    Studies/Results: MR THORACIC SPINE W WO CONTRAST  Result Date: 09/16/2020 CLINICAL DATA:  Cord compression. 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 Evaluate for bone metastases. EXAM: MRI THORACIC WITHOUT AND WITH CONTRAST TECHNIQUE: Multiplanar and multiecho pulse sequences of the thoracic spine were obtained without and with intravenous contrast. CONTRAST:  7.64m GADAVIST GADOBUTROL 1 MMOL/ML IV SOLN COMPARISON:  CT of the thoracic spine 08/31/2020. FINDINGS: Alignment:  No significant spondylolisthesis. Vertebrae: Osseous lesions within the posterior T5 vertebral body and T5 posterior elements, as well as T6 vertebral body and posterior elements. Involvement of the T6 vertebral body and posterior elements is extensive. In addition to involvement of the right T6 transverse process, there is also involvement of the posterior right sixth rib. There is also involvement of the bilateral T7  pedicles/articular pillars, T7 spinous process, right T7 transverse process, and subtle involvement of the posterior T7 vertebral body. There is subtle heterogeneous enhancement of the T8 spinous process, which could reflect an additional lesion (series 23, image 8). Aditionally, there is partially ossified enhancing epidural tumor within the right posterolateral aspect of the spinal canal at the T5-T6 level. Resultant severe spinal canal stenosis with spinal cord compression. There may be subtle T2 hyperintense signal abnormality within the spinal cord at this level compatible with myelomalacia and/or focal edema (series 17, image 10). Additionally, tumor encroaches upon the right T5-T6 and T6-T7 neural foramina. Cord: Significant spinal cord compression at T5-T6 by tumor, as described above. Possible subtle T2/STIR hyperintense signal abnormality within the spinal cord at this level, which may reflect focal edema and/or myelomalacia (series 17, image 10). Paraspinal and other soft tissues: No abnormality identified within included portions of the thorax or upper abdomen/retroperitoneum. Disc levels: No more than mild disc degeneration within the thoracic spine. No significant focal disc herniation or degenerative spinal canal stenosis. Mild facet arthrosis within the lower thoracic spine. No degenerative compressive foraminal stenosis. Impressions 1 and 2 will be called to the ordering clinician or representative by the Radiologist Assistant, and communication documented in the PACS or [employer] IMPRESSION: Osseous lesions within the T5, T6, T7 and possibly T8 vertebra, as well as right 6th rib as detailed. These findings are highly suspicious for osseous metastatic disease given the patient's history of prostate cancer. Additionally, there is partially ossified epidural tumor within the right dorsal aspect of the  spinal canal at T5-T6, with resultant severe spinal canal stenosis and significant spinal  cord compression. Subtle signal abnormality is questioned within the spinal cord at this level, and this may reflect focal edema and/or myelomalacia. Tumor also encroaches upon the right T5-T6 and T6-T7 neural foramina. Electronically Signed   By: Kellie Simmering D.O.   On: 09/16/2020 20:04   DG C-Arm 1-60 Min-No Report  Result Date: 09/18/2020 Fluoroscopy was utilized by the requesting physician.  No radiographic interpretation.    Assessment/Plan: The patient is doing well.  I attempted to call his daughter but the number is incorrect.  LOS: 1 day     Marcus [name] 09/18/2020, 5:54 PM

## 2020-09-18 NOTE — Op Note (Signed)
Brief history: The patient is a 75 year old black male with a history of prostate cancer who presented with back pain and paraparesis.  He was worked up with a thoracic CT and a thoracic MRI which demonstrated a T6 bony tumor with severe spinal cord compression.  I discussed the various treatment options with him.  He has decided to proceed with surgery.  Preoperative diagnosis: Thoracic spine tumor, paraparesis, thoracic spine pain   Postoperative diagnosis: The same  Procedure: T5 and T6 laminectomy for resection of extradural tumor; posterior segmental instrumentation from T4 to T8 with globus titanium pedicle screws and rods; posterior lateral arthrodesis at T4-5, T5-6, T6-7, T7-8 with local morselized autograft bone and Zimmer DBM.  Surgeon: Dr. Earle Gell  Asst.: Dr. Pieter Partridge Dawley  Anesthesia: Gen. endotracheal  Estimated blood loss: 250 cc  Drains: 1 medium Hemovac in the epidural space  Complications: None  Description of procedure: The patient was brought to the operating room by the anesthesia team. General endotracheal anesthesia was induced. The patient was turned to the prone position on the Maybee table. The patient's lumbosacral region was then prepared with Betadine scrub and Betadine solution. Sterile drapes were applied.  I then injected the area to be incised with Marcaine with epinephrine solution. I then used the scalpel to make a linear midline incision over the T4-T8 vertebral bodies . I then used electrocautery to perform a bilateral subperiosteal dissection exposing the spinous process and lamina of T4-T8.  We used intraoperative fluoroscopy to confirm our location.  We then inserted the Verstrac retractor to provide exposure.  I began the decompression by using the high speed drill to perform laminotomies at T5-6 and T6-7. We then used the Kerrison punches to widen the complete the T5 and T6 laminectomy and removed the ligamentum flavum at T4-5, T5-6 and T6-7.  We  encountered bony and epidural tumor at T6 on the right as expected.  We remove the bony tumor using the Kerrison punches decompressing the thecal sac/spinal cord.  We sent specimens to the pathologist.  We now turned attention to the instrumentation. Under fluoroscopic guidance we cannulated the bilateral T4, T5, T7 and T8 pedicles with the bone probe. We then removed the bone probe. We then tapped the pedicle with a 4.5 and 5.5 millimeter tap. We then removed the tap. We probed inside the tapped pedicle with a ball probe to rule out cortical breaches. We then inserted a 5.5 x 40 and 6.5 x 45 millimeter pedicle screw into the T4, T5, T7 and T8 pedicles bilaterally under fluoroscopic guidance.  We then tightened the caps appropriately.  We placed a cross connector between the rods.  This completed the instrumentation from T4-T8 bilaterally.  We now turned our attention to the posterior lateral arthrodesis at T4-5, T5-6, T6-7, and T7-T8. We used the high-speed drill to decorticate the remainder of the lamina and facets at T4-5, T5-6, T6-7, and T7-8 we then applied a combination of local morselized autograft bone and Zimmer DBM over these decorticated posterior lateral structures. This completed the posterior lateral arthrodesis at T4-5, T5-6, T6-7 and T7-T8  We then obtained hemostasis using bipolar electrocautery. We irrigated the wound out with bacitracin solution. We inspected the thecal sac it was well decompressed we then removed the retractor.  We placed a medium Hemovac drain in the epidural space and tunneled it out through a separate stab wound.. We reapproximated patient's thoracolumbar fascia with interrupted #1 Vicryl suture. We reapproximated patient's subcutaneous tissue with interrupted 2-0 Vicryl  suture. The reapproximated patient's skin with Steri-Strips and benzoin. The wound was then coated with bacitracin ointment. A sterile dressing was applied. The drapes were removed. The patient was  subsequently returned to the supine position where they were extubated by the anesthesia team. He was then transported to the post anesthesia care unit in stable condition. All sponge instrument and needle counts were reportedly correct at the end of this case.

## 2020-09-18 NOTE — Anesthesia Procedure Notes (Signed)
Arterial Line Insertion Start/End9/04/2020 1:50 PM, 09/18/2020 1:55 PM Performed by: Wilburn Cornelia, CRNA, CRNA  Patient location: Pre-op. Preanesthetic checklist: patient identified, IV checked, site marked, risks and benefits discussed, surgical consent, monitors and equipment checked, pre-op evaluation and timeout performed Lidocaine 1% used for infiltration Left, radial was placed Catheter size: 20 G Hand hygiene performed  and maximum sterile barriers used  Allen's test indicative of satisfactory collateral circulation Attempts: 2 Procedure performed without using ultrasound guided technique. Following insertion, dressing applied and Biopatch. Post procedure assessment: normal

## 2020-09-18 NOTE — Progress Notes (Signed)
Subjective: The patient is alert and pleasant.  Objective: Vital signs in last 24 hours: Temp:  [97.6 F (36.4 C)-99.2 F (37.3 C)] 98.3 F (36.8 C) (09/04 1342) Pulse Rate:  [63-88] 80 (09/04 1342) Resp:  [16-18] 18 (09/04 1342) BP: (145-196)/(64-82) 145/68 (09/04 1322) SpO2:  [97 %-100 %] 98 % (09/04 1342) Weight:  [70.6 kg] 70.6 kg (09/04 1342) Estimated body mass index is 22.34 kg/m as calculated from the following:   Height as of this encounter: '5\' 10"'$  (1.778 m).   Weight as of this encounter: 70.6 kg.   Intake/Output from previous day: No intake/output data recorded. Intake/Output this shift: No intake/output data recorded.  Physical exam patient is alert and oriented.  His lower extremity strength is fairly normal.  Lab Results: Recent Labs    09/15/20 1415 09/17/20 1315  WBC 5.9 6.0  HGB 11.0* 10.8*  HCT 33.3* 35.1*  PLT 291 293   BMET Recent Labs    09/15/20 1415 09/17/20 1315  NA 139 141  K 3.4* 3.3*  CL 102 107  CO2 23 24  GLUCOSE 104* 89  BUN 11 12  CREATININE 1.01 0.95  CALCIUM 9.6 9.4    Studies/Results: MR THORACIC SPINE W WO CONTRAST  Result Date: 09/16/2020 CLINICAL DATA:  Cord compression. 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 Evaluate for bone metastases. EXAM: MRI THORACIC WITHOUT AND WITH CONTRAST TECHNIQUE: Multiplanar and multiecho pulse sequences of the thoracic spine were obtained without and with intravenous contrast. CONTRAST:  7.76m GADAVIST GADOBUTROL 1 MMOL/ML IV SOLN COMPARISON:  CT of the thoracic spine 08/31/2020. FINDINGS: Alignment:  No significant spondylolisthesis. Vertebrae: Osseous lesions within the posterior T5 vertebral body and T5 posterior elements, as well as T6 vertebral body and posterior elements. Involvement of the T6 vertebral body and posterior elements is extensive. In addition to involvement of the right T6 transverse process, there is also involvement of the posterior right sixth  rib. There is also involvement of the bilateral T7 pedicles/articular pillars, T7 spinous process, right T7 transverse process, and subtle involvement of the posterior T7 vertebral body. There is subtle heterogeneous enhancement of the T8 spinous process, which could reflect an additional lesion (series 23, image 8). Aditionally, there is partially ossified enhancing epidural tumor within the right posterolateral aspect of the spinal canal at the T5-T6 level. Resultant severe spinal canal stenosis with spinal cord compression. There may be subtle T2 hyperintense signal abnormality within the spinal cord at this level compatible with myelomalacia and/or focal edema (series 17, image 10). Additionally, tumor encroaches upon the right T5-T6 and T6-T7 neural foramina. Cord: Significant spinal cord compression at T5-T6 by tumor, as described above. Possible subtle T2/STIR hyperintense signal abnormality within the spinal cord at this level, which may reflect focal edema and/or myelomalacia (series 17, image 10). Paraspinal and other soft tissues: No abnormality identified within included portions of the thorax or upper abdomen/retroperitoneum. Disc levels: No more than mild disc degeneration within the thoracic spine. No significant focal disc herniation or degenerative spinal canal stenosis. Mild facet arthrosis within the lower thoracic spine. No degenerative compressive foraminal stenosis. Impressions 1 and 2 will be called to the ordering clinician or representative by the Radiologist Assistant, and communication documented in the PACS or [employer] IMPRESSION: Osseous lesions within the T5, T6, T7 and possibly T8 vertebra, as well as right 6th rib as detailed. These findings are highly suspicious for osseous metastatic disease given the patient's history of prostate cancer. Additionally, there is partially ossified epidural  tumor within the right dorsal aspect of the spinal canal at T5-T6, with resultant  severe spinal canal stenosis and significant spinal cord compression. Subtle signal abnormality is questioned within the spinal cord at this level, and this may reflect focal edema and/or myelomalacia. Tumor also encroaches upon the right T5-T6 and T6-T7 neural foramina. Electronically Signed   By: Kellie Simmering D.O.   On: 09/16/2020 20:04    Assessment/Plan: T6 tumor, paraparesis: I have discussed the situation again with the patient.  I have answered all his questions regarding surgery.  He wants to proceed with a T6 laminectomy and transpedicular decompression with instrumentation and fusion.  LOS: 1 day     Marcus [name] 09/18/2020, 2:08 PM

## 2020-09-18 NOTE — Anesthesia Preprocedure Evaluation (Signed)
Anesthesia Evaluation  Patient identified by MRN, date of birth, ID band Patient awake    Reviewed: Allergy & Precautions, H&P , NPO status , Patient's Chart, lab work & pertinent test results  Airway Mallampati: II   Neck ROM: full    Dental   Pulmonary Current Smoker,    breath sounds clear to auscultation       Cardiovascular hypertension,  Rhythm:regular Rate:Normal     Neuro/Psych PSYCHIATRIC DISORDERS Depression Numbness and tingling in right arm  Neuromuscular disease    GI/Hepatic   Endo/Other    Renal/GU    H/o prostate CA s/p prostatectomy    Musculoskeletal  (+) Arthritis , Thoracic spine tumor   Abdominal   Peds  Hematology   Anesthesia Other Findings   Reproductive/Obstetrics                             Anesthesia Physical Anesthesia Plan  ASA: 3  Anesthesia Plan: General   Post-op Pain Management:    Induction: Intravenous  PONV Risk Score and Plan: 1 and Ondansetron, Dexamethasone, Midazolam and Treatment may vary due to age or medical condition  Airway Management Planned: Oral ETT  Additional Equipment: Arterial line  Intra-op Plan:   Post-operative Plan: Extubation in OR  Informed Consent: I have reviewed the patients History and Physical, chart, labs and discussed the procedure including the risks, benefits and alternatives for the proposed anesthesia with the patient or authorized representative who has indicated his/her understanding and acceptance.     Dental advisory given  Plan Discussed with: CRNA, Anesthesiologist and Surgeon  Anesthesia Plan Comments:         Anesthesia Quick Evaluation

## 2020-09-18 NOTE — Anesthesia Procedure Notes (Signed)
Procedure Name: Intubation Date/Time: 09/18/2020 2:19 PM Performed by: Wilburn Cornelia, CRNA Pre-anesthesia Checklist: Patient identified, Emergency Drugs available, Suction available, Patient being monitored and Timeout performed Patient Re-evaluated:Patient Re-evaluated prior to induction Oxygen Delivery Method: Circle system utilized Preoxygenation: Pre-oxygenation with 100% oxygen Induction Type: IV induction Ventilation: Mask ventilation without difficulty Laryngoscope Size: 4 and Mac Grade View: Grade II Tube size: 7.5 mm Number of attempts: 1 Airway Equipment and Method: Stylet Placement Confirmation: ETT inserted through vocal cords under direct vision, positive ETCO2, CO2 detector and breath sounds checked- equal and bilateral Secured at: 22 cm Tube secured with: Tape Dental Injury: Teeth and Oropharynx as per pre-operative assessment

## 2020-09-18 NOTE — Anesthesia Postprocedure Evaluation (Signed)
Anesthesia Post Note  Patient: Marcus [name]  Procedure(s) Performed: THORACIC LAMINECTOMY FOR TUMOR (Back) POSTERIOR THORACIC INSTRUMENTATION & FUSION Thoracic four - Thoracic eight (Back)     Patient location during evaluation: PACU Anesthesia Type: General Level of consciousness: awake and alert Pain management: pain level controlled Vital Signs Assessment: post-procedure vital signs reviewed and stable Respiratory status: spontaneous breathing, nonlabored ventilation, respiratory function stable and patient connected to nasal cannula oxygen Cardiovascular status: blood pressure returned to baseline and stable Postop Assessment: no apparent nausea or vomiting Anesthetic complications: no   No notable events documented.  Last Vitals:  Vitals:   09/18/20 1342 09/18/20 1800  BP:  (!) 168/70  Pulse: 80 85  Resp: 18 12  Temp: 36.8 C (!) 36.3 C  SpO2: 98% 100%    Last Pain:  Vitals:   09/18/20 1800  TempSrc:   PainSc: 0-No pain                 Arienna Benegas S

## 2020-09-18 NOTE — Transfer of Care (Addendum)
Immediate Anesthesia Transfer of Care Note  Patient: HELAMAN KERRICK  Procedure(s) Performed: THORACIC LAMINECTOMY FOR TUMOR (Back) POSTERIOR THORACIC INSTRUMENTATION & FUSION Thoracic four - Thoracic eight (Back)  Patient Location: PACU  Anesthesia Type:General  Level of Consciousness: awake, alert  and confused  Airway & Oxygen Therapy: Patient Spontanous Breathing and Patient connected to nasal cannula oxygen  Post-op Assessment: Report given to RN, Post -op Vital signs reviewed and stable and Patient moving all extremities X 4.  Patient confused as to location; slightly anxious.  C/O back pain and chest pain; EKG done by PACU RNs.  Speech fluent and clear, MAE x4.  Fentanyl given for pain and verbal reassurances given.  Dr. Marcie Bal aware.  Post vital signs: Reviewed and stable  Last Vitals:  Vitals Value Taken Time  BP 179/77 09/18/20 1808  Temp 36.8 C 09/18/20 1800  Pulse 88 09/18/20 1809  Resp 27 09/18/20 1809  SpO2 98 % 09/18/20 1809  Vitals shown include unvalidated device data.  Last Pain:  Vitals:   09/18/20 1800  TempSrc:   PainSc: 0-No pain         Complications: No notable events documented.

## 2020-09-18 NOTE — Progress Notes (Signed)
Orthopedic Tech Progress Note Patient Details:  [name] 03/15/1945 [id number] VertAlign TLSO has been ordered from [employer].  Patient ID: Marcus [name], male   DOB: August 26, 1945, 75 y.o.   MRN: [id number]  Jearld Lesch 09/18/2020, 8:29 PM

## 2020-09-19 LAB — CBC
HCT: 27.8 % — ABNORMAL LOW (ref 39.0–52.0)
Hemoglobin: 8.8 g/dL — ABNORMAL LOW (ref 13.0–17.0)
MCH: 25.1 pg — ABNORMAL LOW (ref 26.0–34.0)
MCHC: 31.7 g/dL (ref 30.0–36.0)
MCV: 79.2 fL — ABNORMAL LOW (ref 80.0–100.0)
Platelets: 241 10*3/uL (ref 150–400)
RBC: 3.51 MIL/uL — ABNORMAL LOW (ref 4.22–5.81)
RDW: 17 % — ABNORMAL HIGH (ref 11.5–15.5)
WBC: 14.6 10*3/uL — ABNORMAL HIGH (ref 4.0–10.5)
nRBC: 0 % (ref 0.0–0.2)

## 2020-09-19 LAB — BASIC METABOLIC PANEL
Anion gap: 6 (ref 5–15)
BUN: 9 mg/dL (ref 8–23)
CO2: 25 mmol/L (ref 22–32)
Calcium: 8.5 mg/dL — ABNORMAL LOW (ref 8.9–10.3)
Chloride: 104 mmol/L (ref 98–111)
Creatinine, Ser: 1 mg/dL (ref 0.61–1.24)
GFR, Estimated: >60 mL/min (ref >60)
Glucose, Bld: 160 mg/dL — ABNORMAL HIGH (ref 70–99)
Potassium: 4.6 mmol/L (ref 3.5–5.1)
Sodium: 135 mmol/L (ref 135–145)

## 2020-09-19 NOTE — Plan of Care (Signed)
  Problem: Clinical Measurements: Goal: Will remain free from infection Outcome: Progressing Goal: Cardiovascular complication will be avoided Outcome: Progressing   Problem: Nutrition: Goal: Adequate nutrition will be maintained Outcome: Progressing   Problem: Elimination: Goal: Will not experience complications related to bowel motility Outcome: Progressing

## 2020-09-19 NOTE — Evaluation (Addendum)
Physical Therapy Evaluation Patient Details Name: Marcus [name] MRN: [id number] DOB: 06-09-1945 Today's Date: 09/19/2020   History of Present Illness  75 year old male s/p T5-T6 laminectomy and resection of thoracic spine tumor. PMH of prostate cancer, acute renal failure, and R THA.  Clinical Impression   Patient received in bed, pleasant and cooperative with therapy. Did continue to require Min-ModA for bed mobility to maintain precautions, however once sitting able to mobilize with min guard/RW. Did need frequent reminders and cues to maintain precautions. Able to maintain balance without BUE support with wide BOS when donning briefs. Gait distance limited by pain today- seemed to actually have more pain after clamshell TLSO application. Left in bed with all needs met, bed alarm active. Will continue to follow.     Follow Up Recommendations Home health PT;Supervision for mobility/OOB    Equipment Recommendations  Rolling walker with 5" wheels;3in1 (PT);Wheelchair (measurements PT);Wheelchair cushion (measurements PT)    Recommendations for Other Services       Precautions / Restrictions Precautions Precautions: Fall;Back Precaution Booklet Issued: Yes (comment) Required Braces or Orthoses: Spinal Brace Spinal Brace: Thoracolumbosacral orthotic;Applied in sitting position Restrictions Weight Bearing Restrictions: No      Mobility  Bed Mobility Overal bed mobility: Needs Assistance Bed Mobility: Rolling;Sidelying to Sit;Sit to Sidelying Rolling: Min assist Sidelying to sit: Min assist;HOB elevated     Sit to sidelying: Mod assist;HOB elevated General bed mobility comments: Pt required Min-Mod A to lift legs on/off the bed during sit<>supine    Transfers Overall transfer level: Needs assistance Equipment used: Rolling walker (2 wheeled) Transfers: Sit to/from Stand Sit to Stand: Min guard;From elevated surface         General transfer comment: Pt required min  verbal cues to adhere to precautions and for correct hand placement/use of RW during transfers.  Ambulation/Gait Ambulation/Gait assistance: Min guard Gait Distance (Feet): 25 Feet Assistive device: Rolling walker (2 wheeled) Gait Pattern/deviations: Step-through pattern;Decreased step length - left;Decreased stride length;Trunk flexed Gait velocity: decreased   General Gait Details: cues to avoid bending at trunk and rotating during gait; c/o pain on left side of spine with clamshell brace on, which limited gait distance  Stairs            Wheelchair Mobility    Modified Rankin (Stroke Patients Only)       Balance Overall balance assessment: Mild deficits observed, not formally tested                                           Pertinent Vitals/Pain Pain Assessment: Faces Pain Score: 7  Faces Pain Scale: Hurts even more Pain Location: Back Pain Descriptors / Indicators: Aching;Sore Pain Intervention(s): Limited activity within patient's tolerance;Monitored during session;Repositioned    Home Living Family/patient expects to be discharged to:: Private residence Living Arrangements: Non-relatives/Friends (room-mate) Available Help at Discharge: Friend(s);Available PRN/intermittently Type of Home: House Home Access: Stairs to enter Entrance Stairs-Rails: None Entrance Stairs-Number of Steps: 3 at the street and then 1 on the [place]: Walker - 2 wheels Additional Comments: Pt reports roomate can assist with care upon return home.    Prior Function Level of Independence: Independent         Comments: Performs ADLs. roommate does IADLs. Friends give him rides     Hand Dominance        Extremity/Trunk Assessment  Upper Extremity Assessment Upper Extremity Assessment: Defer to OT evaluation    Lower Extremity Assessment Lower Extremity Assessment: Generalized weakness    Cervical / Trunk Assessment Cervical / Trunk  Assessment: Kyphotic (s/p spinal surgery)  Communication   Communication: No difficulties  Cognition Arousal/Alertness: Awake/alert Behavior During Therapy: WFL for tasks assessed/performed Overall Cognitive Status: Impaired/Different from baseline Area of Impairment: Problem solving;Safety/judgement                         Safety/Judgement: Decreased awareness of safety;Decreased awareness of deficits   Problem Solving: Slow processing;Decreased initiation;Difficulty sequencing General Comments: cues for sequencing with all tasks to maintain precautions, slow initiation with all movement      General Comments General comments (skin integrity, edema, etc.): Back brace being delivered upon departure.    Exercises     Assessment/Plan    PT Assessment Patient needs continued PT services  PT Problem List Decreased strength;Decreased knowledge of use of DME;Decreased activity tolerance;Decreased safety awareness;Decreased balance;Decreased knowledge of precautions;Decreased mobility       PT Treatment Interventions DME instruction;Balance training;Gait training;Stair training;Functional mobility training;Patient/family education;Therapeutic activities;Therapeutic exercise;Wheelchair mobility training    PT Goals (Current goals can be found in the Care Plan section)  Acute Rehab PT Goals Patient Stated Goal: To have less pain and go home PT Goal Formulation: With patient Time For Goal Achievement: 10/03/20 Potential to Achieve Goals: Fair    Frequency Min 5X/week   Barriers to discharge        Co-evaluation               AM-PAC PT "6 Clicks" Mobility  Outcome Measure Help needed turning from your back to your side while in a flat bed without using bedrails?: A Little Help needed moving from lying on your back to sitting on the side of a flat bed without using bedrails?: A Lot Help needed moving to and from a bed to a chair (including a wheelchair)?: A  Little Help needed standing up from a chair using your arms (e.g., wheelchair or bedside chair)?: A Little Help needed to walk in hospital room?: A Little Help needed climbing 3-5 steps with a railing? : A Lot 6 Click Score: 16    End of Session Equipment Utilized During Treatment: Back brace Activity Tolerance: Patient limited by pain Patient left: in bed;with call bell/phone within reach;with bed alarm set Nurse Communication: Mobility status PT Visit Diagnosis: Unsteadiness on feet (R26.81);Muscle weakness (generalized) (M62.81);Difficulty in walking, not elsewhere classified (R26.2);Pain Pain - Right/Left:  (back) Pain - part of body:  (back)    Time: 1030-1110 PT Time Calculation (min) (ACUTE ONLY): 40 min   Charges:   PT Evaluation $PT Eval Moderate Complexity: 1 Mod PT Treatments $Gait Training: 8-22 mins $Therapeutic Activity: 8-22 mins       Windell Norfolk, DPT, PN2   Supplemental Physical Therapist [place]    Pager [phone] Acute Rehab Office [phone]

## 2020-09-19 NOTE — Progress Notes (Signed)
Subjective: Patient reports that he is doing well overall. He has appropriate incisional pain that is well controlled. No acute events overnight.   Objective: Vital signs in last 24 hours: Temp:  [97.3 F (36.3 C)-99.4 F (37.4 C)] 98.4 F (36.9 C) (09/05 0718) Pulse Rate:  [58-90] 69 (09/05 0718) Resp:  [12-20] 16 (09/05 0718) BP: (111-204)/(55-98) 111/67 (09/05 0718) SpO2:  [93 %-100 %] 100 % (09/05 0718) Weight:  [70.6 kg] 70.6 kg (09/04 1342)  Intake/Output from previous day: 09/04 0701 - 09/05 0700 In: 3300 [I.V.:3200; IV Piggyback:100] Out: 1140 [Urine:740; Drains:150; Blood:250] Intake/Output this shift: No intake/output data recorded.  Physical Exam: Patient is awake, A/O X 4, conversant, and in good spirits. They are in NAD and VSS. Doing well. Speech is fluent and appropriate. MAEW. PERLA, EOMI. CNs grossly intact. Dressing is clean dry intact.     Lab Results: Recent Labs    09/17/20 1315 09/19/20 0442  WBC 6.0 14.6*  HGB 10.8* 8.8*  HCT 35.1* 27.8*  PLT 293 241   BMET Recent Labs    09/17/20 1315 09/19/20 0442  NA 141 135  K 3.3* 4.6  CL 107 104  CO2 24 25  GLUCOSE 89 160*  BUN 12 9  CREATININE 0.95 1.00  CALCIUM 9.4 8.5*    Studies/Results: DG Lumbar Spine 2-3 Views  Result Date: 09/18/2020 CLINICAL DATA:  Elective surgery. EXAM: LUMBAR SPINE - 2-3 VIEW COMPARISON:  Prior thoracic spine imaging. FINDINGS: Two lateral coned spot views of the spine obtained in the operating room. Pedicle screws are seen at 2 contiguous levels as well as the more superior level. Levels are not well delineated on these coned operative views. Fluoroscopy time 1 minute 6 seconds. IMPRESSION: Procedural fluoroscopy for spine surgery, recommend correlation with operative report for details. Electronically Signed   By: Keith Rake M.D.   On: 09/18/2020 18:36   DG C-Arm 1-60 Min-No Report  Result Date: 09/18/2020 Fluoroscopy was utilized by the requesting physician.   No radiographic interpretation.    Assessment/Plan: Patient is post-op day 1 s/p LT6 laminectomy and transpedicular decompression with instrumentation and fusion. He is recovering well only has complaints of mild incisional discomfort.  He is awaiting PT/OT evaluation.  Continue TLSO brace when OOB. Continue working on pain control, mobility and ambulating patient.   LOS: 2 days     Marvis Moeller, DNP, NP-C 09/19/2020, 9:56 AM

## 2020-09-19 NOTE — Evaluation (Signed)
Occupational Therapy Evaluation Patient Details Name: Marcus [name] MRN: [id number] DOB: [birth year] Today's Date: 09/19/2020    History of Present Illness 75 year old male s/p T5-T6 laminectomy and resection of thoracic spine tumor. PMH of prostate cancer, acute renal failure, and R THA.   Clinical Impression   PTA, pt was living in a house with a roomate, and was Independent in ADLs and light IADLs. Neither pt or roomate was driving and relied on others or cab for groceries/appointments. Pt was ambulating without an assistive device. Pt currently requiring supervision/min guard for upper body ADLs, Mod A for LB ADLs, and Min A for functional mobility. Pt required min verbal cues to adhere to spinal precautions this session. Pt with continued decreased balance, functional strength, and ROM limiting ability to complete self care tasks independently and safely. Pt would benefit from continued OT in the acute setting to address occupational performance and increased safety. Recommending HHOT with 24 hr supervision upon discharge home for increased carryover of precautions, and home safety. Will continue to follow in the acute setting.    Follow Up Recommendations  Home health OT    Equipment Recommendations  3 in 1 bedside commode    Recommendations for Other Services       Precautions / Restrictions Precautions Precautions: Fall;Back Precaution Booklet Issued: Yes (comment) Required Braces or Orthoses: Spinal Brace Spinal Brace: Thoracolumbosacral orthotic;Applied in sitting position (not in room) Restrictions Weight Bearing Restrictions: No      Mobility Bed Mobility Overal bed mobility: Needs Assistance Bed Mobility: Rolling;Supine to Sit;Sit to Supine Rolling: Supervision   Supine to sit: Min assist;HOB elevated Sit to supine: Min assist;HOB elevated   General bed mobility comments: Pt required Min A to lift legs on/off the bed during sit<>supine    Transfers Overall  transfer level: Needs assistance Equipment used: Rolling walker (2 wheeled) Transfers: Sit to/from Stand Sit to Stand: Min guard;From elevated surface         General transfer comment: Pt required min verbal cues to adhere to precautions and for correct hand placement/use of RW during transfers.    Balance Overall balance assessment: Mild deficits observed, not formally tested                                         ADL either performed or assessed with clinical judgement   ADL Overall ADL's : Needs assistance/impaired Eating/Feeding: Modified independent   Grooming: Min guard;Standing Grooming Details (indicate cue type and reason): Pt required min guard for balance while washing face at sink. Pt with one LOB that he self-corrected when closing eyes to wash face. Upper Body Bathing: Sitting;Supervision/ safety;Set up   Lower Body Bathing: Sitting/lateral leans;Mod A   Upper Body Dressing : Set up;Supervision/safety;Sitting   Lower Body Dressing: Sitting/lateral leans;Mod A   Toilet Transfer: Ambulation;RW;Min Psychiatric nurse Details (indicate cue type and reason): Required min guard for safety and cueing to adhere to precautions/hand placement. (simulated with bed)         Functional mobility during ADLs: Min guard;Rolling walker General ADL Comments: Pt with decreased functional strength, balance, pain, and dizziness post surgery limiting his ability to complete ADLs safely. Pt also with spinal precautions limiting ability to complete LB dressing independently. Pt educated on precautions and given handout with safe self care strategies to adhere to precautions.     Vision Baseline Vision/History: 1 Wears  glasses Ability to See in Adequate Light: 1 Impaired Patient Visual Report: No change from baseline Vision Assessment?: No apparent visual deficits     Perception     Praxis      Pertinent Vitals/Pain Pain Assessment: 0-10 Pain Score: 7   Pain Location: Back Pain Descriptors / Indicators: Sore Pain Intervention(s): Monitored during session;Limited activity within patient's tolerance;Repositioned     Hand Dominance     Extremity/Trunk Assessment Upper Extremity Assessment Upper Extremity Assessment: Generalized weakness   Lower Extremity Assessment Lower Extremity Assessment: Defer to PT evaluation   Cervical / Trunk Assessment Cervical / Trunk Assessment: Kyphotic (s/p spinal surgery)   Communication Communication Communication: No difficulties   Cognition Arousal/Alertness: Awake/alert Behavior During Therapy: WFL for tasks assessed/performed Overall Cognitive Status: Impaired/Different from baseline Area of Impairment: Problem solving                             Problem Solving: Slow processing     General Comments  Back brace being delivered upon departure.    Exercises     Shoulder Instructions      Home Living Family/patient expects to be discharged to:: Private residence Living Arrangements: Non-relatives/Friends (roommate) Available Help at Discharge: Friend(s);Available PRN/intermittently Type of Home: House Home Access: Stairs to enter [employer] of Steps: 3 at the street and then 1 on the porch ("Long walk from the street") Entrance Stairs-Rails: None       Bathroom Shower/Tub: Teacher, early years/pre: Standard     Home Equipment: Environmental consultant - 2 wheels   Additional Comments: Pt reports roomate can assist with care upon return home.      Prior Functioning/Environment Level of Independence: Independent        Comments: Performs ADLs. roommate does IADLs. Friends give him rides        OT Problem List: Decreased strength;Decreased range of motion;Decreased activity tolerance;Impaired balance (sitting and/or standing);Decreased safety awareness;Decreased knowledge of precautions;Pain      OT Treatment/Interventions:    Self-care/ADL  training;Therapeutic exercise;DME and/or AE instruction;Therapeutic activities   OT Goals(Current goals can be found in the care plan section) Acute Rehab OT Goals Patient Stated Goal: To have less pain and go home OT Goal Formulation: With patient Time For Goal Achievement: 10/03/20 Potential to Achieve Goals: Good  OT Frequency: Min 2X/week   Barriers to D/C: Decreased caregiver support   (Has roomate, but poor historian and not sure how much he will be able to assist.)       Co-evaluation              AM-PAC OT "6 Clicks" Daily Activity     Outcome Measure Help from another person eating meals?: None Help from another person taking care of personal grooming?: A Little Help from another person toileting, which includes using toliet, bedpan, or urinal?: A Little Help from another person bathing (including washing, rinsing, drying)?: A Little Help from another person to put on and taking off regular upper body clothing?: A Little Help from another person to put on and taking off regular lower body clothing?: A Little 6 Click Score: 19   End of Session Equipment Utilized During Treatment: Rolling walker;Gait belt (Back brace being delivered upon departure) Nurse Communication: Mobility status  Activity Tolerance: Patient tolerated treatment well Patient left: in bed;with call bell/phone within reach;with bed alarm set  OT Visit Diagnosis: Unsteadiness on feet (R26.81);Other abnormalities of gait and mobility (R26.89);Muscle  weakness (generalized) (M62.81);Pain Pain - part of body:  (back)                Time: [id number] OT Time Calculation (min): 31 min Charges:  OT General Charges $OT Visit: 1 Visit OT Evaluation $OT Eval Moderate Complexity: 1 Mod OT Treatments $Self Care/Home Management : 8-22 mins  Marcus [name], OTS Acute Rehab Office: [phone]   Marcus [name] 09/19/2020, 9:27 AM

## 2020-09-19 NOTE — Plan of Care (Signed)

## 2020-09-20 ENCOUNTER — Other Ambulatory Visit: Payer: Self-pay | Admitting: Radiation Therapy

## 2020-09-20 ENCOUNTER — Encounter (HOSPITAL_COMMUNITY): Payer: Self-pay | Admitting: Neurosurgery

## 2020-09-20 DIAGNOSIS — G9520 Unspecified cord compression: Secondary | ICD-10-CM | POA: Diagnosis not present

## 2020-09-20 DIAGNOSIS — D492 Neoplasm of unspecified behavior of bone, soft tissue, and skin: Secondary | ICD-10-CM

## 2020-09-20 DIAGNOSIS — C7951 Secondary malignant neoplasm of bone: Secondary | ICD-10-CM | POA: Diagnosis not present

## 2020-09-20 LAB — MULTIPLE MYELOMA PANEL, SERUM
Albumin SerPl Elph-Mcnc: 3.4 g/dL (ref 2.9–4.4)
Albumin/Glob SerPl: 1.1 (ref 0.7–1.7)
Alpha 1: 0.4 g/dL (ref 0.0–0.4)
Alpha2 Glob SerPl Elph-Mcnc: 0.9 g/dL (ref 0.4–1.0)
B-Globulin SerPl Elph-Mcnc: 1.3 g/dL (ref 0.7–1.3)
Gamma Glob SerPl Elph-Mcnc: 0.8 g/dL (ref 0.4–1.8)
Globulin, Total: 3.3 g/dL (ref 2.2–3.9)
IgA: 259 mg/dL (ref 61–437)
IgG (Immunoglobin G), Serum: 895 mg/dL (ref 603–1613)
IgM (Immunoglobulin M), Srm: 57 mg/dL (ref 15–143)
Total Protein ELP: 6.7 g/dL (ref 6.0–8.5)

## 2020-09-20 NOTE — Progress Notes (Signed)
Subjective: The patient is alert and pleasant.  He is ambulating with his TLSO.  His back is appropriately sore.  Objective: Vital signs in last 24 hours: Temp:  [97.6 F (36.4 C)-98.3 F (36.8 C)] 98.3 F (36.8 C) (09/05 2145) Pulse Rate:  [69-75] 71 (09/05 2145) Resp:  [16-17] 17 (09/05 2145) BP: (119-127)/(51-69) 127/60 (09/05 2145) SpO2:  [93 %-94 %] 93 % (09/05 2145) Weight:  [70.7 kg] 70.7 kg (09/06 0500) Estimated body mass index is 22.35 kg/m as calculated from the following:   Height as of this encounter: '5\' 10"'$  (1.778 m).   Weight as of this encounter: 70.7 kg.   Intake/Output from previous day: 09/05 0701 - 09/06 0700 In: -  Out: 200 [Urine:200] Intake/Output this shift: No intake/output data recorded.  Physical exam the patient is alert and pleasant.  His lower extremity strength is grossly normal.  His dressing has a small bloodstain.  Lab Results: Recent Labs    09/17/20 1315 09/19/20 0442  WBC 6.0 14.6*  HGB 10.8* 8.8*  HCT 35.1* 27.8*  PLT 293 241   BMET Recent Labs    09/17/20 1315 09/19/20 0442  NA 141 135  K 3.3* 4.6  CL 107 104  CO2 24 25  GLUCOSE 89 160*  BUN 12 9  CREATININE 0.95 1.00  CALCIUM 9.4 8.5*    Studies/Results: DG Lumbar Spine 2-3 Views  Result Date: 09/18/2020 CLINICAL DATA:  Elective surgery. EXAM: LUMBAR SPINE - 2-3 VIEW COMPARISON:  Prior thoracic spine imaging. FINDINGS: Two lateral coned spot views of the spine obtained in the operating room. Pedicle screws are seen at 2 contiguous levels as well as the more superior level. Levels are not well delineated on these coned operative views. Fluoroscopy time 1 minute 6 seconds. IMPRESSION: Procedural fluoroscopy for spine surgery, recommend correlation with operative report for details. Electronically Signed   By: Keith Rake M.D.   On: 09/18/2020 18:36   DG C-Arm 1-60 Min-No Report  Result Date: 09/18/2020 Fluoroscopy was utilized by the requesting physician.  No  radiographic interpretation.    Assessment/Plan: Postop day #2: The patient is doing well.  We will likely discharge him home tomorrow.  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  LOS: 3 days     Ophelia Charter 09/20/2020, 7:52 AM     Patient ID: Marcus [name], male   DOB: January 21, 1945, 75 y.o.   MRN: [id number]

## 2020-09-20 NOTE — Progress Notes (Signed)
Physical Therapy Treatment Patient Details Name: Marcus [name] MRN: [id number] DOB: 08/19/1945 Today's Date: 09/20/2020    History of Present Illness 75 year old male s/p T5-T6 laminectomy and resection of thoracic spine tumor. PMH of prostate cancer, acute renal failure, and R THA.    PT Comments    Continuing work on functional mobility and activity tolerance;  Session initially focused on progressive amb, however amb limited due to pain; and while we anticipate some pain due to inherent nature of pathology and surgery, pt reports the brace is ill-fitting (especially the anterior piece);   Noted pain from vertalign brace, especially in seated position, with extra pressure from brace at tops of thighs and at top of brace towards neck; discussed with RN, and gave her the number for orthotist to see about possibly trimming top and bottom of anterior aspect of brace  Follow Up Recommendations  Home health PT;Supervision for mobility/OOB     Equipment Recommendations  Rolling walker with 5" wheels;3in1 (PT);Wheelchair (measurements PT);Wheelchair cushion (measurements PT)    Recommendations for Other Services       Precautions / Restrictions Precautions Precautions: Fall;Back Precaution Booklet Issued: Yes (comment) Precaution Comments: Educated on donning TLSO brace as perscribed for pain management, additional education needed for carryover. Required Braces or Orthoses: Spinal Brace Spinal Brace: Thoracolumbosacral orthotic;Applied in sitting position    Mobility  Bed Mobility Overal bed mobility: Needs Assistance Bed Mobility: Rolling;Supine to Sit;Sit to Supine Rolling: Min guard Sidelying to sit: Min assist;HOB elevated     Sit to sidelying: Mod assist;HOB elevated General bed mobility comments: Pt required verbal cues for correct sequencing of log-roll technique. Mod assist to help LEs back into bed    Transfers Overall transfer level: Needs assistance Equipment  used: Rolling walker (2 wheeled) Transfers: Sit to/from Stand Sit to Stand: Min guard         General transfer comment: Cues to scoot closer to EOB to initiate; noted decr control of descent to sit after in room amb  Ambulation/Gait Ambulation/Gait assistance: Min guard Gait Distance (Feet): 12 Feet Assistive device: Rolling walker (2 wheeled) Gait Pattern/deviations: Step-through pattern;Decreased step length - left;Decreased stride length;Trunk flexed Gait velocity: decreased   General Gait Details: cues to avoid bending at trunk and rotating during gait; c/o pain on left side of spine with clamshell brace on, which limited gait distance; pain today seemed worse than previous session   Stairs             Wheelchair Mobility    Modified Rankin (Stroke Patients Only)       Balance Overall balance assessment: Mild deficits observed, not formally tested                                          Cognition Arousal/Alertness: Awake/alert Behavior During Therapy: WFL for tasks assessed/performed Overall Cognitive Status: Impaired/Different from baseline Area of Impairment: Safety/judgement;Memory;Problem solving                     Memory: Decreased short-term memory;Decreased recall of precautions   Safety/Judgement: Decreased awareness of safety   Problem Solving: Slow processing;Decreased initiation;Requires verbal cues General Comments: Requires Mod verbal cues to adhere to precautions and verbalized recall of 2/3 back precautions since educated yesterday.      Exercises      General Comments General comments (skin integrity, edema, etc.): Noted  pain from vertalign brace, especially in seated position, with extra pressure from brace at tops of thighs and at top of brace towards neck; discussed with RN, and gave her the number for orthotist to see about possibly trimming top and bottom of anterior aspect of brace      Pertinent  Vitals/Pain Pain Assessment: 0-10 Pain Score: 7  Pain Descriptors / Indicators: Grimacing;Aching Pain Intervention(s): Monitored during session;Repositioned;Patient requesting pain meds-RN notified;Other (comment) (Discussed brace fit with RN)    Home Living                      Prior Function            PT Goals (current goals can now be found in the care plan section) Acute Rehab PT Goals Patient Stated Goal: To have less pain and go home PT Goal Formulation: With patient Time For Goal Achievement: 10/03/20 Potential to Achieve Goals: Fair Progress towards PT goals: Progressing toward goals (Slowly)    Frequency    Min 5X/week      PT Plan Current plan remains appropriate    Co-evaluation              AM-PAC PT "6 Clicks" Mobility   Outcome Measure  Help needed turning from your back to your side while in a flat bed without using bedrails?: A Little Help needed moving from lying on your back to sitting on the side of a flat bed without using bedrails?: A Little Help needed moving to and from a bed to a chair (including a wheelchair)?: A Little Help needed standing up from a chair using your arms (e.g., wheelchair or bedside chair)?: A Little Help needed to walk in hospital room?: A Lot Help needed climbing 3-5 steps with a railing? : A Lot 6 Click Score: 16    End of Session Equipment Utilized During Treatment: Back brace Activity Tolerance: Patient limited by pain Patient left: in bed;with call bell/phone within reach;with bed alarm set Nurse Communication: Mobility status PT Visit Diagnosis: Unsteadiness on feet (R26.81);Muscle weakness (generalized) (M62.81);Difficulty in walking, not elsewhere classified (R26.2);Pain Pain - Right/Left:  (back) Pain - part of body:  (back)     Time: 1203-1223 PT Time Calculation (min) (ACUTE ONLY): 20 min  Charges:  $Gait Training: 8-22 mins                     Roney Marion, [place]  Acute Rehabilitation  Services Pager [phone] Office [place] 09/20/2020, 2:37 PM

## 2020-09-20 NOTE — Plan of Care (Signed)

## 2020-09-20 NOTE — Progress Notes (Signed)
Occupational Therapy Treatment Patient Details Name: Marcus [name] MRN: [id number] DOB: Aug 19, 1945 Today's Date: 09/20/2020    History of present illness 74 year old male s/p T5-T6 laminectomy and resection of thoracic spine tumor. PMH of prostate cancer, acute renal failure, and R THA.   OT comments  Pt with progress towards goals this session. Pt verbalized recall of 2/3 precautions since being educated during initial eval. Pt completed log-rolling to sit EOB with supervision and sit<>stand with ambulation to chair with supervison-Min guard. Pt educated on LB dressing using reacher and demonstrated understanding of education, additional education needed to ensure carryover. Pt would benefit from education on use of sock-aid and safe tub-shower transfers in future visits. Continuing to recommend HHOT to address home safety needs post-acute stay, will continue to follow in the acute setting.    Follow Up Recommendations  Home health OT    Equipment Recommendations  3 in 1 bedside commode    Recommendations for Other Services      Precautions / Restrictions Precautions Precautions: Fall;Back Precaution Booklet Issued: Yes (comment) Precaution Comments: Educated on donning TLSO brace as perscribed for pain management, additional education needed for carryover. Required Braces or Orthoses: Spinal Brace Spinal Brace: Thoracolumbosacral orthotic;Applied in sitting position Restrictions Weight Bearing Restrictions: No       Mobility Bed Mobility Overal bed mobility: Needs Assistance Bed Mobility: Rolling;Supine to Sit Rolling: Supervision   Supine to sit: Supervision     General bed mobility comments: Pt required verbal cues for correct sequencing of log-roll technique.    Transfers Overall transfer level: Needs assistance   Transfers: Sit to/from Stand Sit to Stand: Supervision         General transfer comment: Pt completed Sit<>stand and ambulation to bedside  recliner without use of RW. Required supervision for safety and verbal cues to identify tripping hazard.    Balance Overall balance assessment: Mild deficits observed, not formally tested                                         ADL either performed or assessed with clinical judgement   ADL Overall ADL's : Needs assistance/impaired Eating/Feeding: Modified independent Eating/Feeding Details (indicate cue type and reason): Increased time                 Lower Body Dressing: With adaptive equipment;Sit to/from stand;Min guard;Adhering to back precautions Lower Body Dressing Details (indicate cue type and reason): Pt educated compensatory strategy with reacher to donn pants without breaking precautions. Required Min guard to donn pants sitting EOB for safety and min verbal cues to use reacher. Toilet Transfer: Copy Details (indicate cue type and reason):  (simulated with bed) Toileting- Clothing Manipulation and Hygiene: Supervision/safety;Sit to/from stand Toileting - Clothing Manipulation Details (indicate cue type and reason): for safety     Functional mobility during ADLs: Min guard;Cueing for safety General ADL Comments: Pt with continued decreased functional strength, balance, and recall of precautions limiting ability to complete self care tasks independenty and safely.     Vision   Vision Assessment?: No apparent visual deficits   Perception     Praxis      Cognition Arousal/Alertness: Awake/alert Behavior During Therapy: WFL for tasks assessed/performed Overall Cognitive Status: Impaired/Different from baseline Area of Impairment: Safety/judgement;Memory;Problem solving  Memory: Decreased short-term memory;Decreased recall of precautions   Safety/Judgement: Decreased awareness of safety   Problem Solving: Slow processing;Decreased initiation;Requires verbal cues General Comments:  Requires Mod verbal cues to adhere to precautions and verbalized recall of 2/3 back precautions since educated yesterday.        Exercises     Shoulder Instructions       General Comments      Pertinent Vitals/ Pain       Pain Assessment: Faces Faces Pain Scale: Hurts whole lot Pain Descriptors / Indicators: Grimacing;Aching Pain Intervention(s): Monitored during session;Other (comment) (Applied TLSO brace in sitting and educated on perscribed wear for pain relief.)  Home Living                                          Prior Functioning/Environment              Frequency  Min 2X/week        Progress Toward Goals  OT Goals(current goals can now be found in the care plan section)  Progress towards OT goals: Progressing toward goals  Acute Rehab OT Goals Patient Stated Goal: To have less pain and go home OT Goal Formulation: With patient Time For Goal Achievement: 10/03/20 Potential to Achieve Goals: Good ADL Goals Pt Will Perform Grooming: with supervision;standing Pt Will Perform Upper Body Dressing: with supervision;sitting Pt Will Perform Lower Body Dressing: with supervision;sit to/from stand;with adaptive equipment Pt Will Transfer to Toilet: with supervision;ambulating;bedside commode Pt Will Perform Toileting - Clothing Manipulation and hygiene: with supervision;sit to/from stand Pt Will Perform Tub/Shower Transfer: Tub transfer;with min guard assist;rolling walker;3 in 1  Plan      Co-evaluation                 AM-PAC OT "6 Clicks" Daily Activity     Outcome Measure   Help from another person eating meals?: None Help from another person taking care of personal grooming?: A Little Help from another person toileting, which includes using toliet, bedpan, or urinal?: A Little Help from another person bathing (including washing, rinsing, drying)?: A Little Help from another person to put on and taking off regular upper body  clothing?: A Little Help from another person to put on and taking off regular lower body clothing?: A Little 6 Click Score: 19    End of Session Equipment Utilized During Treatment: Gait belt  OT Visit Diagnosis: Unsteadiness on feet (R26.81);Other abnormalities of gait and mobility (R26.89);Muscle weakness (generalized) (M62.81);Pain Pain - Right/Left: Left Pain - part of body:  (Back, L rib area)   Activity Tolerance Patient tolerated treatment well   Patient Left in chair;with chair alarm set;with nursing/sitter in room   Nurse Communication Mobility status        Time: [id number] OT Time Calculation (min): 19 min  Charges: OT General Charges $OT Visit: 1 Visit OT Treatments $Self Care/Home Management : 8-22 mins  Jackquline Denmark, OTS Acute Rehab Office: [phone]    Rajanee Schuelke 09/20/2020, 9:16 AM

## 2020-09-20 NOTE — Plan of Care (Signed)
?  Problem: Clinical Measurements: ?Goal: Will remain free from infection ?Outcome: Progressing ?Goal: Diagnostic test results will improve ?Outcome: Progressing ?Goal: Respiratory complications will improve ?Outcome: Progressing ?  ?

## 2020-09-20 NOTE — Progress Notes (Addendum)
HEMATOLOGY-ONCOLOGY PROGRESS NOTE  SUBJECTIVE: Marcus. Marcus [name] was admitted to the hospital due to abnormal scans.  An MRI showed a T6 lesion with significant spinal cord compression.  He was seen by neurosurgery and underwent T5 and T6 laminectomy for resection of the tumor on 09/18/2020.  This was sent to pathology which is currently pending.  When seen today, he reports pain at surgical site.  He has been working with physical therapy.  He denies weakness or decrease in sensation in his [name] extremities.  He offers no other complaints today.    Oncology History Overview Note  Called the available numbers this morning, his brother in law Marcus Marcus [name] answered.  Advised him to please talk to the patient ASAP because we are unable to get hold of him. I mentioned this is regarding the abnormal MRI and he needs to come to the ER for further treatment. Marcus Marcus [name] expressed understanding and he will try to get hold of Marcus [name] ASAP. The urgency of the matter was clearly conveyed. Marcus Marcus [name] apparently talked to his room mate and conveyed the message to his room mate that Marcus Marcus [name] need to get in touch back with him urgently and he reassured me he will relay the message to Marcus Marcus [name] ASAP     REVIEW OF SYSTEMS:   The remainder of a 10 point review of systems is negative.  I have reviewed the past medical history, past surgical history, social history and family history with the patient and they are unchanged from previous note.   PHYSICAL EXAMINATION: ECOG PERFORMANCE STATUS: 2 - Symptomatic, <50% confined to bed  Vitals:   09/20/20 0700 09/20/20 0850  BP: (!) 153/75 128/70  Pulse: 77   Resp: 18   Temp: 98.4 F (36.9 C)   SpO2: 93%    Filed Weights   09/17/20 2023 09/18/20 1342 09/20/20 0500  Weight: 70.6 kg 70.6 kg 70.7 kg    Intake/Output from previous day: 09/05 0701 - 09/06 0700 In: -  Out: 200 [Urine:200]  GENERAL:alert, no distress and comfortable SKIN: Dressing to upper back without  drainage EYES: normal, Conjunctiva are pink and non-injected, sclera clear LUNGS: clear to auscultation and percussion with normal breathing effort HEART: regular rate & rhythm and no murmurs and no [name] extremity edema ABDOMEN:abdomen soft, non-tender and normal bowel sounds NEURO: alert & oriented x 3 with fluent speech, no focal motor/sensory deficits  LABORATORY DATA:  I have reviewed the data as listed CMP Latest Ref Rng & Units 09/19/2020 09/17/2020 09/15/2020  Glucose 70 - 99 mg/dL 160(H) 89 104(H)  BUN 8 - 23 mg/dL '9 12 11  '$ Creatinine 0.61 - 1.24 mg/dL 1.00 0.95 1.01  Sodium 135 - 145 mmol/L 135 141 139  Potassium 3.5 - 5.1 mmol/L 4.6 3.3(L) 3.4(L)  Chloride 98 - 111 mmol/L 104 107 102  CO2 22 - 32 mmol/L '25 24 23  '$ Calcium 8.9 - 10.3 mg/dL 8.5(L) 9.4 9.6  Total Protein 6.5 - 8.1 g/dL - 7.6 7.8  Total Bilirubin 0.3 - 1.2 mg/dL - 0.8 0.7  Alkaline Phos 38 - 126 U/L - 85 105  AST 15 - 41 U/L - 17 13(L)  ALT 0 - 44 U/L - 13 11    Lab Results  Component Value Date   WBC 14.6 (H) 09/19/2020   HGB 8.8 (L) 09/19/2020   HCT 27.8 (L) 09/19/2020   MCV 79.2 (L) 09/19/2020   PLT 241 09/19/2020   NEUTROABS 4.3 09/17/2020  DG Ribs Unilateral Right  Result Date: 08/31/2020 CLINICAL DATA:  [name] rib pain, back pain EXAM: RIGHT RIBS - 2 VIEW COMPARISON:  07/23/2013 FINDINGS: No fracture or other bone lesions are seen involving the ribs. IMPRESSION: Negative. Electronically Signed   By: Rolm Baptise M.D.   On: 08/31/2020 03:53   DG Ribs Unilateral W/Chest Left  Result Date: 08/31/2020 CLINICAL DATA:  [name] rib pain, back pain EXAM: LEFT RIBS AND CHEST - 3+ VIEW COMPARISON:  07/23/2013 FINDINGS: No fracture or other bone lesions are seen involving the ribs. There is no evidence of pneumothorax or pleural effusion. Both lungs are clear. Heart size and mediastinal contours are within normal limits. IMPRESSION: Negative. Electronically Signed   By: Rolm Baptise M.D.   On: 08/31/2020 03:53    DG Thoracic Spine 2 View  Result Date: 08/31/2020 CLINICAL DATA:  [name] rib pain, back pain EXAM: THORACIC SPINE 2 VIEWS COMPARISON:  None. FINDINGS: There is no evidence of thoracic spine fracture. Alignment is normal. No other significant bone abnormalities are identified. IMPRESSION: Negative. Electronically Signed   By: Rolm Baptise M.D.   On: 08/31/2020 03:52   DG Lumbar Spine 2-3 Views  Result Date: 09/18/2020 CLINICAL DATA:  Elective surgery. EXAM: LUMBAR SPINE - 2-3 VIEW COMPARISON:  Prior thoracic spine imaging. FINDINGS: Two lateral coned spot views of the spine obtained in the operating room. Pedicle screws are seen at 2 contiguous levels as well as the more superior level. Levels are not well delineated on these coned operative views. Fluoroscopy time 1 minute 6 seconds. IMPRESSION: Procedural fluoroscopy for spine surgery, recommend correlation with operative report for details. Electronically Signed   By: Keith Rake M.D.   On: 09/18/2020 18:36   CT Chest Wo Contrast  Result Date: 08/31/2020 CLINICAL DATA:  Chest pain or shortness of breath with pleurisy or effusion suspected. Bilateral chest wall pain and midthoracic pain for 1 month EXAM: CT CHEST WITHOUT CONTRAST TECHNIQUE: Multidetector CT imaging of the chest was performed following the standard protocol without IV contrast. COMPARISON:  None similar FINDINGS: Cardiovascular: Normal heart size. Small volume pericardial fluid. Scattered atheromatous plaque with a low-density plaque at the isthmus. Mediastinum/Nodes: Negative for adenopathy Lungs/Pleura: Lungs are clear. No pleural effusion or pneumothorax. Upper Abdomen: Low-density in the subcapsular right liver favoring cyst which was seen in this location to a smaller degree in 2015. There are multiple enlarged nodes at the porta hepatis/gastrohepatic ligament with nodes measuring up to 2.5 cm in length. Musculoskeletal: Reference dedicated thoracic spine CT concerning  sclerotic bone lesion at T5 and T6 with canal and foraminal involvement. No acute fracture. IMPRESSION: 1. Sclerotic bone lesion at T5 and T6 with canal and foraminal infiltration, reference dedicated thoracic spine reformats. 2. Upper abdominal adenopathy. 3. 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 Electronically Signed   By: Monte Fantasia M.D.   On: 08/31/2020 08:49   Marcus THORACIC SPINE W WO CONTRAST  Result Date: 09/16/2020 CLINICAL DATA:  Cord compression. 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 Evaluate for bone metastases. EXAM: MRI THORACIC WITHOUT AND WITH CONTRAST TECHNIQUE: Multiplanar and multiecho pulse sequences of the thoracic spine were obtained without and with intravenous contrast. CONTRAST:  7.25m GADAVIST GADOBUTROL 1 MMOL/ML IV SOLN COMPARISON:  CT of the thoracic spine 08/31/2020. FINDINGS: Alignment:  No significant spondylolisthesis. Vertebrae: Osseous lesions within the posterior T5 vertebral body and T5 posterior elements, as well as T6 vertebral body and posterior elements. Involvement of the T6  vertebral body and posterior elements is extensive. In addition to involvement of the right T6 transverse process, there is also involvement of the posterior right sixth rib. There is also involvement of the bilateral T7 pedicles/articular pillars, T7 spinous process, right T7 transverse process, and subtle involvement of the posterior T7 vertebral body. There is subtle heterogeneous enhancement of the T8 spinous process, which could reflect an additional lesion (series 23, image 8). Aditionally, there is partially ossified enhancing epidural tumor within the right posterolateral aspect of the spinal canal at the T5-T6 level. Resultant severe spinal canal stenosis with spinal cord compression. There may be subtle T2 hyperintense signal abnormality within the spinal cord at this level compatible with myelomalacia and/or  focal edema (series 17, image 10). Additionally, tumor encroaches upon the right T5-T6 and T6-T7 neural foramina. Cord: Significant spinal cord compression at T5-T6 by tumor, as described above. Possible subtle T2/STIR hyperintense signal abnormality within the spinal cord at this level, which may reflect focal edema and/or myelomalacia (series 17, image 10). Paraspinal and other soft tissues: No abnormality identified within included portions of the thorax or upper abdomen/retroperitoneum. Disc levels: No more than mild disc degeneration within the thoracic spine. No significant focal disc herniation or degenerative spinal canal stenosis. Mild facet arthrosis within the [name] thoracic spine. No degenerative compressive foraminal stenosis. Impressions 1 and 2 will be called to the ordering clinician or representative by the Radiologist Assistant, and communication documented in the PACS or [employer]. IMPRESSION: Osseous lesions within the T5, T6, T7 and possibly T8 vertebra, as well as right 6th rib as detailed. These findings are highly suspicious for osseous metastatic disease given the patient's history of prostate cancer. 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 Subtle signal abnormality is questioned within the spinal cord at this level, and this may reflect focal edema and/or myelomalacia. Tumor also encroaches upon the right T5-T6 and T6-T7 neural foramina. Electronically Signed   By: Kellie Simmering D.O.   On: 09/16/2020 20:04   CT T-SPINE NO CHARGE  Result Date: 08/31/2020 CLINICAL DATA:  Back pain for a month EXAM: CT THORACIC SPINE WITHOUT CONTRAST TECHNIQUE: Multidetector CT images of the thoracic were obtained using the standard protocol without intravenous contrast. COMPARISON:  No similar CT comparison FINDINGS: Alignment: Normal  Vertebrae: Sclerosis in the vertebral bodies of T5, T7, especially T6. At T6 there is extension in to right-sided pedicle and posterior elements with extraosseous partially ossified tissue effacing the thecal sac from posterolateral and involving the right T5-6 and T6-7 foramina. Negative for fracture deformity. Paraspinal and other soft tissues: Reported separately Disc levels: Less than typical degenerative changes in the spine. IMPRESSION: Sclerotic bone lesion at T5-T7, greatest at T6 where there is posterior element involvement with right-sided epidural tumor and right T5-6, T6-7 foraminal infiltration. Cord deformation/compression is present at T6. There is history of prostate cancer in the past. Electronically Signed   By: Monte Fantasia M.D.   On: 08/31/2020 08:53   DG C-Arm 1-60 Min-No Report  Result Date: 09/18/2020 Fluoroscopy was utilized by the requesting physician.  No radiographic interpretation.    ASSESSMENT AND PLAN: 75 year old with previous history of high risk prostate cancer lost to follow-up presenting with   #1 T5-T7 sclerotic bone lesion involving posterior elements with right-sided epidural tumor and T5-6 T6-7 foraminal infiltration. Concern for spinal cord deformation/compression. Patient has no  clinical symptoms of cord compression at this time with no bowel or bladder symptoms or [name] extremity weakness. Findings are concerning for metastatic prostate cancer. Rule out myeloma or other primary cancer with bone mets.   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 Tresa Moore.  Had extracapsular extension and positive resection margins. Status post postoperative radiation therapy by Dr. Tammi Klippel. Lost to follow-up after treatment.   #3 active smoker half a pack per day #4 poor medical follow-up  Plan -Status post decompression of his cord compression.  Now recovering  well from surgery.  We will plan for outpatient follow-up. -Will need referral to radiation oncology as an outpatient. -Whole-body PET CT scan to evaluate extent of metastatic disease and also to evaluate for other potential primary site. -We will likely need systemic prostate cancer treatment if this is confirmed to be metastatic prostate cancer.    Outpatient follow-up is currently scheduled on 09/29/2020.   LOS: 3 days   Mikey Bussing, DNP, AGPCNP-BC, AOCNP 09/20/20   ADDENDUM  .Patient was Personally and independently interviewed, examined and relevant elements of the history of present illness were reviewed in details and an assessment and plan was created. All elements of the patient's history of present illness , assessment and plan were discussed in details with Mikey Bussing, DNP, AGPCNP-BC, AOCNP. The above documentation reflects our combined findings assessment and plan.   Sullivan Lone MD MS

## 2020-09-21 DIAGNOSIS — C7951 Secondary malignant neoplasm of bone: Secondary | ICD-10-CM

## 2020-09-21 DIAGNOSIS — G9520 Unspecified cord compression: Secondary | ICD-10-CM

## 2020-09-21 LAB — TYPE AND SCREEN
ABO/RH(D): A POS
Antibody Screen: NEGATIVE
Unit division: 0
Unit division: 0

## 2020-09-21 LAB — BPAM RBC
Blood Product Expiration Date: 202210022359
Blood Product Expiration Date: 202210022359
ISSUE DATE / TIME: 202209022358
ISSUE DATE / TIME: 202209022358
Unit Type and Rh: 6200
Unit Type and Rh: 6200

## 2020-09-21 NOTE — Progress Notes (Signed)
Physical Therapy Treatment Patient Details Name: Marcus [name] MRN: [id number] DOB: [birth year] Today's Date: 09/21/2020    History of Present Illness 75 year old male s/p T5-T6 laminectomy and resection of thoracic spine tumor. PMH of prostate cancer, acute renal failure, and R THA.    PT Comments    Pt showed improved recall and maintenance of precautions during session. Pt demonstrated increased ambulation distance with RW in hall, pain limiting. Assist level with transfers and ambulation was similar compared to previous session. Bed mobility upon return was painful to the L side. Recommend performing bed mobility tasks to the R in upcoming sessions to limit exacerbation of pain. Pt education emphasized importance of upright posture and doffing brace. Continue to recommend HHPT with supervision for mobility given pt's performance during session and support at home.    Follow Up Recommendations  Home health PT;Supervision for mobility/OOB     Equipment Recommendations  Rolling walker with 5" wheels;3in1 (PT);Wheelchair (measurements PT);Wheelchair cushion (measurements PT)    Recommendations for Other Services       Precautions / Restrictions Precautions Precautions: Fall;Back Precaution Booklet Issued: Yes (comment) Precaution Comments: handout provided in previous session, with emphasis on brace management and precautions Required Braces or Orthoses: Spinal Brace Spinal Brace: Thoracolumbosacral orthotic Restrictions Weight Bearing Restrictions: No    Mobility  Bed Mobility Overal bed mobility: Needs Assistance Bed Mobility: Sit to Supine       Sit to supine: HOB elevated;Supervision   General bed mobility comments: pt in recliner upon entry. Supervision for sit to supine with increased time. Physical assist not required. Pt in discomfort/pain when performing.    Transfers Overall transfer level: Needs assistance Equipment used: Rolling walker (2  wheeled) Transfers: Sit to/from Stand Sit to Stand: Min guard         General transfer comment: Sit to stand from recliner with TLSO brace donned. Min guard for safety with cues given for hand placement.  Ambulation/Gait Ambulation/Gait assistance: Supervision Gait Distance (Feet): 50 Feet Assistive device: Rolling walker (2 wheeled) Gait Pattern/deviations: Step-through pattern;Trunk flexed;Decreased stride length Gait velocity: Decreased Gait velocity interpretation: 1.31 - 2.62 ft/sec, indicative of limited community ambulator General Gait Details: Cued for upright posture prior to ambulation. Pt ambulated with slight trunk flexion, but adjusted to upright posture intermittently without cues provided. Limited by pain.   Stairs             Wheelchair Mobility    Modified Rankin (Stroke Patients Only)       Balance Overall balance assessment: Needs assistance Sitting-balance support: Feet supported;No upper extremity supported Sitting balance-Leahy Scale: Good Sitting balance - Comments: Able to maintain sitting balance without UE support while taking off brace   Standing balance support: During functional activity;Bilateral upper extremity supported Standing balance-Leahy Scale: Fair Standing balance comment: Able to maintain static balance intermittently without BUE support in bathroom and in hall. Requires BUE support on RW when ambulating.                            Cognition Arousal/Alertness: Awake/alert Behavior During Therapy: WFL for tasks assessed/performed Overall Cognitive Status: Within Functional Limits for tasks assessed                                 General Comments: WFL for simple functional tasks. Able to provide cues to PT on how to donn adult diaper  Exercises      General Comments General comments (skin integrity, edema, etc.): Noted increased pain on L chest/axilla area toward pec      Pertinent  Vitals/Pain Faces Pain Scale: Hurts whole lot Pain Location: L chest and axilla area toward pec. Pain Descriptors / Indicators: Discomfort;Grimacing Pain Intervention(s): Limited activity within patient's tolerance;Monitored during session;Repositioned    Home Living                      Prior Function            PT Goals (current goals can now be found in the care plan section) Acute Rehab PT Goals PT Goal Formulation: With patient Time For Goal Achievement: 10/03/20 Potential to Achieve Goals: Fair Progress towards PT goals: Progressing toward goals    Frequency    Min 5X/week      PT Plan Current plan remains appropriate    Co-evaluation              AM-PAC PT "6 Clicks" Mobility   Outcome Measure  Help needed turning from your back to your side while in a flat bed without using bedrails?: A Little Help needed moving from lying on your back to sitting on the side of a flat bed without using bedrails?: A Little Help needed moving to and from a bed to a chair (including a wheelchair)?: A Little Help needed standing up from a chair using your arms (e.g., wheelchair or bedside chair)?: A Little Help needed to walk in hospital room?: A Lot Help needed climbing 3-5 steps with a railing? : A Lot 6 Click Score: 16    End of Session Equipment Utilized During Treatment: Back brace Activity Tolerance: Patient limited by pain Patient left: in bed;with call bell/phone within reach;with bed alarm set Nurse Communication: Mobility status PT Visit Diagnosis: Unsteadiness on feet (R26.81);Muscle weakness (generalized) (M62.81);Difficulty in walking, not elsewhere classified (R26.2);Pain     Time: 1335-1359 PT Time Calculation (min) (ACUTE ONLY): 24 min  Charges:  $Gait Training: 8-22 mins $Therapeutic Activity: 8-22 mins                     Louie Casa, SPT Acute Rehab: [phone]]    Domingo Dimes 09/21/2020, 4:06 PM

## 2020-09-21 NOTE — Care Management Important Message (Signed)
Important Message  Patient Details  Name: Marcus [name] MRN: [id number] Date of Birth: [birth year]   Medicare Important Message Given:  Yes     Orbie Pyo 09/21/2020, 8:29 AM

## 2020-09-21 NOTE — TOC Initial Note (Addendum)
Transition of Care [facility]) - Initial/Assessment Note    Patient Details  Name: Marcus [name] MRN: [id number] Date of Birth: 01-12-1946  [facility] CM/SW Contact:    Sharin Mons, RN Phone Number: 09/21/2020, 10:04 AM  Clinical Narrative:                  -s/p T5-T6 laminectomy and resection of thoracic spine tumor, 9/4. Hx of prostate cancer, acute renal failure, and R THA  From home with roommate. States PTA independent with ADL's. States  has supportive family and friends. NCM shared PT/OT's recommendation for home health services. Pt agreeable to [facility] services. Choice offered. Pt without preference. Referral made with [facility] and accepted. Face to face will be  needed from MD for home health PT/OT services. Referral made with [employer] for W/C and rolling walker. Pt declined W/C. RW will be delivered to bedside prior to d/c.  Pt without concerns regarding Rx medications.  PCP: [facility], SYED ASAD A  TOC team following and will assist with TOC needs....  Expected Discharge Plan: [place] Barriers to Discharge: Continued Medical Work up   Patient Goals and CMS Choice     Choice offered to / list presented to : Patient  Expected Discharge Plan and Services Expected Discharge Plan: [place]   Discharge Planning Services: CM Consult                     DME Arranged: Walker rolling, High strength lightweight manual wheelchair with seat cushion DME Agency: [employer] Date DME Agency Contacted: 09/21/20 Time DME Agency Contacted: [phone] Representative spoke with at DME Agency: Freda Munro HH Arranged: PT, OT [place] Agency: [place] Date [place]: 09/21/20 Time [place]: [phone] Representative spoke with at [place] Arrangements/Services       Do you feel safe going back to the place where you live?: Yes               Activities of Daily Living Home  Assistive Devices/Equipment: Environmental consultant (specify type) ADL Screening (condition at time of admission) Patient's cognitive ability adequate to safely complete daily activities?: Yes Is the patient deaf or have difficulty hearing?: No Does the patient have difficulty seeing, even when wearing glasses/contacts?: No Does the patient have difficulty concentrating, remembering, or making decisions?: No Patient able to express need for assistance with ADLs?: No Does the patient have difficulty dressing or bathing?: No Independently performs ADLs?: Yes (appropriate for developmental age) Does the patient have difficulty walking or climbing stairs?: Yes Weakness of Legs: Both Weakness of Arms/Hands: None  Permission Sought/Granted                  Emotional Assessment       Orientation: : Oriented to Self, Oriented to Place, Oriented to  Time, Oriented to Situation   Psych Involvement: No (comment)  Admission diagnosis:  Thoracic spine tumor [D49.2] Patient Active Problem List   Diagnosis Date Noted   Bone metastases ([place])    Cord compression [facility])    Thoracic spine tumor 09/17/2020   Hyponatremia 09/14/2018   Hypokalemia 09/14/2018   ARF (acute renal failure) ([place]) 09/14/2018   Acute lower UTI 09/14/2018   S/P right THA, 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

## 2020-09-21 NOTE — Plan of Care (Signed)
Patient OOB ambulating in room. C/o pain, given PRN pain medication. Tolerating diet. Drsg intact-Honey comb drsg removed per MD orders. Steri strips in place. VSS. Plan to d/c tomorrow.  Problem: Clinical Measurements: Goal: Ability to maintain clinical measurements within normal limits will improve Outcome: Progressing Goal: Will remain free from infection Outcome: Progressing Goal: Diagnostic test results will improve Outcome: Progressing Goal: Respiratory complications will improve Outcome: Progressing Goal: Cardiovascular complication will be avoided Outcome: Progressing   Problem: Elimination: Goal: Will not experience complications related to bowel motility Outcome: Progressing Goal: Will not experience complications related to urinary retention Outcome: Progressing   Problem: Pain Managment: Goal: General experience of comfort will improve Outcome: Progressing   Problem: Safety: Goal: Ability to remain free from injury will improve Outcome: Progressing   Problem: Skin Integrity: Goal: Risk for impaired skin integrity will decrease Outcome: Progressing

## 2020-09-21 NOTE — Progress Notes (Signed)
Subjective: The patient is alert and pleasant.  His back is getting better.  We discussed skilled nursing skilled placement.  He was not interested.  He wants to go home.  Objective: Vital signs in last 24 hours: Temp:  [98.1 F (36.7 C)-98.7 F (37.1 C)] 98.3 F (36.8 C) (09/07 1000) Pulse Rate:  [76-80] 76 (09/07 1000) Resp:  [17-20] 20 (09/07 1000) BP: (148-166)/(73-82) 166/73 (09/07 1000) SpO2:  [92 %-93 %] 92 % (09/07 1000) Estimated body mass index is 22.35 kg/m as calculated from the following:   Height as of this encounter: '5\' 10"'$  (1.778 m).   Weight as of this encounter: 70.7 kg.   Intake/Output from previous day: 09/06 0701 - 09/07 0700 In: -  Out: 100 [Urine:100] Intake/Output this shift: Total I/O In: 480 [P.O.:480] Out: -   Physical exam the patient is alert and oriented.  His strength is normal.  Lab Results: Recent Labs    09/19/20 0442  WBC 14.6*  HGB 8.8*  HCT 27.8*  PLT 241   BMET Recent Labs    09/19/20 0442  NA 135  K 4.6  CL 104  CO2 25  GLUCOSE 160*  BUN 9  CREATININE 1.00  CALCIUM 8.5*    Studies/Results: No results found.  Assessment/Plan: Postop day #3: The patient is doing well we will plan to send him home tomorrow.  LOS: 4 days     Ophelia Charter 09/21/2020, 3:50 PM     Patient ID: Marcus [name], male   DOB: May 24, 1945, 75 y.o.   MRN: [id number]

## 2020-09-21 NOTE — Progress Notes (Signed)
OT treatment note: Patient supine in bed and agreeable to OT session. Reviewed precautions and safety, patient initially recalling 2/3 but at end of session able to recall 3/3 with increased time. Requires up to mod assist cueing functionally to adhere to precautions.  Assist required to don back brace.  Completing transfers with min guard, LB dressing after education on AE (reacher, sock aide) with min assist but cueing to problem solving, sequencing and recall.  Continue to recommend [place] services at discharge, recommend assist for Adls and mobility at this time as well.  Pt reports his roommate can assist.  Will follow acutely.    09/21/20 1100  OT Visit Information  Last OT Received On 09/21/20  Assistance Needed +1  History of Present Illness 75 year old male s/p T5-T6 laminectomy and resection of thoracic spine tumor. PMH of prostate cancer, acute renal failure, and R THA.  Precautions  Precautions Fall;Back  Precaution Booklet Issued Yes (comment)  Precaution Comments educated on brace mgmt and precatuions  Required Braces or Orthoses Spinal Brace  Spinal Brace TLSO;Applied in sitting position  Pain Assessment  Pain Assessment Faces  Faces Pain Scale 4  Pain Location Back  Pain Descriptors / Indicators Grimacing;Aching  Pain Intervention(s) Limited activity within patient's tolerance;Monitored during session;Repositioned  Cognition  Arousal/Alertness Awake/alert  Behavior During Therapy WFL for tasks assessed/performed  Overall Cognitive Status Impaired/Different from baseline  Area of Impairment Memory;Safety/judgement;Problem solving  Memory Decreased short-term memory;Decreased recall of precautions  Safety/Judgement Decreased awareness of safety;Decreased awareness of deficits  Problem Solving Slow processing;Difficulty sequencing;Requires verbal cues;Decreased initiation  General Comments pt requires cueing to recall and adhere to precautions during session, at end of session  able to recall 3/3 with increased time but mod cueing to adhere functionally.  Decreased problem solving and safety.  ADL  Overall ADL's  Needs assistance/impaired  Upper Body Dressing  Minimal assistance;Sitting  Upper Body Dressing Details (indicate cue type and reason) requires assist for brace mgmt  Lower Body Dressing Minimal assistance;Sit to/from stand  Lower Body Dressing Details (indicate cue type and reason) further education on AE, min assist for verbal cueing to sequence, problem solve and adhere to precautions; min guard sit to stand  Toilet Transfer Min guard;Ambulation;RW  Toilet Transfer Details (indicate cue type and reason) simulated to recliner, cueing for safety and hand placement on RW  Functional mobility during ADLs Min guard;Rolling walker;Cueing for safety  General ADL Comments pt limited by impaired cognition, balance, pain and safety  Bed Mobility  Overal bed mobility Needs Assistance  Bed Mobility Rolling;Sidelying to Sit  Rolling Supervision  Sidelying to sit Supervision  General bed mobility comments pt requires cueing to recall and sequence log roll technique, increased time but no physical assistance  Balance  Overall balance assessment Mild deficits observed, not formally tested  OT - End of Session  Equipment Utilized During Treatment Back brace  Activity Tolerance Patient tolerated treatment well  Patient left in chair;with call bell/phone within reach;with chair alarm set  Nurse Communication Mobility status  OT Assessment/Plan  OT Plan Discharge plan remains appropriate;Frequency remains appropriate  OT Visit Diagnosis Unsteadiness on feet (R26.81);Other abnormalities of gait and mobility (R26.89);Muscle weakness (generalized) (M62.81);Pain  Pain - Right/Left Left  Pain - part of body  (back)  OT Frequency (ACUTE ONLY) Min 2X/week  Follow Up Recommendations Home health OT;Supervision - Intermittent (assist with ADLs and mobility)  OT Equipment 3  in 1 bedside commode  AM-PAC OT "6 Clicks" Daily Activity Outcome  Measure (Version 2)  Help from another person eating meals? 4  Help from another person taking care of personal grooming? 3  Help from another person toileting, which includes using toliet, bedpan, or urinal? 3  Help from another person bathing (including washing, rinsing, drying)? 3  Help from another person to put on and taking off regular upper body clothing? 3  Help from another person to put on and taking off regular lower body clothing? 3  6 Click Score 19  Progressive Mobility  Mobility Ambulated with assistance in room  OT Goal Progression  Progress towards OT goals Progressing toward goals  Acute Rehab OT Goals  Patient Stated Goal To have less pain and go home  OT Goal Formulation With patient  OT Time Calculation  OT Start Time (ACUTE ONLY) 1122  OT Stop Time (ACUTE ONLY) 1157  OT Time Calculation (min) 35 min  OT General Charges  $OT Visit 1 Visit  OT Treatments  $Self Care/Home Management  23-37 mins   Jolaine Artist, OT Acute Rehabilitation Services Pager [phone] Office [phone]

## 2020-09-22 LAB — SURGICAL PATHOLOGY

## 2020-09-22 MED ORDER — CYCLOBENZAPRINE HCL 10 MG PO TABS: 10.0000 mg | ORAL_TABLET | Freq: Three times a day (TID) | ORAL | 0 refills | Status: DC | PRN

## 2020-09-22 MED ORDER — DOCUSATE SODIUM 100 MG PO CAPS: 100.0000 mg | ORAL_CAPSULE | Freq: Two times a day (BID) | ORAL | 0 refills | Status: DC

## 2020-09-22 MED ORDER — OXYCODONE HCL 10 MG PO TABS: 10.0000 mg | ORAL_TABLET | ORAL | 0 refills | Status: DC | PRN

## 2020-09-22 NOTE — Discharge Instructions (Signed)
Wound Care Keep incision covered and dry for two days.    Do not put any creams, lotions, or ointments on incision. Leave steri-strips on back.  They will fall off by themselves. Activity Walk each and every day, increasing distance each day. No lifting greater than 5 lbs.  No driving for 2 weeks; may ride as a passenger locally.  Diet Resume your normal diet.  Return to Work Will be discussed at your follow up appointment. Call Your Doctor If Any of These Occur Redness, drainage, or swelling at the wound.  Temperature greater than 101 degrees. Severe pain not relieved by pain medication. Incision starts to come apart. Follow Up Appt Call today for appointment in 1-2 weeks ([phone]) or for problems.  If you have any hardware placed in your spine, you will need an x-ray before your appointment.  

## 2020-09-22 NOTE — Discharge Summary (Signed)
Physician Discharge Summary     Providing Compassionate, Quality Care - Together   Patient ID: Marcus [name] MRN: [id number] DOB/AGE: 73-Apr-1947 73 y.o.  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 Arnoldo Morale on 09/18/2020. He was admitted to [facility] following recovery from anesthesia in the PACU. His postoperative course has been uncomplicated. He has worked with both physical and occupational therapies who feel the patient is ready for discharge home with home health PT and OT. He is ambulating with the aid of a walker and assistance. He is tolerating a normal diet. He is not having any bowel or bladder dysfunction. His pain is well-controlled with oral pain medication. He is ready for discharge home with home health.   Consults: rehabilitation medicine  Significant Diagnostic Studies: radiology: DG Lumbar Spine 2-3 Views  Result Date: 09/18/2020 CLINICAL DATA:  Elective surgery. EXAM: LUMBAR SPINE - 2-3 VIEW COMPARISON:  Prior thoracic spine imaging. FINDINGS: Two lateral coned spot views of the spine obtained in the operating room. Pedicle screws are seen at 2 contiguous levels as well as the more superior level. Levels are not well delineated on these coned operative views. Fluoroscopy time 1 minute 6 seconds. IMPRESSION: Procedural fluoroscopy for spine surgery, recommend correlation with operative report for details. Electronically Signed   By: Keith Rake M.D.   On: 09/18/2020 18:36   DG C-Arm 1-60 Min-No Report  Result Date: 09/18/2020 Fluoroscopy was utilized by the requesting physician.  No radiographic  interpretation.     Treatments: surgery: T5 and T6 laminectomy for resection of extradural tumor; posterior segmental instrumentation from T4 to T8 with globus titanium pedicle screws and rods; posterior lateral arthrodesis at T4-5, T5-6, T6-7, T7-8 with local morselized autograft bone and Zimmer DBM.  Discharge Exam: Blood pressure (!) 159/91, pulse 76, temperature 98.4 F (36.9 C), temperature source Oral, resp. rate 17, height '5\' 10"'$  (1.778 m), weight 70.7 kg, SpO2 95 %.  Alert and oriented x 4 PERRLA CN II-XII grossly intact MAE, Strength and sensation grossly normal Dressing is dry and intact, with small dried sanguinous drainage   Disposition: Discharge disposition: 06-Home-Health Care Svc       Discharge Instructions     Face-to-face encounter (required for Medicare/Medicaid patients)   Complete by: As directed    I Patricia Nettle certify that this patient is under my care and that I, or a nurse practitioner or physician's assistant working with me, had a face-to-face encounter that meets the physician face-to-face encounter requirements with this patient on 09/22/2020. The encounter with the patient was in whole, or in part for the following medical condition(s) which is the primary reason for home health care (List medical condition): Thoracic spine tumor   The encounter with the patient was in whole, or in part, for the following medical condition, which is the primary reason for home health care: Thoracic spine tumor   I certify that, based on my findings, the following services are medically necessary home health services: Physical therapy   Reason for Medically Necessary Home Health Services: Therapy- Therapeutic Exercises to Increase Strength and Endurance   My clinical findings support the need for the  above services: Unable to leave home safely without assistance and/or assistive device   Further, I certify that my clinical findings support that this patient is  homebound due to: Unable to leave home safely without assistance   For home use only DME 4 wheeled rolling walker with seat   Complete by: As directed    Patient needs a walker to treat with the following condition: Cord compression ([place])   For home use only DME standard manual wheelchair with seat cushion   Complete by: As directed    Patient suffers from a thoracic spinal tumor with cord compression which impairs their ability to perform daily activities like bathing, dressing, and grooming in the home.  A cane, crutch, or walker will not resolve issue with performing activities of daily living. A wheelchair will allow patient to safely perform daily activities. Patient can safely propel the wheelchair in the home or has a caregiver who can provide assistance. Length of need 6 months . Accessories: elevating leg rests (ELRs), wheel locks, extensions and anti-tippers.   Home Health   Complete by: As directed    To provide the following care/treatments:  PT OT        Allergies as of 09/22/2020   No Known Allergies      Medication List     STOP taking these medications    ibuprofen 600 MG tablet Commonly known as: ADVIL       TAKE these medications    acetaminophen 325 MG tablet Commonly known as: Tylenol Take 2 tablets (650 mg total) by mouth every 6 (six) hours as needed for up to 30 doses for mild pain or moderate pain.   amLODipine 10 MG tablet Commonly known as: NORVASC Take 1 tablet (10 mg total) by mouth daily.   atorvastatin 40 MG tablet Commonly known as: LIPITOR Take 40 mg by mouth every evening.   cyclobenzaprine 10 MG tablet Commonly known as: FLEXERIL Take 1 tablet (10 mg total) by mouth 3 (three) times daily as needed for muscle spasms.   docusate sodium 100 MG capsule Commonly known as: COLACE Take 1 capsule (100 mg total) by mouth 2 (two) times daily.   ferrous sulfate 325 (65 FE) MG tablet Commonly known as: FerrouSul Take 1 tablet (325 mg total)  by mouth daily.   Oxycodone HCl 10 MG Tabs Take 1 tablet (10 mg total) by mouth every 4 (four) hours as needed for severe pain ((score 7 to 10)).               Durable Medical Equipment  (From admission, onward)           Start     Ordered   09/22/20 1113  For home use only DME 3 n 1  Once        09/22/20 1114   09/21/20 0826  For home use only DME Walker rolling  Once       Question Answer Comment  Walker: With 5 Inch Wheels   Patient needs a walker to treat with the following condition Weakness      09/21/20 0826   09/21/20 0826  For home use only DME lightweight manual wheelchair with seat cushion  Once       Comments: Patient suffers from thoracic spine tumor which impairs their ability to perform daily activities like dressing in the home.  A walker will not resolve  issue with performing activities of daily living. A wheelchair will allow patient to safely perform  daily activities. Patient is not able to propel themselves in the home using a standard weight wheelchair due to general weakness. Patient can self propel in the lightweight wheelchair. Length of need 12 months . Accessories: elevating leg rests (ELRs), wheel locks, extensions and anti-tippers.   09/21/20 [id number]            Follow-up Information     [name], MD. Schedule an appointment as soon as possible for a visit.   Specialty: Family Medicine Contact information: [place] [address] [phone]         Care, [facility] Follow up.   Specialty: Home Health Services Why: [facility] will provide your home health PT AND OT services. Start of care will be within 48 hours post hospital discharge Contact information: [place] [address] [phone]         Newman Pies, MD. Schedule an appointment as soon as possible for a visit in 3 week(s).   Specialty: Neurosurgery Contact information: [address] [place] [address] [phone]                 Signed: Viona Gilmore, DNP, AGNP-C Nurse Practitioner  [facility] [place] [address], [place], [place] [address] P: [phone]    F: [phone]  09/22/2020, 11:15 AM

## 2020-09-22 NOTE — Progress Notes (Signed)
Discharge:  Pt d/c from room via wheelchair, Family member with the pt.  Discharge instructions given to the patient and family members.  No questions from pt,  reintegrated to the pt to go to pick up his meds at the pharmacy and to attend his return appt for follow up after his procedure.  Patient verbalizes understanding.  Pt dressed in street clothes and left with discharge papers  IV d/ced,

## 2020-09-22 NOTE — Progress Notes (Signed)
Physical Therapy Treatment Patient Details Name: Marcus [name] MRN: [id number] DOB: 16-Apr-1945 Today's Date: 09/22/2020    History of Present Illness 75 year old male s/p T5-T6 laminectomy and resection of thoracic spine tumor. PMH of prostate cancer, acute renal failure, and R THA.    PT Comments    Patient received in bed, very pleasant and cooperative today. Session focus on stair practice- able to perform well with general S but needed repeated cues to maintain back precautions in dynamic situations. Continues to tolerate gait progression well but is still limited by pain in L trunk. Left in bed positioned to comfort with all needs met. Progressing really well!     Follow Up Recommendations  Home health PT;Supervision for mobility/OOB     Equipment Recommendations  Rolling walker with 5" wheels;3in1 (PT);Wheelchair (measurements PT);Wheelchair cushion (measurements PT)    Recommendations for Other Services       Precautions / Restrictions Precautions Precautions: Fall;Back Precaution Booklet Issued: Yes (comment) Precaution Comments: handout provided in previous session, with emphasis on brace management and precautions Required Braces or Orthoses: Spinal Brace Spinal Brace: Thoracolumbosacral orthotic Restrictions Weight Bearing Restrictions: No    Mobility  Bed Mobility Overal bed mobility: Needs Assistance Bed Mobility: Sidelying to Sit;Rolling Rolling: Supervision Sidelying to sit: Supervision     Sit to sidelying: Min assist General bed mobility comments: able to get to roll and get to EOB, did need MinA for LE management returning to bed    Transfers Overall transfer level: Needs assistance Equipment used: Rolling walker (2 wheeled) Transfers: Sit to/from Stand Sit to Stand: Min guard         General transfer comment: min guard for safety, VC for maintaining back precautions  Ambulation/Gait Ambulation/Gait assistance: Supervision Gait Distance  (Feet): 50 Feet Assistive device: Rolling walker (2 wheeled) Gait Pattern/deviations: Step-through pattern;Trunk flexed;Decreased stride length Gait velocity: Decreased   General Gait Details: needed repeated cues for upright posture and to avoid twisting spine during functional ambulation, tolerated well but continues to have left sided spine pain   Stairs Stairs: Yes Stairs assistance: Supervision Stair Management: Two rails;Step to pattern;Forwards Number of Stairs: 3 General stair comments: reports he actually does have rails at steps in front; able to perform steps with S and Min cues for sequencing/precautions. Also practiced single step navigation to mimic getting from porch into his home. Needed repeated cues to maintain back precautions   Wheelchair Mobility    Modified Rankin (Stroke Patients Only)       Balance Overall balance assessment: Needs assistance Sitting-balance support: Feet supported;No upper extremity supported Sitting balance-Leahy Scale: Good Sitting balance - Comments: Able to maintain sitting balance without UE support while taking off brace   Standing balance support: During functional activity;Bilateral upper extremity supported Standing balance-Leahy Scale: Fair Standing balance comment: Able to maintain static balance intermittently without BUE support in bathroom and in hall. Requires BUE support on RW when ambulating.                            Cognition Arousal/Alertness: Awake/alert Behavior During Therapy: WFL for tasks assessed/performed Overall Cognitive Status: Within Functional Limits for tasks assessed Area of Impairment: Memory;Safety/judgement;Problem solving                     Memory: Decreased short-term memory;Decreased recall of precautions   Safety/Judgement: Decreased awareness of safety;Decreased awareness of deficits   Problem Solving: Slow processing;Difficulty sequencing;Requires verbal  cues;Decreased initiation General Comments: WFL for simple tasks, did need cues on maintaining back precautions dynamically      Exercises      General Comments        Pertinent Vitals/Pain Pain Assessment: Faces Faces Pain Scale: Hurts a little bit Pain Location: L side of thoracic spine Pain Descriptors / Indicators: Discomfort;Grimacing Pain Intervention(s): Limited activity within patient's tolerance;Monitored during session;Repositioned    Home Living                      Prior Function            PT Goals (current goals can now be found in the care plan section) Acute Rehab PT Goals Patient Stated Goal: To have less pain and go home PT Goal Formulation: With patient Time For Goal Achievement: 10/03/20 Potential to Achieve Goals: Fair Progress towards PT goals: Progressing toward goals    Frequency    Min 5X/week      PT Plan Current plan remains appropriate    Co-evaluation              AM-PAC PT "6 Clicks" Mobility   Outcome Measure  Help needed turning from your back to your side while in a flat bed without using bedrails?: A Little Help needed moving from lying on your back to sitting on the side of a flat bed without using bedrails?: A Little Help needed moving to and from a bed to a chair (including a wheelchair)?: A Little Help needed standing up from a chair using your arms (e.g., wheelchair or bedside chair)?: A Little Help needed to walk in hospital room?: A Little Help needed climbing 3-5 steps with a railing? : A Little 6 Click Score: 18    End of Session Equipment Utilized During Treatment: Back brace Activity Tolerance: Patient tolerated treatment well Patient left: in bed;with call bell/phone within reach Nurse Communication: Mobility status PT Visit Diagnosis: Unsteadiness on feet (R26.81);Muscle weakness (generalized) (M62.81);Difficulty in walking, not elsewhere classified (R26.2);Pain Pain - Right/Left:  (back) Pain  - part of body:  (back)     Time: 5583-1674 PT Time Calculation (min) (ACUTE ONLY): 28 min  Charges:  $Gait Training: 8-22 mins $Therapeutic Activity: 8-22 mins                    Windell Norfolk, DPT, PN2   Supplemental Physical Therapist [place]    Pager [phone] Acute Rehab Office [phone]

## 2020-09-25 ENCOUNTER — Encounter (HOSPITAL_COMMUNITY): Payer: Self-pay

## 2020-09-25 ENCOUNTER — Other Ambulatory Visit: Payer: Self-pay

## 2020-09-25 ENCOUNTER — Inpatient Hospital Stay (HOSPITAL_COMMUNITY)
Admission: EM | Admit: 2020-09-25 | Discharge: 2020-10-28 | DRG: 329 | Disposition: A | Discharge status: Skilled Nursing Facility | Payer: Medicare Other | Attending: Family Medicine | Admitting: Family Medicine

## 2020-09-25 DIAGNOSIS — D630 Anemia in neoplastic disease: Secondary | ICD-10-CM | POA: Diagnosis present

## 2020-09-25 DIAGNOSIS — E860 Dehydration: Secondary | ICD-10-CM | POA: Diagnosis not present

## 2020-09-25 DIAGNOSIS — C187 Malignant neoplasm of sigmoid colon: Secondary | ICD-10-CM | POA: Diagnosis present

## 2020-09-25 DIAGNOSIS — E559 Vitamin D deficiency, unspecified: Secondary | ICD-10-CM | POA: Diagnosis present

## 2020-09-25 DIAGNOSIS — K6389 Other specified diseases of intestine: Secondary | ICD-10-CM

## 2020-09-25 DIAGNOSIS — R8271 Bacteriuria: Secondary | ICD-10-CM

## 2020-09-25 DIAGNOSIS — D72829 Elevated white blood cell count, unspecified: Secondary | ICD-10-CM | POA: Diagnosis present

## 2020-09-25 DIAGNOSIS — Z9889 Other specified postprocedural states: Secondary | ICD-10-CM

## 2020-09-25 DIAGNOSIS — Z981 Arthrodesis status: Secondary | ICD-10-CM

## 2020-09-25 DIAGNOSIS — Z800 Family history of malignant neoplasm of digestive organs: Secondary | ICD-10-CM

## 2020-09-25 DIAGNOSIS — Z96641 Presence of right artificial hip joint: Secondary | ICD-10-CM | POA: Diagnosis present

## 2020-09-25 DIAGNOSIS — E538 Deficiency of other specified B group vitamins: Secondary | ICD-10-CM | POA: Diagnosis present

## 2020-09-25 DIAGNOSIS — D500 Iron deficiency anemia secondary to blood loss (chronic): Secondary | ICD-10-CM | POA: Diagnosis present

## 2020-09-25 DIAGNOSIS — D649 Anemia, unspecified: Secondary | ICD-10-CM

## 2020-09-25 DIAGNOSIS — C61 Malignant neoplasm of prostate: Secondary | ICD-10-CM | POA: Diagnosis present

## 2020-09-25 DIAGNOSIS — I951 Orthostatic hypotension: Secondary | ICD-10-CM | POA: Diagnosis not present

## 2020-09-25 DIAGNOSIS — E871 Hypo-osmolality and hyponatremia: Secondary | ICD-10-CM | POA: Diagnosis present

## 2020-09-25 DIAGNOSIS — Z8546 Personal history of malignant neoplasm of prostate: Secondary | ICD-10-CM

## 2020-09-25 DIAGNOSIS — C787 Secondary malignant neoplasm of liver and intrahepatic bile duct: Secondary | ICD-10-CM | POA: Diagnosis present

## 2020-09-25 DIAGNOSIS — I10 Essential (primary) hypertension: Secondary | ICD-10-CM | POA: Diagnosis present

## 2020-09-25 DIAGNOSIS — Z95828 Presence of other vascular implants and grafts: Secondary | ICD-10-CM

## 2020-09-25 DIAGNOSIS — C154 Malignant neoplasm of middle third of esophagus: Principal | ICD-10-CM | POA: Diagnosis present

## 2020-09-25 DIAGNOSIS — K3189 Other diseases of stomach and duodenum: Secondary | ICD-10-CM | POA: Diagnosis present

## 2020-09-25 DIAGNOSIS — K567 Ileus, unspecified: Secondary | ICD-10-CM | POA: Diagnosis not present

## 2020-09-25 DIAGNOSIS — Z6820 Body mass index (BMI) 20.0-20.9, adult: Secondary | ICD-10-CM

## 2020-09-25 DIAGNOSIS — F17210 Nicotine dependence, cigarettes, uncomplicated: Secondary | ICD-10-CM | POA: Diagnosis present

## 2020-09-25 DIAGNOSIS — R630 Anorexia: Secondary | ICD-10-CM

## 2020-09-25 DIAGNOSIS — Z808 Family history of malignant neoplasm of other organs or systems: Secondary | ICD-10-CM

## 2020-09-25 DIAGNOSIS — Z9079 Acquired absence of other genital organ(s): Secondary | ICD-10-CM

## 2020-09-25 DIAGNOSIS — C7989 Secondary malignant neoplasm of other specified sites: Secondary | ICD-10-CM | POA: Diagnosis present

## 2020-09-25 DIAGNOSIS — R8281 Pyuria: Secondary | ICD-10-CM

## 2020-09-25 DIAGNOSIS — Z751 Person awaiting admission to adequate facility elsewhere: Secondary | ICD-10-CM

## 2020-09-25 DIAGNOSIS — Q438 Other specified congenital malformations of intestine: Secondary | ICD-10-CM

## 2020-09-25 DIAGNOSIS — Z23 Encounter for immunization: Secondary | ICD-10-CM

## 2020-09-25 DIAGNOSIS — K922 Gastrointestinal hemorrhage, unspecified: Secondary | ICD-10-CM

## 2020-09-25 DIAGNOSIS — K921 Melena: Secondary | ICD-10-CM

## 2020-09-25 DIAGNOSIS — Z20822 Contact with and (suspected) exposure to covid-19: Secondary | ICD-10-CM | POA: Diagnosis present

## 2020-09-25 DIAGNOSIS — E875 Hyperkalemia: Secondary | ICD-10-CM | POA: Diagnosis not present

## 2020-09-25 DIAGNOSIS — K2289 Other specified disease of esophagus: Secondary | ICD-10-CM

## 2020-09-25 DIAGNOSIS — R109 Unspecified abdominal pain: Secondary | ICD-10-CM

## 2020-09-25 DIAGNOSIS — C7951 Secondary malignant neoplasm of bone: Secondary | ICD-10-CM | POA: Diagnosis present

## 2020-09-25 DIAGNOSIS — C159 Malignant neoplasm of esophagus, unspecified: Secondary | ICD-10-CM | POA: Diagnosis present

## 2020-09-25 DIAGNOSIS — Z7189 Other specified counseling: Secondary | ICD-10-CM

## 2020-09-25 DIAGNOSIS — Z923 Personal history of irradiation: Secondary | ICD-10-CM

## 2020-09-25 DIAGNOSIS — E876 Hypokalemia: Secondary | ICD-10-CM | POA: Diagnosis present

## 2020-09-25 DIAGNOSIS — E43 Unspecified severe protein-calorie malnutrition: Secondary | ICD-10-CM | POA: Diagnosis not present

## 2020-09-25 LAB — IRON AND TIBC
Iron: 14 ug/dL — ABNORMAL LOW (ref 45–182)
Saturation Ratios: 6 % — ABNORMAL LOW (ref 17.9–39.5)
TIBC: 235 ug/dL — ABNORMAL LOW (ref 250–450)
UIBC: 221 ug/dL

## 2020-09-25 LAB — CBC WITH DIFFERENTIAL/PLATELET
Abs Immature Granulocytes: 0.19 10*3/uL — ABNORMAL HIGH (ref 0.00–0.07)
Basophils Absolute: 0 10*3/uL (ref 0.0–0.1)
Basophils Relative: 0 %
Eosinophils Absolute: 0.1 10*3/uL (ref 0.0–0.5)
Eosinophils Relative: 1 %
HCT: 31.4 % — ABNORMAL LOW (ref 39.0–52.0)
Hemoglobin: 9.9 g/dL — ABNORMAL LOW (ref 13.0–17.0)
Immature Granulocytes: 2 %
Lymphocytes Relative: 9 %
Lymphs Abs: 0.8 10*3/uL (ref 0.7–4.0)
MCH: 25.4 pg — ABNORMAL LOW (ref 26.0–34.0)
MCHC: 31.5 g/dL (ref 30.0–36.0)
MCV: 80.5 fL (ref 80.0–100.0)
Monocytes Absolute: 0.8 10*3/uL (ref 0.1–1.0)
Monocytes Relative: 9 %
Neutro Abs: 6.4 10*3/uL (ref 1.7–7.7)
Neutrophils Relative %: 79 %
Platelets: 187 10*3/uL (ref 150–400)
RBC: 3.9 MIL/uL — ABNORMAL LOW (ref 4.22–5.81)
RDW: 18.6 % — ABNORMAL HIGH (ref 11.5–15.5)
WBC: 8.2 10*3/uL (ref 4.0–10.5)
nRBC: 0 % (ref 0.0–0.2)

## 2020-09-25 LAB — COMPREHENSIVE METABOLIC PANEL
ALT: 26 U/L (ref 0–44)
AST: 20 U/L (ref 15–41)
Albumin: 3.1 g/dL — ABNORMAL LOW (ref 3.5–5.0)
Alkaline Phosphatase: 67 U/L (ref 38–126)
Anion gap: 10 (ref 5–15)
BUN: 16 mg/dL (ref 8–23)
CO2: 21 mmol/L — ABNORMAL LOW (ref 22–32)
Calcium: 8.5 mg/dL — ABNORMAL LOW (ref 8.9–10.3)
Chloride: 107 mmol/L (ref 98–111)
Creatinine, Ser: 0.96 mg/dL (ref 0.61–1.24)
GFR, Estimated: >60 mL/min (ref >60)
Glucose, Bld: 91 mg/dL (ref 70–99)
Potassium: 3.6 mmol/L (ref 3.5–5.1)
Sodium: 138 mmol/L (ref 135–145)
Total Bilirubin: 0.9 mg/dL (ref 0.3–1.2)
Total Protein: 6.3 g/dL — ABNORMAL LOW (ref 6.5–8.1)

## 2020-09-25 LAB — URINALYSIS, ROUTINE W REFLEX MICROSCOPIC
Bilirubin Urine: NEGATIVE
Glucose, UA: NEGATIVE mg/dL
Nitrite: POSITIVE — AB
Specific Gravity, Urine: 1.02 (ref 1.005–1.030)
WBC, UA: >50 WBC/hpf — ABNORMAL HIGH (ref 0–5)
pH: 6 (ref 5.0–8.0)

## 2020-09-25 LAB — VITAMIN B12: Vitamin B-12: 140 pg/mL — ABNORMAL LOW (ref 180–914)

## 2020-09-25 LAB — POC OCCULT BLOOD, ED: Fecal Occult Bld: POSITIVE — AB

## 2020-09-25 LAB — FERRITIN: Ferritin: 374 ng/mL — ABNORMAL HIGH (ref 24–336)

## 2020-09-25 LAB — RETICULOCYTES
Immature Retic Fract: 15.4 % (ref 2.3–15.9)
RBC.: 3.72 MIL/uL — ABNORMAL LOW (ref 4.22–5.81)
Retic Count, Absolute: 82.2 10*3/uL (ref 19.0–186.0)
Retic Ct Pct: 2.2 % (ref 0.4–3.1)

## 2020-09-25 LAB — HEMOGLOBIN AND HEMATOCRIT, BLOOD
HCT: 29.6 % — ABNORMAL LOW (ref 39.0–52.0)
HCT: 25.7 % — ABNORMAL LOW (ref 39.0–52.0)
Hemoglobin: 9.5 g/dL — ABNORMAL LOW (ref 13.0–17.0)
Hemoglobin: 8.5 g/dL — ABNORMAL LOW (ref 13.0–17.0)

## 2020-09-25 LAB — FOLATE: Folate: 7.5 ng/mL (ref >5.9)

## 2020-09-25 LAB — PROTIME-INR
INR: 1.1 (ref 0.8–1.2)
Prothrombin Time: 14.2 seconds (ref 11.4–15.2)

## 2020-09-25 LAB — APTT: aPTT: 29 seconds (ref 24–36)

## 2020-09-25 MED ORDER — ACETAMINOPHEN 325 MG PO TABS: 650.0000 mg | ORAL_TABLET | Freq: Four times a day (QID) | ORAL | Status: DC | PRN

## 2020-09-25 MED ORDER — ACETAMINOPHEN 650 MG RE SUPP: 650.0000 mg | Freq: Four times a day (QID) | RECTAL | Status: DC | PRN

## 2020-09-25 MED ORDER — OXYCODONE HCL 5 MG PO TABS: 5.0000 mg | ORAL_TABLET | ORAL | Status: DC | PRN

## 2020-09-25 MED ORDER — PANTOPRAZOLE SODIUM 40 MG IV SOLR
40.0000 mg | Freq: Once | INTRAVENOUS | Status: AC
  Administered 2020-09-25: 40 mg via INTRAVENOUS
  Filled 2020-09-25: qty 40

## 2020-09-25 MED ORDER — MORPHINE SULFATE (PF) 4 MG/ML IV SOLN
4.0000 mg | Freq: Once | INTRAVENOUS | Status: AC
  Administered 2020-09-25: 4 mg via INTRAVENOUS
  Filled 2020-09-25: qty 1

## 2020-09-25 MED ORDER — PANTOPRAZOLE SODIUM 40 MG IV SOLR
40.0000 mg | Freq: Two times a day (BID) | INTRAVENOUS | Status: DC
  Administered 2020-09-25 – 2020-09-29 (×9): 40 mg via INTRAVENOUS
  Filled 2020-09-25 (×9): qty 40

## 2020-09-25 MED ORDER — ONDANSETRON HCL 4 MG PO TABS
4.0000 mg | ORAL_TABLET | Freq: Four times a day (QID) | ORAL | Status: DC | PRN
  Administered 2020-10-02: 4 mg via ORAL
  Filled 2020-09-25: qty 1

## 2020-09-25 MED ORDER — SODIUM CHLORIDE 0.9 % IV BOLUS
1000.0000 mL | Freq: Once | INTRAVENOUS | Status: AC
  Administered 2020-09-25: 1000 mL via INTRAVENOUS

## 2020-09-25 MED ORDER — BOOST / RESOURCE BREEZE PO LIQD CUSTOM
1.0000 | Freq: Three times a day (TID) | ORAL | Status: DC
  Administered 2020-09-26 – 2020-10-10 (×20): 1 via ORAL

## 2020-09-25 MED ORDER — ONDANSETRON HCL 4 MG/2ML IJ SOLN
4.0000 mg | Freq: Four times a day (QID) | INTRAMUSCULAR | Status: DC | PRN
  Administered 2020-10-03 – 2020-10-07 (×4): 4 mg via INTRAVENOUS
  Filled 2020-09-25 (×4): qty 2

## 2020-09-25 MED ORDER — HYDROMORPHONE HCL 1 MG/ML IJ SOLN: 0.5000 mg | INTRAMUSCULAR | Status: DC | PRN

## 2020-09-25 NOTE — ED Provider Notes (Signed)
[facility] DEPT Provider Note   CSN: [id number] Arrival date & time: 09/25/20  0554     History Chief Complaint  Patient presents with   Back Pain    Marcus [name] is a 75 y.o. male PMH Paget's disease of the bone and prostate cx s/p T5-T6 laminectomy and resection of a thoracic spine tumor.  Patient reports that he lives with 2 friends and they were concerned because he is not eating as much so they called EMS.  Patient reports that he believes he would be eating more if it did not hurt so much to ambulate to the kitchen to cook his food.  He admits that he was supposed to get in touch with home health care within 2 days of his discharge however he does not have a phone at the moment and was unable to call.  He states "if I had 1 of those TV trays in my room I would have an easier time eating."  Patient reports that his roommate owns a home and is afraid of bugs and will not allow him to eat in his room.  Patient ambulatory with a walker and able to and go to the kitchen and bathroom with assistance.  Reports that he understands he is going to be in pain for "some time" due to his surgery however he does not believe that his remains understand this.  No numbness or tingling.  No bowel/bladder dysfunction however patient does report that since he has returned from the hospital he is noted blood in his urine and "black stools with blood in." Denies any NVDC. Denies chest pain, palpitations, shortness of breath however does state that he becomes dizzy when he stands up to ambulate.  He believes he is staying hydrated.  No urinary symptoms.    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   Hyponatremia 09/14/2018   Hypokalemia 09/14/2018   ARF (acute renal failure) ([place]) 09/14/2018   Acute lower UTI 09/14/2018   S/P right THA, 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   Prostate cancer ([place]) 09/04/2013    Past Surgical History:  Procedure Laterality Date   LAMINECTOMY N/A 09/18/2020   Procedure: THORACIC LAMINECTOMY FOR TUMOR;  Surgeon: Newman Pies, MD;  Location: [facility];  Service: Neurosurgery;  Laterality: N/A;   LYMPHADENECTOMY Bilateral 09/04/2013   Procedure: PELVIC LYMPH NODE DISSECTION;  Surgeon: Alexis Frock, MD;  [facility];  Service: Urology;  Laterality: Bilateral;   ROBOT ASSISTED LAPAROSCOPIC RADICAL PROSTATECTOMY N/A 09/04/2013   Procedure: ROBOTIC ASSISTED LAPAROSCOPIC RADICAL PROSTATECTOMY, INDOCYANINE GREEN DYE;  Surgeon: Alexis Frock, MD;  [facility];  Service: Urology;  Laterality: N/A;   TOTAL HIP ARTHROPLASTY Right 05/15/2016   Procedure: RIGHT TOTAL HIP ARTHROPLASTY ANTERIOR APPROACH;  Surgeon: Paralee Cancel, MD;  [facility];  Service: Orthopedics;  Laterality: Right;   WRIST  GANGLION EXCISION         Family History  Problem Relation Age of Onset   Seizures Mother    Cancer Father        throat cancer    Social History   Tobacco Use   Smoking status: Every Day    Packs/day: 0.25    Types: Cigarettes   Smokeless tobacco: Never  Vaping Use   Vaping Use: Never used  Substance Use Topics   Alcohol use: Not Currently    Comment: 2- 40 ounce beers per week   Drug use: No    Home Medications Prior to Admission medications   Medication Sig Start Date End Date Taking? Authorizing Provider  acetaminophen (TYLENOL) 325 MG tablet Take 2 tablets (650 mg total) by mouth every 6 (six) hours as needed for up to  30 doses for mild pain or moderate pain. 08/31/20   Wyvonnia Dusky, MD  amLODipine (NORVASC) 10 MG tablet Take 1 tablet (10 mg total) by mouth daily. Patient not taking: Reported on 09/17/2020 09/17/18 11/16/18  Flora Lipps, MD  atorvastatin (LIPITOR) 40 MG tablet Take 40 mg by mouth every evening. Patient not taking: No sig reported 04/18/16   [provider]  cyclobenzaprine (FLEXERIL) 10 MG tablet Take 1 tablet (10 mg total) by mouth 3 (three) times daily as needed for muscle spasms. 09/22/20   Viona Gilmore D, NP  docusate sodium (COLACE) 100 MG capsule Take 1 capsule (100 mg total) by mouth 2 (two) times daily. 09/22/20   Viona Gilmore D, NP  ferrous sulfate (FERROUSUL) 325 (65 FE) MG tablet Take 1 tablet (325 mg total) by mouth daily. Patient not taking: No sig reported 09/17/18   Pokhrel, Corrie Mckusick, MD  oxyCODONE 10 MG TABS Take 1 tablet (10 mg total) by mouth every 4 (four) hours as needed for severe pain ((score 7 to 10)). 09/22/20   Viona Gilmore D, NP    Allergies    Patient has no known allergies.  Review of Systems   Review of Systems  Constitutional:  Negative for chills and fever.  Eyes:  Negative for pain and visual disturbance.  Respiratory:  Negative for cough and shortness of breath.   Cardiovascular:  Negative for chest pain and palpitations.  Gastrointestinal:  Positive for blood in stool. Negative for abdominal pain, constipation, diarrhea and vomiting.  Genitourinary:  Negative for dysuria and hematuria.  Musculoskeletal:  Positive for back pain. Negative for arthralgias, neck pain and neck stiffness.  Skin:  Positive for wound. Negative for color change and rash.  Neurological:  Positive for dizziness and weakness. Negative for seizures, syncope, light-headedness, numbness and headaches.  Psychiatric/Behavioral:  Negative for agitation and confusion. The patient is not nervous/anxious.   All other systems reviewed and are negative.  Physical Exam Updated Vital  Signs BP 115/66 (BP Location: Right Arm)   Pulse 89   Resp 17   Ht '5\' 10"'$  (1.778 m)   Wt 70.7 kg   SpO2 100%   BMI 22.35 kg/m   Physical Exam Vitals and nursing note reviewed.  Constitutional:      General: He is not in acute distress.    Appearance: Normal appearance.  HENT:     Head: Normocephalic and atraumatic.     Mouth/Throat:     Mouth: Mucous membranes are moist.     Pharynx: Oropharynx is clear.  Eyes:     General: No scleral icterus.    Extraocular Movements: Extraocular movements intact.  Conjunctiva/sclera: Conjunctivae normal.     Pupils: Pupils are equal, round, and reactive to light.  Cardiovascular:     Rate and Rhythm: Normal rate and regular rhythm.  Pulmonary:     Effort: Pulmonary effort is normal. No respiratory distress.     Breath sounds: Normal breath sounds.  Abdominal:     General: Abdomen is flat.     Palpations: Abdomen is soft.  Genitourinary:    Prostate: Normal.     Rectum: Normal.     Comments: No signs of anterior or exterior hemorrhoids Musculoskeletal:        General: Tenderness present. No deformity or signs of injury. Normal range of motion.     Right lower leg: No edema.     Left lower leg: No edema.  Skin:    General: Skin is warm and dry.     Findings: Lesion present. No rash.  Neurological:     Mental Status: He is alert.     Cranial Nerves: No cranial nerve deficit.     Motor: No weakness.  Psychiatric:        Mood and Affect: Mood normal.        Behavior: Behavior normal.    ED Results / Procedures / Treatments   Labs (all labs ordered are listed, but only abnormal results are displayed) Labs Reviewed  URINALYSIS, ROUTINE W REFLEX MICROSCOPIC - Abnormal; Notable for the following components:      Result Value   Color, Urine YELLOW (*)    APPearance CLOUDY (*)    Hgb urine dipstick MODERATE (*)    Ketones, ur TRACE (*)    Protein, ur TRACE (*)    Nitrite POSITIVE (*)    Leukocytes,Ua SMALL (*)    WBC, UA  >50 (*)    Bacteria, UA MANY (*)    All other components within normal limits  CBC WITH DIFFERENTIAL/PLATELET - Abnormal; Notable for the following components:   RBC 3.90 (*)    Hemoglobin 9.9 (*)    HCT 31.4 (*)    MCH 25.4 (*)    RDW 18.6 (*)    Abs Immature Granulocytes 0.19 (*)    All other components within normal limits  COMPREHENSIVE METABOLIC PANEL - Abnormal; Notable for the following components:   CO2 21 (*)    Calcium 8.5 (*)    Total Protein 6.3 (*)    Albumin 3.1 (*)    All other components within normal limits  POC OCCULT BLOOD, ED - Abnormal; Notable for the following components:   Fecal Occult Bld POSITIVE (*)    All other components within normal limits    EKG None  Radiology No results found.  Procedures Procedures   Medications Ordered in ED Medications  sodium chloride 0.9 % bolus 1,000 mL (0 mLs Intravenous Stopped 09/25/20 1126)  morphine 4 MG/ML injection 4 mg (4 mg Intravenous Given 09/25/20 0738)  pantoprazole (PROTONIX) injection 40 mg (40 mg Intravenous Given 09/25/20 1402)  sodium chloride 0.9 % bolus 1,000 mL (1,000 mLs Intravenous New Bag/Given 09/25/20 1402)    ED Course  I have reviewed the triage vital signs and the nursing notes.  Pertinent labs & imaging results that were available during my care of the patient were reviewed by me and considered in my medical decision making (see chart for details).    MDM Rules/Calculators/A&P CHRIST KIRN is a 75 y.o. male PMH Paget's disease of the bone and prostate cx s/p T5-T6 laminectomy and resection of  a thoracic spine tumor.  Patient reports that he lives with 2 friends and they were concerned because he is not eating as much so they called EMS.  Patient reports that he believes he would be eating more if it did not hurt so much to ambulate to the kitchen to cook his food.  He admits that he was supposed to get in touch with home health care within 2 days of his discharge however he does not  have a phone at the moment and was unable to call.  He states "if I had 1 of those TV trays in my room I would have an easier time eating."   Patient did not have any concerns during his visit today and does not feel as though he needed to come to the hospital.  His urinalysis revealed both an infection and hematuria despite patient denying symptoms. I reassessed the patient after some pain medication and fluids and he reports feeling better.  Rectal exam performed with chaperone.  Patient without external or internal hemorrhoids.  Hemoccult sent and was +.  I consulted GI who agreed to admission and reported that they would see him during his visit.  I discussed the results with the patient and he is surprised however blood that he came in today.  He understands that he will need to be admitted to the hospital.  Social worker has discussed setting up home health with both the patient and the patient's remains.  Please see their note for further information.  Patient reporting difficulty getting a home health nurse because he does not currently have a phone.  I consulted social work and they were able to speak with [facility].  Patient's friend was contacted and agreed to help set up the at home care.  Patient was notified about this and is thankful for the assistance.  Because the patient has been experiencing black stool and visible red blood when he uses the bathroom I plan to admit the patient to the hospital for a GI bleed.  His hemoglobin is 9.9 today which is up from 8.8 last week.  He is not on blood thinners.  He has endorsed some dizziness today which she reports is most common when he is moving from sitting to standing.  I consulted with GI and they were agreeable to the plan of admission with their follow-up.  Patient has been admitted to [facility] who will be taking over patient care at this time.  Please see his note for further information.  Final Clinical Impression(s) / ED Diagnoses Final  diagnoses:  Gastrointestinal hemorrhage with melena    Rx / DC Orders GI consulted and agreeable to admission to the hospital for GI bleeding.  Hospitalist has accepted the admission.    Darliss Ridgel 09/25/20 1537    Regan Lemming, MD 09/25/20 [place]

## 2020-09-25 NOTE — Progress Notes (Signed)
Pt has arrived to Room 1509 from ED. Alert and oriented. Pt without c/o.

## 2020-09-25 NOTE — H&P (Signed)
History and Physical    Marcus [name] [id number] DOB: 1945/09/23 DOA: 09/25/2020  PCP: [name], MD Patient coming from: Home.  Lives with 2 friends.  Using rolling walker after back surgery  Chief Complaint: Back pain, decreased appetite and black stool  HPI: Marcus [name] is a 75 y.o. 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 Arnoldo Morale on 9/4 and anemia returning with upper back pain, decreased appetite and black stool.  Patient reports thoracic back pain since surgery.  Describes the pain as sharp and constant.  He rates his pain 10/10 at its worst.  Improved to 8/10 after IV morphine in ED. Also improved with p.o. oxycodone but not much.  Pain is worse with movement.  He denies radiation, numbness, tingling or weakness in his legs or arms.  He denies bowel or bladder habit change.  He reports poor p.o. intake since he had a back surgery which prompted his friends to call EMS.  He also noted black stool but he denies hematochezia.  Has been taking 2 tablets of ibuprofen daily since surgery.  He is not on blood thinner.  Denies any other over-the-counter pain medication.  Denies taking oral iron.  He denies fever, chills, URI symptoms, cough, chest pain, dyspnea, nausea, vomiting, dysuria, frequency or urgency.  He admits to lightheadedness even before surgery.  Patient was discharged home with home health and rolling walker after recent surgery.  Home health has not started service yet.  Patient denies personal or family history of colon cancer.  He says, he never had EGD and colonoscopy.  He lives with 2 friends.  He smokes about 6 to 7 cigarettes a day.  Admits to drinking about 40 ounce of beer a day.  He denies recreational drug use.  Prefers to remain full code.  In ED, vitals within normal.   CMP without significant finding.  Hgb 9.9 (b/l 10-11 but 8.8 prior to discharge on 9/5).  UA concerning for UTI but has no UTI symptoms.  Hemoccult positive.  [facility] consulted and recommended hospitalist admission to see patient in consult.  ROS All review of system negative except for pertinent positives and negatives as history of present illness above.  PMH 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    Prostate cancer ([facility]) 09/04/13   Adenocarcinoma   PSH Past Surgical History:  Procedure Laterality Date   LAMINECTOMY N/A 09/18/2020   Procedure: THORACIC LAMINECTOMY FOR TUMOR;  Surgeon: Newman Pies, MD;  Location: [facility];  Service: Neurosurgery;  Laterality: N/A;   LYMPHADENECTOMY Bilateral 09/04/2013   Procedure: PELVIC LYMPH NODE DISSECTION;  Surgeon: Alexis Frock, MD;  [facility];  Service: Urology;  Laterality: Bilateral;   ROBOT ASSISTED LAPAROSCOPIC RADICAL PROSTATECTOMY N/A 09/04/2013   Procedure: ROBOTIC ASSISTED LAPAROSCOPIC RADICAL PROSTATECTOMY, INDOCYANINE GREEN DYE;  Surgeon: Alexis Frock, MD;  [facility];  Service: Urology;  Laterality: N/A;   TOTAL HIP ARTHROPLASTY Right 05/15/2016   Procedure: RIGHT TOTAL HIP ARTHROPLASTY ANTERIOR APPROACH;  Surgeon: Paralee Cancel, MD;  [facility];  Service: Orthopedics;  Laterality: Right;   WRIST  GANGLION EXCISION     Fam HX Family History  Problem Relation Age of Onset   Seizures Mother    Cancer Father        throat cancer    Social Hx  reports that he has been smoking cigarettes. He has been smoking an average of .25 packs per day. He has never used smokeless tobacco. He reports that he does not currently use alcohol. He reports that he does not use drugs.  Allergy No Known Allergies Home Meds Prior to Admission medications    Medication Sig Start Date End Date Taking? Authorizing Provider  acetaminophen (TYLENOL) 325 MG tablet Take 2 tablets (650 mg total) by mouth every 6 (six) hours as needed for up to 30 doses for mild pain or moderate pain. 08/31/20  Yes Trifan, Carola Rhine, MD  cyclobenzaprine (FLEXERIL) 10 MG tablet Take 1 tablet (10 mg total) by mouth 3 (three) times daily as needed for muscle spasms. 09/22/20  Yes Viona Gilmore D, NP  IBUPROFEN IB PO Take 1 tablet by mouth daily as needed (pain).   Yes [provider]  oxyCODONE 10 MG TABS Take 1 tablet (10 mg total) by mouth every 4 (four) hours as needed for severe pain ((score 7 to 10)). 09/22/20  Yes Viona Gilmore D, NP  amLODipine (NORVASC) 10 MG tablet Take 1 tablet (10 mg total) by mouth daily. Patient not taking: No sig reported 09/17/18 11/16/18  Pokhrel, Corrie Mckusick, MD  docusate sodium (COLACE) 100 MG capsule Take 1 capsule (100 mg total) by mouth 2 (two) times daily. Patient not taking: No sig reported 09/22/20   Viona Gilmore D, NP  ferrous sulfate (FERROUSUL) 325 (65 FE) MG tablet Take 1 tablet (325 mg total) by mouth daily. Patient not taking: No sig reported 09/17/18   Flora Lipps, MD    Physical Exam: Vitals:   09/25/20 0852 09/25/20 1009 09/25/20 1202 09/25/20 1335  BP: 133/61 139/76 (!) 145/60 (!) 126/52  Pulse: 76 78 73 77  Resp: '18 18 18 16  '$ Temp:      TempSrc:      SpO2: 100% 100% 100% 100%  Weight:      Height:        GENERAL: No acute distress.  Appears well.  HEENT: MMM.  Vision and hearing grossly intact.  NECK: Supple.  No apparent JVD.  RESP:  No IWOB. Good air movement bilaterally. CVS:  RRR. Heart sounds normal.  ABD/GI/GU: Bowel sounds present. Soft. Non tender.  MSK/EXT:  Moves extremities. No apparent deformity or edema.  SKIN: Surgical wound over thoracic spine DCI. NEURO: Awake, alert and oriented appropriately.  Motor, sensory and patellar reflex symmetric. PSYCH: Calm. Normal affect.   Personally Reviewed  Radiological Exams No results found.   Personally Reviewed Labs: CBC: Recent Labs  Lab 09/19/20 0442 09/25/20 1304  WBC 14.6* 8.2  NEUTROABS  --  6.4  HGB 8.8* 9.9*  HCT 27.8* 31.4*  MCV 79.2* 80.5  PLT 241 [id number]   Basic Metabolic Panel: Recent Labs  Lab 09/19/20 0442 09/25/20 1304  NA 135 138  K 4.6 3.6  CL 104 107  CO2 25 21*  GLUCOSE 160* 91  BUN 9 16  CREATININE 1.00 0.96  CALCIUM 8.5* 8.5*   GFR: Estimated Creatinine Clearance: 69.6 mL/min (by C-G formula based on SCr of 0.96 mg/dL). Liver Function Tests: Recent Labs  Lab 09/25/20 1304  AST 20  ALT 26  ALKPHOS 67  BILITOT 0.9  PROT 6.3*  ALBUMIN 3.1*   No results for input(s): LIPASE, AMYLASE in the last 168 hours. No results for input(s): AMMONIA in the last 168 hours. Coagulation Profile: No results for input(s): INR, PROTIME in the last 168 hours. Cardiac Enzymes: No results for input(s): CKTOTAL, CKMB, CKMBINDEX, TROPONINI in the last 168 hours. BNP (last 3 results) No results for input(s): PROBNP in the last 8760 hours. HbA1C: No results for input(s): HGBA1C in the last 72 hours. CBG: No results for input(s): GLUCAP in the last 168 hours. Lipid Profile: No results for input(s): CHOL, HDL, LDLCALC, TRIG, CHOLHDL, LDLDIRECT in the last 72 hours. Thyroid Function Tests: No results for input(s): TSH, T4TOTAL, FREET4, T3FREE, THYROIDAB in the last 72 hours. Anemia Panel: No results for input(s): VITAMINB12, FOLATE, FERRITIN, TIBC, IRON, RETICCTPCT in the last 72 hours. Urine analysis:    Component Value Date/Time   COLORURINE YELLOW (A) 09/25/2020 1126   APPEARANCEUR CLOUDY (A) 09/25/2020 1126   LABSPEC 1.020 09/25/2020 1126   LABSPEC 1.010 07/09/2014 0950   PHURINE 6.0 09/25/2020 [place] 09/25/2020 1126   GLUCOSEU Negative 07/09/2014 0950   HGBUR MODERATE (A) 09/25/2020 1126   BILIRUBINUR NEGATIVE 09/25/2020 1126   BILIRUBINUR Negative 07/09/2014 0950   KETONESUR TRACE  (A) 09/25/2020 1126   PROTEINUR TRACE (A) 09/25/2020 1126   UROBILINOGEN 0.2 07/09/2014 0950   NITRITE POSITIVE (A) 09/25/2020 1126   LEUKOCYTESUR SMALL (A) 09/25/2020 1126   LEUKOCYTESUR Negative 07/09/2014 0950    Sepsis Labs:  None  Personally Reviewed EKG:  EKG on 9/4 with normal sinus rhythm  Assessment/Plan Symptomatic anemia in the setting of possible upper GI bleed-seems to be a slow bleed in the setting of NSAID use after recent surgery.  Hemodynamically stable. Recent Labs    08/05/20 1035 09/15/20 1415 09/17/20 1315 09/19/20 0442 09/25/20 1304  HGB 11.5* 11.0* 10.8* 8.8* 9.9*  -Monitor H&H every 8 hours -Check anemia panel -Verbally consented for blood transfusion if indicated -Secure second PIV line -Start IV Protonix -Clear liquid diet -Per EDP, [facility] will see patient in consult. -Advised to avoid NSAIDs.  Postsurgical back pain-from recent thoracic spine surgery.  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 Arnoldo Morale on 9/4.  Surgical wound appears clean.  No focal neuro symptoms or bowel or bladder habit change. -Pain control with as needed Tylenol, oxycodone and IV Dilaudid. -Outpatient follow-up with neurosurgery and oncology -PT/OT eval  History of prostate cancer with osseous mets s/p radical prostatectomy and radiation in 2015.  Denies LUTS. -Outpatient follow-up  Pyuria/bacteriuria-patient denies UTI symptoms, fever, chills, suprapubic pain or lower back pain. -No indication for treatment.  Decreased appetite-due to opiates?  Due to malignancy? -Consult dietitian  DVT prophylaxis: SCD in the setting of GI bleed  Code Status: Full code Family Communication: None  Disposition Plan: Admit to MedSurg Consults called: [facility] by EDP Admission status: Observation Level of care: Med-Surg   Mercy Riding MD [facility]  If 7PM-7AM, please contact  night-coverage [email or web address]  09/25/2020, 2:11 PM

## 2020-09-25 NOTE — ED Triage Notes (Signed)
Pt BIB [facility] from home for post-op back pain. Pt states he was rx ibuprofen at d/c which is not helping, pt unable to care for self, has not eaten in a few days. VSS. A&Ox4.   BP 146/88 HR 94 SpO2 100% RA CBG 141 Temp 97.3

## 2020-09-25 NOTE — TOC Initial Note (Addendum)
Pa[facility] of Care [facility]) - Initial/Assessment Note    Patient Details  Name: Marcus [name] MRN: [id number] Date of Birth: [birth year]  Transition of Care [facility]) CM/SW Contact:    Verdell Carmine, RN Phone Number: 09/25/2020, 9:04 AM  Clinical Narrative:                   Back surgery. Has walker at home refused wheelchair earlier. When DC on 9/7Patient at that time was set up with [facility] for [place], i, however he has not heard from them because he does not have a phone. Messaged Weekend on call for [facility] to disuccss. The patient will need to have a contact to set up services, faily member, friend, etc.      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   Hyponatremia 09/14/2018   Hypokalemia 09/14/2018   ARF (acute renal failure) ([place]) 09/14/2018   Acute lower UTI 09/14/2018   S/P right THA, 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

## 2020-09-25 NOTE — Plan of Care (Signed)
Pt aware of nutrition needs and physical needs.

## 2020-09-25 NOTE — ED Notes (Signed)
Patient ambulated to bathroom with no assist for bowel movement.

## 2020-09-25 NOTE — Progress Notes (Signed)
Transition of Care [facility]) - Emergency Department Mini Assessment   Patient Details  Name: JAHKYE APA MRN: [id number] Date of Birth: 31-Oct-1945  Transition of Care [facility]) CM/SW Contact:    Leslieann Whisman C Tarpley-Carter, [place] Phone Number: 09/25/2020, 9:52 AM   Clinical Narrative: [facility] consulted with pt about no contact information for [facility] to contact.  Pt currently does not have a phone.  Pt gave CSW permission to contact numbers on contact list to find Josph Macho Lee's/roommates number.  Juvia Aerts Tarpley-Carter, MSW, LCSW-A Pronouns:  She/Her/Hers [facility] Clinical Social Worker Direct Number:  [phone] [email or web address]   ED Mini Assessment: What brought you to the Emergency Department? : Shoulder pain, HH  Barriers to Discharge: No Barriers Identified     Means of departure: Not know  Interventions which prevented an admission or readmission: Other (must enter comment) (Contacting family for a contact number to give to [facility].)    Patient Contact and Communications Key Contact 1: Brother-in-law [phone] Key Contact 2: Justus Memory [phone] Spoke with: Both brother-in-law and Justus Memory Contact Date: 09/25/20,   Contact time: 0928   Call outcome: Justus Memory provided his number for [facility] to contact.  Patient states their goals for this hospitalization and ongoing recovery are:: Pt gave CSW permission to contact Justus Memory. 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

## 2020-09-25 NOTE — ED Notes (Signed)
Occult Card at bedside

## 2020-09-26 ENCOUNTER — Encounter (HOSPITAL_COMMUNITY)
Admission: EM | Disposition: A | Discharge status: Skilled Nursing Facility | Payer: Self-pay | Source: Home / Self Care | Attending: Hospitalist

## 2020-09-26 ENCOUNTER — Observation Stay (HOSPITAL_COMMUNITY): Payer: Medicare Other | Admitting: Registered Nurse

## 2020-09-26 ENCOUNTER — Other Ambulatory Visit: Payer: Self-pay | Admitting: Hematology

## 2020-09-26 ENCOUNTER — Observation Stay (HOSPITAL_COMMUNITY): Payer: Medicare Other

## 2020-09-26 ENCOUNTER — Other Ambulatory Visit: Payer: Self-pay | Admitting: Radiation Oncology

## 2020-09-26 ENCOUNTER — Encounter (HOSPITAL_COMMUNITY): Payer: Self-pay | Admitting: Student

## 2020-09-26 ENCOUNTER — Inpatient Hospital Stay: Payer: Medicare Other

## 2020-09-26 DIAGNOSIS — E538 Deficiency of other specified B group vitamins: Secondary | ICD-10-CM | POA: Diagnosis not present

## 2020-09-26 DIAGNOSIS — C61 Malignant neoplasm of prostate: Secondary | ICD-10-CM

## 2020-09-26 DIAGNOSIS — E876 Hypokalemia: Secondary | ICD-10-CM | POA: Diagnosis not present

## 2020-09-26 DIAGNOSIS — C7951 Secondary malignant neoplasm of bone: Secondary | ICD-10-CM

## 2020-09-26 DIAGNOSIS — K921 Melena: Secondary | ICD-10-CM | POA: Diagnosis not present

## 2020-09-26 HISTORY — PX: ESOPHAGOGASTRODUODENOSCOPY (EGD) WITH PROPOFOL: SHX5813

## 2020-09-26 HISTORY — PX: BIOPSY: SHX5522

## 2020-09-26 LAB — BASIC METABOLIC PANEL
Anion gap: 8 (ref 5–15)
BUN: 9 mg/dL (ref 8–23)
CO2: 22 mmol/L (ref 22–32)
Calcium: 7.8 mg/dL — ABNORMAL LOW (ref 8.9–10.3)
Chloride: 101 mmol/L (ref 98–111)
Creatinine, Ser: 0.9 mg/dL (ref 0.61–1.24)
GFR, Estimated: >60 mL/min (ref >60)
Glucose, Bld: 129 mg/dL — ABNORMAL HIGH (ref 70–99)
Potassium: 3.2 mmol/L — ABNORMAL LOW (ref 3.5–5.1)
Sodium: 131 mmol/L — ABNORMAL LOW (ref 135–145)

## 2020-09-26 LAB — CBC
HCT: 25.1 % — ABNORMAL LOW (ref 39.0–52.0)
Hemoglobin: 8.2 g/dL — ABNORMAL LOW (ref 13.0–17.0)
MCH: 25 pg — ABNORMAL LOW (ref 26.0–34.0)
MCHC: 32.7 g/dL (ref 30.0–36.0)
MCV: 76.5 fL — ABNORMAL LOW (ref 80.0–100.0)
Platelets: 199 10*3/uL (ref 150–400)
RBC: 3.28 MIL/uL — ABNORMAL LOW (ref 4.22–5.81)
RDW: 17.8 % — ABNORMAL HIGH (ref 11.5–15.5)
WBC: 7.2 10*3/uL (ref 4.0–10.5)
nRBC: 0 % (ref 0.0–0.2)

## 2020-09-26 SURGERY — ESOPHAGOGASTRODUODENOSCOPY (EGD) WITH PROPOFOL
Anesthesia: Monitor Anesthesia Care

## 2020-09-26 MED ORDER — PROPOFOL 1000 MG/100ML IV EMUL
INTRAVENOUS | Status: AC
  Filled 2020-09-26: qty 100

## 2020-09-26 MED ORDER — PROPOFOL 500 MG/50ML IV EMUL
INTRAVENOUS | Status: DC | PRN
  Administered 2020-09-26: 130 ug/kg/min via INTRAVENOUS

## 2020-09-26 MED ORDER — LACTATED RINGERS IV SOLN: INTRAVENOUS | Status: DC | PRN

## 2020-09-26 MED ORDER — IOHEXOL 9 MG/ML PO SOLN
ORAL | Status: AC
  Filled 2020-09-26: qty 1000

## 2020-09-26 MED ORDER — IOHEXOL 350 MG/ML SOLN
100.0000 mL | Freq: Once | INTRAVENOUS | Status: AC | PRN
  Administered 2020-09-26: 80 mL via INTRAVENOUS

## 2020-09-26 MED ORDER — POTASSIUM CHLORIDE CRYS ER 20 MEQ PO TBCR
40.0000 meq | EXTENDED_RELEASE_TABLET | Freq: Once | ORAL | Status: AC
  Administered 2020-09-26: 40 meq via ORAL
  Filled 2020-09-26: qty 2

## 2020-09-26 MED ORDER — IOHEXOL 9 MG/ML PO SOLN
500.0000 mL | ORAL | Status: AC
Start: 2020-09-26 — End: 2020-09-26
  Administered 2020-09-26 (×2): 500 mL via ORAL

## 2020-09-26 MED ORDER — CYANOCOBALAMIN 1000 MCG/ML IJ SOLN
1000.0000 ug | Freq: Every day | INTRAMUSCULAR | Status: AC
  Administered 2020-09-26 – 2020-09-28 (×3): 1000 ug via INTRAMUSCULAR
  Filled 2020-09-26 (×3): qty 1

## 2020-09-26 MED ORDER — PROPOFOL 10 MG/ML IV BOLUS
INTRAVENOUS | Status: DC | PRN
  Administered 2020-09-26: 20 mg via INTRAVENOUS

## 2020-09-26 SURGICAL SUPPLY — 15 items

## 2020-09-26 NOTE — Progress Notes (Signed)
Consent obtained for EGD and placed in pts chart.

## 2020-09-26 NOTE — Progress Notes (Signed)
Progress Note    JAN SAWADA  [id number] DOB: 12/04/1945  DOA: 09/25/2020 PCP: [name], MD    Brief Narrative:    Medical records reviewed and are as summarized below:  MARDARIUS DELBUONO is an 75 y.o. 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 Arnoldo Morale on 9/4 and anemia returning with upper back pain, decreased appetite and black stool.  EGD 9/12.    Assessment/Plan:   Active Problems:   GI bleed   Symptomatic anemia in the setting of possible upper GI bleed-seems to be a slow bleed in the setting of NSAID use after recent surgery.  Hemodynamically stable. -Start IV Protonix -EGD 9/12   Postsurgical back pain-from recent thoracic spine surgery.  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 Arnoldo Morale on 9/4.  Surgical wound appears clean.  No focal neuro symptoms or bowel or bladder habit change. -Pain control with as needed Tylenol, oxycodone and IV Dilaudid. -Outpatient follow-up with neurosurgery -PT/OT eval   History of prostate cancer with osseous mets s/p radical prostatectomy and radiation in 2015.  Denies LUTS. -Outpatient follow-up   Pyuria/bacteriuria-patient denies UTI symptoms, fever, chills, suprapubic pain or lower back pain. -No indication for treatment.  Low B12 -replace IM x 3 days for now  Hypokalemia -replete PO  Family Communication/Anticipated D/C date and plan/Code Status   DVT prophylaxis: scd Code Status: Full Code.  Disposition Plan: Status is: Observation  The patient will require care spanning > 2 midnights and should be moved to inpatient because: Inpatient level of care appropriate due to severity of illness  Dispo: The patient is from: Home              Anticipated d/c  is to: Home              Patient currently is not medically stable to d/c.   Difficult to place patient No         Medical Consultants:   GI  Subjective:   No SOB, no CP Nursing reports dizziness when up with therapy this AM  Objective:    Vitals:   09/25/20 1612 09/25/20 2141 09/25/20 2332 09/26/20 0315  BP: (!) 155/80 134/71 119/64 122/65  Pulse: 75 79 84 86  Resp: '16 20 16 20  '$ Temp: 99.1 F (37.3 C) 99.7 F (37.6 C) 99.7 F (37.6 C) 99.6 F (37.6 C)  TempSrc: Oral Oral Oral Oral  SpO2: 98% 100% 98% 98%  Weight:      Height:        Intake/Output Summary (Last 24 hours) at 09/26/2020 1246 Last data filed at 09/26/2020 1025 Gross per 24 hour  Intake --  Output 1 ml  Net -1 ml   Filed Weights   09/25/20 0617  Weight: 70.7 kg    Exam:  General: Appearance:    Well developed, well nourished male in no acute distress     Lungs:     respirations unlabored  Heart:    Normal heart rate.    MS:   All extremities are intact.    Neurologic:   Awake, alert     Data Reviewed:   I have personally reviewed following labs and imaging studies:  Labs: Labs show the following:   Basic Metabolic Panel: Recent  Labs  Lab 09/25/20 1304 09/26/20 0641  NA 138 131*  K 3.6 3.2*  CL 107 101  CO2 21* 22  GLUCOSE 91 129*  BUN 16 9  CREATININE 0.96 0.90  CALCIUM 8.5* 7.8*   GFR Estimated Creatinine Clearance: 74.2 mL/min (by C-G formula based on SCr of 0.9 mg/dL). Liver Function Tests: Recent Labs  Lab 09/25/20 1304  AST 20  ALT 26  ALKPHOS 67  BILITOT 0.9  PROT 6.3*  ALBUMIN 3.1*   No results for input(s): LIPASE, AMYLASE in the last 168 hours. No results for input(s): AMMONIA in the last 168 hours. Coagulation profile Recent Labs  Lab 09/25/20 1554  INR 1.1    CBC: Recent Labs  Lab 09/25/20 1304 09/25/20 1554 09/25/20 2253 09/26/20 0641  WBC 8.2  --   --  7.2  NEUTROABS 6.4  --   --   --   HGB 9.9* 9.5* 8.5* 8.2*  HCT 31.4* 29.6*  25.7* 25.1*  MCV 80.5  --   --  76.5*  PLT 187  --   --  199   Cardiac Enzymes: No results for input(s): CKTOTAL, CKMB, CKMBINDEX, TROPONINI in the last 168 hours. BNP (last 3 results) No results for input(s): PROBNP in the last 8760 hours. CBG: No results for input(s): GLUCAP in the last 168 hours. D-Dimer: No results for input(s): DDIMER in the last 72 hours. Hgb A1c: No results for input(s): HGBA1C in the last 72 hours. Lipid Profile: No results for input(s): CHOL, HDL, LDLCALC, TRIG, CHOLHDL, LDLDIRECT in the last 72 hours. Thyroid function studies: No results for input(s): TSH, T4TOTAL, T3FREE, THYROIDAB in the last 72 hours.  Invalid input(s): FREET3 Anemia work up: Recent Labs    09/25/20 1554  VITAMINB12 140*  FOLATE 7.5  FERRITIN 374*  TIBC 235*  IRON 14*  RETICCTPCT 2.2   Sepsis Labs: Recent Labs  Lab 09/25/20 1304 09/26/20 0641  WBC 8.2 7.2    Microbiology Recent Results (from the past 240 hour(s))  Resp Panel by RT-PCR (Flu A&B, Covid) Nasopharyngeal Swab     Status: None   Collection Time: 09/17/20  4:20 PM   Specimen: Nasopharyngeal Swab; Nasopharyngeal(NP) swabs in vial transport medium  Result Value Ref Range Status   SARS Coronavirus 2 by RT PCR NEGATIVE NEGATIVE Final    Comment: (NOTE) SARS-CoV-2 target nucleic acids are NOT DETECTED.  The SARS-CoV-2 RNA is generally detectable in upper respiratory specimens during the acute phase of infection. The lowest concentration of SARS-CoV-2 viral copies this assay can detect is 138 copies/mL. A negative result does not preclude SARS-Cov-2 infection and should not be used as the sole basis for treatment or other patient management decisions. A negative result may occur with  improper specimen collection/handling, submission of specimen other than nasopharyngeal swab, presence of viral mutation(s) within the areas targeted by this assay, and inadequate number of viral copies(<138 copies/mL). A  negative result must be combined with clinical observations, patient history, and epidemiological information. The expected result is Negative.  Fact Sheet for Patients:  EntrepreneurPulse.com.au  Fact Sheet for Healthcare Providers:  IncredibleEmployment.be  This test is no t yet approved or cleared by the [place] FDA and  has been authorized for detection and/or diagnosis of SARS-CoV-2 by FDA under an Emergency Use Authorization (EUA). This EUA will remain  in effect (meaning this test can be used) for the duration of the COVID-19 declaration under Section 564(b)(1) of the Act, 21 U.S.C.section 360bbb-3(b)(1), unless the authorization  is terminated  or revoked sooner.       Influenza A by PCR NEGATIVE NEGATIVE Final   Influenza B by PCR NEGATIVE NEGATIVE Final    Comment: (NOTE) The Xpert Xpress SARS-CoV-2/FLU/RSV plus assay is intended as an aid in the diagnosis of influenza from Nasopharyngeal swab specimens and should not be used as a sole basis for treatment. Nasal washings and aspirates are unacceptable for Xpert Xpress SARS-CoV-2/FLU/RSV testing.  Fact Sheet for Patients: EntrepreneurPulse.com.au  Fact Sheet for Healthcare Providers: IncredibleEmployment.be  This test is not yet approved or cleared by the [place] FDA and has been authorized for detection and/or diagnosis of SARS-CoV-2 by FDA under an Emergency Use Authorization (EUA). This EUA will remain in effect (meaning this test can be used) for the duration of the COVID-19 declaration under Section 564(b)(1) of the Act, 21 U.S.C. section 360bbb-3(b)(1), unless the authorization is terminated or revoked.  Performed at [facility], [place] [address], [place], [place] [address]   Surgical PCR screen     Status: None   Collection Time: 09/18/20  9:02 AM   Specimen: Nasal Mucosa; Nasal Swab  Result Value Ref  Range Status   MRSA, PCR NEGATIVE NEGATIVE Final   Staphylococcus aureus NEGATIVE NEGATIVE Final    Comment: (NOTE) The Xpert SA Assay (FDA approved for NASAL specimens in patients 16 years of age and older), is one component of a comprehensive surveillance program. It is not intended to diagnose infection nor to guide or monitor treatment. Performed at [facility], [place] [address], [place], [place] [address]     Procedures and diagnostic studies:  No results found.  Medications:    [MAR Hold] feeding supplement  1 Container Oral TID BM   [MAR Hold] pantoprazole (PROTONIX) IV  40 mg Intravenous Q12H   Continuous Infusions:   LOS: 0 days   Geradine Girt  [facility]   How to contact the [facility] Attending or Consulting provider [place] or covering provider during after hours [place], for this patient?  Check the care team in [facility] and look for a) attending/consulting TRH provider listed and b) the [facility] team listed Log into [email or web address] and use [place]'s universal password to access. If you do not have the password, please contact the hospital operator. Locate the [facility] provider you are looking for under [facility] and page to a number that you can be directly reached. If you still have difficulty reaching the provider, please page the [facility] (Director on Call) for the Hospitalists listed on amion for assistance.  09/26/2020, 12:46 PM

## 2020-09-26 NOTE — Evaluation (Addendum)
Physical Therapy Evaluation Patient Details Name: Marcus [name] MRN: [id number] DOB: Jan 20, 1945 Today's Date: 09/26/2020  History of Present Illness  75 yo male admitted with GI bleed, anemia. Hx of prostate ca, R THA. S/P T5-T6 laminectomy with tumor resection 09/18/20  Clinical Impression  On eval, pt required Min guard-Min A for mobility. Total A to don TLSO sitting EOB (could possibly don himself however). Moderate pain with activity. Cues for adherence to back precautions. May need to consider ST SNF if pt is having trouble managing at home. At this time, recommendation is for HHPT. Will continue to follow and progress activity as tolerated.      Recommendations for follow up therapy are one component of a multi-disciplinary discharge planning process, led by the attending physician.  Recommendations may be updated based on patient status, additional functional criteria and insurance authorization.  Follow Up Recommendations Home Health PT    Equipment Recommendations  None recommended by PT    Recommendations for Other Services OT consult     Precautions / Restrictions Precautions Precautions: Fall;Back Required Braces or Orthoses: Spinal Brace Spinal Brace: Thoracolumbosacral orthotic;Applied in sitting position Restrictions Weight Bearing Restrictions: No      Mobility  Bed Mobility Overal bed mobility: Needs Assistance   Rolling: Supervision Sidelying to sit: Supervision   Sit to supine: Min assist   General bed mobility comments: able to get to roll and get to EOB, did need MinA for LE management returning to bed    Transfers Overall transfer level: Needs assistance Equipment used: Rolling walker (2 wheeled) Transfers: Sit to/from Stand Sit to Stand: Min guard         General transfer comment: Min guard for safety. Cues for safety, hand placement. Increased time.  Ambulation/Gait Ambulation/Gait assistance: Min guard Gait Distance (Feet): 40  Feet Assistive device: Rolling walker (2 wheeled) Gait Pattern/deviations: Step-through pattern;Decreased stride length     General Gait Details: Min guard for safety. Pt reported mild lightheadedness but not dizziness. Cues for safety,  Stairs            Wheelchair Mobility    Modified Rankin (Stroke Patients Only)       Balance Overall balance assessment: Needs assistance         Standing balance support: Bilateral upper extremity supported Standing balance-Leahy Scale: Fair                               Pertinent Vitals/Pain Pain Assessment: 0-10 Pain Score: 7  Pain Location: back Pain Descriptors / Indicators: Discomfort;Grimacing;Sore Pain Intervention(s): Limited activity within patient's tolerance;Monitored during session    [place] expects to be discharged to:: Private residence Living Arrangements: Non-relatives/Friends Available Help at Discharge: Friend(s);Available PRN/intermittently Type of Home: House Home Access: Stairs to enter Entrance Stairs-Rails: None Entrance Stairs-Number of Steps: 4 steps to front porch and threshold to enter. does not use back door Home Layout: Multi-level Home Equipment: Walker - 2 wheels      Prior Function Level of Independence: Independent with assistive device(s)         Comments: Performs ADLs. roommate does IADLs. Friends give him rides     Hand Dominance   Dominant Hand: Right    Extremity/Trunk Assessment   Upper Extremity Assessment Upper Extremity Assessment: Defer to OT evaluation    Lower Extremity Assessment Lower Extremity Assessment: Generalized weakness    Cervical / Trunk Assessment Cervical / Trunk Assessment: Normal  Communication   Communication: No difficulties  Cognition Arousal/Alertness: Awake/alert Behavior During Therapy: WFL for tasks assessed/performed Overall Cognitive Status: Within Functional Limits for tasks assessed                                         General Comments      Exercises     Assessment/Plan    PT Assessment Patient needs continued PT services  PT Problem List Decreased strength;Decreased knowledge of use of DME;Decreased activity tolerance;Decreased safety awareness;Decreased balance;Decreased knowledge of precautions;Decreased mobility;Pain       PT Treatment Interventions DME instruction;Gait training;Therapeutic exercise;Balance training;Functional mobility training;Patient/family education;Therapeutic activities    PT Goals (Current goals can be found in the Care Plan section)  Acute Rehab PT Goals Patient Stated Goal: to have less pain with movement PT Goal Formulation: With patient Time For Goal Achievement: 10/10/20 Potential to Achieve Goals: Good    Frequency Min 3X/week   Barriers to discharge        Co-evaluation               AM-PAC PT "6 Clicks" Mobility  Outcome Measure Help needed turning from your back to your side while in a flat bed without using bedrails?: A Little Help needed moving from lying on your back to sitting on the side of a flat bed without using bedrails?: A Little Help needed moving to and from a bed to a chair (including a wheelchair)?: A Little Help needed standing up from a chair using your arms (e.g., wheelchair or bedside chair)?: A Little Help needed to walk in hospital room?: A Little Help needed climbing 3-5 steps with a railing? : A Little 6 Click Score: 18    End of Session Equipment Utilized During Treatment: Back brace Activity Tolerance: Patient tolerated treatment well Patient left: in bed;with call bell/phone within reach;with bed alarm set   PT Visit Diagnosis: Unsteadiness on feet (R26.81);Muscle weakness (generalized) (M62.81);Difficulty in walking, not elsewhere classified (R26.2);Pain Pain - part of body:  (back)    Time: 1010-1022 PT Time Calculation (min) (ACUTE ONLY): 12 min   Charges:   PT  Evaluation $PT Eval Moderate Complexity: 1 Mod             Doreatha Massed, PT Acute Rehabilitation  Office: [phone] Pager: [phone]

## 2020-09-26 NOTE — Evaluation (Signed)
Occupational Therapy Evaluation Patient Details Name: DOHN WEIDERT MRN: [id number] DOB: 1945/08/19 Today's Date: 09/26/2020   History of Present Illness Patient is a 75 year old male who presented to the hosptial on 9/11 with dark stools. patient was found to have symptomatic anemia, GI bleed. of note patient recently had T5-T6 laminectomy and resection of thoracic spine tumor. PMH: prostatce cancer, R THA, acute renal failure   Clinical Impression   Patient is a 75 year old male who was admitted for above. Patient was living at home with intermittent supervision from friends. Currently, patient is min guard for functional mobility with consistent safety cues for RW use. Patient is mod A for LB Dressing tasks with increased dizziness with movements. Patient was noted to have decreased activity tolerance, increased pain wit movement, decreased safety awareness and carryover with back precautions impacting patients ability to engage in ADLs at PLOF. Patient would continue to benefit from skilled OT services at this time while admitted and after d/c to address noted deficits in order to improve overall safety and independence in ADLs.   Patients blood pressures: Supine: 119/69 mmhg HR 78 bpm Sitting: 103/61 mmhg  HR 94 bpm Standing: 104/49 mmhg  HR 98 bpm With patient symptomatic with transitions.        Recommendations for follow up therapy are one component of a multi-disciplinary discharge planning process, led by the attending physician.  Recommendations may be updated based on patient status, additional functional criteria and insurance authorization.   Follow Up Recommendations  Home health OT;Supervision/Assistance - 24 hour    Equipment Recommendations  Other (comment) (reacher, sock aid and long handled shoe horn)    Recommendations for Other Services       Precautions / Restrictions Precautions Precautions: Fall;Back Required Braces or Orthoses: Spinal Brace Spinal Brace:  Thoracolumbosacral orthotic;Applied in sitting position Restrictions Weight Bearing Restrictions: No      Mobility Bed Mobility Overal bed mobility: Needs Assistance Bed Mobility: Supine to Sit Rolling: Supervision (with cues to maintain back precautions)   Supine to sit: Min guard (with cues to maintain back precautions and use of bed rail)   Sit to sidelying: Min guard      Transfers Overall transfer level: Needs assistance Equipment used: Rolling walker (2 wheeled) Transfers: Sit to/from Stand Sit to Stand: Min guard         General transfer comment: min guard with RW with education for proper hand placement for transfers and safety cues.    Balance Overall balance assessment: Mild deficits observed, not formally tested                                         ADL either performed or assessed with clinical judgement   ADL Overall ADL's : Needs assistance/impaired Eating/Feeding: NPO   Grooming: Sitting;Wash/dry face;Min guard   Upper Body Bathing: Bed level;Set up   Lower Body Bathing: Minimal assistance;Sitting/lateral leans   Upper Body Dressing : Minimal assistance;Sitting Upper Body Dressing Details (indicate cue type and reason): patient requires assistance for donning/doffing brace Lower Body Dressing: Moderate assistance Lower Body Dressing Details (indicate cue type and reason): patient reported that he does not remember if he had AE at home or not. patient reported friends had been helping him with LB Dressing. Toilet Transfer: Min guard;Ambulation;RW Toilet Transfer Details (indicate cue type and reason): cues for safety with RW to keep on  ground and for proper hand placement for transitions. Toileting- Clothing Manipulation and Hygiene: Sit to/from stand;Min guard         General ADL Comments: patient reported feeling "swimming head" with transitions. patient was noted to have decreased activity tolerance with attempts to complete  functional mobility in room.     Vision Baseline Vision/History: 1 Wears glasses Ability to See in Adequate Light: 1 Impaired Patient Visual Report: No change from baseline       Perception     Praxis      Pertinent Vitals/Pain Pain Assessment: 0-10 Pain Score: 8  Pain Location: lying in bed after movment in upper back. Pain Descriptors / Indicators: Discomfort;Grimacing Pain Intervention(s): Limited activity within patient's tolerance;Monitored during session;Repositioned     Hand Dominance Right   Extremity/Trunk Assessment Upper Extremity Assessment Upper Extremity Assessment: Generalized weakness   Lower Extremity Assessment Lower Extremity Assessment: Defer to PT evaluation   Cervical / Trunk Assessment Cervical / Trunk Assessment: Kyphotic   Communication Communication Communication: No difficulties   Cognition Arousal/Alertness: Awake/alert Behavior During Therapy: WFL for tasks assessed/performed Overall Cognitive Status: No family/caregiver present to determine baseline cognitive functioning Area of Impairment: Memory;Safety/judgement;Problem solving                     Memory: Decreased short-term memory;Decreased recall of precautions   Safety/Judgement: Decreased awareness of safety;Decreased awareness of deficits   Problem Solving: Slow processing;Difficulty sequencing;Requires verbal cues General Comments: patient needed cues to apply back precautions to tasks. patient was able to recal 1/3 back precautions with increased education for bending and lifting. patient needed reminders for back precautions during ADL tasks.   General Comments       Exercises     Shoulder Instructions      Home Living Family/patient expects to be discharged to:: Private residence Living Arrangements: Non-relatives/Friends Available Help at Discharge: Friend(s);Available PRN/intermittently Type of Home: House Home Access: Stairs to enter [employer] of Steps: 4 steps to front porch and threshold to enter. does not use back door Entrance Stairs-Rails: None Home Layout: Multi-level     Bathroom Shower/Tub: Teacher, early years/pre: Standard     Home Equipment: Walker - 2 wheels   Additional Comments: Pt reports roomate can assist with care upon return home. patient reported being alone during the day.      Prior Functioning/Environment Level of Independence: Independent with assistive device(s)        Comments: Performs ADLs. roommate does IADLs. Friends give him rides        OT Problem List: Decreased strength;Decreased range of motion;Decreased activity tolerance;Impaired balance (sitting and/or standing);Decreased safety awareness;Decreased knowledge of precautions;Pain      OT Treatment/Interventions: Self-care/ADL training;Therapeutic exercise;DME and/or AE instruction;Therapeutic activities;Balance training;Patient/family education    OT Goals(Current goals can be found in the care plan section) Acute Rehab OT Goals Patient Stated Goal: to have less pain with movement OT Goal Formulation: With patient Time For Goal Achievement: 10/10/20 Potential to Achieve Goals: Good  OT Frequency: Min 2X/week   Barriers to D/C: Decreased caregiver support  patient reported being alone during the day while friends were at work       Co-evaluation              AM-PAC OT "6 Clicks" Daily Activity     Outcome Measure Help from another person eating meals?: Total (NPO at this time) Help from another person taking care of personal grooming?: A Little Help  from another person toileting, which includes using toliet, bedpan, or urinal?: A Little Help from another person bathing (including washing, rinsing, drying)?: A Little Help from another person to put on and taking off regular upper body clothing?: A Little Help from another person to put on and taking off regular lower body clothing?: A Lot 6  Click Score: 15   End of Session Equipment Utilized During Treatment: Back brace Nurse Communication: Mobility status  Activity Tolerance: Patient tolerated treatment well Patient left: in bed;with call bell/phone within reach  OT Visit Diagnosis: Unsteadiness on feet (R26.81);Other abnormalities of gait and mobility (R26.89);Muscle weakness (generalized) (M62.81);Pain Pain - Right/Left: Left Pain - part of body:  (mid back)                Time: [id number] OT Time Calculation (min): 36 min Charges:  OT General Charges $OT Visit: 1 Visit OT Evaluation $OT Eval Moderate Complexity: 1 Mod OT Treatments $Self Care/Home Management : 8-22 mins  Jackelyn Poling OTR/L, MS Acute Rehabilitation Department Office# [phone] Pager# [phone]   [place] 09/26/2020, 8:40 AM

## 2020-09-26 NOTE — Consult Note (Signed)
Referring Provider:  Dr. Wendee Beavers Primary Care Physician:  [name], MD Primary Gastroenterologist:  None  Reason for Consultation:  Melena and anemia  HPI: Marcus [name] is a 75 y.o. 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 Arnoldo Morale on 9/4 presents for anemia and melena.   Patient denies previous GI bleed in the past.   Patient has never had endoscopy or colonoscopy. Patient had hemoglobin of 11 on 09/01, with drop to 9.5.  No elevation of BUN or creatinine. Denies family history of colon cancer, states sister did have potential ileus or bowel issues with complications. Patient reports 1 to 2 days prior to back surgery noting black stool with small amounts of red blood. Has bowel movement once daily to every other day, denies changes in bowel movements recently.  Had BM this AM, small with small amount of black and a drop of red.  Patient has been on ibuprofen 600 mg twice daily since the surgery on 09/4.  Otherwise not on blood thinners.  Patient denies oral iron use or Pepto-Bismol. Patient does drink about 40 ounces of beer a day, no recreational drug use. He reports poor p.o. intake since he had a back surgery but denies nausea, vomiting, rectal pain, AB pain.  Surgery patient has been fatiguing quickly with some dizziness, denies chest pain or shortness of breath Denies fever, chills.   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    Prostate cancer ([place]) 09/04/13   Adenocarcinoma    Past Surgical History:  Procedure Laterality Date   LAMINECTOMY N/A 09/18/2020   Procedure: THORACIC LAMINECTOMY FOR TUMOR;  Surgeon:  Newman Pies, MD;  Location: [facility];  Service: Neurosurgery;  Laterality: N/A;   LYMPHADENECTOMY Bilateral 09/04/2013   Procedure: PELVIC LYMPH NODE DISSECTION;  Surgeon: Alexis Frock, MD;  Location: [facility];  Service: Urology;  Laterality: Bilateral;   ROBOT ASSISTED LAPAROSCOPIC RADICAL PROSTATECTOMY N/A 09/04/2013   Procedure: ROBOTIC ASSISTED LAPAROSCOPIC RADICAL PROSTATECTOMY, INDOCYANINE GREEN DYE;  Surgeon: Alexis Frock, MD;  Location: [facility];  Service: Urology;  Laterality: N/A;   TOTAL HIP ARTHROPLASTY Right 05/15/2016   Procedure: RIGHT TOTAL HIP ARTHROPLASTY ANTERIOR APPROACH;  Surgeon: Paralee Cancel, MD;  Location: [facility];  Service: Orthopedics;  Laterality: Right;   WRIST GANGLION EXCISION      Prior to Admission medications   Medication Sig Start Date End Date Taking? Authorizing Provider  acetaminophen (TYLENOL) 325 MG tablet Take 2 tablets (650 mg total) by mouth every 6 (six) hours as needed for up to 30 doses for mild pain or moderate pain. 08/31/20  Yes Trifan, Carola Rhine, MD  cyclobenzaprine (FLEXERIL) 10 MG tablet Take 1 tablet (10 mg total) by mouth 3 (three) times daily as needed for muscle spasms. 09/22/20  Yes Viona Gilmore D, NP  IBUPROFEN IB PO Take 1 tablet by mouth daily as needed (pain).   Yes [provider]  oxyCODONE 10 MG TABS Take 1 tablet (10 mg total) by mouth every 4 (four) hours as needed for severe pain ((score 7 to 10)). 09/22/20  Yes Viona Gilmore D,  NP  amLODipine (NORVASC) 10 MG tablet Take 1 tablet (10 mg total) by mouth daily. Patient not taking: No sig reported 09/17/18 11/16/18  Pokhrel, Corrie Mckusick, MD  docusate sodium (COLACE) 100 MG capsule Take 1 capsule (100 mg total) by mouth 2 (two) times daily. Patient not taking: No sig reported 09/22/20   Viona Gilmore D, NP  ferrous sulfate (FERROUSUL) 325 (65 FE) MG tablet Take 1 tablet (325 mg total) by mouth daily. Patient not taking: No sig reported 09/17/18   Pokhrel, Corrie Mckusick, MD    Scheduled  Meds:  feeding supplement  1 Container Oral TID BM   pantoprazole (PROTONIX) IV  40 mg Intravenous Q12H   Continuous Infusions: PRN Meds:.acetaminophen **OR** acetaminophen, HYDROmorphone (DILAUDID) injection, ondansetron **OR** ondansetron (ZOFRAN) IV, oxyCODONE  Allergies as of 09/25/2020   (No Known Allergies)    Family History  Problem Relation Age of Onset   Seizures Mother    Cancer Father        throat cancer    Social History   Socioeconomic History   Marital status: Legally Separated    Spouse name: Not on file   Number of children: Not on file   Years of education: Not on file   Highest education level: Not on file  Occupational History   Occupation: retired  Tobacco Use   Smoking status: Every Day    Packs/day: 0.25    Types: Cigarettes   Smokeless tobacco: Never  Vaping Use   Vaping Use: Never used  Substance and Sexual Activity   Alcohol use: Not Currently    Comment: 2- 40 ounce beers per week   Drug use: No   Sexual activity: Not Currently  Other Topics Concern   Not on file  Social History Narrative   Not on file   Social Determinants of Health   Financial Resource Strain: Not on file  Food Insecurity: Not on file  Transportation Needs: Not on file  Physical Activity: Not on file  Stress: Not on file  Social Connections: Not on file  Intimate Partner Violence: Not on file    Review of Systems:  Review of Systems  Constitutional:  Positive for malaise/fatigue. Negative for chills, diaphoresis, fever and weight loss.  HENT:  Negative for hearing loss.   Eyes:  Negative for redness.  Respiratory:  Negative for shortness of breath.   Cardiovascular:  Negative for chest pain and leg swelling.  Gastrointestinal:  Positive for blood in stool and melena. Negative for abdominal pain, constipation, diarrhea, heartburn, nausea and vomiting.  Genitourinary:  Negative for frequency.  Musculoskeletal:  Positive for back pain. Negative for falls.   Skin:  Negative for rash.  Neurological:  Positive for dizziness.  Psychiatric/Behavioral:  The patient is not nervous/anxious.     Physical Exam: Vital signs: Vitals:   09/25/20 2332 09/26/20 0315  BP: 119/64 122/65  Pulse: 84 86  Resp: 16 20  Temp: 99.7 F (37.6 C) 99.6 F (37.6 C)  SpO2: 98% 98%   Last BM Date: 09/24/20 Physical Exam Constitutional:      Appearance: Normal appearance.     Comments: Skinny appearing male, in no acute distress  Eyes:     General: No scleral icterus.    Conjunctiva/sclera: Conjunctivae normal.  Cardiovascular:     Rate and Rhythm: Normal rate and regular rhythm.     Heart sounds: No murmur heard. Pulmonary:     Effort: Pulmonary effort is normal.     Breath sounds: Normal breath sounds.  No wheezing.  Abdominal:     General: Bowel sounds are normal. There is no distension.     Palpations: Abdomen is soft. There is no mass.     Tenderness: There is abdominal tenderness (Patient has some diffuse tenderness, more so in RLQ). There is no guarding or rebound.  Musculoskeletal:        General: No swelling.     Comments: Slow transition from sitting to lying due to pain  Skin:    Coloration: Skin is not jaundiced.     Comments: Thoracic back surgical site  Neurological:     General: No focal deficit present.     Comments: Walks with walker  Psychiatric:        Mood and Affect: Mood normal.        Behavior: Behavior normal.     GI:  Lab Results: Recent Labs    09/25/20 1304 09/25/20 1554 09/25/20 2253 09/26/20 0641  WBC 8.2  --   --  7.2  HGB 9.9* 9.5* 8.5* 8.2*  HCT 31.4* 29.6* 25.7* 25.1*  PLT 187  --   --  199   BMET Recent Labs    09/25/20 1304 09/26/20 0641  NA 138 131*  K 3.6 3.2*  CL 107 101  CO2 21* 22  GLUCOSE 91 129*  BUN 16 9  CREATININE 0.96 0.90  CALCIUM 8.5* 7.8*   LFT Recent Labs    09/25/20 1304  PROT 6.3*  ALBUMIN 3.1*  AST 20  ALT 26  ALKPHOS 67  BILITOT 0.9   PT/INR Recent Labs     09/25/20 1554  LABPROT 14.2  INR 1.1    Studies/Results: No results found.  Impression: Anemia with melena in setting on NSAID s/p surgery HGB 9.9-->8.2 likely dropped in the hospital due to dilution. 09/15/2020 HGB 11.0 BUN 16-->9, Cr 0.96 appears to be at baseline- not an acute bleed  Prostate cancer with mets Normal platelets and WBC  Plan Plan for EGD today. I thoroughly discussed the procedure to include nature, alternatives, benefits, and risks including but not limited to bleeding, perforation, infection, anesthesia/cardiac and pulmonary complications. Patient provides understanding and gave verbal consent to proceed.   Continue Protonix 40 mg IV BID.   NPO at this time   Continue daily CBC with transfusion as needed to maintain Hgb >7.   Patient has never had colonoscopy, if EGD is negative can consider colonoscopy  [facility] will follow.    LOS: 0 days   [name]  PA-C 09/26/2020, 8:14 AM  Contact #  [phone]

## 2020-09-26 NOTE — Anesthesia Postprocedure Evaluation (Signed)
Anesthesia Post Note  Patient: Marcus [name]  Procedure(s) Performed: ESOPHAGOGASTRODUODENOSCOPY (EGD) WITH PROPOFOL BIOPSY     Patient location during evaluation: PACU Anesthesia Type: MAC Level of consciousness: awake and alert Pain management: pain level controlled Vital Signs Assessment: post-procedure vital signs reviewed and stable Respiratory status: spontaneous breathing, nonlabored ventilation, respiratory function stable and patient connected to nasal cannula oxygen Cardiovascular status: stable and blood pressure returned to baseline Postop Assessment: no apparent nausea or vomiting Anesthetic complications: no   No notable events documented.  Last Vitals:  Vitals:   09/26/20 1348 09/26/20 1358  BP: (!) 109/41 (!) 125/48  Pulse: 82 78  Resp: (!) 21 15  Temp:    SpO2: 93% 94%    Last Pain:  Vitals:   09/26/20 1358  TempSrc:   PainSc: 0-No pain                 Anecia Nusbaum S

## 2020-09-26 NOTE — Progress Notes (Signed)
Initial Nutrition Assessment  INTERVENTION:   -Resume Boost Breeze po TID, each supplement provides 250 kcal and 9 grams of protein once diet advanced  -Multivitamin with minerals daily  NUTRITION DIAGNOSIS:   Increased nutrient needs related to acute illness as evidenced by estimated needs.  GOAL:   Patient will meet greater than or equal to 90% of their needs  MONITOR:   Diet advancement, Labs, Weight trends, I & O's  REASON FOR ASSESSMENT:   Consult Poor PO  ASSESSMENT:   75 y.o. 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 Arnoldo Morale on 9/4 and anemia returning with upper back pain, decreased appetite and black stool.  Patient currently NPO for EGD today. Pt is s/p laminectomy for thoracic tumor 9/4. Pt reporting poor appetite and PO since that time. Pt has also continued to drink 40 oz of beer daily. Boost Gwyneth Revels has already been ordered, will continue following procedure.  Per weight records, no significant weight changes noted.  Medications reviewed.  Labs reviewed:  Low Na, K  NUTRITION - FOCUSED PHYSICAL EXAM:  Unable to complete  Diet Order:   Diet Order             Diet NPO time specified  Diet effective midnight                   EDUCATION NEEDS:   No education needs have been identified at this time  Skin:  Skin Assessment: Skin Integrity Issues: Skin Integrity Issues:: Incisions Incisions: vertebral column  Last BM:  9/12  Height:   Ht Readings from Last 1 Encounters:  09/25/20 '5\' 10"'$  (1.778 m)    Weight:   Wt Readings from Last 1 Encounters:  09/25/20 70.7 kg    BMI:  Body mass index is 22.35 kg/m.  Estimated Nutritional Needs:   Kcal:  1800-2000  Protein:  85-100g  Fluid:  2L/day  Clayton Bibles, MS, RD, LDN Inpatient Clinical  Dietitian Contact information available via Amion

## 2020-09-26 NOTE — Transfer of Care (Signed)
Immediate Anesthesia Transfer of Care Note  Patient: Marcus [name]  Procedure(s) Performed: ESOPHAGOGASTRODUODENOSCOPY (EGD) WITH PROPOFOL BIOPSY  Patient Location: PACU and Endoscopy Unit  Anesthesia Type:MAC  Level of Consciousness: awake, alert , oriented and patient cooperative  Airway & Oxygen Therapy: Patient Spontanous Breathing and Patient connected to face mask oxygen  Post-op Assessment: Report given to RN, Post -op Vital signs reviewed and stable and Patient moving all extremities  Post vital signs: Reviewed and stable  Last Vitals:  Vitals Value Taken Time  BP 92/40 09/26/20 1338  Temp    Pulse 76 09/26/20 1340  Resp 19 09/26/20 1340  SpO2 100 % 09/26/20 1340  Vitals shown include unvalidated device data.  Last Pain:  Vitals:   09/26/20 1338  TempSrc:   PainSc: 0-No pain         Complications: No notable events documented.

## 2020-09-26 NOTE — Progress Notes (Signed)
D/t dizziness with ambulation , bed alarm placed on for safety. Pt agreeable to safety plan. Bed alarm on , non skid socks intact. Pt states he will call for help with assistance.

## 2020-09-26 NOTE — Addendum Note (Signed)
Addended by: Tyler Pita on: 09/26/2020 11:53 AM   Modules accepted: Orders

## 2020-09-26 NOTE — Anesthesia Preprocedure Evaluation (Signed)
Anesthesia Evaluation  Patient identified by MRN, date of birth, ID band Patient awake    Reviewed: Allergy & Precautions, NPO status , Patient's Chart, lab work & pertinent test results  Airway Mallampati: II  TM Distance: >3 FB Neck ROM: Full    Dental no notable dental hx.    Pulmonary neg pulmonary ROS, Current Smoker,    Pulmonary exam normal breath sounds clear to auscultation       Cardiovascular hypertension, Pt. on medications Normal cardiovascular exam Rhythm:Regular Rate:Normal     Neuro/Psych negative neurological ROS  negative psych ROS   GI/Hepatic negative GI ROS, Neg liver ROS,   Endo/Other  negative endocrine ROS  Renal/GU prostate cancer with bone mets   negative genitourinary   Musculoskeletal negative musculoskeletal ROS (+)   Abdominal   Peds negative pediatric ROS (+)  Hematology  (+) anemia ,   Anesthesia Other Findings   Reproductive/Obstetrics negative OB ROS                             Anesthesia Physical Anesthesia Plan  ASA: 3  Anesthesia Plan: MAC   Post-op Pain Management:    Induction: Intravenous  PONV Risk Score and Plan: 1 and Propofol infusion  Airway Management Planned: Simple Face Mask  Additional Equipment:   Intra-op Plan:   Post-operative Plan:   Informed Consent: I have reviewed the patients History and Physical, chart, labs and discussed the procedure including the risks, benefits and alternatives for the proposed anesthesia with the patient or authorized representative who has indicated his/her understanding and acceptance.     Dental advisory given  Plan Discussed with: CRNA and Surgeon  Anesthesia Plan Comments:         Anesthesia Quick Evaluation

## 2020-09-26 NOTE — Op Note (Signed)
[facility] Patient Name: Marcus [name] Procedure Date: 09/26/2020 MRN: [id number] Attending MD: Ronnette Juniper , MD Date of Birth: 11-Jul-1945 CSN: [id number] Age: 75 Admit Type: Inpatient Procedure:                Upper GI endoscopy Indications:              Unexplained iron deficiency anemia, Heme positive                            stool Providers:                Ronnette Juniper, MD, Elmer Ramp. Tilden Dome, RN, Laverda Sorenson,                            Technician, Courtney Heys. [place], CRNA Referring MD:             [facility] Medicines:                Monitored Anesthesia Care Complications:            No immediate complications. Estimated blood loss:                            Minimal. Estimated Blood Loss:     Estimated blood loss was minimal. Procedure:                Pre-Anesthesia Assessment:                           - Prior to the procedure, a History and Physical                            was performed, and patient medications and                            allergies were reviewed. The patient's tolerance of                            previous anesthesia was also reviewed. The risks                            and benefits of the procedure and the sedation                            options and risks were discussed with the patient.                            All questions were answered, and informed consent                            was obtained. Prior Anticoagulants: The patient has                            taken no previous anticoagulant or antiplatelet                            agents.  ASA Grade Assessment: II - A patient with                            mild systemic disease. After reviewing the risks                            and benefits, the patient was deemed in                            satisfactory condition to undergo the procedure.                           After obtaining informed consent, the endoscope was                            passed under  direct vision. Throughout the                            procedure, the patient's blood pressure, pulse, and                            oxygen saturations were monitored continuously. The                            [id number] Olympus endoscope was introduced                            through the mouth, and advanced to the second part                            of duodenum. The upper GI endoscopy was                            accomplished without difficulty. The patient                            tolerated the procedure well. Scope In: Scope Out: Findings:      The upper third of the esophagus was normal.      A large, fungating and ulcerating mass with no bleeding and no stigmata       of recent bleeding was found in the middle third of the esophagus, 25 to       30 cm from the incisors. The mass was partially obstructing and       partially circumferential (involving one-half of the lumen       circumference). Biopsies were taken with a cold forceps for histology.      There were esophageal mucosal changes consistent with long-segment       Barrett's esophagus present in the lower third of the esophagus. The       maximum longitudinal extent of these mucosal changes was 5 cm in length.       Mucosa was biopsied with a cold forceps for histology in a targeted       manner from 35 to 40 cm from the incisors. One specimen bottle was sent  to pathology.      The entire examined stomach was normal.      The cardia and gastric fundus were normal on retroflexion.      Patchy moderately erythematous mucosa without active bleeding and with       no stigmata of bleeding was found in the duodenal bulb and in the first       portion of the duodenum. Impression:               - Normal upper third of esophagus.                           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                             Biopsied.                           - Esophageal mucosal changes consistent with                            long-segment Barrett's esophagus. Biopsied.                           - Normal stomach.                           - Erythematous duodenopathy. Moderate Sedation:      Patient did not receive moderate sedation for this procedure, but       instead received monitored anesthesia care. Recommendation:           - Clear liquid diet.                           - Continue present medications.                           - Await pathology results.                           - Perform a CT scan (computed tomography) of chest                            with contrast, abdomen with contrast and pelvis                            with contrast today.                           - Refer to an oncologist. Procedure Code(s):        --- Professional ---                           [phone], Esophagogastroduodenoscopy, flexible,  transoral; with biopsy, single or multiple Diagnosis Code(s):        --- Professional ---                           K22.8, Other specified diseases of esophagus                           C15.4, Malignant neoplasm of middle third of                            esophagus                           K31.89, Other diseases of stomach and duodenum                           D50.9, Iron deficiency anemia, unspecified                           R19.5, Other fecal abnormalities CPT copyright 2019 [employer]. All rights reserved. The codes documented in this report are preliminary and upon coder review may  be revised to meet current compliance requirements. Ronnette Juniper, MD 09/26/2020 1:47:17 PM This report has been signed electronically. Number of Addenda: 0

## 2020-09-26 NOTE — Addendum Note (Signed)
Addended by: Pincus Large on: 09/26/2020 12:18 PM   Modules accepted: Orders

## 2020-09-27 ENCOUNTER — Other Ambulatory Visit: Payer: Self-pay | Admitting: Radiation Therapy

## 2020-09-27 ENCOUNTER — Encounter (HOSPITAL_COMMUNITY): Payer: Self-pay | Admitting: Gastroenterology

## 2020-09-27 DIAGNOSIS — Z515 Encounter for palliative care: Secondary | ICD-10-CM | POA: Diagnosis not present

## 2020-09-27 DIAGNOSIS — K2289 Other specified disease of esophagus: Secondary | ICD-10-CM

## 2020-09-27 DIAGNOSIS — C159 Malignant neoplasm of esophagus, unspecified: Secondary | ICD-10-CM | POA: Diagnosis present

## 2020-09-27 DIAGNOSIS — K6389 Other specified diseases of intestine: Secondary | ICD-10-CM | POA: Diagnosis not present

## 2020-09-27 DIAGNOSIS — D72829 Elevated white blood cell count, unspecified: Secondary | ICD-10-CM | POA: Diagnosis present

## 2020-09-27 DIAGNOSIS — F17210 Nicotine dependence, cigarettes, uncomplicated: Secondary | ICD-10-CM | POA: Diagnosis present

## 2020-09-27 DIAGNOSIS — C187 Malignant neoplasm of sigmoid colon: Secondary | ICD-10-CM | POA: Diagnosis present

## 2020-09-27 DIAGNOSIS — D500 Iron deficiency anemia secondary to blood loss (chronic): Secondary | ICD-10-CM | POA: Diagnosis present

## 2020-09-27 DIAGNOSIS — E43 Unspecified severe protein-calorie malnutrition: Secondary | ICD-10-CM | POA: Diagnosis not present

## 2020-09-27 DIAGNOSIS — Z23 Encounter for immunization: Secondary | ICD-10-CM | POA: Diagnosis not present

## 2020-09-27 DIAGNOSIS — Z20822 Contact with and (suspected) exposure to covid-19: Secondary | ICD-10-CM | POA: Diagnosis present

## 2020-09-27 DIAGNOSIS — K922 Gastrointestinal hemorrhage, unspecified: Secondary | ICD-10-CM | POA: Diagnosis present

## 2020-09-27 DIAGNOSIS — C154 Malignant neoplasm of middle third of esophagus: Secondary | ICD-10-CM | POA: Diagnosis present

## 2020-09-27 DIAGNOSIS — C787 Secondary malignant neoplasm of liver and intrahepatic bile duct: Secondary | ICD-10-CM | POA: Diagnosis present

## 2020-09-27 DIAGNOSIS — C189 Malignant neoplasm of colon, unspecified: Secondary | ICD-10-CM | POA: Diagnosis not present

## 2020-09-27 DIAGNOSIS — Z7189 Other specified counseling: Secondary | ICD-10-CM

## 2020-09-27 DIAGNOSIS — Z95828 Presence of other vascular implants and grafts: Secondary | ICD-10-CM | POA: Diagnosis not present

## 2020-09-27 DIAGNOSIS — E871 Hypo-osmolality and hyponatremia: Secondary | ICD-10-CM

## 2020-09-27 DIAGNOSIS — R531 Weakness: Secondary | ICD-10-CM | POA: Diagnosis not present

## 2020-09-27 DIAGNOSIS — C7989 Secondary malignant neoplasm of other specified sites: Secondary | ICD-10-CM | POA: Diagnosis present

## 2020-09-27 DIAGNOSIS — K921 Melena: Secondary | ICD-10-CM | POA: Diagnosis not present

## 2020-09-27 DIAGNOSIS — C61 Malignant neoplasm of prostate: Secondary | ICD-10-CM | POA: Diagnosis not present

## 2020-09-27 DIAGNOSIS — E875 Hyperkalemia: Secondary | ICD-10-CM | POA: Diagnosis not present

## 2020-09-27 DIAGNOSIS — K3189 Other diseases of stomach and duodenum: Secondary | ICD-10-CM | POA: Diagnosis present

## 2020-09-27 DIAGNOSIS — I951 Orthostatic hypotension: Secondary | ICD-10-CM | POA: Diagnosis not present

## 2020-09-27 DIAGNOSIS — Z751 Person awaiting admission to adequate facility elsewhere: Secondary | ICD-10-CM | POA: Diagnosis not present

## 2020-09-27 DIAGNOSIS — I10 Essential (primary) hypertension: Secondary | ICD-10-CM | POA: Diagnosis present

## 2020-09-27 DIAGNOSIS — C7951 Secondary malignant neoplasm of bone: Secondary | ICD-10-CM | POA: Diagnosis present

## 2020-09-27 DIAGNOSIS — K567 Ileus, unspecified: Secondary | ICD-10-CM | POA: Diagnosis not present

## 2020-09-27 DIAGNOSIS — E538 Deficiency of other specified B group vitamins: Secondary | ICD-10-CM | POA: Diagnosis present

## 2020-09-27 DIAGNOSIS — D630 Anemia in neoplastic disease: Secondary | ICD-10-CM | POA: Diagnosis present

## 2020-09-27 DIAGNOSIS — E559 Vitamin D deficiency, unspecified: Secondary | ICD-10-CM | POA: Diagnosis present

## 2020-09-27 DIAGNOSIS — E876 Hypokalemia: Secondary | ICD-10-CM | POA: Diagnosis present

## 2020-09-27 DIAGNOSIS — E860 Dehydration: Secondary | ICD-10-CM | POA: Diagnosis not present

## 2020-09-27 DIAGNOSIS — Q438 Other specified congenital malformations of intestine: Secondary | ICD-10-CM | POA: Diagnosis not present

## 2020-09-27 LAB — CBC
HCT: 25.5 % — ABNORMAL LOW (ref 39.0–52.0)
Hemoglobin: 8.4 g/dL — ABNORMAL LOW (ref 13.0–17.0)
MCH: 25.2 pg — ABNORMAL LOW (ref 26.0–34.0)
MCHC: 32.9 g/dL (ref 30.0–36.0)
MCV: 76.6 fL — ABNORMAL LOW (ref 80.0–100.0)
Platelets: 221 10*3/uL (ref 150–400)
RBC: 3.33 MIL/uL — ABNORMAL LOW (ref 4.22–5.81)
RDW: 17.6 % — ABNORMAL HIGH (ref 11.5–15.5)
WBC: 6.7 10*3/uL (ref 4.0–10.5)
nRBC: 0 % (ref 0.0–0.2)

## 2020-09-27 LAB — BASIC METABOLIC PANEL
Anion gap: 9 (ref 5–15)
BUN: 7 mg/dL — ABNORMAL LOW (ref 8–23)
CO2: 21 mmol/L — ABNORMAL LOW (ref 22–32)
Calcium: 8 mg/dL — ABNORMAL LOW (ref 8.9–10.3)
Chloride: 98 mmol/L (ref 98–111)
Creatinine, Ser: 0.78 mg/dL (ref 0.61–1.24)
GFR, Estimated: >60 mL/min (ref >60)
Glucose, Bld: 107 mg/dL — ABNORMAL HIGH (ref 70–99)
Potassium: 3.5 mmol/L (ref 3.5–5.1)
Sodium: 128 mmol/L — ABNORMAL LOW (ref 135–145)

## 2020-09-27 MED ORDER — SODIUM CHLORIDE 0.9 % IV SOLN: INTRAVENOUS | Status: DC

## 2020-09-27 MED ORDER — PEG 3350-KCL-NA BICARB-NACL 420 G PO SOLR
4000.0000 mL | Freq: Once | ORAL | Status: AC
  Administered 2020-09-27: 4000 mL via ORAL

## 2020-09-27 MED ORDER — PEG 3350-KCL-NA BICARB-NACL 420 G PO SOLR: 4000.0000 mL | Freq: Once | ORAL | Status: DC

## 2020-09-27 NOTE — Progress Notes (Addendum)
Bowel prepped started. BSC at bedside. Pt states he will call for assistance.

## 2020-09-27 NOTE — Progress Notes (Signed)
Physical Therapy Treatment Patient Details Name: Marcus [name] MRN: [id number] DOB: Sep 22, 1945 Today's Date: 09/27/2020   History of Present Illness 75 yo male admitted with GI bleed, anemia. Hx of prostate ca, R THA    PT Comments    Pt very cooperative and requiring min physical assist for mobility except for donning of back brace in sitting.  Pt continues to need cueing for back precautions and offers minimal insight into donning and doffing back brace - states "I have a roommate to help me but he doesn't know anything about this either".  Pt up to ambulate increased distance in hall but limited by fatigue.    Recommendations for follow up therapy are one component of a multi-disciplinary discharge planning process, led by the attending physician.  Recommendations may be updated based on patient status, additional functional criteria and insurance authorization.  Follow Up Recommendations  Home health PT     Equipment Recommendations  None recommended by PT    Recommendations for Other Services       Precautions / Restrictions Precautions Precautions: Fall;Back Required Braces or Orthoses: Spinal Brace Spinal Brace: Thoracolumbosacral orthotic;Applied in sitting position Restrictions Weight Bearing Restrictions: No     Mobility  Bed Mobility Overal bed mobility: Needs Assistance Bed Mobility: Rolling;Sidelying to Sit Rolling: Supervision Sidelying to sit: Supervision       General bed mobility comments: cues for technique but no physical assist    Transfers Overall transfer level: Needs assistance Equipment used: Rolling walker (2 wheeled) Transfers: Sit to/from Stand Sit to Stand: Min guard         General transfer comment: Min guard for safety. Cues for safety, hand placement. Increased time.  Ambulation/Gait Ambulation/Gait assistance: Min guard Gait Distance (Feet): 124 Feet Assistive device: Rolling walker (2 wheeled) Gait Pattern/deviations:  Step-through pattern;Decreased stride length Gait velocity: Decreased   General Gait Details: min guard for safety; cues for posture and position from [employer]             Wheelchair Mobility    Modified Rankin (Stroke Patients Only)       Balance Overall balance assessment: Needs assistance Sitting-balance support: Feet supported;No upper extremity supported Sitting balance-Leahy Scale: Good     Standing balance support: Bilateral upper extremity supported Standing balance-Leahy Scale: Fair                              Cognition Arousal/Alertness: Awake/alert Behavior During Therapy: WFL for tasks assessed/performed Overall Cognitive Status: Within Functional Limits for tasks assessed Area of Impairment: Memory;Safety/judgement;Problem solving                     Memory: Decreased short-term memory;Decreased recall of precautions   Safety/Judgement: Decreased awareness of safety;Decreased awareness of deficits   Problem Solving: Slow processing;Difficulty sequencing;Requires verbal cues General Comments: patient needed cues to apply back precautions to tasks. patient was able to recal 1/3 back precautions with increased education for bending and lifting. patient needed reminders for back precautions during ADL tasks.      Exercises      General Comments        Pertinent Vitals/Pain Pain Assessment: 0-10 Pain Score: 6  Pain Location: back Pain Descriptors / Indicators: Discomfort;Grimacing;Sore Pain Intervention(s): Limited activity within patient's tolerance;Monitored during session;Repositioned    Home Living  Prior Function            PT Goals (current goals can now be found in the care plan section) Acute Rehab PT Goals Patient Stated Goal: less pain, regain IND PT Goal Formulation: With patient Time For Goal Achievement: 10/10/20 Potential to Achieve Goals: Good Progress towards PT goals:  Progressing toward goals    Frequency    Min 3X/week      PT Plan Current plan remains appropriate    Co-evaluation              AM-PAC PT "6 Clicks" Mobility   Outcome Measure  Help needed turning from your back to your side while in a flat bed without using bedrails?: A Little Help needed moving from lying on your back to sitting on the side of a flat bed without using bedrails?: A Little Help needed moving to and from a bed to a chair (including a wheelchair)?: A Little Help needed standing up from a chair using your arms (e.g., wheelchair or bedside chair)?: A Little Help needed to walk in hospital room?: A Little Help needed climbing 3-5 steps with a railing? : A Little 6 Click Score: 18    End of Session Equipment Utilized During Treatment: Back brace Activity Tolerance: Patient tolerated treatment well Patient left: in chair;with call bell/phone within reach;with chair alarm set Nurse Communication: Mobility status PT Visit Diagnosis: Unsteadiness on feet (R26.81);Muscle weakness (generalized) (M62.81);Difficulty in walking, not elsewhere classified (R26.2);Pain Pain - part of body:  (back)     Time: [id number] PT Time Calculation (min) (ACUTE ONLY): 17 min  Charges:  $Gait Training: 8-22 mins                     [employer] Pager [phone] Office [phone]    Whittney Steenson 09/27/2020, 10:32 AM

## 2020-09-27 NOTE — Progress Notes (Signed)
[facility] Gastroenterology Progress Note  Marcus [name] 75 y.o. [birth year]  CC:  Melena and Anemia   Subjective: Patient denies dysphagia, globulus sensation, odynophagia.  Has had frequent coughing and throat clearing.  Patient's last BM was yesterday morning, small volume, melena with hematochezia. Has been having small volume stools for 1-2 weeks.  Denies nausea, vomiting, AB pain.  Patient denies fever, chills.    Review of Systems  Constitutional:  Positive for malaise/fatigue and weight loss. Negative for chills and fever.  HENT:  Negative for sore throat.   Respiratory:  Negative for shortness of breath.   Cardiovascular:  Negative for chest pain and leg swelling.  Gastrointestinal:  Positive for blood in stool, constipation and melena. Negative for abdominal pain, diarrhea, heartburn, nausea and vomiting.  Musculoskeletal:  Negative for falls.  Skin:  Negative for itching.  Neurological:  Positive for dizziness. Negative for focal weakness.     Objective: Vital signs in last 24 hours: Vitals:   09/26/20 2106 09/27/20 0516  BP: (!) 159/66 134/61  Pulse: 75 82  Resp: 20 20  Temp: 98.7 F (37.1 C) 99.2 F (37.3 C)  SpO2: 100% 100%    Physical Exam Constitutional:      General: He is awake. He is not in acute distress.    Appearance: He is underweight.  HENT:     Mouth/Throat:     Comments: Poor dentition Eyes:     General: No scleral icterus. Abdominal:     General: Abdomen is flat. Bowel sounds are normal. There is no distension.     Palpations: Abdomen is soft.     Tenderness: There is no abdominal tenderness. There is no guarding or rebound.  Musculoskeletal:     Comments: Walks with walker, slow to transition in bed  Psychiatric:        Behavior: Behavior is cooperative.     Lab Results: Recent Labs    09/26/20 0641 09/27/20 0414  NA 131* 128*  K 3.2* 3.5  CL 101 98  CO2 22 21*  GLUCOSE 129* 107*  BUN 9 7*  CREATININE 0.90 0.78  CALCIUM  7.8* 8.0*   Recent Labs    09/25/20 1304  AST 20  ALT 26  ALKPHOS 67  BILITOT 0.9  PROT 6.3*  ALBUMIN 3.1*   Recent Labs    09/25/20 1304 09/25/20 1554 09/26/20 0641 09/27/20 0414  WBC 8.2  --  7.2 6.7  NEUTROABS 6.4  --   --   --   HGB 9.9*   < > 8.2* 8.4*  HCT 31.4*   < > 25.1* 25.5*  MCV 80.5  --  76.5* 76.6*  PLT 187  --  199 221   < > = values in this interval not displayed.   Recent Labs    09/25/20 1554  LABPROT 14.2  INR 1.1   CT chest/AB/Pelvis 09/26/2020 IMPRESSION: 1. Irregular circumferential wall thickening within the midthoracic esophagus consistent with the esophageal mass described in history. Findings are highly suspicious for neoplasm. 2. Circumferential apple-core type mass involving the distal sigmoid colon compatible with colonic malignancy. Colonoscopy recommended if not previously performed. There is an element of partial colonic obstruction with significant proximal colonic dilation and fecal retention. 3. Extensive lymphadenopathy within the abdomen and pelvis as above, largest lymph node mass in the upper phrenic region, consistent with nodal metastases. 4. Hypodensities within the liver, largest in the right lobe suspicious for metastatic disease. 5. Trace bilateral pleural effusions. 6. Postsurgical changes  from recent thoracic fusion and laminectomy. 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 Please refer to previous MRI discussion. 7. 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 8. 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  Continue Protonix 40 mg IV BID. Oncology consults May want to consider genetic testing  Sigmoid colonic mass seen on CT Patient clear liquids NPO at midnight Nulytely Prep   I thoroughly discussed the procedure with the patient (at bedside) to include nature of the procedure, alternatives, benefits, and risks (including but not limited to bleeding, infection, perforation, anesthesia/cardiac pulmonary complications).  Patient verbalized understanding and gave verbal consent to proceed with Colonoscopy.  Anemia  -HGB 8.4, Iron is low but Ferritin and TIBC are elevated, likely anemia of chronic disease B12 low on B12 replacement Continue to monitor H&H with transfusion as needed to maintain hemoglobin greater than 7.   Prostate cancer with mets    [facility] will follow.     Bayley Radford Pax PA-C 09/27/2020, 7:56 AM  Contact #  [phone]

## 2020-09-27 NOTE — Progress Notes (Signed)
Progress Note    Marcus [name]  [id number] DOB: 1945/03/02  DOA: 09/25/2020 PCP: [name], MD    Brief Narrative:    Medical records reviewed and are as summarized below:  Marcus [name] is an 75 y.o. 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 Arnoldo Morale on 9/4 and anemia returning with upper back pain, decreased appetite and black stool.  EGD 9/12- mass biopsied.  CT scan of abdomen also shows possible mass.  Oncology consult.    Assessment/Plan:   Active Problems:   GI bleed   Esophageal cancer (HCC)   Symptomatic anemia in the setting of possible upper GI bleed-seems to be a slow bleed in the setting of NSAID use after recent surgery.  Hemodynamically stable. -IV Protonix -EGD 9/12- mass biopsied: Poorly differentiated carcinoma -CT scan of abdomen also shows mass- colonoscopy scheduled for 9/14 -oncology consulted   Postsurgical back pain-from recent thoracic spine surgery.  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 Arnoldo Morale on 9/4.  Surgical wound appears clean.  No focal neuro symptoms or bowel or bladder habit change. -Pain control with as needed Tylenol, oxycodone and IV Dilaudid. -Outpatient follow-up with neurosurgery -PT/OT eval- home health   History of prostate cancer with osseous mets s/p radical prostatectomy and radiation in 2015.   -Outpatient follow-up   Pyuria/bacteriuria-patient denies UTI symptoms, fever, chills, suprapubic pain or lower back pain. -No indication for treatment.  Low B12 -replace IM x 7 days  Hypokalemia -replete PO  Hyponatremia -recheck in AM  Vitamin D def -will need replacement after procedures  Family Communication/Anticipated D/C date and plan/Code Status    DVT prophylaxis: scd Code Status: Full Code.  Disposition Plan: Status is: inpt  The patient will require care spanning > 2 midnights and should be moved to inpatient because: Inpatient level of care appropriate due to severity of illness  Dispo: The patient is from: Home              Anticipated d/c is to: Home              Patient currently is not medically stable to d/c.   Difficult to place patient No         Medical Consultants:   GI oncology  Subjective:   No SOB, no CP Understandably shocked by all his results  Objective:    Vitals:   09/26/20 1428 09/26/20 2106 09/27/20 0516 09/27/20 1200  BP:  (!) 159/66 134/61 118/61  Pulse:  75 82 80  Resp:  20 20   Temp: 98.9 F (37.2 C) 98.7 F (37.1 C) 99.2 F (37.3 C) 98.7 F (37.1 C)  TempSrc: Oral Oral Oral Oral  SpO2:  100% 100% 100%  Weight:      Height:        Intake/Output Summary (Last 24 hours) at 09/27/2020 1220 Last data filed at 09/26/2020 1340 Gross per 24 hour  Intake 318.61 ml  Output --  Net 318.61 ml   Filed Weights   09/25/20 0617 09/26/20 1245  Weight: 70.7 kg 74.8 kg    Exam:   General: Appearance:    Well developed, well nourished male in no acute distress     Lungs:      respirations unlabored  Heart:  Normal heart rate. Normal rhythm. No murmurs, rubs, or gallops.    MS:   All extremities are intact.    Neurologic:   Awake, alert, oriented x 3. No apparent focal neurological           defect.        Data Reviewed:   I have personally reviewed following labs and imaging studies:  Labs: Labs show the following:   Basic Metabolic Panel: Recent Labs  Lab 09/25/20 1304 09/26/20 0641 09/27/20 0414  NA 138 131* 128*  K 3.6 3.2* 3.5  CL 107 101 98  CO2 21* 22 21*  GLUCOSE 91 129* 107*  BUN 16 9 7*  CREATININE 0.96 0.90 0.78  CALCIUM 8.5* 7.8* 8.0*   GFR Estimated Creatinine Clearance: 86.2 mL/min (by C-G formula based on SCr of 0.78 mg/dL). Liver Function  Tests: Recent Labs  Lab 09/25/20 1304  AST 20  ALT 26  ALKPHOS 67  BILITOT 0.9  PROT 6.3*  ALBUMIN 3.1*   No results for input(s): LIPASE, AMYLASE in the last 168 hours. No results for input(s): AMMONIA in the last 168 hours. Coagulation profile Recent Labs  Lab 09/25/20 1554  INR 1.1    CBC: Recent Labs  Lab 09/25/20 1304 09/25/20 1554 09/25/20 2253 09/26/20 0641 09/27/20 0414  WBC 8.2  --   --  7.2 6.7  NEUTROABS 6.4  --   --   --   --   HGB 9.9* 9.5* 8.5* 8.2* 8.4*  HCT 31.4* 29.6* 25.7* 25.1* 25.5*  MCV 80.5  --   --  76.5* 76.6*  PLT 187  --   --  199 221   Cardiac Enzymes: No results for input(s): CKTOTAL, CKMB, CKMBINDEX, TROPONINI in the last 168 hours. BNP (last 3 results) No results for input(s): PROBNP in the last 8760 hours. CBG: No results for input(s): GLUCAP in the last 168 hours. D-Dimer: No results for input(s): DDIMER in the last 72 hours. Hgb A1c: No results for input(s): HGBA1C in the last 72 hours. Lipid Profile: No results for input(s): CHOL, HDL, LDLCALC, TRIG, CHOLHDL, LDLDIRECT in the last 72 hours. Thyroid function studies: No results for input(s): TSH, T4TOTAL, T3FREE, THYROIDAB in the last 72 hours.  Invalid input(s): FREET3 Anemia work up: Recent Labs    09/25/20 1554  VITAMINB12 140*  FOLATE 7.5  FERRITIN 374*  TIBC 235*  IRON 14*  RETICCTPCT 2.2   Sepsis Labs: Recent Labs  Lab 09/25/20 1304 09/26/20 0641 09/27/20 0414  WBC 8.2 7.2 6.7    Microbiology Recent Results (from the past 240 hour(s))  Resp Panel by RT-PCR (Flu A&B, Covid) Nasopharyngeal Swab     Status: None   Collection Time: 09/17/20  4:20 PM   Specimen: Nasopharyngeal Swab; Nasopharyngeal(NP) swabs in vial transport medium  Result Value Ref Range Status   SARS Coronavirus 2 by RT PCR NEGATIVE NEGATIVE Final    Comment: (NOTE) SARS-CoV-2 target nucleic acids are NOT DETECTED.  The SARS-CoV-2 RNA is generally detectable in upper  respiratory specimens during the acute phase of infection. The lowest concentration of SARS-CoV-2 viral copies this assay can detect is 138 copies/mL. A negative result does not preclude SARS-Cov-2 infection and should not be used as the sole basis for treatment or other patient management decisions. A negative result may occur with  improper specimen collection/handling, submission of specimen other than nasopharyngeal swab, presence of viral mutation(s) within the areas targeted by this assay, and inadequate number of viral copies(<138  copies/mL). A negative result must be combined with clinical observations, patient history, and epidemiological information. The expected result is Negative.  Fact Sheet for Patients:  EntrepreneurPulse.com.au  Fact Sheet for Healthcare Providers:  IncredibleEmployment.be  This test is no t yet approved or cleared by the [place] FDA and  has been authorized for detection and/or diagnosis of SARS-CoV-2 by FDA under an Emergency Use Authorization (EUA). This EUA will remain  in effect (meaning this test can be used) for the duration of the COVID-19 declaration under Section 564(b)(1) of the Act, 21 U.S.C.section 360bbb-3(b)(1), unless the authorization is terminated  or revoked sooner.       Influenza A by PCR NEGATIVE NEGATIVE Final   Influenza B by PCR NEGATIVE NEGATIVE Final    Comment: (NOTE) The Xpert Xpress SARS-CoV-2/FLU/RSV plus assay is intended as an aid in the diagnosis of influenza from Nasopharyngeal swab specimens and should not be used as a sole basis for treatment. Nasal washings and aspirates are unacceptable for Xpert Xpress SARS-CoV-2/FLU/RSV testing.  Fact Sheet for Patients: EntrepreneurPulse.com.au  Fact Sheet for Healthcare Providers: IncredibleEmployment.be  This test is not yet approved or cleared by the [place] FDA and has been  authorized for detection and/or diagnosis of SARS-CoV-2 by FDA under an Emergency Use Authorization (EUA). This EUA will remain in effect (meaning this test can be used) for the duration of the COVID-19 declaration under Section 564(b)(1) of the Act, 21 U.S.C. section 360bbb-3(b)(1), unless the authorization is terminated or revoked.  Performed at [facility], [place] [address], [place], [place] [address]   Surgical PCR screen     Status: None   Collection Time: 09/18/20  9:02 AM   Specimen: Nasal Mucosa; Nasal Swab  Result Value Ref Range Status   MRSA, PCR NEGATIVE NEGATIVE Final   Staphylococcus aureus NEGATIVE NEGATIVE Final    Comment: (NOTE) The Xpert SA Assay (FDA approved for NASAL specimens in patients 5 years of age and older), is one component of a comprehensive surveillance program. It is not intended to diagnose infection nor to guide or monitor treatment. Performed at [facility], [place] [address], [place], [place] [address]     Procedures and diagnostic studies:  CT CHEST ABDOMEN PELVIS W CONTRAST  Result Date: 09/26/2020 CLINICAL DATA:  Esophageal mass, history of prostate cancer, thoracic bony metastases, epidural thoracic mass on previous MRI EXAM: CT CHEST, ABDOMEN, AND PELVIS WITH CONTRAST TECHNIQUE: Multidetector CT imaging of the chest, abdomen and pelvis was performed following the standard protocol during bolus administration of intravenous contrast. CONTRAST:  48m OMNIPAQUE IOHEXOL 350 MG/ML SOLN COMPARISON:  09/16/2020, 08/31/2020 FINDINGS: CT CHEST FINDINGS Cardiovascular: Trace pericardial effusion. The heart is not enlarged. No evidence of thoracic aortic aneurysm or dissection. Stable atherosclerosis. Mediastinum/Nodes: Irregular mural thickening within the midthoracic esophagus at the level of the carina likely corresponds to the reported history of esophageal mass, and is suspicious for neoplasm. No pathologic adenopathy within the  mediastinum, hila, or axilla. Thyroid and trachea are unremarkable. Lungs/Pleura: Hypoventilatory changes are seen at the lung bases, with trace bilateral pleural effusions. No acute airspace disease or pneumothorax. No pulmonary nodules. Musculoskeletal: Postsurgical changes are seen from interval laminectomy and posterior fusion spanning T4 through T8. Sclerotic lesions within the T5 and T6 vertebral bodies as well as the right posterior sixth rib are unchanged. There are no acute displaced fractures. Reconstructed images demonstrate no additional findings. CT ABDOMEN PELVIS FINDINGS Hepatobiliary: Within the right lobe liver image 54/2  there is a 2.9 x 2.0 cm hypodensity which has developed since prior study, highly concerning for metastatic disease. Numerous other punctate less than 3 mm hypodensities within the remainder of the liver are indeterminate. No intrahepatic duct dilation. Small calcified gallstone without evidence of acute cholecystitis. Pancreas: Unremarkable. No pancreatic ductal dilatation or surrounding inflammatory changes. Spleen: Normal in size without focal abnormality. Adrenals/Urinary Tract: Chronic thickening of the lateral limb of the left adrenal gland measuring up to 11 mm, unchanged since 2015 and consistent with adenoma or hyperplasia. The right adrenals unremarkable. Stable small left renal cyst. No urinary tract calculi or obstructive uropathy. The bladder is unremarkable. Stomach/Bowel: There is an apple-core type lesion involving the distal sigmoid colon, with irregular circumferential wall thickening. This mass extends approximately 4.3 cm in length, and measures approximately 5.0 x 4.9 cm in cross-sectional diameter. This is highly concerning for colonic neoplasm. There is moderate retained stool within the more proximal colon, likely due to partial colonic obstruction. No evidence of small-bowel obstruction. Vascular/Lymphatic: Epiphrenic lymphadenopathy is identified, with  lymph node mass anterior to the right crus of the diaphragm measuring up to 3.7 x 2.3 cm reference image 56/2. Lymphadenopathy within the gastrohepatic ligament measures up to 1.3 cm in short axis reference image 55/2. Retroperitoneal lymphadenopathy in the left para-aortic region measures up to 1.2 cm in short axis reference image 67/2. Numerous other enlarged retroperitoneal lymph nodes are seen within the aortocaval region as well. Findings are consistent with nodal metastases. There is diffuse atherosclerosis of the aorta. There is high-grade greater than 90% stenosis within the left common iliac artery. Moderate stenosis is seen within the right common iliac artery estimated 50%. Reproductive: Prostate is surgically absent. Other: Trace free fluid within the pelvis. No free intraperitoneal gas. No abdominal wall hernia. Musculoskeletal: Unremarkable right hip arthroplasty. 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 No acute or destructive bony lesions. Reconstructed images demonstrate no additional findings. IMPRESSION: 1. Irregular circumferential wall thickening within the midthoracic esophagus consistent with the esophageal mass described in history. Findings are highly suspicious for neoplasm. 2. Circumferential apple-core type mass involving the distal sigmoid colon compatible with colonic malignancy. Colonoscopy recommended if not previously performed. There is an element of partial colonic obstruction with significant proximal colonic dilation and fecal retention. 3. Extensive lymphadenopathy within the abdomen and pelvis as above, largest lymph node mass in the upper phrenic region, consistent with nodal metastases. 4. Hypodensities within the liver, largest in the right lobe suspicious for metastatic disease. 5. Trace bilateral pleural effusions. 6. Postsurgical changes from recent thoracic fusion and laminectomy. Stable bony metastases involving the thoracic spine and right  posterior sixth rib. Please refer to previous MRI discussion. 7. 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 8. 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 Electronically Signed   By: Randa Ngo M.D.   On: 09/26/2020 17:56    Medications:    cyanocobalamin  1,000 mcg Intramuscular Daily   feeding supplement  1 Container Oral TID BM   pantoprazole (PROTONIX) IV  40 mg Intravenous Q12H   polyethylene glycol-electrolytes  4,000 mL Oral Once   Continuous Infusions:   LOS: 0 days   Geradine Girt  [facility]   How to contact the [facility] Attending or Consulting provider [place] or covering provider during after hours [place],  for this patient?  Check the care team in [facility] and look for a) attending/consulting [facility] provider listed and b) the [facility] team listed Log into [email or web address] and use [place] universal password to access. If you do not have the password, please contact the hospital operator. Locate the [facility] provider you are looking for under [facility] and page to a number that you can be directly reached. If you still have difficulty reaching the provider, please page the [facility] (Director on Call) for the Hospitalists listed on amion for assistance.  09/27/2020, 12:20 PM

## 2020-09-27 NOTE — Progress Notes (Addendum)
HEMATOLOGY-ONCOLOGY PROGRESS NOTE  SUBJECTIVE: Marcus. Marcus [name] was admitted secondary to back pain, decreased appetite, and black stool.  Hemoglobin was 9.9 on admission.  Heme positive stools noted.  GI was consulted.  He had an EGD performed on 09/26/2020 which showed a large, fungating, and ulcerating mass in the middle third of the esophagus.  This mass was partially obstructing.  Biopsies were taken and pathology returned earlier today.  Results consistent with poorly differentiated carcinoma.  He had a CT of the abdomen/pelvis with contrast performed yesterday as well.  This showed an irregular circumferential wall thickening in the mid thoracic esophagus consistent with the esophageal mass, circumferential apple core type mass involving the distal sigmoid colon compatible colonic malignancy, extensive lymphadenopathy within the abdomen and pelvis, hypodensities within the liver, largest in the right lobe suspicious for metastatic disease.  This morning, the patient denies abdominal pain, nausea, vomiting.  He denies dysphagia.  He reports black looking stools.  No hematochezia reported.  Oncology History Overview Note  Called the available numbers this morning, his brother in law Marcus [name] answered.  Advised him to please talk to the patient ASAP because we are unable to get hold of him. I mentioned this is regarding the abnormal MRI and he needs to come to the ER for further treatment. Marcus Marcus [name] expressed understanding and he will try to get hold of Marcus Marcus [name] ASAP. The urgency of the matter was clearly conveyed. Marcus Marcus [name] apparently talked to his room mate and conveyed the message to his room mate that Marcus Marcus [name] need to get in touch back with him urgently and he reassured me he will relay the message to Marcus Marcus [name] ASAP      REVIEW OF SYSTEMS:   A 10 point review of systems is negative except as noted in the HPI.  I have reviewed the past medical history, past surgical history, social history and  family history with the patient and they are unchanged from previous note.   PHYSICAL EXAMINATION: ECOG PERFORMANCE STATUS: 1 - Symptomatic but completely ambulatory  Vitals:   09/26/20 2106 09/27/20 0516  BP: (!) 159/66 134/61  Pulse: 75 82  Resp: 20 20  Temp: 98.7 F (37.1 C) 99.2 F (37.3 C)  SpO2: 100% 100%   Filed Weights   09/25/20 0617 09/26/20 1245  Weight: 70.7 kg 74.8 kg    Intake/Output from previous day: 09/12 0701 - 09/13 0700 In: 318.6 [I.V.:318.6] Out: 1 [Urine:1]  GENERAL:alert, no distress and comfortable SKIN: skin color, texture, turgor are normal, no rashes or significant lesions EYES: normal, Conjunctiva are pink and non-injected, sclera clear OROPHARYNX:no exudate, no erythema and lips, buccal mucosa, and tongue normal  LUNGS: clear to auscultation and percussion with normal breathing effort HEART: regular rate & rhythm and no murmurs and no [name] extremity edema ABDOMEN:abdomen soft, non-tender and normal bowel sounds NEURO: alert & oriented x 3 with fluent speech, no focal motor/sensory deficits  LABORATORY DATA:  I have reviewed the data as listed CMP Latest Ref Rng & Units 09/27/2020 09/26/2020 09/25/2020  Glucose 70 - 99 mg/dL 107(H) 129(H) 91  BUN 8 - 23 mg/dL 7(L) 9 16  Creatinine 0.61 - 1.24 mg/dL 0.78 0.90 0.96  Sodium 135 - 145 mmol/L 128(L) 131(L) 138  Potassium 3.5 - 5.1 mmol/L 3.5 3.2(L) 3.6  Chloride 98 - 111 mmol/L 98 101 107  CO2 22 - 32 mmol/L 21(L) 22 21(L)  Calcium 8.9 - 10.3 mg/dL 8.0(L) 7.8(L) 8.5(L)  Total Protein 6.5 -  8.1 g/dL - - 6.3(L)  Total Bilirubin 0.3 - 1.2 mg/dL - - 0.9  Alkaline Phos 38 - 126 U/L - - 67  AST 15 - 41 U/L - - 20  ALT 0 - 44 U/L - - 26    Lab Results  Component Value Date   WBC 6.7 09/27/2020   HGB 8.4 (L) 09/27/2020   HCT 25.5 (L) 09/27/2020   MCV 76.6 (L) 09/27/2020   PLT 221 09/27/2020   NEUTROABS 6.4 09/25/2020    DG Ribs Unilateral Right  Result Date: 08/31/2020 CLINICAL DATA:   [name] rib pain, back pain EXAM: RIGHT RIBS - 2 VIEW COMPARISON:  07/23/2013 FINDINGS: No fracture or other bone lesions are seen involving the ribs. IMPRESSION: Negative. Electronically Signed   By: Rolm Baptise M.D.   On: 08/31/2020 03:53   DG Ribs Unilateral W/Chest Left  Result Date: 08/31/2020 CLINICAL DATA:  [name] rib pain, back pain EXAM: LEFT RIBS AND CHEST - 3+ VIEW COMPARISON:  07/23/2013 FINDINGS: No fracture or other bone lesions are seen involving the ribs. There is no evidence of pneumothorax or pleural effusion. Both lungs are clear. Heart size and mediastinal contours are within normal limits. IMPRESSION: Negative. Electronically Signed   By: Rolm Baptise M.D.   On: 08/31/2020 03:53   DG Thoracic Spine 2 View  Result Date: 08/31/2020 CLINICAL DATA:  [name] rib pain, back pain EXAM: THORACIC SPINE 2 VIEWS COMPARISON:  None. FINDINGS: There is no evidence of thoracic spine fracture. Alignment is normal. No other significant bone abnormalities are identified. IMPRESSION: Negative. Electronically Signed   By: Rolm Baptise M.D.   On: 08/31/2020 03:52   DG Lumbar Spine 2-3 Views  Result Date: 09/18/2020 CLINICAL DATA:  Elective surgery. EXAM: LUMBAR SPINE - 2-3 VIEW COMPARISON:  Prior thoracic spine imaging. FINDINGS: Two lateral coned spot views of the spine obtained in the operating room. Pedicle screws are seen at 2 contiguous levels as well as the more superior level. Levels are not well delineated on these coned operative views. Fluoroscopy time 1 minute 6 seconds. IMPRESSION: Procedural fluoroscopy for spine surgery, recommend correlation with operative report for details. Electronically Signed   By: Keith Rake M.D.   On: 09/18/2020 18:36   CT Chest Wo Contrast  Result Date: 08/31/2020 CLINICAL DATA:  Chest pain or shortness of breath with pleurisy or effusion suspected. Bilateral chest wall pain and midthoracic pain for 1 month EXAM: CT CHEST WITHOUT CONTRAST TECHNIQUE:  Multidetector CT imaging of the chest was performed following the standard protocol without IV contrast. COMPARISON:  None similar FINDINGS: Cardiovascular: Normal heart size. Small volume pericardial fluid. Scattered atheromatous plaque with a low-density plaque at the isthmus. Mediastinum/Nodes: Negative for adenopathy Lungs/Pleura: Lungs are clear. No pleural effusion or pneumothorax. Upper Abdomen: Low-density in the subcapsular right liver favoring cyst which was seen in this location to a smaller degree in 2015. There are multiple enlarged nodes at the porta hepatis/gastrohepatic ligament with nodes measuring up to 2.5 cm in length. Musculoskeletal: Reference dedicated thoracic spine CT concerning sclerotic bone lesion at T5 and T6 with canal and foraminal involvement. No acute fracture. IMPRESSION: 1. Sclerotic bone lesion at T5 and T6 with canal and foraminal infiltration, reference dedicated thoracic spine reformats. 2. Upper abdominal adenopathy. 3. 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 Electronically Signed   By: Monte Fantasia M.D.   On: 08/31/2020 08:49   Marcus THORACIC SPINE W WO CONTRAST  Result Date: 09/16/2020  CLINICAL DATA:  Cord compression. 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 Evaluate for bone metastases. EXAM: MRI THORACIC WITHOUT AND WITH CONTRAST TECHNIQUE: Multiplanar and multiecho pulse sequences of the thoracic spine were obtained without and with intravenous contrast. CONTRAST:  7.45m GADAVIST GADOBUTROL 1 MMOL/ML IV SOLN COMPARISON:  CT of the thoracic spine 08/31/2020. FINDINGS: Alignment:  No significant spondylolisthesis. Vertebrae: Osseous lesions within the posterior T5 vertebral body and T5 posterior elements, as well as T6 vertebral body and posterior elements. Involvement of the T6 vertebral body and posterior elements is extensive. In addition to involvement of the right T6 transverse process, there  is also involvement of the posterior right sixth rib. There is also involvement of the bilateral T7 pedicles/articular pillars, T7 spinous process, right T7 transverse process, and subtle involvement of the posterior T7 vertebral body. There is subtle heterogeneous enhancement of the T8 spinous process, which could reflect an additional lesion (series 23, image 8). Aditionally, there is partially ossified enhancing epidural tumor within the right posterolateral aspect of the spinal canal at the T5-T6 level. Resultant severe spinal canal stenosis with spinal cord compression. There may be subtle T2 hyperintense signal abnormality within the spinal cord at this level compatible with myelomalacia and/or focal edema (series 17, image 10). Additionally, tumor encroaches upon the right T5-T6 and T6-T7 neural foramina. Cord: Significant spinal cord compression at T5-T6 by tumor, as described above. Possible subtle T2/STIR hyperintense signal abnormality within the spinal cord at this level, which may reflect focal edema and/or myelomalacia (series 17, image 10). Paraspinal and other soft tissues: No abnormality identified within included portions of the thorax or upper abdomen/retroperitoneum. Disc levels: No more than mild disc degeneration within the thoracic spine. No significant focal disc herniation or degenerative spinal canal stenosis. Mild facet arthrosis within the [name] thoracic spine. No degenerative compressive foraminal stenosis. Impressions 1 and 2 will be called to the ordering clinician or representative by the Radiologist Assistant, and communication documented in the PACS or [employer] IMPRESSION: Osseous lesions within the T5, T6, T7 and possibly T8 vertebra, as well as right 6th rib as detailed. These findings are highly suspicious for osseous metastatic disease given the patient's history of prostate cancer. Additionally, there is partially ossified epidural tumor within the right dorsal aspect  of the spinal canal at T5-T6, with resultant severe spinal canal stenosis and significant spinal cord compression. Subtle signal abnormality is questioned within the spinal cord at this level, and this may reflect focal edema and/or myelomalacia. Tumor also encroaches upon the right T5-T6 and T6-T7 neural foramina. Electronically Signed   By: KKellie SimmeringD.O.   On: 09/16/2020 20:04   CT CHEST ABDOMEN PELVIS W CONTRAST  Result Date: 09/26/2020 CLINICAL DATA:  Esophageal mass, history of prostate cancer, thoracic bony metastases, epidural thoracic mass on previous MRI EXAM: CT CHEST, ABDOMEN, AND PELVIS WITH CONTRAST TECHNIQUE: Multidetector CT imaging of the chest, abdomen and pelvis was performed following the standard protocol during bolus administration of intravenous contrast. CONTRAST:  819mOMNIPAQUE IOHEXOL 350 MG/ML SOLN COMPARISON:  09/16/2020, 08/31/2020 FINDINGS: CT CHEST FINDINGS Cardiovascular: Trace pericardial effusion. The heart is not enlarged. No evidence of thoracic aortic aneurysm or dissection. Stable atherosclerosis. Mediastinum/Nodes: Irregular mural thickening within the midthoracic esophagus at the level of the carina likely corresponds to the reported history of esophageal mass, and is suspicious for neoplasm. No pathologic adenopathy within the mediastinum, hila, or axilla. Thyroid and trachea are unremarkable. Lungs/Pleura: Hypoventilatory changes are seen at the lung bases, with  trace bilateral pleural effusions. No acute airspace disease or pneumothorax. No pulmonary nodules. Musculoskeletal: Postsurgical changes are seen from interval laminectomy and posterior fusion spanning T4 through T8. Sclerotic lesions within the T5 and T6 vertebral bodies as well as the right posterior sixth rib are unchanged. There are no acute displaced fractures. Reconstructed images demonstrate no additional findings. CT ABDOMEN PELVIS FINDINGS Hepatobiliary: Within the right lobe liver image 54/2 there  is a 2.9 x 2.0 cm hypodensity which has developed since prior study, highly concerning for metastatic disease. Numerous other punctate less than 3 mm hypodensities within the remainder of the liver are indeterminate. No intrahepatic duct dilation. Small calcified gallstone without evidence of acute cholecystitis. Pancreas: Unremarkable. No pancreatic ductal dilatation or surrounding inflammatory changes. Spleen: Normal in size without focal abnormality. Adrenals/Urinary Tract: Chronic thickening of the lateral limb of the left adrenal gland measuring up to 11 mm, unchanged since 2015 and consistent with adenoma or hyperplasia. The right adrenals unremarkable. Stable small left renal cyst. No urinary tract calculi or obstructive uropathy. The bladder is unremarkable. Stomach/Bowel: There is an apple-core type lesion involving the distal sigmoid colon, with irregular circumferential wall thickening. This mass extends approximately 4.3 cm in length, and measures approximately 5.0 x 4.9 cm in cross-sectional diameter. This is highly concerning for colonic neoplasm. There is moderate retained stool within the more proximal colon, likely due to partial colonic obstruction. No evidence of small-bowel obstruction. Vascular/Lymphatic: Epiphrenic lymphadenopathy is identified, with lymph node mass anterior to the right crus of the diaphragm measuring up to 3.7 x 2.3 cm reference image 56/2. Lymphadenopathy within the gastrohepatic ligament measures up to 1.3 cm in short axis reference image 55/2. Retroperitoneal lymphadenopathy in the left para-aortic region measures up to 1.2 cm in short axis reference image 67/2. Numerous other enlarged retroperitoneal lymph nodes are seen within the aortocaval region as well. Findings are consistent with nodal metastases. There is diffuse atherosclerosis of the aorta. There is high-grade greater than 90% stenosis within the left common iliac artery. Moderate stenosis is seen within the  right common iliac artery estimated 50%. Reproductive: Prostate is surgically absent. Other: Trace free fluid within the pelvis. No free intraperitoneal gas. No abdominal wall hernia. Musculoskeletal: Unremarkable right hip arthroplasty. 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 No acute or destructive bony lesions. Reconstructed images demonstrate no additional findings. IMPRESSION: 1. Irregular circumferential wall thickening within the midthoracic esophagus consistent with the esophageal mass described in history. Findings are highly suspicious for neoplasm. 2. Circumferential apple-core type mass involving the distal sigmoid colon compatible with colonic malignancy. Colonoscopy recommended if not previously performed. There is an element of partial colonic obstruction with significant proximal colonic dilation and fecal retention. 3. Extensive lymphadenopathy within the abdomen and pelvis as above, largest lymph node mass in the upper phrenic region, consistent with nodal metastases. 4. Hypodensities within the liver, largest in the right lobe suspicious for metastatic disease. 5. Trace bilateral pleural effusions. 6. Postsurgical changes from recent thoracic fusion and laminectomy. 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 Please refer to previous MRI discussion. 7. 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 8. 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 Electronically Signed   By: Randa Ngo M.D.   On: 09/26/2020 17:56   CT T-SPINE NO CHARGE  Result Date: 08/31/2020  CLINICAL DATA:  Back pain for a month EXAM: CT THORACIC SPINE WITHOUT CONTRAST TECHNIQUE: Multidetector CT images of the thoracic were obtained using the standard protocol without intravenous contrast.  COMPARISON:  No similar CT comparison FINDINGS: Alignment: Normal Vertebrae: Sclerosis in the vertebral bodies of T5, T7, especially T6. At T6 there is extension in to right-sided pedicle and posterior elements with extraosseous partially ossified tissue effacing the thecal sac from posterolateral and involving the right T5-6 and T6-7 foramina. Negative for fracture deformity. Paraspinal and other soft tissues: Reported separately Disc levels: Less than typical degenerative changes in the spine. IMPRESSION: Sclerotic bone lesion at T5-T7, greatest at T6 where there is posterior element involvement with right-sided epidural tumor and right T5-6, T6-7 foraminal infiltration. Cord deformation/compression is present at T6. There is history of prostate cancer in the past. Electronically Signed   By: Monte Fantasia M.D.   On: 08/31/2020 08:53   DG C-Arm 1-60 Min-No Report  Result Date: 09/18/2020 Fluoroscopy was utilized by the requesting physician.  No radiographic interpretation.    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 Tresa Moore.  Had extracapsular extension and positive resection margins. -Status post postoperative radiation therapy by Dr. Tammi Klippel. -Lost to follow-up after treatment.   2) T5-T7 sclerotic bone lesion involving posterior elements with right-sided epidural tumor and T5-6 T6-7 foraminal infiltration. 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  3) poorly differentiated carcinoma of the esophagus diagnosed 09/26/2020  4) apple core type mass of the distal sigmoid  colon noted on CT scan from 09/26/2020  5) extensive lymphadenopathy in the abdomen and hypodensities in the liver concerning for metastatic disease  6) microcytic anemia secondary to GI bleed, B12 deficiency -Labs from 09/25/2020-ferritin 374, iron 14, TIBC 235, percent saturation 6%, folate 7.5, vitamin B12 140   7) active smoker half a pack per day 8) poor medical follow-up 9) family history of GI malignancy -Reported sister with colon malignancy  PLAN: -Discussed biopsy from laminectomy with the patient.  Biopsy was consistent with current, metastatic prostate cancer.  Will need follow-up with radiation oncology.  We will also consider him for Lupron therapy as an outpatient. -He has a new diagnosis of esophageal cancer.  He is also noted to have extensive lymphadenopathy in his abdomen and liver lesions concerning for metastatic disease.  Unclear if these areas are due to metastatic esophageal cancer or if related to the colon mass which is still undergoing work-up (see below).  Will need to consider systemic therapy once work-up complete. -The patient was noted to have a colon mass noted on CT scan.  He is scheduled for colonoscopy on 9/14.  Discussed with GI who does plan to have general surgery evaluate the patient.  Will await biopsy.  We will check a CEA with a.m. lab work.  We will make plans for systemic therapy once all work-up completed. -May be reasonable to engage palliative care given that the patient has 2 confirmed metastatic (and possibly a third) malignancy for goals of care discussion. -The patient reports that he had a sister who died from a GI malignancy.  06-30-2022 consider him for  genetic counseling as an outpatient. -Monitor hemoglobin closely.  Transfuse for hemoglobin less than 7.5 -Vitamin B12 is being replaced.   LOS: 0 days   Mikey Bussing, DNP, AGPCNP-BC, AOCNP 09/27/20  ADDENDUM  Patient was Personally and independently interviewed, examined and relevant  elements of the history of present illness were reviewed in details and an assessment and plan was created. All elements of the patient's history of present illness , assessment and plan were discussed in details with Mikey Bussing, DNP, AGPCNP-BC, AOCNP. The above documentation reflects our combined findings assessment and plan.   Sullivan Lone MD MS

## 2020-09-27 NOTE — Anesthesia Preprocedure Evaluation (Addendum)
Anesthesia Evaluation  Patient identified by MRN, date of birth, ID band  Reviewed: Allergy & Precautions, NPO status , Patient's Chart, lab work & pertinent test results  Airway Mallampati: II  TM Distance: >3 FB Neck ROM: Full    Dental no notable dental hx. (+) Edentulous Upper, Missing, Dental Advisory Given,    Pulmonary neg pulmonary ROS, Current Smoker,    Pulmonary exam normal breath sounds clear to auscultation       Cardiovascular hypertension, Pt. on medications Normal cardiovascular exam Rhythm:Regular Rate:Normal     Neuro/Psych negative neurological ROS  negative psych ROS   GI/Hepatic negative GI ROS, Neg liver ROS,   Endo/Other  negative endocrine ROS  Renal/GU prostate cancer with bone mets  Lab Results      Component                Value               Date                      CREATININE               0.78                09/27/2020                BUN                      7 (L)               09/27/2020                NA                       128 (L)             09/27/2020                K                        3.5                 09/27/2020                CL                       98                  09/27/2020                CO2                      21 (L)              09/27/2020             negative genitourinary   Musculoskeletal negative musculoskeletal ROS (+)   Abdominal   Peds negative pediatric ROS (+)  Hematology  (+) anemia , Lab Results      Component                Value               Date                      WBC  6.7                 09/27/2020                HGB                      8.4 (L)             09/27/2020                HCT                      25.5 (L)            09/27/2020                MCV                      76.6 (L)            09/27/2020                PLT                      221                 09/27/2020              Anesthesia Other  Findings   Reproductive/Obstetrics negative OB ROS                            Anesthesia Physical Anesthesia Plan  ASA: 3  Anesthesia Plan: MAC   Post-op Pain Management:    Induction:   PONV Risk Score and Plan: Treatment may vary due to age or medical condition  Airway Management Planned: Natural Airway and Nasal Cannula  Additional Equipment: None  Intra-op Plan:   Post-operative Plan:   Informed Consent: I have reviewed the patients History and Physical, chart, labs and discussed the procedure including the risks, benefits and alternatives for the proposed anesthesia with the patient or authorized representative who has indicated his/her understanding and acceptance.     Dental advisory given  Plan Discussed with: CRNA  Anesthesia Plan Comments:        Anesthesia Quick Evaluation

## 2020-09-27 NOTE — TOC Progression Note (Signed)
Transition of Care [facility]) - Progression Note    Patient Details  Name: Marcus [name] MRN: [id number] Date of Birth: 09/13/1945  Transition of Care [facility]) CM/SW Contact  Purcell Mouton, RN Phone Number: 09/27/2020, 12:58 PM  Clinical Narrative:    Pt is active with [facility] for [place].      Barriers to Discharge: No Barriers Identified  Expected Discharge Plan and Services                                                 Social Determinants of Health (SDOH) Interventions    Readmission Risk Interventions No flowsheet data found.

## 2020-09-27 NOTE — Progress Notes (Signed)
Consent for colonoscopy on 9/14 obtained and placed in pts chart.

## 2020-09-27 NOTE — H&P (View-Only) (Signed)
[facility] Gastroenterology Progress Note  Marcus [name] 75 y.o. [birth year]  CC:  Melena and Anemia   Subjective: Patient denies dysphagia, globulus sensation, odynophagia.  Has had frequent coughing and throat clearing.  Patient's last BM was yesterday morning, small volume, melena with hematochezia. Has been having small volume stools for 1-2 weeks.  Denies nausea, vomiting, AB pain.  Patient denies fever, chills.    Review of Systems  Constitutional:  Positive for malaise/fatigue and weight loss. Negative for chills and fever.  HENT:  Negative for sore throat.   Respiratory:  Negative for shortness of breath.   Cardiovascular:  Negative for chest pain and leg swelling.  Gastrointestinal:  Positive for blood in stool, constipation and melena. Negative for abdominal pain, diarrhea, heartburn, nausea and vomiting.  Musculoskeletal:  Negative for falls.  Skin:  Negative for itching.  Neurological:  Positive for dizziness. Negative for focal weakness.     Objective: Vital signs in last 24 hours: Vitals:   09/26/20 2106 09/27/20 0516  BP: (!) 159/66 134/61  Pulse: 75 82  Resp: 20 20  Temp: 98.7 F (37.1 C) 99.2 F (37.3 C)  SpO2: 100% 100%    Physical Exam Constitutional:      General: He is awake. He is not in acute distress.    Appearance: He is underweight.  HENT:     Mouth/Throat:     Comments: Poor dentition Eyes:     General: No scleral icterus. Abdominal:     General: Abdomen is flat. Bowel sounds are normal. There is no distension.     Palpations: Abdomen is soft.     Tenderness: There is no abdominal tenderness. There is no guarding or rebound.  Musculoskeletal:     Comments: Walks with walker, slow to transition in bed  Psychiatric:        Behavior: Behavior is cooperative.     Lab Results: Recent Labs    09/26/20 0641 09/27/20 0414  NA 131* 128*  K 3.2* 3.5  CL 101 98  CO2 22 21*  GLUCOSE 129* 107*  BUN 9 7*  CREATININE 0.90 0.78  CALCIUM  7.8* 8.0*   Recent Labs    09/25/20 1304  AST 20  ALT 26  ALKPHOS 67  BILITOT 0.9  PROT 6.3*  ALBUMIN 3.1*   Recent Labs    09/25/20 1304 09/25/20 1554 09/26/20 0641 09/27/20 0414  WBC 8.2  --  7.2 6.7  NEUTROABS 6.4  --   --   --   HGB 9.9*   < > 8.2* 8.4*  HCT 31.4*   < > 25.1* 25.5*  MCV 80.5  --  76.5* 76.6*  PLT 187  --  199 221   < > = values in this interval not displayed.   Recent Labs    09/25/20 1554  LABPROT 14.2  INR 1.1   CT chest/AB/Pelvis 09/26/2020 IMPRESSION: 1. Irregular circumferential wall thickening within the midthoracic esophagus consistent with the esophageal mass described in history. Findings are highly suspicious for neoplasm. 2. Circumferential apple-core type mass involving the distal sigmoid colon compatible with colonic malignancy. Colonoscopy recommended if not previously performed. There is an element of partial colonic obstruction with significant proximal colonic dilation and fecal retention. 3. Extensive lymphadenopathy within the abdomen and pelvis as above, largest lymph node mass in the upper phrenic region, consistent with nodal metastases. 4. Hypodensities within the liver, largest in the right lobe suspicious for metastatic disease. 5. Trace bilateral pleural effusions. 6. Postsurgical changes  from recent thoracic fusion and laminectomy. 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 Please refer to previous MRI discussion. 7. 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 8. 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  Continue Protonix 40 mg IV BID. Oncology consults May want to consider genetic testing  Sigmoid colonic mass seen on CT Patient clear liquids NPO at midnight Nulytely Prep   I thoroughly discussed the procedure with the patient (at bedside) to include nature of the procedure, alternatives, benefits, and risks (including but not limited to bleeding, infection, perforation, anesthesia/cardiac pulmonary complications).  Patient verbalized understanding and gave verbal consent to proceed with Colonoscopy.  Anemia  -HGB 8.4, Iron is low but Ferritin and TIBC are elevated, likely anemia of chronic disease B12 low on B12 replacement Continue to monitor H&H with transfusion as needed to maintain hemoglobin greater than 7.   Prostate cancer with mets    [facility] will follow.     Bayley Radford Pax PA-C 09/27/2020, 7:56 AM  Contact #  [phone]

## 2020-09-28 ENCOUNTER — Inpatient Hospital Stay (HOSPITAL_COMMUNITY): Payer: Medicare Other | Admitting: Anesthesiology

## 2020-09-28 ENCOUNTER — Encounter (HOSPITAL_COMMUNITY): Payer: Self-pay | Admitting: Internal Medicine

## 2020-09-28 ENCOUNTER — Encounter (HOSPITAL_COMMUNITY)
Admission: EM | Disposition: A | Discharge status: Skilled Nursing Facility | Payer: Self-pay | Source: Home / Self Care | Attending: Hospitalist

## 2020-09-28 DIAGNOSIS — K2289 Other specified disease of esophagus: Secondary | ICD-10-CM

## 2020-09-28 DIAGNOSIS — K6389 Other specified diseases of intestine: Secondary | ICD-10-CM

## 2020-09-28 DIAGNOSIS — C187 Malignant neoplasm of sigmoid colon: Secondary | ICD-10-CM

## 2020-09-28 DIAGNOSIS — Z7189 Other specified counseling: Secondary | ICD-10-CM

## 2020-09-28 HISTORY — PX: SUBMUCOSAL TATTOO INJECTION: SHX6856

## 2020-09-28 HISTORY — PX: FLEXIBLE SIGMOIDOSCOPY: SHX5431

## 2020-09-28 HISTORY — PX: BIOPSY: SHX5522

## 2020-09-28 LAB — BASIC METABOLIC PANEL
Anion gap: 8 (ref 5–15)
BUN: 6 mg/dL — ABNORMAL LOW (ref 8–23)
CO2: 22 mmol/L (ref 22–32)
Calcium: 8 mg/dL — ABNORMAL LOW (ref 8.9–10.3)
Chloride: 102 mmol/L (ref 98–111)
Creatinine, Ser: 0.98 mg/dL (ref 0.61–1.24)
GFR, Estimated: >60 mL/min (ref >60)
Glucose, Bld: 122 mg/dL — ABNORMAL HIGH (ref 70–99)
Potassium: 3.2 mmol/L — ABNORMAL LOW (ref 3.5–5.1)
Sodium: 132 mmol/L — ABNORMAL LOW (ref 135–145)

## 2020-09-28 LAB — SURGICAL PATHOLOGY

## 2020-09-28 SURGERY — BIOPSY
Anesthesia: Monitor Anesthesia Care

## 2020-09-28 MED ORDER — LACTATED RINGERS IV SOLN
INTRAVENOUS | Status: DC
  Administered 2020-09-28: 1000 mL via INTRAVENOUS

## 2020-09-28 MED ORDER — LIDOCAINE 2% (20 MG/ML) 5 ML SYRINGE
INTRAMUSCULAR | Status: DC | PRN
  Administered 2020-09-28: 40 mg via INTRAVENOUS

## 2020-09-28 MED ORDER — POTASSIUM CHLORIDE CRYS ER 20 MEQ PO TBCR
40.0000 meq | EXTENDED_RELEASE_TABLET | Freq: Once | ORAL | Status: AC
  Administered 2020-09-28: 40 meq via ORAL
  Filled 2020-09-28: qty 2

## 2020-09-28 MED ORDER — SPOT INK MARKER SYRINGE KIT
PACK | SUBMUCOSAL | Status: AC
  Filled 2020-09-28: qty 5

## 2020-09-28 MED ORDER — SPOT INK MARKER SYRINGE KIT
PACK | SUBMUCOSAL | Status: DC | PRN
  Administered 2020-09-28: 2.5 mL via SUBMUCOSAL

## 2020-09-28 MED ORDER — PROPOFOL 500 MG/50ML IV EMUL
INTRAVENOUS | Status: DC | PRN
  Administered 2020-09-28: 125 ug/kg/min via INTRAVENOUS

## 2020-09-28 MED ORDER — PROPOFOL 10 MG/ML IV BOLUS
INTRAVENOUS | Status: DC | PRN
  Administered 2020-09-28: 40 mg via INTRAVENOUS

## 2020-09-28 MED ORDER — SODIUM CHLORIDE 0.9 % IV SOLN: INTRAVENOUS | Status: DC

## 2020-09-28 MED ORDER — LACTATED RINGERS IV SOLN: INTRAVENOUS | Status: DC | PRN

## 2020-09-28 NOTE — Op Note (Signed)
[facility] Patient Name: Marcus [name] Procedure Date: 09/28/2020 MRN: [id number] Attending MD: Ronnette Juniper , MD Date of Birth: 1945/12/15 CSN: [id number] Age: 75 Admit Type: Inpatient Procedure:                Flexible Sigmoidoscopy Indications:              Abnormal CT of the GI tract Providers:                Ronnette Juniper, MD, Jeanella Cara, RN, Tyna Jaksch Technician Referring MD:             [facility] Medicines:                Propofol per Anesthesia Complications:            No immediate complications. Estimated blood loss:                            Minimal. Estimated Blood Loss:     Estimated blood loss was minimal. Procedure:                Pre-Anesthesia Assessment:                           - Prior to the procedure, a History and Physical                            was performed, and patient medications and                            allergies were reviewed. The patient's tolerance of                            previous anesthesia was also reviewed. The risks                            and benefits of the procedure and the sedation                            options and risks were discussed with the patient.                            All questions were answered, and informed consent                            was obtained. Prior Anticoagulants: The patient has                            taken no previous anticoagulant or antiplatelet                            agents. ASA Grade Assessment: III - A patient with  severe systemic disease. After reviewing the risks                            and benefits, the patient was deemed in                            satisfactory condition to undergo the procedure.                           After obtaining informed consent, the scope was                            passed under direct vision. The [id number]-[id number]                            [id number] Olympus  colonoscope was introduced                            through the anus and advanced to the the sigmoid                            colon. After obtaining informed consent, the scope                            was passed under direct vision. The flexible                            sigmoidoscopy was accomplished without difficulty.                            The patient tolerated the procedure well. The                            quality of the bowel preparation was poor. Scope In: 8:19:12 AM Scope Out: 8:29:52 AM Total Procedure Duration: 0 hours 10 minutes 40 seconds  Findings:      The perianal and digital rectal examinations were normal.      A frond-like/villous, fungating, infiltrative, polypoid and sessile       completely obstructing large mass was found at 15 cm proximal to the       anus. The mass was circumferential (involving 100% of the lumen       circumference). No bleeding was present. This was biopsied with a cold       forceps for histology. Area was tattooed with an injection of 2 mL of       Spot (carbon black).      The scope could not be advanced beyond this mass and proximal colon       could not be evaluated. Impression:               - Preparation of the colon was poor.                           - Likely malignant completely obstructing tumor at  15 cm proximal to the anus. Biopsied. Tattooed. Moderate Sedation:      Patient did not receive moderate sedation for this procedure, but       instead received monitored anesthesia care. Recommendation:           - Clear liquid diet.                           - Await pathology results.                           - Refer to a Psychologist, sport and exercise. Procedure Code(s):        --- Professional ---                           [phone], Sigmoidoscopy, flexible; with biopsy, single                            or multiple                           [id number], Sigmoidoscopy, flexible; with directed                             submucosal injection(s), any substance Diagnosis Code(s):        --- Professional ---                           D49.0, Neoplasm of unspecified behavior of                            digestive system                           K56.691, Other complete intestinal obstruction                           R93.3, Abnormal findings on diagnostic imaging of                            other parts of digestive tract CPT copyright 2019 [employer]. All rights reserved. The codes documented in this report are preliminary and upon coder review may  be revised to meet current compliance requirements. Ronnette Juniper, MD 09/28/2020 8:38:10 AM This report has been signed electronically. Number of Addenda: 0

## 2020-09-28 NOTE — Transfer of Care (Signed)
Immediate Anesthesia Transfer of Care Note  Patient: Marcus [name]  Procedure(s) Performed: BIOPSY SUBMUCOSAL TATTOO INJECTION FLEXIBLE SIGMOIDOSCOPY  Patient Location: PACU and Endoscopy Unit  Anesthesia Type:MAC  Level of Consciousness: sedated  Airway & Oxygen Therapy: Patient Spontanous Breathing and Patient connected to face mask oxygen  Post-op Assessment: Report given to RN and Post -op Vital signs reviewed and stable  Post vital signs: Reviewed and stable  Last Vitals:  Vitals Value Taken Time  BP 148/62 09/28/20 0901  Temp 37.4 C 09/28/20 0840  Pulse 69 09/28/20 0903  Resp 19 09/28/20 0902  SpO2 99 % 09/28/20 0903  Vitals shown include unvalidated device data.  Last Pain:  Vitals:   09/28/20 0840  TempSrc: Axillary  PainSc:          Complications: No notable events documented.

## 2020-09-28 NOTE — Consult Note (Signed)
Consult Note  Marcus [name] [birth year]  [id number].    Requesting MD: Dr. Ronnette Juniper  Chief Complaint/Reason for Consult: obstructing sigmoid mass HPI:  Patient is a 75 year old male with PMH significant for prostate cancer that was treated with robotic assisted laparoscopic radical prostatectomy by Dr. Tresa Moore in 2015 and radiation in 2016. Patient's surgical margins were positive at that time but he had no positive lymph nodes on pathology. Patient reports he underwent 6 weeks of daily radiation. Patient then became lost to follow up. 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 Patient was seen in the ED for dizziness and concern for possible blood in stool in July of this year, and he was worked up for possible stroke which was negative. Rectal exam at that time revealed yellow appearing stool without obvious blood. Outpatient GI follow up was recommended. Patient returned to the ED 08/31/20 with complaints of back pain and rib pain x1 month, workup was revealing for sclerotic bone lesion at T5 and T6 concerning for malignancy given hx of prostate cancer. Patient was referred to oncology and saw Dr. Irene Limbo in the office 9/1, MRI of the thoracic spine was ordered which showed osseous lesions at T5-T8  and right 6th rib with concern for cord compression at T5-T6 level. Patient presented to ED 9/3 with difficulty ambulating and neurosurgery was consulted at that time. 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 PSA on 9/1 elevated to 28.7. Patient was discharged 9/8 from the hospital. Patient returned to the hospital 9/11 with increased back pain and poor PO intake with dark stools. Patient was admitted and GI consulted for concern for possible UGI bleeding. Patient underwent EGD 9/12 and non-obstructing esophageal mass noted in  middle third of esophagus and changes consistent with Barrett's esophagus noted in distal third of esophagus - pathology from this showed poorly differentiated carcinoma. CT C/A/P obtained after this which also showed circumferential apple-core type mass in the distal sigmoid colon with element of partial colonic obstruction. Patient underwent colonoscopy earlier today which showed obstructing tumor 15 cm from anus that was unable to be traversed by the colonoscope. We were asked to see.   Patient currently denies abdominal pain, nausea or vomiting. He reports that over the last several months he has noticed some darker and thinner stools and that he has had to strain more to have bowel movements. He reports subjective unintentional weight loss although he is unsure how much weight, but notes that pants are looser fitting than normal. He has never had a colonoscopy. He only recently established care with a PCP but has not reliably had a PCP prior to this it seems. He reports he had a sister with hx of GI malignancy who died but it unaware of other family members with GI malignancies. Patient smokes 1/2 ppd currently and has been smoking since he was a teenager. He drinks 1-2 6 packs of beer per week and reports he drinks daily. He denies illicit drug use. He is not currently working. He lives with roommates but is independent. He reports he does have some family locally and that he has 3 daughters. Patient denies other previous abdominal surgery other than prostatectomy. Patient is not on any blood thinners but has been taking ibuprofen for pain control after back surgery. NKDA. Patient expressed  today that he wants to live but understands that he has metastatic cancer. He is agreeable to speak with palliative care and outline what is important to him in terms of goals and quality of life. He understands that any surgical intervention we offer is for symptom management at this point and would not be curative of  metastatic prostate cancer.    ROS: Review of Systems  Constitutional:  Positive for weight loss (pants fitting looser). Negative for chills and fever.  Respiratory:  Negative for shortness of breath and wheezing.   Cardiovascular:  Negative for chest pain, palpitations and leg swelling.  Gastrointestinal:  Positive for constipation and melena. Negative for abdominal pain, blood in stool, diarrhea, nausea and vomiting.       Bloating and having to strain with BMs, thin stools   Genitourinary:  Negative for dysuria, frequency and urgency.       +hesitancy and darkened urine intermittently  Musculoskeletal:  Positive for back pain and joint pain.  All other systems reviewed and are negative.  Family History  Problem Relation Age of Onset   Seizures Mother    Cancer Father        throat cancer    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    Prostate cancer ([place]) 09/04/13   Adenocarcinoma    Past Surgical History:  Procedure Laterality Date   BIOPSY  09/26/2020   Procedure: BIOPSY;  Surgeon: Ronnette Juniper, MD;  Location: [facility];  Service: Gastroenterology;;   ESOPHAGOGASTRODUODENOSCOPY (EGD) WITH PROPOFOL N/A 09/26/2020   Procedure: ESOPHAGOGASTRODUODENOSCOPY (EGD) WITH PROPOFOL;  Surgeon: Ronnette Juniper, MD;  Location: [facility];  Service: Gastroenterology;  Laterality: N/A;   LAMINECTOMY N/A 09/18/2020   Procedure: THORACIC LAMINECTOMY FOR TUMOR;  Surgeon: Newman Pies, MD;  Location: [facility];  Service: Neurosurgery;  Laterality: N/A;   LYMPHADENECTOMY Bilateral 09/04/2013   Procedure: PELVIC LYMPH NODE DISSECTION;  Surgeon: Alexis Frock, MD;  Location: [facility];  Service: Urology;  Laterality: Bilateral;   ROBOT ASSISTED LAPAROSCOPIC RADICAL PROSTATECTOMY N/A 09/04/2013   Procedure: ROBOTIC ASSISTED  LAPAROSCOPIC RADICAL PROSTATECTOMY, INDOCYANINE GREEN DYE;  Surgeon: Alexis Frock, MD;  Location: [facility];  Service: Urology;  Laterality: N/A;   TOTAL HIP ARTHROPLASTY Right 05/15/2016   Procedure: RIGHT TOTAL HIP ARTHROPLASTY ANTERIOR APPROACH;  Surgeon: Paralee Cancel, MD;  Location: [facility];  Service: Orthopedics;  Laterality: Right;   WRIST GANGLION EXCISION      Social History:  reports that he has been smoking cigarettes. He has been smoking an average of .25 packs per day. He has never used smokeless tobacco. He reports that he does not currently use alcohol. He reports that he does not use drugs.  Allergies: No Known Allergies  Medications Prior to Admission  Medication Sig Dispense Refill   acetaminophen (TYLENOL) 325 MG tablet Take 2 tablets (650 mg total) by mouth every 6 (six) hours as needed for up to 30 doses for mild pain or moderate pain. 30 tablet 0   cyclobenzaprine (FLEXERIL) 10 MG tablet Take 1 tablet (10 mg total) by mouth 3 (three) times daily as needed for muscle spasms. 30 tablet 0   IBUPROFEN IB PO Take 1 tablet by mouth daily as needed (pain).     oxyCODONE 10 MG  TABS Take 1 tablet (10 mg total) by mouth every 4 (four) hours as needed for severe pain ((score 7 to 10)). 30 tablet 0   amLODipine (NORVASC) 10 MG tablet Take 1 tablet (10 mg total) by mouth daily. (Patient not taking: No sig reported) 30 tablet 1   docusate sodium (COLACE) 100 MG capsule Take 1 capsule (100 mg total) by mouth 2 (two) times daily. (Patient not taking: No sig reported) 10 capsule 0   ferrous sulfate (FERROUSUL) 325 (65 FE) MG tablet Take 1 tablet (325 mg total) by mouth daily. (Patient not taking: No sig reported) 60 tablet 1    Blood pressure (!) 154/65, pulse 66, temperature 99.1 F (37.3 C), temperature source Oral, resp. rate 19, height 5' 10"  (1.778 m), weight 74.8 kg, SpO2 100 %. Physical Exam:  General: pleasant, WD, elderly male who is laying in bed in NAD HEENT: head is  normocephalic, atraumatic.  Sclera are noninjected. Arcus senilis present. Poor dentition with no upper teeth and only some remaining lower teeth Heart: regular, rate, and rhythm.  Normal s1,s2. No obvious murmurs, gallops, or rubs noted.  Palpable radial and pedal pulses bilaterally Lungs: CTAB, no wheezes, rhonchi, or rales noted.  Respiratory effort nonlabored Abd: soft, NT, mild distention, BS hypoactive, linear darkening of skin along midline but pt denies that this is surgical scar MS: all 4 extremities are symmetrical with no cyanosis, clubbing, or edema. Skin: warm and dry with no masses, lesions, or rashes Neuro: Cranial nerves 2-12 grossly intact, sensation is normal throughout Psych: A&Ox3 with an appropriate affect.  Surgical pathology results can be found in results - unable to include in note secondary to formatting issue.   CBC    Component Value Date/Time   WBC 6.7 09/27/2020 0414   RBC 3.33 (L) 09/27/2020 0414   HGB 8.4 (L) 09/27/2020 0414   HCT 25.5 (L) 09/27/2020 0414   PLT 221 09/27/2020 0414   MCV 76.6 (L) 09/27/2020 0414   MCH 25.2 (L) 09/27/2020 0414   MCHC 32.9 09/27/2020 0414   RDW 17.6 (H) 09/27/2020 0414   LYMPHSABS 0.8 09/25/2020 1304   MONOABS 0.8 09/25/2020 1304   EOSABS 0.1 09/25/2020 1304   BASOSABS 0.0 09/25/2020 1304   BMET    Component Value Date/Time   NA 132 (L) 09/28/2020 0441   K 3.2 (L) 09/28/2020 0441   CL 102 09/28/2020 0441   CO2 22 09/28/2020 0441   GLUCOSE 122 (H) 09/28/2020 0441   BUN 6 (L) 09/28/2020 0441   CREATININE 0.98 09/28/2020 0441   CREATININE 1.01 09/15/2020 1415   CALCIUM 8.0 (L) 09/28/2020 0441   GFRNONAA >60 09/28/2020 0441   GFRNONAA >60 09/15/2020 1415   GFRAA >60 09/17/2018 0607     CT CHEST ABDOMEN PELVIS W CONTRAST  Result Date: 09/26/2020 CLINICAL DATA:  Esophageal mass, history of prostate cancer, thoracic bony metastases, epidural thoracic mass on previous MRI EXAM: CT CHEST, ABDOMEN, AND PELVIS WITH  CONTRAST TECHNIQUE: Multidetector CT imaging of the chest, abdomen and pelvis was performed following the standard protocol during bolus administration of intravenous contrast. CONTRAST:  29m OMNIPAQUE IOHEXOL 350 MG/ML SOLN COMPARISON:  09/16/2020, 08/31/2020 FINDINGS: CT CHEST FINDINGS Cardiovascular: Trace pericardial effusion. The heart is not enlarged. No evidence of thoracic aortic aneurysm or dissection. Stable atherosclerosis. Mediastinum/Nodes: Irregular mural thickening within the midthoracic esophagus at the level of the carina likely corresponds to the reported history of esophageal mass, and is suspicious for neoplasm. No pathologic adenopathy within the  mediastinum, hila, or axilla. Thyroid and trachea are unremarkable. Lungs/Pleura: Hypoventilatory changes are seen at the lung bases, with trace bilateral pleural effusions. No acute airspace disease or pneumothorax. No pulmonary nodules. Musculoskeletal: Postsurgical changes are seen from interval laminectomy and posterior fusion spanning T4 through T8. Sclerotic lesions within the T5 and T6 vertebral bodies as well as the right posterior sixth rib are unchanged. There are no acute displaced fractures. Reconstructed images demonstrate no additional findings. CT ABDOMEN PELVIS FINDINGS Hepatobiliary: Within the right lobe liver image 54/2 there is a 2.9 x 2.0 cm hypodensity which has developed since prior study, highly concerning for metastatic disease. Numerous other punctate less than 3 mm hypodensities within the remainder of the liver are indeterminate. No intrahepatic duct dilation. Small calcified gallstone without evidence of acute cholecystitis. Pancreas: Unremarkable. No pancreatic ductal dilatation or surrounding inflammatory changes. Spleen: Normal in size without focal abnormality. Adrenals/Urinary Tract: Chronic thickening of the lateral limb of the left adrenal gland measuring up to 11 mm, unchanged since 2015 and consistent with adenoma  or hyperplasia. The right adrenals unremarkable. Stable small left renal cyst. No urinary tract calculi or obstructive uropathy. The bladder is unremarkable. Stomach/Bowel: There is an apple-core type lesion involving the distal sigmoid colon, with irregular circumferential wall thickening. This mass extends approximately 4.3 cm in length, and measures approximately 5.0 x 4.9 cm in cross-sectional diameter. This is highly concerning for colonic neoplasm. There is moderate retained stool within the more proximal colon, likely due to partial colonic obstruction. No evidence of small-bowel obstruction. Vascular/Lymphatic: Epiphrenic lymphadenopathy is identified, with lymph node mass anterior to the right crus of the diaphragm measuring up to 3.7 x 2.3 cm reference image 56/2. Lymphadenopathy within the gastrohepatic ligament measures up to 1.3 cm in short axis reference image 55/2. Retroperitoneal lymphadenopathy in the left para-aortic region measures up to 1.2 cm in short axis reference image 67/2. Numerous other enlarged retroperitoneal lymph nodes are seen within the aortocaval region as well. Findings are consistent with nodal metastases. There is diffuse atherosclerosis of the aorta. There is high-grade greater than 90% stenosis within the left common iliac artery. Moderate stenosis is seen within the right common iliac artery estimated 50%. Reproductive: Prostate is surgically absent. Other: Trace free fluid within the pelvis. No free intraperitoneal gas. No abdominal wall hernia. Musculoskeletal: Unremarkable right hip arthroplasty. 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 No acute or destructive bony lesions. Reconstructed images demonstrate no additional findings. IMPRESSION: 1. Irregular circumferential wall thickening within the midthoracic esophagus consistent with the esophageal mass described in history. Findings are highly suspicious for neoplasm. 2. Circumferential apple-core  type mass involving the distal sigmoid colon compatible with colonic malignancy. Colonoscopy recommended if not previously performed. There is an element of partial colonic obstruction with significant proximal colonic dilation and fecal retention. 3. Extensive lymphadenopathy within the abdomen and pelvis as above, largest lymph node mass in the upper phrenic region, consistent with nodal metastases. 4. Hypodensities within the liver, largest in the right lobe suspicious for metastatic disease. 5. Trace bilateral pleural effusions. 6. Postsurgical changes from recent thoracic fusion and laminectomy. 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 Please refer to previous MRI discussion. 7. 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 8. 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 Electronically Signed   By:  Randa Ngo M.D.   On: 09/26/2020 17:56      Assessment/Plan Metastatic prostate cancer - s/p radical prostatectomy and radiation therapy in 2015/2016 - s/p laminectomy for 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 Arnoldo Morale Obstructing sigmoid colon mass - while mass is unable to be traversed by scope and appears obstructing on CT, pt clinically continues to have some bowel function and is without significant abdominal pain or distention on exam - ok to continue CLD at this time but DO NOT advance past this - CEA pending, biopsies from colonoscopy today pending  - no indication for emergent surgical intervention at this time but suspect if aggressive care pursued then patient likely needs at the very least diverting colostomy. Will discuss further with surgical attending.  Circumferential non-obstructing esophageal mass - s/p EGD 9/12 and path with poorly differentiated  carcinoma - given metastatic disease, esophagectomy or resection seems unlikely to be an option   I have placed a palliative care consult to help establish GOC early on given metastatic prostate cancer with possible primary GI malignancy vs metastatic involvement of GI tract as well.   FEN: CLD, IVF per [facility] VTE: SCDs, chemical prophylaxis on hold in setting of anemia  ID: no current abx  Hx of Paget's bone disease Tobacco abuse  EtOH abuse  Symptomatic anemia in setting of GI bleeding Vitamin D deficiency  Vitamin B12 deficiency   Norm Parcel, [facility] Surgery 09/28/2020, 11:11 AM Please see Amion for pager number during day hours 7:00am-4:30pm

## 2020-09-28 NOTE — Progress Notes (Signed)
Patient s/p endo procedure See Epic for vital signs Patient awake & alert Report given to RN caring for patient

## 2020-09-28 NOTE — Anesthesia Postprocedure Evaluation (Signed)
Anesthesia Post Note  Patient: Marcus [name]  Procedure(s) Performed: BIOPSY SUBMUCOSAL TATTOO INJECTION FLEXIBLE SIGMOIDOSCOPY     Patient location during evaluation: Endoscopy Anesthesia Type: MAC Level of consciousness: awake and alert Pain management: pain level controlled Vital Signs Assessment: post-procedure vital signs reviewed and stable Respiratory status: spontaneous breathing, nonlabored ventilation, respiratory function stable and patient connected to nasal cannula oxygen Cardiovascular status: blood pressure returned to baseline and stable Postop Assessment: no apparent nausea or vomiting Anesthetic complications: no   No notable events documented.  Last Vitals:  Vitals:   09/28/20 0900 09/28/20 0901  BP: (!) 148/62 (!) 148/62  Pulse: 66 66  Resp: 20 (!) 22  Temp:    SpO2: 97% 99%    Last Pain:  Vitals:   09/28/20 0840  TempSrc: Axillary  PainSc:                  Barnet Glasgow

## 2020-09-28 NOTE — Interval H&P Note (Signed)
History and Physical Interval Note: 74/male with sigmoid mass noted on CT for a diagnostic colonoscopy with propofol.  09/28/2020 7:50 AM  Marcus [name]  has presented today for colonoscopy, with the diagnosis of colonic mass on CT.  The various methods of treatment have been discussed with the patient and family. After consideration of risks, benefits and other options for treatment, the patient has consented to  Procedure(s): COLONOSCOPY (N/A) as a surgical intervention.  The patient's history has been reviewed, patient examined, no change in status, stable for surgery.  I have reviewed the patient's chart and labs.  Questions were answered to the patient's satisfaction.     Ronnette Juniper

## 2020-09-28 NOTE — Progress Notes (Signed)
Occupational Therapy Treatment Patient Details Name: Marcus [name] MRN: [id number] DOB: Jan 22, 1945 Today's Date: 09/28/2020   History of present illness 75 yo male admitted with GI bleed, anemia. Hx of prostate ca, R THA   OT comments  Treatment focused on functional mobility, clarifying ADL abilities and AE at home, and reiterating spinal precautions. Patient able to perform bed mobility without breaking spinal precautions. Patient reports toileting independently but unsure if he is doing so safely without bending. He was not initially able to state spinal precautions and therapist reiterated multiple times while perform functional mobility. Needs mod assistance to don brace - reports his roommate assists him. In discussion in regards to daily tasks patient reports just coming back from bathroom without assistance (performing toileting independently) and able to get pajama pants on. He reports he was given a sock aide and a reacher but also reports he doesn't know where it is - suggesting he didn't use them at home once he was discharged and also suggesting he wasn't following his spinal precautions. Will need reiteration of spinal precautions for a safe return home. Recommend 24/7 supervision at discharge.   Recommendations for follow up therapy are one component of a multi-disciplinary discharge planning process, led by the attending physician.  Recommendations may be updated based on patient status, additional functional criteria and insurance authorization.    Follow Up Recommendations  Home health OT;Supervision/Assistance - 24 hour    Equipment Recommendations  None recommended by OT    Recommendations for Other Services      Precautions / Restrictions Precautions Precautions: Fall;Back;Other (comment) (Can walk to BR without brace, apply brace in sitting, can remove for shower.) Precaution Comments: On prior admission Required Braces or Orthoses: Spinal Brace Spinal Brace:  Thoracolumbosacral orthotic;Applied in sitting position Restrictions Weight Bearing Restrictions: No       Mobility Bed Mobility Overal bed mobility: Needs Assistance Bed Mobility: Supine to Sit;Sit to Supine     Supine to sit: Supervision Sit to supine: Supervision   General bed mobility comments: supervision for bed mobility - able to transfer without twisting back.    Transfers   Equipment used: Rolling walker (2 wheeled) Transfers: Sit to/from [employer] transfer comment: Min guard to stand - verbal cue to push up with hand in order to maintain spinal precautions. Min guard to ambualte approx 100 feet in hallway.    Balance Overall balance assessment: Mild deficits observed, not formally tested                                         ADL either performed or assessed with clinical judgement   ADL Overall ADL's : Needs assistance/impaired                                       General ADL Comments: Needs mod assistance to don brace - reports his roommate assists him. In discussion in regards to daily tasks patinet reports just coming back from bathroom without assistance (performing toileting independently) and able to get pajama pants on. He reports he was given a sock aide and a reacher but also reports he doesn't know where it is - suggesting he didn't use them at home once he was discharged and also suggesting he  wasn't following his spinal precautions.     Vision   Vision Assessment?: No apparent visual deficits   Perception     Praxis      Cognition Arousal/Alertness: Awake/alert Behavior During Therapy: WFL for tasks assessed/performed Overall Cognitive Status: Within Functional Limits for tasks assessed                                 General Comments: Patient unable to state spinal precautions. Therapist reiterated spinal preacutions three times during treatment and wrote on board.         Exercises     Shoulder Instructions       General Comments      Pertinent Vitals/ Pain       Pain Assessment: Faces Faces Pain Scale: Hurts little more Pain Location: back Pain Descriptors / Indicators: Discomfort;Grimacing;Sore Pain Intervention(s): Monitored during session  Home Living                                          Prior Functioning/Environment              Frequency  Min 2X/week        Progress Toward Goals  OT Goals(current goals can now be found in the care plan section)  Progress towards OT goals: Progressing toward goals  Acute Rehab OT Goals Patient Stated Goal: return home and safely perform ADLs and mobility OT Goal Formulation: With patient Time For Goal Achievement: 10/10/20 Potential to Achieve Goals: Good  Plan Discharge plan remains appropriate;Frequency remains appropriate    Co-evaluation                 AM-PAC OT "6 Clicks" Daily Activity     Outcome Measure   Help from another person eating meals?: None Help from another person taking care of personal grooming?: A Little Help from another person toileting, which includes using toliet, bedpan, or urinal?: A Little Help from another person bathing (including washing, rinsing, drying)?: A Little Help from another person to put on and taking off regular upper body clothing?: A Little Help from another person to put on and taking off regular lower body clothing?: A Lot (without AE) 6 Click Score: 18    End of Session Equipment Utilized During Treatment: Back brace;Rolling walker  OT Visit Diagnosis: Unsteadiness on feet (R26.81);Other abnormalities of gait and mobility (R26.89);Muscle weakness (generalized) (M62.81);Pain   Activity Tolerance Patient tolerated treatment well   Patient Left in bed;with call bell/phone within reach   Nurse Communication Mobility status        Time: 1446-1510 OT Time Calculation (min): 24 min  Charges: OT General  Charges $OT Visit: 1 Visit OT Treatments $Therapeutic Activity: 8-22 mins  [name], OTR/L [place]  Office [phone] Pager: [place] 09/28/2020, 3:29 PM

## 2020-09-28 NOTE — Anesthesia Procedure Notes (Signed)
Procedure Name: MAC Date/Time: 09/28/2020 8:11 AM Performed by: Cynda Familia, CRNA Pre-anesthesia Checklist: Patient identified, Emergency Drugs available, Suction available, Patient being monitored and Timeout performed Patient Re-evaluated:Patient Re-evaluated prior to induction Oxygen Delivery Method: Simple face mask Placement Confirmation: positive ETCO2 and breath sounds checked- equal and bilateral Dental Injury: Teeth and Oropharynx as per pre-operative assessment

## 2020-09-28 NOTE — Progress Notes (Signed)
Progress Note    [name]  [id number] DOB: 1945/12/26  DOA: 09/25/2020 PCP: [name], MD    Brief Narrative:    Medical records reviewed and are as summarized below:  Marcus [name] is an 75 y.o. 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 Arnoldo Morale on 9/4 and anemia returning with upper back pain, decreased appetite and black stool.  EGD 9/12- mass biopsied.  CT scan of abdomen also shows possible mass.  Oncology consult.    Assessment/Plan:   Active Problems:   Prostate cancer (HCC)   Hyponatremia   GI bleed   Esophageal cancer (HCC)   Esophageal mass   Colonic mass   Goals of care, counseling/discussion   Symptomatic microcytic anemia 2/2 GI bleed, Iron def and Vit B12 def -EGD 9/12- mass biopsied: Poorly differentiated carcinoma -CT scan of abdomen also shows mass- colonoscopy today found sigmoid obstructing mass --Hgb 8's -s/p IM Vit B12 x 3 days Plan: --Monitor Hgb and transfuse PRN --start IV iron x 3 days  Sigmoid obstructing mass --No pain and still having BM's. --GenSurg consult today --cont clear liquid diet  Postsurgical back pain -from recent thoracic spine surgery.  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 Arnoldo Morale on 9/4.  Surgical wound appears clean.  No focal neuro symptoms or bowel or bladder habit change. --oxycodone PRN -Outpatient follow-up with neurosurgery -PT/OT eval- home health   History of prostate cancer with osseous mets s/p radical prostatectomy and radiation in 2015.   -Outpatient follow-up   Pyuria/bacteriuria -patient denies UTI symptoms, fever, chills, suprapubic pain or lower back pain. -No indication for treatment.  Low B12 -Vit B12 140 -s/p IM Vit  B12 x 3 days  Hypokalemia --monitor and replete with oral potassium  Hyponatremia, improved --monitor with daily labs  Vitamin D def -will need replacement after procedures   DVT prophylaxis: scd Code Status: Full Code.  Disposition Plan: Status is: inpt   Dispo: The patient is from: Home              Anticipated d/c is to: Home              Patient currently is not medically stable to d/c.  Sigmoid obstructing mass, pending GenSurg consult    Difficult to place patient No    Medical Consultants:   GI oncology  Subjective:   Pt denied pain.  Still having BM's.    GenSurg consulted today for sigmoid obstructing mass.   Objective:    Vitals:   09/28/20 1000 09/28/20 1109 09/28/20 1201 09/28/20 1300  BP: (!) 168/69 (!) 154/65 (!) 156/67 (!) 158/69  Pulse: 70 66 67 69  Resp: '20 19 20 20  '$ Temp:  99.1 F (37.3 C) 99 F (37.2 C) 98.7 F (37.1 C)  TempSrc:  Oral Oral Oral  SpO2: 98% 100% 100% 100%  Weight:      Height:        Intake/Output Summary (Last 24 hours) at 09/28/2020 1820 Last data filed at 09/28/2020 1700 Gross per 24 hour  Intake 1794.6 ml  Output 0 ml  Net 1794.6 ml   Filed Weights   09/25/20 0617 09/26/20 1245 09/28/20 0800  Weight: 70.7 kg 74.8 kg 74.8 kg    Exam:  Constitutional: NAD, AAOx3 HEENT: conjunctivae and lids normal, EOMI CV: No cyanosis.   RESP: normal respiratory effort, on RA GI: abdomen soft, NTND Extremities: No effusions, edema in BLE SKIN: warm, dry Neuro: II - XII grossly intact.   Psych: Normal mood and affect.  Appropriate judgement and reason    Data Reviewed:   I have personally reviewed following labs and imaging studies:  Labs: Labs show the following:   Basic Metabolic Panel: Recent Labs  Lab 09/25/20 1304 09/26/20 0641 09/27/20 0414 09/28/20 0441  NA 138 131* 128* 132*  K 3.6 3.2* 3.5 3.2*  CL 107 101 98 102  CO2 21* 22 21* 22  GLUCOSE 91 129* 107* 122*  BUN 16 9 7* 6*  CREATININE 0.96  0.90 0.78 0.98  CALCIUM 8.5* 7.8* 8.0* 8.0*   GFR Estimated Creatinine Clearance: 70.4 mL/min (by C-G formula based on SCr of 0.98 mg/dL). Liver Function Tests: Recent Labs  Lab 09/25/20 1304  AST 20  ALT 26  ALKPHOS 67  BILITOT 0.9  PROT 6.3*  ALBUMIN 3.1*   No results for input(s): LIPASE, AMYLASE in the last 168 hours. No results for input(s): AMMONIA in the last 168 hours. Coagulation profile Recent Labs  Lab 09/25/20 1554  INR 1.1    CBC: Recent Labs  Lab 09/25/20 1304 09/25/20 1554 09/25/20 2253 09/26/20 0641 09/27/20 0414  WBC 8.2  --   --  7.2 6.7  NEUTROABS 6.4  --   --   --   --   HGB 9.9* 9.5* 8.5* 8.2* 8.4*  HCT 31.4* 29.6* 25.7* 25.1* 25.5*  MCV 80.5  --   --  76.5* 76.6*  PLT 187  --   --  199 221   Cardiac Enzymes: No results for input(s): CKTOTAL, CKMB, CKMBINDEX, TROPONINI in the last 168 hours. BNP (last 3 results) No results for input(s): PROBNP in the last 8760 hours. CBG: No results for input(s): GLUCAP in the last 168 hours. D-Dimer: No results for input(s): DDIMER in the last 72 hours. Hgb A1c: No results for input(s): HGBA1C in the last 72 hours. Lipid Profile: No results for input(s): CHOL, HDL, LDLCALC, TRIG, CHOLHDL, LDLDIRECT in the last 72 hours. Thyroid function studies: No results for input(s): TSH, T4TOTAL, T3FREE, THYROIDAB in the last 72 hours.  Invalid input(s): FREET3 Anemia work up: No results for input(s): VITAMINB12, FOLATE, FERRITIN, TIBC, IRON, RETICCTPCT in the last 72 hours.  Sepsis Labs: Recent Labs  Lab 09/25/20 1304 09/26/20 0641 09/27/20 0414  WBC 8.2 7.2 6.7    Microbiology No results found for this or any previous visit (from the past 240 hour(s)).   Procedures and diagnostic studies:  No results found.  Medications:    feeding supplement  1 Container Oral TID BM   pantoprazole (PROTONIX) IV  40 mg Intravenous Q12H   Continuous Infusions:  sodium chloride     sodium chloride 50 mL/hr at  09/28/20 1700   lactated ringers 1,000 mL (09/28/20 0804)     LOS: 1 day   Enzo Bi  [facility]   09/28/2020, 6:20 PM

## 2020-09-29 ENCOUNTER — Encounter (HOSPITAL_COMMUNITY): Payer: Self-pay | Admitting: Gastroenterology

## 2020-09-29 ENCOUNTER — Ambulatory Visit: Payer: Medicare Other | Admitting: Hematology

## 2020-09-29 DIAGNOSIS — K6389 Other specified diseases of intestine: Secondary | ICD-10-CM | POA: Diagnosis not present

## 2020-09-29 DIAGNOSIS — C61 Malignant neoplasm of prostate: Secondary | ICD-10-CM | POA: Diagnosis not present

## 2020-09-29 DIAGNOSIS — Z7189 Other specified counseling: Secondary | ICD-10-CM | POA: Diagnosis not present

## 2020-09-29 DIAGNOSIS — C159 Malignant neoplasm of esophagus, unspecified: Secondary | ICD-10-CM

## 2020-09-29 LAB — CBC
HCT: 24.5 % — ABNORMAL LOW (ref 39.0–52.0)
Hemoglobin: 8.1 g/dL — ABNORMAL LOW (ref 13.0–17.0)
MCH: 25.2 pg — ABNORMAL LOW (ref 26.0–34.0)
MCHC: 33.1 g/dL (ref 30.0–36.0)
MCV: 76.1 fL — ABNORMAL LOW (ref 80.0–100.0)
Platelets: 224 10*3/uL (ref 150–400)
RBC: 3.22 MIL/uL — ABNORMAL LOW (ref 4.22–5.81)
RDW: 17.5 % — ABNORMAL HIGH (ref 11.5–15.5)
WBC: 7.3 10*3/uL (ref 4.0–10.5)
nRBC: 0 % (ref 0.0–0.2)

## 2020-09-29 LAB — BASIC METABOLIC PANEL
Anion gap: 9 (ref 5–15)
BUN: <5 mg/dL — ABNORMAL LOW (ref 8–23)
CO2: 20 mmol/L — ABNORMAL LOW (ref 22–32)
Calcium: 8 mg/dL — ABNORMAL LOW (ref 8.9–10.3)
Chloride: 101 mmol/L (ref 98–111)
Creatinine, Ser: 0.96 mg/dL (ref 0.61–1.24)
GFR, Estimated: >60 mL/min (ref >60)
Glucose, Bld: 112 mg/dL — ABNORMAL HIGH (ref 70–99)
Potassium: 3.4 mmol/L — ABNORMAL LOW (ref 3.5–5.1)
Sodium: 130 mmol/L — ABNORMAL LOW (ref 135–145)

## 2020-09-29 LAB — MAGNESIUM: Magnesium: 1.9 mg/dL (ref 1.7–2.4)

## 2020-09-29 LAB — SURGICAL PATHOLOGY

## 2020-09-29 LAB — CEA: CEA: 2.9 ng/mL (ref 0.0–4.7)

## 2020-09-29 MED ORDER — ENOXAPARIN SODIUM 40 MG/0.4ML IJ SOSY
40.0000 mg | PREFILLED_SYRINGE | INTRAMUSCULAR | Status: DC
  Administered 2020-09-29 – 2020-09-30 (×2): 40 mg via SUBCUTANEOUS
  Filled 2020-09-29 (×2): qty 0.4

## 2020-09-29 MED ORDER — SODIUM CHLORIDE 0.9 % IV SOLN
200.0000 mg | Freq: Every day | INTRAVENOUS | Status: AC
  Administered 2020-09-29 – 2020-10-01 (×2): 200 mg via INTRAVENOUS
  Filled 2020-09-29 (×3): qty 10

## 2020-09-29 NOTE — Progress Notes (Addendum)
HEMATOLOGY-ONCOLOGY PROGRESS NOTE  SUBJECTIVE: Marcus. Marcus [name] denies abdominal pain, nausea, vomiting.  He reports liquid stools.  He is on a clear liquid diet.  Oncology History Overview Note  Called the available numbers this morning, his brother in law Marcus [name] answered.  Advised him to please talk to the patient ASAP because we are unable to get hold of him. I mentioned this is regarding the abnormal MRI and he needs to come to the ER for further treatment. Marcus [name] expressed understanding and he will try to get hold of Marcus [name] ASAP. The urgency of the matter was clearly conveyed. Marcus [name] apparently talked to his room mate and conveyed the message to his room mate that Marcus [name] need to get in touch back with him urgently and he reassured me he will relay the message to Marcus [name] ASAP      REVIEW OF SYSTEMS:   A 10 point review of systems is negative except as noted in the HPI.  I have reviewed the past medical history, past surgical history, social history and family history with the patient and they are unchanged from previous note.   PHYSICAL EXAMINATION: ECOG PERFORMANCE STATUS: 1 - Symptomatic but completely ambulatory  Vitals:   09/28/20 2227 09/29/20 0436  BP: (!) 145/77 (!) 141/72  Pulse: 77 74  Resp: 17 17  Temp: 98.3 F (36.8 C) 98.5 F (36.9 C)  SpO2: 98% 100%   Filed Weights   09/25/20 0617 09/26/20 1245 09/28/20 0800  Weight: 70.7 kg 74.8 kg 74.8 kg    Intake/Output from previous day: 09/14 0701 - 09/15 0700 In: 1794.6 [P.O.:360; I.V.:1434.6] Out: 0   GENERAL:alert, no distress and comfortable SKIN: skin color, texture, turgor are normal, no rashes or significant lesions EYES: normal, Conjunctiva are pink and non-injected, sclera clear OROPHARYNX:no exudate, no erythema and lips, buccal mucosa, and tongue normal  LUNGS: clear to auscultation and percussion with normal breathing effort HEART: regular rate & rhythm and no murmurs and no [name]  extremity edema ABDOMEN:abdomen soft, non-tender and normal bowel sounds NEURO: alert & oriented x 3 with fluent speech, no focal motor/sensory deficits  LABORATORY DATA:  I have reviewed the data as listed CMP Latest Ref Rng & Units 09/29/2020 09/28/2020 09/27/2020  Glucose 70 - 99 mg/dL 112(H) 122(H) 107(H)  BUN 8 - 23 mg/dL <5(L) 6(L) 7(L)  Creatinine 0.61 - 1.24 mg/dL 0.96 0.98 0.78  Sodium 135 - 145 mmol/L 130(L) 132(L) 128(L)  Potassium 3.5 - 5.1 mmol/L 3.4(L) 3.2(L) 3.5  Chloride 98 - 111 mmol/L 101 102 98  CO2 22 - 32 mmol/L 20(L) 22 21(L)  Calcium 8.9 - 10.3 mg/dL 8.0(L) 8.0(L) 8.0(L)  Total Protein 6.5 - 8.1 g/dL - - -  Total Bilirubin 0.3 - 1.2 mg/dL - - -  Alkaline Phos 38 - 126 U/L - - -  AST 15 - 41 U/L - - -  ALT 0 - 44 U/L - - -    Lab Results  Component Value Date   WBC 7.3 09/29/2020   HGB 8.1 (L) 09/29/2020   HCT 24.5 (L) 09/29/2020   MCV 76.1 (L) 09/29/2020   PLT 224 09/29/2020   NEUTROABS 6.4 09/25/2020    DG Ribs Unilateral Right  Result Date: 08/31/2020 CLINICAL DATA:  [name] rib pain, back pain EXAM: RIGHT RIBS - 2 VIEW COMPARISON:  07/23/2013 FINDINGS: No fracture or other bone lesions are seen involving the ribs. IMPRESSION: Negative. Electronically Signed   By: Rolm Baptise  M.D.   On: 08/31/2020 03:53   DG Ribs Unilateral W/Chest Left  Result Date: 08/31/2020 CLINICAL DATA:  [name] rib pain, back pain EXAM: LEFT RIBS AND CHEST - 3+ VIEW COMPARISON:  07/23/2013 FINDINGS: No fracture or other bone lesions are seen involving the ribs. There is no evidence of pneumothorax or pleural effusion. Both lungs are clear. Heart size and mediastinal contours are within normal limits. IMPRESSION: Negative. Electronically Signed   By: Rolm Baptise M.D.   On: 08/31/2020 03:53   DG Thoracic Spine 2 View  Result Date: 08/31/2020 CLINICAL DATA:  [name] rib pain, back pain EXAM: THORACIC SPINE 2 VIEWS COMPARISON:  None. FINDINGS: There is no evidence of thoracic spine  fracture. Alignment is normal. No other significant bone abnormalities are identified. IMPRESSION: Negative. Electronically Signed   By: Rolm Baptise M.D.   On: 08/31/2020 03:52   DG Lumbar Spine 2-3 Views  Result Date: 09/18/2020 CLINICAL DATA:  Elective surgery. EXAM: LUMBAR SPINE - 2-3 VIEW COMPARISON:  Prior thoracic spine imaging. FINDINGS: Two lateral coned spot views of the spine obtained in the operating room. Pedicle screws are seen at 2 contiguous levels as well as the more superior level. Levels are not well delineated on these coned operative views. Fluoroscopy time 1 minute 6 seconds. IMPRESSION: Procedural fluoroscopy for spine surgery, recommend correlation with operative report for details. Electronically Signed   By: Keith Rake M.D.   On: 09/18/2020 18:36   CT Chest Wo Contrast  Result Date: 08/31/2020 CLINICAL DATA:  Chest pain or shortness of breath with pleurisy or effusion suspected. Bilateral chest wall pain and midthoracic pain for 1 month EXAM: CT CHEST WITHOUT CONTRAST TECHNIQUE: Multidetector CT imaging of the chest was performed following the standard protocol without IV contrast. COMPARISON:  None similar FINDINGS: Cardiovascular: Normal heart size. Small volume pericardial fluid. Scattered atheromatous plaque with a low-density plaque at the isthmus. Mediastinum/Nodes: Negative for adenopathy Lungs/Pleura: Lungs are clear. No pleural effusion or pneumothorax. Upper Abdomen: Low-density in the subcapsular right liver favoring cyst which was seen in this location to a smaller degree in 2015. There are multiple enlarged nodes at the porta hepatis/gastrohepatic ligament with nodes measuring up to 2.5 cm in length. Musculoskeletal: Reference dedicated thoracic spine CT concerning sclerotic bone lesion at T5 and T6 with canal and foraminal involvement. No acute fracture. IMPRESSION: 1. Sclerotic bone lesion at T5 and T6 with canal and foraminal infiltration, reference dedicated  thoracic spine reformats. 2. Upper abdominal adenopathy. 3. 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 Electronically Signed   By: Monte Fantasia M.D.   On: 08/31/2020 08:49   Marcus THORACIC SPINE W WO CONTRAST  Result Date: 09/16/2020 CLINICAL DATA:  Cord compression. 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 Evaluate for bone metastases. EXAM: MRI THORACIC WITHOUT AND WITH CONTRAST TECHNIQUE: Multiplanar and multiecho pulse sequences of the thoracic spine were obtained without and with intravenous contrast. CONTRAST:  7.70m GADAVIST GADOBUTROL 1 MMOL/ML IV SOLN COMPARISON:  CT of the thoracic spine 08/31/2020. FINDINGS: Alignment:  No significant spondylolisthesis. Vertebrae: Osseous lesions within the posterior T5 vertebral body and T5 posterior elements, as well as T6 vertebral body and posterior elements. Involvement of the T6 vertebral body and posterior elements is extensive. In addition to involvement of the right T6 transverse process, there is also involvement of the posterior right sixth rib. There is also involvement of the bilateral T7 pedicles/articular pillars, T7 spinous process, right T7 transverse process, and subtle involvement  of the posterior T7 vertebral body. There is subtle heterogeneous enhancement of the T8 spinous process, which could reflect an additional lesion (series 23, image 8). Aditionally, there is partially ossified enhancing epidural tumor within the right posterolateral aspect of the spinal canal at the T5-T6 level. Resultant severe spinal canal stenosis with spinal cord compression. There may be subtle T2 hyperintense signal abnormality within the spinal cord at this level compatible with myelomalacia and/or focal edema (series 17, image 10). Additionally, tumor encroaches upon the right T5-T6 and T6-T7 neural foramina. Cord: Significant spinal cord compression at T5-T6 by tumor, as described above. Possible  subtle T2/STIR hyperintense signal abnormality within the spinal cord at this level, which may reflect focal edema and/or myelomalacia (series 17, image 10). Paraspinal and other soft tissues: No abnormality identified within included portions of the thorax or upper abdomen/retroperitoneum. Disc levels: No more than mild disc degeneration within the thoracic spine. No significant focal disc herniation or degenerative spinal canal stenosis. Mild facet arthrosis within the [name] thoracic spine. No degenerative compressive foraminal stenosis. Impressions 1 and 2 will be called to the ordering clinician or representative by the Radiologist Assistant, and communication documented in the PACS or [employer]. IMPRESSION: Osseous lesions within the T5, T6, T7 and possibly T8 vertebra, as well as right 6th rib as detailed. These findings are highly suspicious for osseous metastatic disease given the patient's history of prostate cancer. 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 Subtle signal abnormality is questioned within the spinal cord at this level, and this may reflect focal edema and/or myelomalacia. Tumor also encroaches upon the right T5-T6 and T6-T7 neural foramina. Electronically Signed   By: Kellie Simmering D.O.   On: 09/16/2020 20:04   CT CHEST ABDOMEN PELVIS W CONTRAST  Result Date: 09/26/2020 CLINICAL DATA:  Esophageal mass, history of prostate cancer, thoracic bony metastases, epidural thoracic mass on previous MRI EXAM: CT CHEST, ABDOMEN, AND PELVIS WITH CONTRAST TECHNIQUE: Multidetector CT imaging of the chest, abdomen and pelvis was performed following the standard protocol during bolus administration of intravenous contrast. CONTRAST:  22m OMNIPAQUE IOHEXOL 350 MG/ML SOLN COMPARISON:  09/16/2020, 08/31/2020 FINDINGS: CT CHEST FINDINGS  Cardiovascular: Trace pericardial effusion. The heart is not enlarged. No evidence of thoracic aortic aneurysm or dissection. Stable atherosclerosis. Mediastinum/Nodes: Irregular mural thickening within the midthoracic esophagus at the level of the carina likely corresponds to the reported history of esophageal mass, and is suspicious for neoplasm. No pathologic adenopathy within the mediastinum, hila, or axilla. Thyroid and trachea are unremarkable. Lungs/Pleura: Hypoventilatory changes are seen at the lung bases, with trace bilateral pleural effusions. No acute airspace disease or pneumothorax. No pulmonary nodules. Musculoskeletal: Postsurgical changes are seen from interval laminectomy and posterior fusion spanning T4 through T8. Sclerotic lesions within the T5 and T6 vertebral bodies as well as the right posterior sixth rib are unchanged. There are no acute displaced fractures. Reconstructed images demonstrate no additional findings. CT ABDOMEN PELVIS FINDINGS Hepatobiliary: Within the right lobe liver image 54/2 there is a 2.9 x 2.0 cm hypodensity which has developed since prior study, highly concerning for metastatic disease. Numerous other punctate less than 3 mm hypodensities within the remainder of the liver are indeterminate. No intrahepatic duct dilation. Small calcified gallstone without evidence of acute cholecystitis. Pancreas: Unremarkable. No pancreatic ductal dilatation or surrounding inflammatory changes. Spleen: Normal in size without focal abnormality.  Adrenals/Urinary Tract: Chronic thickening of the lateral limb of the left adrenal gland measuring up to 11 mm, unchanged since 2015 and consistent with adenoma or hyperplasia. The right adrenals unremarkable. Stable small left renal cyst. No urinary tract calculi or obstructive uropathy. The bladder is unremarkable. Stomach/Bowel: There is an apple-core type lesion involving the distal sigmoid colon, with irregular circumferential wall  thickening. This mass extends approximately 4.3 cm in length, and measures approximately 5.0 x 4.9 cm in cross-sectional diameter. This is highly concerning for colonic neoplasm. There is moderate retained stool within the more proximal colon, likely due to partial colonic obstruction. No evidence of small-bowel obstruction. Vascular/Lymphatic: Epiphrenic lymphadenopathy is identified, with lymph node mass anterior to the right crus of the diaphragm measuring up to 3.7 x 2.3 cm reference image 56/2. Lymphadenopathy within the gastrohepatic ligament measures up to 1.3 cm in short axis reference image 55/2. Retroperitoneal lymphadenopathy in the left para-aortic region measures up to 1.2 cm in short axis reference image 67/2. Numerous other enlarged retroperitoneal lymph nodes are seen within the aortocaval region as well. Findings are consistent with nodal metastases. There is diffuse atherosclerosis of the aorta. There is high-grade greater than 90% stenosis within the left common iliac artery. Moderate stenosis is seen within the right common iliac artery estimated 50%. Reproductive: Prostate is surgically absent. Other: Trace free fluid within the pelvis. No free intraperitoneal gas. No abdominal wall hernia. Musculoskeletal: Unremarkable right hip arthroplasty. 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 No acute or destructive bony lesions. Reconstructed images demonstrate no additional findings. IMPRESSION: 1. Irregular circumferential wall thickening within the midthoracic esophagus consistent with the esophageal mass described in history. Findings are highly suspicious for neoplasm. 2. Circumferential apple-core type mass involving the distal sigmoid colon compatible with colonic malignancy. Colonoscopy recommended if not previously performed. There is an element of partial colonic obstruction with significant proximal colonic dilation and fecal retention. 3. Extensive lymphadenopathy  within the abdomen and pelvis as above, largest lymph node mass in the upper phrenic region, consistent with nodal metastases. 4. Hypodensities within the liver, largest in the right lobe suspicious for metastatic disease. 5. Trace bilateral pleural effusions. 6. Postsurgical changes from recent thoracic fusion and laminectomy. 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 Please refer to previous MRI discussion. 7. 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 8. 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 Electronically Signed   By: Randa Ngo M.D.   On: 09/26/2020 17:56   CT T-SPINE NO CHARGE  Result Date: 08/31/2020 CLINICAL DATA:  Back pain for a month EXAM: CT THORACIC SPINE WITHOUT CONTRAST TECHNIQUE: Multidetector CT images of the thoracic were obtained using the standard protocol without intravenous contrast. COMPARISON:  No similar CT comparison FINDINGS: Alignment: Normal Vertebrae: Sclerosis in the vertebral bodies of T5, T7, especially T6. At T6 there is extension in to right-sided pedicle and posterior elements with extraosseous partially ossified tissue effacing the thecal sac from posterolateral and involving the right T5-6 and T6-7 foramina. Negative for fracture deformity. Paraspinal and other soft tissues: Reported separately Disc levels: Less than typical degenerative changes in the spine. IMPRESSION: Sclerotic bone lesion at T5-T7, greatest at T6 where there is posterior element involvement with right-sided epidural tumor and right T5-6, T6-7 foraminal infiltration. Cord deformation/compression is present at T6. There is  history of prostate cancer in the past. Electronically Signed   By: Monte Fantasia M.D.   On: 08/31/2020 08:53   DG C-Arm 1-60 Min-No Report  Result Date:  09/18/2020 Fluoroscopy was utilized by the requesting physician.  No radiographic interpretation.    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 Tresa Moore.  Had extracapsular extension and positive resection margins. -Status post postoperative radiation therapy by Dr. Tammi Klippel. -Lost to follow-up after treatment.   2) T5-T7 sclerotic bone lesion involving posterior elements with right-sided epidural tumor and T5-6 T6-7 foraminal infiltration. 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  The tumor is negative with CDX2, cytokeratin 7, cytokeratin 20, MOC31, prostein and prostate-specific antigen.  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   4) apple core type mass of the distal sigmoid colon noted on CT scan from 09/26/2020 -CEA 2.9 on 09/28/2020 -Colonoscopy performed 09/28/2020 showed that mass is obstructing the lumen. -Biopsy from 09/28/2020 showed a high-grade dysplasia arising in a tubular adenoma-biopsy may not be representative of the mass seen endoscopically  5) extensive lymphadenopathy in the abdomen and hypodensities in the liver concerning for metastatic disease  6) microcytic anemia secondary to GI bleed, B12 deficiency -Labs from 09/25/2020-ferritin 374, iron 14, TIBC 235, percent saturation 6%, folate 7.5, vitamin B12 140 -Iron sucrose 200 mg IV x3 doses started on 09/29/2020   7) active smoker half  a pack per day 8) poor medical follow-up 9) family history of GI malignancy -Reported sister with colon malignancy  PLAN: -Discussed biopsy from laminectomy with the patient.  Biopsy was consistent with current, metastatic prostate cancer.  Will need follow-up with radiation oncology.  We will also consider him for Lupron therapy as an outpatient. -He has a new diagnosis of esophageal cancer.  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  May consider a course of radiation +/- systemic chemotherapy to his esophageal mass.  I I agree that I do not think he is a candidate for esophagectomy or resection of the esophageal mass. -The patient was noted to have a colon mass noted on CT scan.  Colonoscopy was performed on 9/14 which showed that the mass was completely obstructing the lumen of the colon.  Biopsy was not consistent with colon cancer, but mass is still highly suspicious for colon malignancy.  CEA normal. He is having liquid stools but remains at high risk for complete obstruction.  General surgery is following.  I am not certain that systemic chemotherapy would work rapidly enough to shrink this mass before he has problems from it.  May be reasonable to consider diverting colostomy prior to consideration of chemotherapy as it will be difficult to consider surgery later on if he develops symptoms while he is receiving chemotherapy.  Further recommendations later today per Dr. Irene Limbo. -Reasonable to engage palliative care given that the patient has 2 confirmed metastatic (and likely third) malignancy for goals of care discussion. -The patient reports that he had a sister who died from a GI  malignancy.  May consider him for genetic counseling as an outpatient. -Monitor hemoglobin closely.  Transfuse for hemoglobin less than 7.5 -Vitamin B12 is being replaced.   LOS: 2 days   Marcus Bussing, DNP, AGPCNP-BC, AOCNP 09/29/20   ADDENDUM  .Patient was Personally and  independently interviewed, examined and relevant elements of the history of present illness were reviewed in details and an assessment and plan was created. All elements of the patient's history of present illness , assessment and plan were discussed in details with Marcus Bussing, DNP, AGPCNP-BC, AOCNP. The above documentation reflects our combined findings assessment and plan.   Patient is in a challenging situation with 3 primary malignancies.  Immediate concern in talking with Marcus [name] from the surgery team is that he has near complete impending obstruction from his colonic mass.  The patient is inclined to pursue treatments and is not keen on directly going to hospice at this time. He does not seem to have an option but to address his obstructive colonic mass at this time. Surgery is planning to do either an and colostomy or diverting loop colostomy on 09/30/2020.  Discussed with Marcus [name] and they will be considering placing the G-tube.  Patient will likely need a Port-A-Cath but this could be done at a later point to reduce the risk of seeding of the port with his colonic surgery.  After surgical recovery and with ongoing goals of care will need to optimize his nutrition through the G-tube after surgery. Will need to consult radiation oncology for his poorly differentiated esophageal carcinoma.  Might need to consider palliative radiation plus possible chemotherapy likely with a 5-FU based regimen to address both esophageal and colonic primaries. His metastatic prostate cancer will be addressed with only Lupron at this time which we shall start after surgery. Patient's CEA level is within normal limits however imaging studies with liver metastases is concerning for metastatic colon cancer though metastases from prostate cancer cannot be ruled out. If needed for treatment planning might need to consider biopsy of the liver lesion at a later time point.  Appreciate excellent help from the hospital  medicine and surgical teams. We shall continue to follow.   Sullivan Lone MD MS

## 2020-09-29 NOTE — Progress Notes (Signed)
[facility] Progress Note  Marcus [name] 75 y.o. [birth year]  CC:  Melena and Anemia  Subjective: Patient denies dysphagia, globulus sensation, odynophagia.  He remains on clear liquids. Patient's last BM was yesterday, having small volume liquids stools, increasing passing of gas.  Denies nausea, vomiting, AB pain.  Patient denies fever, chills.    Review of Systems  Constitutional:  Positive for malaise/fatigue and weight loss. Negative for chills and fever.  HENT:  Negative for sore throat.   Respiratory:  Negative for shortness of breath.   Cardiovascular:  Negative for chest pain and leg swelling.  Gastrointestinal:  Positive for blood in stool, diarrhea (small volume liquid stools) and melena. Negative for abdominal pain, constipation, heartburn, nausea and vomiting.  Musculoskeletal:  Negative for falls.  Skin:  Negative for itching.  Neurological:  Positive for dizziness. Negative for focal weakness.  Psychiatric/Behavioral:  Positive for depression.      Objective: Vital signs in last 24 hours: Vitals:   09/28/20 2227 09/29/20 0436  BP: (!) 145/77 (!) 141/72  Pulse: 77 74  Resp: 17 17  Temp: 98.3 F (36.8 C) 98.5 F (36.9 C)  SpO2: 98% 100%    Physical Exam Constitutional:      General: He is awake. He is not in acute distress.    Appearance: He is underweight.  HENT:     Mouth/Throat:     Comments: Poor dentition Eyes:     General: No scleral icterus. Abdominal:     General: There is no distension.     Palpations: Abdomen is soft.     Tenderness: There is no abdominal tenderness. There is no guarding or rebound.     Comments: Mild abdominal distention, increased bowel sounds  Musculoskeletal:     Comments: Walks with walker, slow to transition in bed  Psychiatric:        Behavior: Behavior is cooperative.     Lab Results: Recent Labs    09/28/20 0441 09/29/20 0505  NA 132* 130*  K 3.2* 3.4*  CL 102 101  CO2 22 20*  GLUCOSE 122*  112*  BUN 6* <5*  CREATININE 0.98 0.96  CALCIUM 8.0* 8.0*  MG  --  1.9    No results for input(s): AST, ALT, ALKPHOS, BILITOT, PROT, ALBUMIN in the last 72 hours.  Recent Labs    09/27/20 0414 09/29/20 0505  WBC 6.7 7.3  HGB 8.4* 8.1*  HCT 25.5* 24.5*  MCV 76.6* 76.1*  PLT 221 224    No results for input(s): LABPROT, INR in the last 72 hours.  CT chest/AB/Pelvis 09/26/2020 IMPRESSION: 1. Irregular circumferential wall thickening within the midthoracic esophagus consistent with the esophageal mass described in history. Findings are highly suspicious for neoplasm. 2. Circumferential apple-core type mass involving the distal sigmoid colon compatible with colonic malignancy. Colonoscopy recommended if not previously performed. There is an element of partial colonic obstruction with significant proximal colonic dilation and fecal retention. 3. Extensive lymphadenopathy within the abdomen and pelvis as above, largest lymph node mass in the upper phrenic region, consistent with nodal metastases. 4. Hypodensities within the liver, largest in the right lobe suspicious for metastatic disease. 5. Trace bilateral pleural effusions. 6. Postsurgical changes from recent thoracic fusion and laminectomy. 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 Please refer to previous MRI discussion. 7. 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 8. 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  EGD with Biopsy B. 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  Patient being followed by oncology, getting PET scan Continue Protonix 40 mg IV BID. May want to consider genetic testing  Sigmoid colonic mass s/p colonoscopy Completely obstructing tumor 15 cm proximal to anus.   Biopsies did show high-grade dysplasia arising in tubular adenoma. Surgery was consulted, however patient continues to pass gas, have small liquid bowel movements, no distention or abdominal pain at this time remaining on clear liquids Pending PET scan CEA 2.9 within normal range.  Anemia  -HGB 8.4, Iron is low but Ferritin and TIBC are elevated, likely anemia of chronic disease B12 low on B12 replacement Continue to monitor H&H with transfusion as needed to maintain hemoglobin greater than 7.  Prostate cancer with mets Oncology following    [facility] will follow.     [name] PA-C 09/29/2020, 11:25 AM  Contact #  [phone]

## 2020-09-29 NOTE — Progress Notes (Signed)
Progress Note    Marcus [name]  [id number] DOB: July 03, 1945  DOA: 09/25/2020 PCP: [name], MD    Brief Narrative:    Medical records reviewed and are as summarized below:  Marcus [name] is an 75 y.o. 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 Arnoldo Morale on 9/4 and anemia returning with upper back pain, decreased appetite and black stool.  EGD 9/12- mass biopsied.  CT scan of abdomen also shows possible mass.  Oncology consult.    Assessment/Plan:   Active Problems:   Prostate cancer (HCC)   Hyponatremia   GI bleed   Esophageal cancer (HCC)   Esophageal mass   Colonic mass   Goals of care, counseling/discussion   Symptomatic microcytic anemia 2/2 GI bleed, Iron def and Vit B12 def -EGD 9/12- mass biopsied: Poorly differentiated carcinoma -CT scan of abdomen also shows mass- colonoscopy today found sigmoid obstructing mass --Hgb 8's -s/p IM Vit B12 x 3 days Plan: --Monitor Hgb and transfuse PRN --cont IV iron x3 days  Sigmoid obstructing mass --No pain and still having BM's. --GenSurg consulted Plan: --surgery to consider diverting ostomy vs sigmoid resection with end colostomy --cont clear liquid diet with boost  Esophageal mass  --between 25 to 30 cm compatible with poorly differentiated carcinoma, immunophenotype consistent with squamous cell carcinoma. --further eval and treatment per oncology  Postsurgical back pain -from recent thoracic spine surgery.  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 Arnoldo Morale on 9/4.  Surgical wound appears clean.  No focal neuro symptoms or bowel or bladder habit change. --oxycodone PRN -Outpatient follow-up with neurosurgery -PT/OT eval- home health   current,  metastatic prostate cancer History of prostate cancer with osseous mets s/p radical prostatectomy and radiation in 2015.   --confirmed with biopsy from laminectomy --further eval and treatment per oncology   Pyuria/bacteriuria -patient denies UTI symptoms, fever, chills, suprapubic pain or lower back pain. -No indication for treatment.  Low B12 -Vit B12 140 -s/p IM Vit B12 x 3 days  Hypokalemia --monitor and replete with oral potassium  Hyponatremia, improved --monitor with daily labs  Vitamin D def -will need replacement after procedures   DVT prophylaxis: scd Code Status: Full Code.  Disposition Plan: Status is: inpt   Dispo: The patient is from: Home              Anticipated d/c is to: Home              Patient currently is not medically stable to d/c.  Sigmoid obstructing mass, pending GenSurg management    Difficult to place patient No    Medical Consultants:   GI oncology  Subjective:   Pt denied pain.  Reported frequent liquid stool about every 45 min.     Objective:    Vitals:   09/28/20 2227 09/29/20 0436 09/29/20 1322 09/29/20 2019  BP: (!) 145/77 (!) 141/72 (!) 147/69 (!) 154/56  Pulse: 77 74 72 76  Resp: '17 17  20  '$ Temp: 98.3 F (36.8 C) 98.5 F (36.9 C) 98.6 F (37 C) 99.4 F (37.4 C)  TempSrc:   Oral Oral  SpO2: 98% 100% 99% 100%  Weight:      Height:        Intake/Output Summary (Last 24  hours) at 09/29/2020 2321 Last data filed at 09/29/2020 1600 Gross per 24 hour  Intake 110 ml  Output --  Net 110 ml   Filed Weights   09/25/20 0617 09/26/20 1245 09/28/20 0800  Weight: 70.7 kg 74.8 kg 74.8 kg    Exam:  Constitutional: NAD, AAOx3 HEENT: conjunctivae and lids normal, EOMI CV: No cyanosis.   RESP: normal respiratory effort, on RA Extremities: No effusions, edema in BLE SKIN: warm, dry Neuro: II - XII grossly intact.   Psych: Normal mood and affect.  Appropriate judgement and reason    Data Reviewed:   I have  personally reviewed following labs and imaging studies:  Labs: Labs show the following:   Basic Metabolic Panel: Recent Labs  Lab 09/25/20 1304 09/26/20 0641 09/27/20 0414 09/28/20 0441 09/29/20 0505  NA 138 131* 128* 132* 130*  K 3.6 3.2* 3.5 3.2* 3.4*  CL 107 101 98 102 101  CO2 21* 22 21* 22 20*  GLUCOSE 91 129* 107* 122* 112*  BUN 16 9 7* 6* <5*  CREATININE 0.96 0.90 0.78 0.98 0.96  CALCIUM 8.5* 7.8* 8.0* 8.0* 8.0*  MG  --   --   --   --  1.9   GFR Estimated Creatinine Clearance: 71.8 mL/min (by C-G formula based on SCr of 0.96 mg/dL). Liver Function Tests: Recent Labs  Lab 09/25/20 1304  AST 20  ALT 26  ALKPHOS 67  BILITOT 0.9  PROT 6.3*  ALBUMIN 3.1*   No results for input(s): LIPASE, AMYLASE in the last 168 hours. No results for input(s): AMMONIA in the last 168 hours. Coagulation profile Recent Labs  Lab 09/25/20 1554  INR 1.1    CBC: Recent Labs  Lab 09/25/20 1304 09/25/20 1554 09/25/20 2253 09/26/20 0641 09/27/20 0414 09/29/20 0505  WBC 8.2  --   --  7.2 6.7 7.3  NEUTROABS 6.4  --   --   --   --   --   HGB 9.9* 9.5* 8.5* 8.2* 8.4* 8.1*  HCT 31.4* 29.6* 25.7* 25.1* 25.5* 24.5*  MCV 80.5  --   --  76.5* 76.6* 76.1*  PLT 187  --   --  199 221 224   Cardiac Enzymes: No results for input(s): CKTOTAL, CKMB, CKMBINDEX, TROPONINI in the last 168 hours. BNP (last 3 results) No results for input(s): PROBNP in the last 8760 hours. CBG: No results for input(s): GLUCAP in the last 168 hours. D-Dimer: No results for input(s): DDIMER in the last 72 hours. Hgb A1c: No results for input(s): HGBA1C in the last 72 hours. Lipid Profile: No results for input(s): CHOL, HDL, LDLCALC, TRIG, CHOLHDL, LDLDIRECT in the last 72 hours. Thyroid function studies: No results for input(s): TSH, T4TOTAL, T3FREE, THYROIDAB in the last 72 hours.  Invalid input(s): FREET3 Anemia work up: No results for input(s): VITAMINB12, FOLATE, FERRITIN, TIBC, IRON, RETICCTPCT  in the last 72 hours.  Sepsis Labs: Recent Labs  Lab 09/25/20 1304 09/26/20 0641 09/27/20 0414 09/29/20 0505  WBC 8.2 7.2 6.7 7.3    Microbiology No results found for this or any previous visit (from the past 240 hour(s)).   Procedures and diagnostic studies:  No results found.  Medications:    enoxaparin (LOVENOX) injection  40 mg Subcutaneous Q24H   feeding supplement  1 Container Oral TID BM   pantoprazole (PROTONIX) IV  40 mg Intravenous Q12H   Continuous Infusions:  iron sucrose 200 mg (09/29/20 0854)     LOS: 2 days  Enzo Bi  [facility]   09/29/2020, 11:21 PM

## 2020-09-29 NOTE — Progress Notes (Signed)
1 Day Post-Op  Subjective: No new complaints today.  No bloating or pain.  Passing flatus and some liquid stool at times, but minimal.  Tolerating CLD  ROS: See above, otherwise other systems negative  Objective: Vital signs in last 24 hours: Temp:  [98.3 F (36.8 C)-99.3 F (37.4 C)] 98.5 F (36.9 C) (09/15 0436) Pulse Rate:  [66-77] 74 (09/15 0436) Resp:  [13-25] 17 (09/15 0436) BP: (125-168)/(60-77) 141/72 (09/15 0436) SpO2:  [92 %-100 %] 100 % (09/15 0436) Last BM Date: 09/28/20  Intake/Output from previous day: 09/14 0701 - 09/15 0700 In: 1794.6 [P.O.:360; I.V.:1434.6] Out: 0  Intake/Output this shift: No intake/output data recorded.  PE: Gen: NAD Heart: regular Lungs: CTAB Abd: soft, NT, ND  Lab Results:  Recent Labs    09/27/20 0414 09/29/20 0505  WBC 6.7 7.3  HGB 8.4* 8.1*  HCT 25.5* 24.5*  PLT 221 224   BMET Recent Labs    09/28/20 0441 09/29/20 0505  NA 132* 130*  K 3.2* 3.4*  CL 102 101  CO2 22 20*  GLUCOSE 122* 112*  BUN 6* <5*  CREATININE 0.98 0.96  CALCIUM 8.0* 8.0*   PT/INR No results for input(s): LABPROT, INR in the last 72 hours. CMP     Component Value Date/Time   NA 130 (L) 09/29/2020 0505   K 3.4 (L) 09/29/2020 0505   CL 101 09/29/2020 0505   CO2 20 (L) 09/29/2020 0505   GLUCOSE 112 (H) 09/29/2020 0505   BUN <5 (L) 09/29/2020 0505   CREATININE 0.96 09/29/2020 0505   CREATININE 1.01 09/15/2020 1415   CALCIUM 8.0 (L) 09/29/2020 0505   PROT 6.3 (L) 09/25/2020 1304   ALBUMIN 3.1 (L) 09/25/2020 1304   AST 20 09/25/2020 1304   AST 13 (L) 09/15/2020 1415   ALT 26 09/25/2020 1304   ALT 11 09/15/2020 1415   ALKPHOS 67 09/25/2020 1304   BILITOT 0.9 09/25/2020 1304   BILITOT 0.7 09/15/2020 1415   GFRNONAA >60 09/29/2020 0505   GFRNONAA >60 09/15/2020 1415   GFRAA >60 09/17/2018 0607   Lipase     Component Value Date/Time   LIPASE 36 09/14/2018 1838       Studies/Results: No results  found.  Anti-infectives: Anti-infectives (From admission, onward)    None        Assessment/Plan Metastatic prostate cancer - s/p radical prostatectomy and radiation therapy in 2015/2016 - s/p laminectomy for 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 Arnoldo Morale Obstructing sigmoid colon mass - while mass is unable to be traversed by scope and appears obstructing on CT, pt clinically continues to have some bowel function and is without significant abdominal pain or distention on exam - ok to continue CLD at this time but DO NOT advance past this - CEA normal, biopsies from colonoscopy 9/14 pending  - will await path and oncology evaluation given multiple different malignancies suspected.  Would like their thoughts and opinions. Circumferential non-obstructing esophageal mass - s/p EGD 9/12 and path with poorly differentiated carcinoma - given metastatic disease, esophagectomy or resection seems unlikely to be an option   FEN - CLD/IVFs VTE - hgb stable, ok for chemical prophylaxis from our standpoint given multiple malignancies, high risk for thrombotic event. (Not active bleeding noted currently) ID - none needed   LOS: 2 days    Henreitta Cea , [facility] 09/29/2020, 8:17 AM Please see Amion for pager number during day hours 7:00am-4:30pm or 7:00am -11:30am  on weekends

## 2020-09-30 ENCOUNTER — Encounter (HOSPITAL_COMMUNITY)
Admission: EM | Disposition: A | Discharge status: Skilled Nursing Facility | Payer: Self-pay | Source: Home / Self Care | Attending: Hospitalist

## 2020-09-30 ENCOUNTER — Inpatient Hospital Stay (HOSPITAL_COMMUNITY): Payer: Medicare Other | Admitting: Anesthesiology

## 2020-09-30 ENCOUNTER — Encounter (HOSPITAL_COMMUNITY): Payer: Self-pay | Admitting: Internal Medicine

## 2020-09-30 DIAGNOSIS — K6389 Other specified diseases of intestine: Secondary | ICD-10-CM | POA: Diagnosis not present

## 2020-09-30 HISTORY — PX: COLON RESECTION: SHX5231

## 2020-09-30 LAB — BASIC METABOLIC PANEL
Anion gap: 8 (ref 5–15)
BUN: <5 mg/dL — ABNORMAL LOW (ref 8–23)
CO2: 21 mmol/L — ABNORMAL LOW (ref 22–32)
Calcium: 8.2 mg/dL — ABNORMAL LOW (ref 8.9–10.3)
Chloride: 104 mmol/L (ref 98–111)
Creatinine, Ser: 0.8 mg/dL (ref 0.61–1.24)
GFR, Estimated: >60 mL/min (ref >60)
Glucose, Bld: 106 mg/dL — ABNORMAL HIGH (ref 70–99)
Potassium: 3.3 mmol/L — ABNORMAL LOW (ref 3.5–5.1)
Sodium: 133 mmol/L — ABNORMAL LOW (ref 135–145)

## 2020-09-30 LAB — MAGNESIUM: Magnesium: 1.9 mg/dL (ref 1.7–2.4)

## 2020-09-30 LAB — CBC
HCT: 24.4 % — ABNORMAL LOW (ref 39.0–52.0)
Hemoglobin: 8.2 g/dL — ABNORMAL LOW (ref 13.0–17.0)
MCH: 25.2 pg — ABNORMAL LOW (ref 26.0–34.0)
MCHC: 33.6 g/dL (ref 30.0–36.0)
MCV: 75.1 fL — ABNORMAL LOW (ref 80.0–100.0)
Platelets: 238 10*3/uL (ref 150–400)
RBC: 3.25 MIL/uL — ABNORMAL LOW (ref 4.22–5.81)
RDW: 17.7 % — ABNORMAL HIGH (ref 11.5–15.5)
WBC: 7.6 10*3/uL (ref 4.0–10.5)
nRBC: 0 % (ref 0.0–0.2)

## 2020-09-30 LAB — GLUCOSE, CAPILLARY: Glucose-Capillary: 138 mg/dL — ABNORMAL HIGH (ref 70–99)

## 2020-09-30 LAB — PREPARE RBC (CROSSMATCH)

## 2020-09-30 SURGERY — COLECTOMY, HAND-ASSISTED, LAPAROSCOPIC
Anesthesia: General

## 2020-09-30 MED ORDER — BUPIVACAINE LIPOSOME 1.3 % IJ SUSP
INTRAMUSCULAR | Status: AC
  Filled 2020-09-30: qty 10

## 2020-09-30 MED ORDER — POTASSIUM CHLORIDE 10 MEQ/100ML IV SOLN
10.0000 meq | INTRAVENOUS | Status: DC
Start: 2020-09-30 — End: 2020-09-30

## 2020-09-30 MED ORDER — ACETAMINOPHEN 500 MG PO TABS
1000.0000 mg | ORAL_TABLET | Freq: Four times a day (QID) | ORAL | Status: DC
  Administered 2020-09-30 – 2020-10-10 (×17): 1000 mg via ORAL
  Filled 2020-09-30 (×23): qty 2

## 2020-09-30 MED ORDER — BUPIVACAINE-EPINEPHRINE 0.25% -1:200000 IJ SOLN
INTRAMUSCULAR | Status: DC | PRN
  Administered 2020-09-30: 30 mL

## 2020-09-30 MED ORDER — FENTANYL CITRATE PF 50 MCG/ML IJ SOSY: 25.0000 ug | PREFILLED_SYRINGE | INTRAMUSCULAR | Status: DC | PRN

## 2020-09-30 MED ORDER — AMISULPRIDE (ANTIEMETIC) 5 MG/2ML IV SOLN: 10.0000 mg | Freq: Once | INTRAVENOUS | Status: DC | PRN

## 2020-09-30 MED ORDER — POTASSIUM CHLORIDE 2 MEQ/ML IV SOLN: INTRAVENOUS | Status: DC

## 2020-09-30 MED ORDER — BUPIVACAINE-EPINEPHRINE (PF) 0.25% -1:200000 IJ SOLN
INTRAMUSCULAR | Status: AC
  Filled 2020-09-30: qty 30

## 2020-09-30 MED ORDER — LACTATED RINGERS IV SOLN: INTRAVENOUS | Status: DC | PRN

## 2020-09-30 MED ORDER — VITAMIN B-12 1000 MCG PO TABS
500.0000 ug | ORAL_TABLET | Freq: Every day | ORAL | Status: DC
  Administered 2020-10-01 – 2020-10-10 (×7): 500 ug via ORAL
  Filled 2020-09-30 (×8): qty 1

## 2020-09-30 MED ORDER — OXYCODONE HCL 5 MG PO TABS
5.0000 mg | ORAL_TABLET | ORAL | Status: DC | PRN
Start: 2020-09-30 — End: 2020-10-04
  Administered 2020-09-30 – 2020-10-02 (×5): 10 mg via ORAL
  Filled 2020-09-30 (×5): qty 2

## 2020-09-30 MED ORDER — KCL IN DEXTROSE-NACL 20-5-0.45 MEQ/L-%-% IV SOLN
INTRAVENOUS | Status: DC
  Filled 2020-09-30 (×9): qty 1000

## 2020-09-30 MED ORDER — FENTANYL CITRATE (PF) 100 MCG/2ML IJ SOLN
INTRAMUSCULAR | Status: AC
  Filled 2020-09-30: qty 2

## 2020-09-30 MED ORDER — SUGAMMADEX SODIUM 200 MG/2ML IV SOLN
INTRAVENOUS | Status: DC | PRN
  Administered 2020-09-30: 200 mg via INTRAVENOUS

## 2020-09-30 MED ORDER — 0.9 % SODIUM CHLORIDE (POUR BTL) OPTIME
TOPICAL | Status: DC | PRN
  Administered 2020-09-30: 2000 mL

## 2020-09-30 MED ORDER — ONDANSETRON HCL 4 MG/2ML IJ SOLN
INTRAMUSCULAR | Status: DC | PRN
  Administered 2020-09-30: 4 mg via INTRAVENOUS

## 2020-09-30 MED ORDER — SUCCINYLCHOLINE CHLORIDE 200 MG/10ML IV SOSY
PREFILLED_SYRINGE | INTRAVENOUS | Status: DC | PRN
  Administered 2020-09-30: 100 mg via INTRAVENOUS

## 2020-09-30 MED ORDER — PANTOPRAZOLE SODIUM 40 MG PO TBEC
40.0000 mg | DELAYED_RELEASE_TABLET | Freq: Two times a day (BID) | ORAL | Status: DC
  Administered 2020-09-30 – 2020-10-10 (×15): 40 mg via ORAL
  Filled 2020-09-30 (×16): qty 1

## 2020-09-30 MED ORDER — LIDOCAINE 2% (20 MG/ML) 5 ML SYRINGE
INTRAMUSCULAR | Status: DC | PRN
  Administered 2020-09-30: 80 mg via INTRAVENOUS

## 2020-09-30 MED ORDER — ROCURONIUM BROMIDE 10 MG/ML (PF) SYRINGE
PREFILLED_SYRINGE | INTRAVENOUS | Status: DC | PRN
  Administered 2020-09-30: 20 mg via INTRAVENOUS
  Administered 2020-09-30: 50 mg via INTRAVENOUS
  Administered 2020-09-30: 30 mg via INTRAVENOUS
  Administered 2020-09-30: 10 mg via INTRAVENOUS

## 2020-09-30 MED ORDER — BUPIVACAINE LIPOSOME 1.3 % IJ SUSP
INTRAMUSCULAR | Status: DC | PRN
  Administered 2020-09-30: 20 mL

## 2020-09-30 MED ORDER — PHENYLEPHRINE 40 MCG/ML (10ML) SYRINGE FOR IV PUSH (FOR BLOOD PRESSURE SUPPORT)
PREFILLED_SYRINGE | INTRAVENOUS | Status: AC
  Filled 2020-09-30: qty 20

## 2020-09-30 MED ORDER — PROPOFOL 10 MG/ML IV BOLUS
INTRAVENOUS | Status: DC | PRN
  Administered 2020-09-30: 130 mg via INTRAVENOUS

## 2020-09-30 MED ORDER — POTASSIUM CHLORIDE 20 MEQ PO PACK
40.0000 meq | PACK | Freq: Once | ORAL | Status: DC
Start: 2020-09-30 — End: 2020-10-04

## 2020-09-30 MED ORDER — ROCURONIUM BROMIDE 10 MG/ML (PF) SYRINGE
PREFILLED_SYRINGE | INTRAVENOUS | Status: AC
  Filled 2020-09-30: qty 10

## 2020-09-30 MED ORDER — ENOXAPARIN SODIUM 40 MG/0.4ML IJ SOSY
40.0000 mg | PREFILLED_SYRINGE | INTRAMUSCULAR | Status: DC
  Administered 2020-10-01 – 2020-10-09 (×5): 40 mg via SUBCUTANEOUS
  Filled 2020-09-30 (×8): qty 0.4

## 2020-09-30 MED ORDER — DEXAMETHASONE SODIUM PHOSPHATE 10 MG/ML IJ SOLN
INTRAMUSCULAR | Status: DC | PRN
  Administered 2020-09-30: 10 mg via INTRAVENOUS

## 2020-09-30 MED ORDER — PHENYLEPHRINE HCL-NACL 20-0.9 MG/250ML-% IV SOLN
INTRAVENOUS | Status: DC | PRN
  Administered 2020-09-30: 50 ug/min via INTRAVENOUS

## 2020-09-30 MED ORDER — SODIUM CHLORIDE 0.9 % IV SOLN
2.0000 g | INTRAVENOUS | Status: AC
  Administered 2020-09-30: 2 g via INTRAVENOUS
  Filled 2020-09-30: qty 2

## 2020-09-30 MED ORDER — PHENYLEPHRINE 40 MCG/ML (10ML) SYRINGE FOR IV PUSH (FOR BLOOD PRESSURE SUPPORT)
PREFILLED_SYRINGE | INTRAVENOUS | Status: DC | PRN
  Administered 2020-09-30 (×3): 80 ug via INTRAVENOUS

## 2020-09-30 MED ORDER — FENTANYL CITRATE (PF) 100 MCG/2ML IJ SOLN
INTRAMUSCULAR | Status: DC | PRN
  Administered 2020-09-30: 100 ug via INTRAVENOUS
  Administered 2020-09-30 (×4): 50 ug via INTRAVENOUS

## 2020-09-30 SURGICAL SUPPLY — 79 items
APL PRP STRL LF DISP 70% ISPRP (MISCELLANEOUS) ×1
APPLIER CLIP 5 13 M/L LIGAMAX5 (MISCELLANEOUS)
APPLIER CLIP ROT 10 11.4 M/L (STAPLE)
APR CLP MED LRG 11.4X10 (STAPLE)
APR CLP MED LRG 5 ANG JAW (MISCELLANEOUS)
BAG COUNTER SPONGE SURGICOUNT (BAG) IMPLANT
BAG SPNG CNTER NS LX DISP (BAG)
BLADE EXTENDED COATED 6.5IN (ELECTRODE) ×1 IMPLANT
BRR ADH 5X3 SEPRAFILM 6 SHT (MISCELLANEOUS)
BRR ADH 6X5 SEPRAFILM 1 SHT (MISCELLANEOUS)
CELLS DAT CNTRL 66122 CELL SVR (MISCELLANEOUS) IMPLANT
CHLORAPREP W/TINT 26 (MISCELLANEOUS) ×2 IMPLANT
CLIP APPLIE 5 13 M/L LIGAMAX5 (MISCELLANEOUS) IMPLANT
CLIP APPLIE ROT 10 11.4 M/L (STAPLE) IMPLANT
COUNTER NEEDLE 20 DBL MAG RED (NEEDLE) ×2 IMPLANT
COVER MAYO STAND STRL (DRAPES) ×6 IMPLANT
DRAIN CHANNEL 19F RND (DRAIN) ×1 IMPLANT
DRAPE LAPAROSCOPIC ABDOMINAL (DRAPES) ×2 IMPLANT
DRSG OPSITE POSTOP 4X6 (GAUZE/BANDAGES/DRESSINGS) ×1 IMPLANT
DRSG OPSITE POSTOP 4X8 (GAUZE/BANDAGES/DRESSINGS) IMPLANT
DRSG TEGADERM 4X4.75 (GAUZE/BANDAGES/DRESSINGS) ×1 IMPLANT
DRSG TELFA 3X8 NADH (GAUZE/BANDAGES/DRESSINGS) IMPLANT
ELECT REM PT RETURN 15FT ADLT (MISCELLANEOUS) ×2 IMPLANT
EVACUATOR SILICONE 100CC (DRAIN) ×1 IMPLANT
GAUZE SPONGE 4X4 12PLY STRL (GAUZE/BANDAGES/DRESSINGS) IMPLANT
GLOVE SRG 8 PF TXTR STRL LF DI (GLOVE) ×1 IMPLANT
GLOVE SURG POLY ORTHO LF SZ7.5 (GLOVE) ×4 IMPLANT
GLOVE SURG UNDER POLY LF SZ8 (GLOVE) ×2
GOWN STRL REUS W/TWL XL LVL3 (GOWN DISPOSABLE) ×8 IMPLANT
KIT TURNOVER KIT A (KITS) ×2 IMPLANT
LEGGING LITHOTOMY PAIR STRL (DRAPES) IMPLANT
LIGASURE IMPACT 36 18CM CVD LR (INSTRUMENTS) ×1 IMPLANT
PACK COLON (CUSTOM PROCEDURE TRAY) ×2 IMPLANT
PAD DRESSING TELFA 3X8 NADH (GAUZE/BANDAGES/DRESSINGS) IMPLANT
PAD POSITIONING PINK XL (MISCELLANEOUS) IMPLANT
PENCIL SMOKE EVACUATOR (MISCELLANEOUS) IMPLANT
PROTECTOR NERVE ULNAR (MISCELLANEOUS) IMPLANT
RELOAD BL CONTOUR (ENDOMECHANICALS) ×2 IMPLANT
RELOAD GRN CONTOUR (ENDOMECHANICALS) ×2 IMPLANT
RELOAD STAPLE 40 BLU REG (ENDOMECHANICALS) IMPLANT
RELOAD STAPLE 40 GRN THCK (ENDOMECHANICALS) IMPLANT
RETRACTOR WND ALEXIS 18 MED (MISCELLANEOUS) IMPLANT
RTRCTR WOUND ALEXIS 18CM MED (MISCELLANEOUS)
SEPRAFILM MEMBRANE 5X6 (MISCELLANEOUS) IMPLANT
SEPRAFILM PROCEDURAL PACK 3X5 (MISCELLANEOUS) IMPLANT
SET IRRIG TUBING LAPAROSCOPIC (IRRIGATION / IRRIGATOR) ×2 IMPLANT
SET TUBE SMOKE EVAC HIGH FLOW (TUBING) ×2 IMPLANT
SHEARS HARMONIC ACE PLUS 36CM (ENDOMECHANICALS) ×2 IMPLANT
SLEEVE XCEL OPT CAN 5 100 (ENDOMECHANICALS) ×4 IMPLANT
SPONGE GAUZE 2X2 8PLY STRL LF (GAUZE/BANDAGES/DRESSINGS) ×1 IMPLANT
SPONGE T-LAP 18X18 ~~LOC~~+RFID (SPONGE) IMPLANT
STAPLER CVD CUT GN 40 RELOAD (ENDOMECHANICALS) ×2 IMPLANT
STAPLER CVD CUT GRN 40 RELOAD (ENDOMECHANICALS) IMPLANT
STAPLER VISISTAT 35W (STAPLE) ×2 IMPLANT
SUT ETHILON 2 0 PS N (SUTURE) ×1 IMPLANT
SUT PDS AB 0 CTX 60 (SUTURE) IMPLANT
SUT PDS AB 1 CTX 36 (SUTURE) ×2 IMPLANT
SUT PDS AB 1 TP1 96 (SUTURE) IMPLANT
SUT SILK 2 0 (SUTURE) ×2
SUT SILK 2 0 SH CR/8 (SUTURE) ×3 IMPLANT
SUT SILK 2-0 18XBRD TIE 12 (SUTURE) ×1 IMPLANT
SUT SILK 3 0 (SUTURE) ×2
SUT SILK 3 0 SH CR/8 (SUTURE) ×2 IMPLANT
SUT SILK 3-0 18XBRD TIE 12 (SUTURE) ×1 IMPLANT
SUT VIC AB 1 CTX 18 (SUTURE) IMPLANT
SUT VIC AB 3-0 SH 18 (SUTURE) ×1 IMPLANT
SYS LAPSCP GELPORT 120MM (MISCELLANEOUS)
SYS WOUND ALEXIS 18CM MED (MISCELLANEOUS) ×2
SYSTEM LAPSCP GELPORT 120MM (MISCELLANEOUS) IMPLANT
SYSTEM WOUND ALEXIS 18CM MED (MISCELLANEOUS) IMPLANT
TAPE UMBILICAL 1/8 X36 TWILL (MISCELLANEOUS) IMPLANT
TOWEL OR 17X26 10 PK STRL BLUE (TOWEL DISPOSABLE) IMPLANT
TOWEL OR NON WOVEN STRL DISP B (DISPOSABLE) ×2 IMPLANT
TRAY FOLEY MTR SLVR 16FR STAT (SET/KITS/TRAYS/PACK) IMPLANT
TROCAR BLADELESS OPT 5 100 (ENDOMECHANICALS) ×2 IMPLANT
TROCAR XCEL 12X100 BLDLESS (ENDOMECHANICALS) IMPLANT
TROCAR XCEL NON-BLD 11X100MML (ENDOMECHANICALS) IMPLANT
TUBING CONNECTING 10 (TUBING) ×1 IMPLANT
TUBING ENDO SMARTCAP (MISCELLANEOUS) ×1 IMPLANT

## 2020-09-30 NOTE — Transfer of Care (Signed)
Immediate Anesthesia Transfer of Care Note  Patient: Marcus [name]  Procedure(s) Performed: HAND ASSISTED LAPAROSCOPIC HARTMAN'S PROCEDURE WITH END COLOSTOMY; TAP BLOCK, PLACEMENT OF PEG TUBE  Patient Location: PACU  Anesthesia Type:General  Level of Consciousness: awake, alert , oriented and patient cooperative  Airway & Oxygen Therapy: Patient Spontanous Breathing and Patient connected to face mask oxygen  Post-op Assessment: Report given to RN and Post -op Vital signs reviewed and stable  Post vital signs: Reviewed and stable  Last Vitals:  Vitals Value Taken Time  BP 164/73 09/30/20 1705  Temp    Pulse 84 09/30/20 1707  Resp 12 09/30/20 1708  SpO2 99 % 09/30/20 1707  Vitals shown include unvalidated device data.  Last Pain:  Vitals:   09/30/20 1400  TempSrc:   PainSc: 0-No pain         Complications: No notable events documented.

## 2020-09-30 NOTE — Anesthesia Procedure Notes (Signed)
Procedure Name: Intubation Date/Time: 09/30/2020 2:15 PM Performed by: Lavina Hamman, CRNA Pre-anesthesia Checklist: Patient identified, Emergency Drugs available, Suction available, Patient being monitored and Timeout performed Patient Re-evaluated:Patient Re-evaluated prior to induction Oxygen Delivery Method: Circle system utilized Preoxygenation: Pre-oxygenation with 100% oxygen Induction Type: IV induction Ventilation: Mask ventilation without difficulty Laryngoscope Size: Mac and 4 Grade View: Grade I Tube type: Oral Tube size: 7.5 mm Number of attempts: 1 Airway Equipment and Method: Stylet Placement Confirmation: ETT inserted through vocal cords under direct vision, positive ETCO2, CO2 detector and breath sounds checked- equal and bilateral Secured at: 23 cm Tube secured with: Tape Dental Injury: Teeth and Oropharynx as per pre-operative assessment  Comments: ATOI

## 2020-09-30 NOTE — Progress Notes (Signed)
PT Cancellation Note  Patient Details Name: Marcus [name] MRN: [id number] DOB: Feb 14, 1945   Cancelled Treatment:    Reason Eval/Treat Not Completed: Other (comment) Pt declined PT due to going for colostomy placement shortly.  Will f/u post op as appropriate. Abran Richard, PT Acute Rehab Services Pager [phone] [facility] [place] 09/30/2020, 10:21 AM

## 2020-09-30 NOTE — Progress Notes (Signed)
Progress Note    Marcus [name]  [id number] DOB: 11/20/1945  DOA: 09/25/2020 PCP: [name], MD    Brief Narrative:    Medical records reviewed and are as summarized below:  Marcus [name] is an 75 y.o. 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 Arnoldo Morale on 9/4 and anemia returning with upper back pain, decreased appetite and black stool.  EGD 9/12- mass biopsied.  CT scan of abdomen also shows possible mass.  Oncology consult.    Assessment/Plan:   Active Problems:   Prostate cancer (HCC)   Hyponatremia   GI bleed   Esophageal cancer (HCC)   Esophageal mass   Colonic mass   Goals of care, counseling/discussion   Symptomatic microcytic anemia 2/2 GI bleed, Iron def and Vit B12 def -EGD 9/12- mass biopsied: Poorly differentiated carcinoma -CT scan of abdomen also shows mass- colonoscopy today found sigmoid obstructing mass --Hgb 8's -s/p IM Vit B12 x 3 days Plan: --Monitor Hgb and transfuse PRN --cont IV iron x3 days  Sigmoid obstructing mass --No pain and still having BM's. --GenSurg consulted Plan: --Hartman's procedure with end colostomy today  Esophageal mass  --between 25 to 30 cm compatible with poorly differentiated carcinoma, immunophenotype consistent with squamous cell carcinoma. Plan: --PEG tube placement today  Postsurgical back pain -from recent thoracic spine surgery.  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 Arnoldo Morale on 9/4.  Surgical wound appears clean.  No focal neuro symptoms or bowel or bladder habit change. Plan: --oxycodone PRN -Outpatient follow-up with neurosurgery -PT/OT eval- home health   current, metastatic prostate cancer History of prostate cancer with osseous  mets s/p radical prostatectomy and radiation in 2015.   --confirmed with biopsy from laminectomy Plan: --further eval and treatment per oncology   Pyuria/bacteriuria -patient denies UTI symptoms, fever, chills, suprapubic pain or lower back pain. -No indication for treatment.  Hypokalemia --monitor and replete PRN  Hyponatremia, improved --monitor with daily labs  Vitamin D def -will need replacement after procedures   DVT prophylaxis: scd Code Status: Full Code.  Disposition Plan: Status is: inpt   Dispo: The patient is from: Home              Anticipated d/c is to: Home              Patient currently is not medically stable to d/c.  Sigmoid obstructing mass, pending GenSurg management    Difficult to place patient No    Medical Consultants:   GI oncology  Subjective:   Pt reported doing ok, no pain, still having liquid stool.  Missing eating solid foods.  Will go for surgery today.   Objective:    Vitals:   09/29/20 0436 09/29/20 1322 09/29/20 2019 09/30/20 0625  BP: (!) 141/72 (!) 147/69 (!) 154/56 95/71  Pulse: 74 72 76 77  Resp: '17  20 20  '$ Temp: 98.5 F (36.9 C) 98.6 F (37 C) 99.4 F (37.4 C) 99.1 F (37.3 C)  TempSrc:  Oral Oral Oral  SpO2: 100% 99% 100% 98%  Weight:      Height:        Intake/Output Summary (Last 24 hours) at 09/30/2020 1601 Last data filed at 09/30/2020 1519 Gross per 24 hour  Intake --  Output  150 ml  Net -150 ml   Filed Weights   09/25/20 0617 09/26/20 1245 09/28/20 0800  Weight: 70.7 kg 74.8 kg 74.8 kg    Exam:  Constitutional: NAD, AAOx3 HEENT: conjunctivae and lids normal, EOMI CV: No cyanosis.   RESP: normal respiratory effort, on RA Extremities: No effusions, edema in BLE SKIN: warm, dry Neuro: II - XII grossly intact.   Psych: Normal mood and affect.  Appropriate judgement and reason    Data Reviewed:   I have personally reviewed following labs and imaging studies:  Labs: Labs show the  following:   Basic Metabolic Panel: Recent Labs  Lab 09/26/20 0641 09/27/20 0414 09/28/20 0441 09/29/20 0505 09/30/20 0444  NA 131* 128* 132* 130* 133*  K 3.2* 3.5 3.2* 3.4* 3.3*  CL 101 98 102 101 104  CO2 22 21* 22 20* 21*  GLUCOSE 129* 107* 122* 112* 106*  BUN 9 7* 6* <5* <5*  CREATININE 0.90 0.78 0.98 0.96 0.80  CALCIUM 7.8* 8.0* 8.0* 8.0* 8.2*  MG  --   --   --  1.9 1.9   GFR Estimated Creatinine Clearance: 86.2 mL/min (by C-G formula based on SCr of 0.8 mg/dL). Liver Function Tests: Recent Labs  Lab 09/25/20 1304  AST 20  ALT 26  ALKPHOS 67  BILITOT 0.9  PROT 6.3*  ALBUMIN 3.1*   No results for input(s): LIPASE, AMYLASE in the last 168 hours. No results for input(s): AMMONIA in the last 168 hours. Coagulation profile Recent Labs  Lab 09/25/20 1554  INR 1.1    CBC: Recent Labs  Lab 09/25/20 1304 09/25/20 1554 09/25/20 2253 09/26/20 0641 09/27/20 0414 09/29/20 0505 09/30/20 0444  WBC 8.2  --   --  7.2 6.7 7.3 7.6  NEUTROABS 6.4  --   --   --   --   --   --   HGB 9.9*   < > 8.5* 8.2* 8.4* 8.1* 8.2*  HCT 31.4*   < > 25.7* 25.1* 25.5* 24.5* 24.4*  MCV 80.5  --   --  76.5* 76.6* 76.1* 75.1*  PLT 187  --   --  199 221 224 238   < > = values in this interval not displayed.   Cardiac Enzymes: No results for input(s): CKTOTAL, CKMB, CKMBINDEX, TROPONINI in the last 168 hours. BNP (last 3 results) No results for input(s): PROBNP in the last 8760 hours. CBG: No results for input(s): GLUCAP in the last 168 hours. D-Dimer: No results for input(s): DDIMER in the last 72 hours. Hgb A1c: No results for input(s): HGBA1C in the last 72 hours. Lipid Profile: No results for input(s): CHOL, HDL, LDLCALC, TRIG, CHOLHDL, LDLDIRECT in the last 72 hours. Thyroid function studies: No results for input(s): TSH, T4TOTAL, T3FREE, THYROIDAB in the last 72 hours.  Invalid input(s): FREET3 Anemia work up: No results for input(s): VITAMINB12, FOLATE, FERRITIN, TIBC,  IRON, RETICCTPCT in the last 72 hours.  Sepsis Labs: Recent Labs  Lab 09/26/20 0641 09/27/20 0414 09/29/20 0505 09/30/20 0444  WBC 7.2 6.7 7.3 7.6    Microbiology No results found for this or any previous visit (from the past 240 hour(s)).   Procedures and diagnostic studies:  No results found.  Medications:    [MAR Hold] enoxaparin (LOVENOX) injection  40 mg Subcutaneous Q24H   [MAR Hold] feeding supplement  1 Container Oral TID BM   [MAR Hold] pantoprazole (PROTONIX) IV  40 mg Intravenous Q12H   [MAR Hold] potassium chloride  40 mEq Oral Once   [MAR Hold] vitamin B-12  500 mcg Oral Daily   Continuous Infusions:  [MAR Hold] iron sucrose 200 mg (09/29/20 0854)     LOS: 3 days   Enzo Bi  [facility]   09/30/2020, 4:01 PM

## 2020-09-30 NOTE — Anesthesia Postprocedure Evaluation (Signed)
Anesthesia Post Note  Patient: Marcus [name]  Procedure(s) Performed: HAND ASSISTED LAPAROSCOPIC HARTMAN'S PROCEDURE WITH END COLOSTOMY; TAP BLOCK, PLACEMENT OF PEG TUBE     Patient location during evaluation: PACU Anesthesia Type: General Level of consciousness: sedated and patient cooperative Pain management: pain level controlled Vital Signs Assessment: post-procedure vital signs reviewed and stable Respiratory status: spontaneous breathing Cardiovascular status: stable Anesthetic complications: no   No notable events documented.  Last Vitals:  Vitals:   09/30/20 1705 09/30/20 1710  BP: (!) 164/73   Pulse: 84 84  Resp:  14  Temp: 36.9 C   SpO2: 97% 96%    Last Pain:  Vitals:   09/30/20 1400  TempSrc:   PainSc: 0-No pain                 Nolon Nations

## 2020-09-30 NOTE — Care Management Important Message (Signed)
Important Message  Patient Details IM Letter given to the Patient. Name: Marcus [name] MRN: [id number] Date of Birth: March 27, 1945   Medicare Important Message Given:  Yes     Kerin Salen 09/30/2020, 10:21 AM

## 2020-09-30 NOTE — Anesthesia Preprocedure Evaluation (Signed)
Anesthesia Evaluation  Patient identified by MRN, date of birth, ID band Patient awake    Reviewed: Allergy & Precautions, NPO status , Patient's Chart, lab work & pertinent test results  Airway Mallampati: II  TM Distance: >3 FB Neck ROM: Full    Dental  (+) Dental Advisory Given   Pulmonary Current Smoker,    breath sounds clear to auscultation       Cardiovascular hypertension,  Rhythm:Regular Rate:Normal     Neuro/Psych negative neurological ROS     GI/Hepatic Neg liver ROS, Colon CA    Endo/Other  negative endocrine ROS  Renal/GU Renal disease     Musculoskeletal  (+) Arthritis ,   Abdominal   Peds  Hematology  (+) anemia ,   Anesthesia Other Findings   Reproductive/Obstetrics                             Lab Results  Component Value Date   WBC 7.6 09/30/2020   HGB 8.2 (L) 09/30/2020   HCT 24.4 (L) 09/30/2020   MCV 75.1 (L) 09/30/2020   PLT 238 09/30/2020   Lab Results  Component Value Date   CREATININE 0.80 09/30/2020   BUN <5 (L) 09/30/2020   NA 133 (L) 09/30/2020   K 3.3 (L) 09/30/2020   CL 104 09/30/2020   CO2 21 (L) 09/30/2020    Anesthesia Physical Anesthesia Plan  ASA: 2  Anesthesia Plan: General   Post-op Pain Management:    Induction: Intravenous  PONV Risk Score and Plan: 1 and Ondansetron, Dexamethasone and Treatment may vary due to age or medical condition  Airway Management Planned: Oral ETT  Additional Equipment:   Intra-op Plan:   Post-operative Plan: Extubation in OR  Informed Consent: I have reviewed the patients History and Physical, chart, labs and discussed the procedure including the risks, benefits and alternatives for the proposed anesthesia with the patient or authorized representative who has indicated his/her understanding and acceptance.     Dental advisory given  Plan Discussed with: CRNA  Anesthesia Plan Comments:          Anesthesia Quick Evaluation

## 2020-09-30 NOTE — Progress Notes (Addendum)
2 Days Post-Op  Subjective: No new complaints today.  Still passing some flatus and a small amount of liquid type stool  ROS: See above, otherwise other systems negative  Objective: Vital signs in last 24 hours: Temp:  [98.6 F (37 C)-99.4 F (37.4 C)] 99.1 F (37.3 C) (09/16 0625) Pulse Rate:  [72-77] 77 (09/16 0625) Resp:  [20] 20 (09/16 0625) BP: (95-154)/(56-71) 95/71 (09/16 0625) SpO2:  [98 %-100 %] 98 % (09/16 0625) Last BM Date: 09/28/20  Intake/Output from previous day: 09/15 0701 - 09/16 0700 In: 110 [IV Piggyback:110] Out: -  Intake/Output this shift: No intake/output data recorded.  PE: Gen: NAD Heart: regular Lungs: CTAB Abd: soft, NT, minimal distention  Lab Results:  Recent Labs    09/29/20 0505 09/30/20 0444  WBC 7.3 7.6  HGB 8.1* 8.2*  HCT 24.5* 24.4*  PLT 224 238   BMET Recent Labs    09/29/20 0505 09/30/20 0444  NA 130* 133*  K 3.4* 3.3*  CL 101 104  CO2 20* 21*  GLUCOSE 112* 106*  BUN <5* <5*  CREATININE 0.96 0.80  CALCIUM 8.0* 8.2*   PT/INR No results for input(s): LABPROT, INR in the last 72 hours. CMP     Component Value Date/Time   NA 133 (L) 09/30/2020 0444   K 3.3 (L) 09/30/2020 0444   CL 104 09/30/2020 0444   CO2 21 (L) 09/30/2020 0444   GLUCOSE 106 (H) 09/30/2020 0444   BUN <5 (L) 09/30/2020 0444   CREATININE 0.80 09/30/2020 0444   CREATININE 1.01 09/15/2020 1415   CALCIUM 8.2 (L) 09/30/2020 0444   PROT 6.3 (L) 09/25/2020 1304   ALBUMIN 3.1 (L) 09/25/2020 1304   AST 20 09/25/2020 1304   AST 13 (L) 09/15/2020 1415   ALT 26 09/25/2020 1304   ALT 11 09/15/2020 1415   ALKPHOS 67 09/25/2020 1304   BILITOT 0.9 09/25/2020 1304   BILITOT 0.7 09/15/2020 1415   GFRNONAA >60 09/30/2020 0444   GFRNONAA >60 09/15/2020 1415   GFRAA >60 09/17/2018 0607   Lipase     Component Value Date/Time   LIPASE 36 09/14/2018 1838       Studies/Results: No results found.  Anti-infectives: Anti-infectives (From  admission, onward)    Start     Dose/Rate Route Frequency Ordered Stop   09/30/20 0900  cefoTEtan (CEFOTAN) 2 g in sodium chloride 0.9 % 100 mL IVPB        2 g 200 mL/hr over 30 Minutes Intravenous On call to O.R. 09/30/20 0800 10/01/20 0559        Assessment/Plan Obstructing sigmoid colon mass - while mass is unable to be traversed by scope and appears obstructing on CT, pt clinically continues to have some bowel function and is without significant abdominal pain or distention on exam - CEA normal, biopsies from colonoscopy 9/14 with high-grade dysplasia and tubo-villous adenoma.  No definitive malignancy but this is clearly still concerning for primary colon malignancy. -long discussion with the patient today regarding these findings and thoughts on surgery to attempt resection and give him and end colostomy vs if we are unable to resect to bring up a diverting loop colostomy.  The patient would like to continue with treatment for all of his malignancies and resection vs diversion will allow for him to avoid obstruction and perforation.  The procedure has been discussed with the patient along with outcomes that may be home with help from his brother (whom he spoke with yesterday)  or his BIL vs SNF rehab pending how he recovers.  I have provided a graphic and drawing to further explain what the procedure is and expected outcomes. The patient is agreeable to proceed with surgery today. -WOC to mark for colostomy -would like to discuss with Dr. Irene Limbo prior to proceeding.  I'm currently trying to get a contact number to reach him to discuss the patient to make sure he is agreeable and ok with this plan as well.  Have attempted the office but unable to get an answer at this time.  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 Arnoldo Morale   LOS: 3 days    Henreitta Cea , [facility] 09/30/2020, 8:06 AM Please see Amion for pager number during day hours 7:00am-4:30pm or 7:00am -11:30am on weekends

## 2020-09-30 NOTE — Progress Notes (Signed)
OT Cancellation Note  Patient Details Name: Marcus [name] MRN: [id number] DOB: 1945/11/25   Cancelled Treatment:    Reason Eval/Treat Not Completed: Patient at procedure or test/ unavailable  Jermel Artley L Parminder Trapani 09/30/2020, 10:26 AM

## 2020-09-30 NOTE — Consult Note (Addendum)
[place] Nurse requested for preoperative stoma site marking  Discussed surgical procedure and stoma creation with patient.  Explained role of the [place] nurse team.  Provided the patient with educational booklet and provided samples of pouching options.  Answered patient's questions.   Examined patient lying and sitting upright, in order to place the marking in the patient's visual field, away from any creases or abdominal contour issues and within the rectus muscle.  Attempted to mark below the patient's belt line, but this was not possible since a significant crease occurs lower on the abd and should be avoided if possible.   Marked for colostomy in the LLQ  __5__ cm to the left of the umbilicus and [id number] above the umbilicus.  Marked for ileostomy in the RLQ  _5___cm to the right of the umbilicus and  [id number] cm above the umbilicus.  Patient's abdomen cleansed with CHG wipes at site markings, allowed to air dry prior to marking. Pt plans for surgery this morning.   [place] [facility] Nurse team will follow up with patient after surgery on Mon for continued ostomy care and teaching.  Thank-you,  Julien Girt MSN, [place], [place], [place], [place]

## 2020-09-30 NOTE — Brief Op Note (Signed)
09/25/2020 - 09/30/2020  4:58 PM  PATIENT:  Marcus [name]  75 y.o. 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); metastatic prostate cancer, esophageal cancer, intra-abdominal metastatic disease  POST-OPERATIVE DIAGNOSIS:  same  PROCEDURE:  Procedure(s): LAPAROSCOPIC ASSISTED HARTMAN'S PROCEDURE WITH END COLOSTOMY; TAP BLOCK, PLACEMENT OF PEG TUBE (N/A)  SURGEON:  Surgeon(s) and Role:    Greer Pickerel, MD - Primary  PHYSICIAN ASSISTANT: Saverio Danker PA-C  ASSISTANTS: none   ANESTHESIA:   general  EBL:  50 mL   BLOOD ADMINISTERED:none  DRAINS: Penrose drain in the pelvis, Urinary Catheter (Foley), and Gastrostomy Tube   LOCAL MEDICATIONS USED:  MARCAINE    and OTHER exparel   SPECIMEN:  sigmoid colon with stitch distal; additional proximal sigmoid colon  DISPOSITION OF SPECIMEN:  PATHOLOGY  COUNTS:  YES  TOURNIQUET:  * No tourniquets in log *  DICTATION: .Other Dictation: Dictation Number [id number]  PLAN OF CARE:  already inpatient  PATIENT DISPOSITION:  PACU - hemodynamically stable.   Delay start of Pharmacological VTE agent (>24hrs) due to surgical blood loss or risk of bleeding: no  Leighton Ruff. Redmond Pulling, MD, FACS General, Bariatric, & Minimally Invasive Surgery [facility] Surgery, [place]

## 2020-10-01 ENCOUNTER — Encounter (HOSPITAL_COMMUNITY): Payer: Self-pay | Admitting: General Surgery

## 2020-10-01 DIAGNOSIS — K6389 Other specified diseases of intestine: Secondary | ICD-10-CM | POA: Diagnosis not present

## 2020-10-01 DIAGNOSIS — R531 Weakness: Secondary | ICD-10-CM | POA: Diagnosis not present

## 2020-10-01 DIAGNOSIS — Z515 Encounter for palliative care: Secondary | ICD-10-CM

## 2020-10-01 DIAGNOSIS — Z7189 Other specified counseling: Secondary | ICD-10-CM | POA: Diagnosis not present

## 2020-10-01 LAB — CBC
HCT: 25.5 % — ABNORMAL LOW (ref 39.0–52.0)
Hemoglobin: 8.3 g/dL — ABNORMAL LOW (ref 13.0–17.0)
MCH: 24.4 pg — ABNORMAL LOW (ref 26.0–34.0)
MCHC: 32.5 g/dL (ref 30.0–36.0)
MCV: 75 fL — ABNORMAL LOW (ref 80.0–100.0)
Platelets: 250 10*3/uL (ref 150–400)
RBC: 3.4 MIL/uL — ABNORMAL LOW (ref 4.22–5.81)
RDW: 17.7 % — ABNORMAL HIGH (ref 11.5–15.5)
WBC: 10.6 10*3/uL — ABNORMAL HIGH (ref 4.0–10.5)
nRBC: 0 % (ref 0.0–0.2)

## 2020-10-01 LAB — BASIC METABOLIC PANEL
Anion gap: 8 (ref 5–15)
BUN: <5 mg/dL — ABNORMAL LOW (ref 8–23)
CO2: 22 mmol/L (ref 22–32)
Calcium: 8.1 mg/dL — ABNORMAL LOW (ref 8.9–10.3)
Chloride: 101 mmol/L (ref 98–111)
Creatinine, Ser: 0.84 mg/dL (ref 0.61–1.24)
GFR, Estimated: >60 mL/min (ref >60)
Glucose, Bld: 169 mg/dL — ABNORMAL HIGH (ref 70–99)
Potassium: 3.9 mmol/L (ref 3.5–5.1)
Sodium: 131 mmol/L — ABNORMAL LOW (ref 135–145)

## 2020-10-01 LAB — MAGNESIUM: Magnesium: 2 mg/dL (ref 1.7–2.4)

## 2020-10-01 MED ORDER — CHLORHEXIDINE GLUCONATE CLOTH 2 % EX PADS
6.0000 | MEDICATED_PAD | Freq: Every day | CUTANEOUS | Status: DC
  Administered 2020-10-01 – 2020-10-11 (×11): 6 via TOPICAL

## 2020-10-01 NOTE — Progress Notes (Signed)
Progress Note    Marcus [name]  [id number] DOB: [birth year]  DOA: 09/25/2020 PCP: [name], MD    Brief Narrative:    Medical records reviewed and are as summarized below:  Marcus [name] is an 75 y.o. 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 Arnoldo Morale on 9/4 and anemia returning with upper back pain, decreased appetite and black stool.  EGD 9/12- mass biopsied.  CT scan of abdomen also shows possible mass.  Oncology consult.    Assessment/Plan:   Active Problems:   Prostate cancer (HCC)   Hyponatremia   GI bleed   Esophageal cancer (HCC)   Esophageal mass   Colonic mass   Goals of care, counseling/discussion   Symptomatic microcytic anemia 2/2 GI bleed, Iron def and Vit B12 def -EGD 9/12- mass biopsied: Poorly differentiated carcinoma -CT scan of abdomen also shows mass- colonoscopy today found sigmoid obstructing mass --Hgb 8's -s/p IM Vit B12 x 3 days Plan: --Monitor Hgb and transfuse PRN --cont IV iron day 3 of 3  Sigmoid obstructing mass S/p Hartman's procedure with end colostomy on 9/16 --advance to full liquid diet, per [place]  Esophageal mass  S/p PEG tube placement on 9/16 --between 25 to 30 cm compatible with poorly differentiated carcinoma, immunophenotype consistent with squamous cell carcinoma. Plan: --advance to full liquid diet, per [place]  Postsurgical back pain -from recent thoracic spine surgery.  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 Arnoldo Morale on 9/4.  Surgical wound appears clean.  No focal neuro symptoms or bowel or bladder habit change. Plan: --oxycodone PRN -Outpatient follow-up with neurosurgery -PT/OT eval- home health   current, metastatic prostate  cancer History of prostate cancer with osseous mets s/p radical prostatectomy and radiation in 2015.   --confirmed with biopsy from laminectomy Plan: --further eval and treatment per oncology   Pyuria/bacteriuria -patient denies UTI symptoms, fever, chills, suprapubic pain or lower back pain. -No indication for treatment.  Hypokalemia --monitor and replete PRN  Hyponatremia, improved --monitor with daily labs  Vitamin D def -will need replacement after procedures   DVT prophylaxis: scd Code Status: Full Code.  Disposition Plan: Status is: inpt   Dispo: The patient is from: Home              Anticipated d/c is to: Home              Patient currently is not medically stable to d/c.  No bowel function yet.    Difficult to place patient No    Medical Consultants:   GI oncology  Subjective:   Pt reported no pain after taking pain meds.  Tolerating full liquid diet.   Objective:    Vitals:   09/30/20 1841 09/30/20 2029 10/01/20 0451 10/01/20 1219  BP: 120/61 (!) 164/73 (!) 163/78 130/70  Pulse: 66 76 73 60  Resp: '14 16 20 18  '$ Temp: 98.3 F (36.8 C) 98.5 F (36.9 C) 98.8 F (37.1 C) 97.7 F (36.5 C)  TempSrc:   Oral Oral  SpO2: 96% 97% 96% 100%  Weight:      Height:        Intake/Output Summary (Last 24 hours) at 10/01/2020 1756 Last data filed at 10/01/2020 1524 Gross per 24 hour  Intake 1125.96 ml  Output 1025 ml  Net 100.96 ml   Filed Weights   09/25/20 0617 09/26/20 1245 09/28/20 0800  Weight: 70.7 kg 74.8 kg 74.8 kg    Exam:  Constitutional: NAD, AAOx3 HEENT: conjunctivae and lids normal, EOMI CV: No cyanosis.   RESP: normal respiratory effort, on RA GI: colostomy bag present, drain outputting small amount of serosanguinous liquid, PEG tube present Extremities: No effusions, edema in BLE SKIN: warm, dry Neuro: II - XII grossly intact.   Psych: Normal mood and affect.  Appropriate judgement and reason     Data Reviewed:   I have  personally reviewed following labs and imaging studies:  Labs: Labs show the following:   Basic Metabolic Panel: Recent Labs  Lab 09/27/20 0414 09/28/20 0441 09/29/20 0505 09/30/20 0444 10/01/20 0539  NA 128* 132* 130* 133* 131*  K 3.5 3.2* 3.4* 3.3* 3.9  CL 98 102 101 104 101  CO2 21* 22 20* 21* 22  GLUCOSE 107* 122* 112* 106* 169*  BUN 7* 6* <5* <5* <5*  CREATININE 0.78 0.98 0.96 0.80 0.84  CALCIUM 8.0* 8.0* 8.0* 8.2* 8.1*  MG  --   --  1.9 1.9 2.0   GFR Estimated Creatinine Clearance: 82.1 mL/min (by C-G formula based on SCr of 0.84 mg/dL). Liver Function Tests: Recent Labs  Lab 09/25/20 1304  AST 20  ALT 26  ALKPHOS 67  BILITOT 0.9  PROT 6.3*  ALBUMIN 3.1*   No results for input(s): LIPASE, AMYLASE in the last 168 hours. No results for input(s): AMMONIA in the last 168 hours. Coagulation profile Recent Labs  Lab 09/25/20 1554  INR 1.1    CBC: Recent Labs  Lab 09/25/20 1304 09/25/20 1554 09/26/20 0641 09/27/20 0414 09/29/20 0505 09/30/20 0444 10/01/20 0539  WBC 8.2  --  7.2 6.7 7.3 7.6 10.6*  NEUTROABS 6.4  --   --   --   --   --   --   HGB 9.9*   < > 8.2* 8.4* 8.1* 8.2* 8.3*  HCT 31.4*   < > 25.1* 25.5* 24.5* 24.4* 25.5*  MCV 80.5  --  76.5* 76.6* 76.1* 75.1* 75.0*  PLT 187  --  199 221 224 238 250   < > = values in this interval not displayed.   Cardiac Enzymes: No results for input(s): CKTOTAL, CKMB, CKMBINDEX, TROPONINI in the last 168 hours. BNP (last 3 results) No results for input(s): PROBNP in the last 8760 hours. CBG: Recent Labs  Lab 09/30/20 2032  GLUCAP 138*   D-Dimer: No results for input(s): DDIMER in the last 72 hours. Hgb A1c: No results for input(s): HGBA1C in the last 72 hours. Lipid Profile: No results for input(s): CHOL, HDL, LDLCALC, TRIG, CHOLHDL, LDLDIRECT in the last 72 hours. Thyroid function studies: No results for input(s): TSH, T4TOTAL, T3FREE, THYROIDAB in the last 72 hours.  Invalid input(s):  FREET3 Anemia work up: No results for input(s): VITAMINB12, FOLATE, FERRITIN, TIBC, IRON, RETICCTPCT in the last 72 hours.  Sepsis Labs: Recent Labs  Lab 09/27/20 0414 09/29/20 0505 09/30/20 0444 10/01/20 0539  WBC 6.7 7.3 7.6 10.6*    Microbiology No results found for this or any previous visit (from the past 240 hour(s)).   Procedures and diagnostic studies:  No results found.  Medications:    acetaminophen  1,000 mg Oral Q6H   Chlorhexidine Gluconate Cloth  6 each Topical Daily   enoxaparin (LOVENOX) injection  40 mg Subcutaneous Q24H   feeding supplement  1  Container Oral TID BM   pantoprazole  40 mg Oral BID   potassium chloride  40 mEq Oral Once   vitamin B-12  500 mcg Oral Daily   Continuous Infusions:  dextrose 5 % and 0.45 % NaCl with KCl 20 mEq/L 50 mL/hr at 10/01/20 1524   iron sucrose 200 mg (10/01/20 1025)     LOS: 4 days   Enzo Bi  [facility]   10/01/2020, 5:56 PM

## 2020-10-01 NOTE — Progress Notes (Signed)
Physical Therapy Treatment Patient Details Name: Marcus [name] MRN: [id number] DOB: [birth year] Today's Date: 10/01/2020   History of Present Illness 75 yo male admitted with GI bleed, anemia. Hx of prostate ca, R THA, S/P colostomy 09/30/20    PT Comments    The patient now S/P colostomy, unable to wear TLSO. Reviewed back precautions and mobility. Patient reports colostomy site painful. Patient medicated during session.  Patient did ambulate 76 ' using RW.Continue to progress mobility as tolerated.  Recommendations for follow up therapy are one component of a multi-disciplinary discharge planning process, led by the attending physician.  Recommendations may be updated based on patient status, additional functional criteria and insurance authorization.  Follow Up Recommendations  SNF vs HH, now  will have to manage a colostomy.     Equipment Recommendations  None recommended by PT    Recommendations for Other Services       Precautions / Restrictions Precautions Precautions: Fall;Back;Other (comment) Precaution Comments: now has colostomy, cannot wear brace, L JP drain Required Braces or Orthoses: Spinal Brace Spinal Brace: Thoracolumbosacral orthotic;Applied in sitting position Restrictions Weight Bearing Restrictions: No     Mobility  Bed Mobility Overal bed mobility: Needs Assistance Bed Mobility: Supine to Sit Rolling: Min guard Sidelying to sit: Mod assist       General bed mobility comments: patient needed multimodal cues for rolling with proper hand and foot placement education with physical assist for trunk into upright positioning.    Transfers Overall transfer level: Needs assistance Equipment used: Rolling walker (2 wheeled) Transfers: Sit to/from Stand Sit to Stand: Min assist         General transfer comment: assist to power up to stand.  Ambulation/Gait Ambulation/Gait assistance: Min assist;+2 safety/equipment Gait Distance (Feet): 45  Feet Assistive device: Rolling walker (2 wheeled) Gait Pattern/deviations: Step-through pattern;Trunk flexed Gait velocity: Decreased   General Gait Details: initially more forward flexed  upper body, improved with distance   Stairs             Wheelchair Mobility    Modified Rankin (Stroke Patients Only)       Balance Overall balance assessment: Needs assistance Sitting-balance support: Feet supported;Bilateral upper extremity supported Sitting balance-Leahy Scale: Fair Sitting balance - Comments: Able to maintain sitting balance with support, guarding   Standing balance support: Bilateral upper extremity supported;During functional activity Standing balance-Leahy Scale: Fair Standing balance comment: use of RW for support                            Cognition Arousal/Alertness: Awake/alert Behavior During Therapy: WFL for tasks assessed/performed Overall Cognitive Status: Within Functional Limits for tasks assessed Area of Impairment: Memory;Safety/judgement;Problem solving                     Memory: Decreased short-term memory;Decreased recall of precautions   Safety/Judgement: Decreased awareness of safety   Problem Solving: Slow processing General Comments: therapist reviewed back precautions, patient unable to recall later in session      Exercises      General Comments        Pertinent Vitals/Pain Pain Score: 8  Pain Location: colostomy site and middle of back Pain Descriptors / Indicators: Discomfort;Grimacing;Sore Pain Intervention(s): Monitored during session;RN gave pain meds during session    Home Living                      Prior Function  PT Goals (current goals can now be found in the care plan section) Acute Rehab PT Goals Patient Stated Goal: return home and safely perform ADLs and mobility Progress towards PT goals: Progressing toward goals    Frequency    Min 3X/week      PT Plan  Discharge plan needs to be updated    Co-evaluation PT/OT/SLP Co-Evaluation/Treatment: Yes Reason for Co-Treatment: For patient/therapist safety PT goals addressed during session: Mobility/safety with mobility OT goals addressed during session: ADL's and self-care      AM-PAC PT "6 Clicks" Mobility   Outcome Measure  Help needed turning from your back to your side while in a flat bed without using bedrails?: A Lot Help needed moving from lying on your back to sitting on the side of a flat bed without using bedrails?: A Lot Help needed moving to and from a bed to a chair (including a wheelchair)?: A Little Help needed standing up from a chair using your arms (e.g., wheelchair or bedside chair)?: A Little Help needed to walk in hospital room?: A Little Help needed climbing 3-5 steps with a railing? : A Little 6 Click Score: 16    End of Session Equipment Utilized During Treatment: Gait belt Activity Tolerance: Patient tolerated treatment well Patient left: in chair;with call bell/phone within reach;with chair alarm set Nurse Communication: Mobility status PT Visit Diagnosis: Unsteadiness on feet (R26.81);Muscle weakness (generalized) (M62.81);Difficulty in walking, not elsewhere classified (R26.2);Pain     Time: [id number] PT Time Calculation (min) (ACUTE ONLY): 32 min  Charges:  $Gait Training: 8-22 mins                     Tresa Endo PT Acute Rehabilitation Services Pager [phone] Office [phone]    Claretha Cooper 10/01/2020, 12:55 PM

## 2020-10-01 NOTE — Progress Notes (Signed)
Occupational Therapy Treatment Patient Details Name: Marcus [name] MRN: [id number] DOB: [birth year] Today's Date: 10/01/2020   History of present illness 75 yo male admitted with GI bleed, anemia. Hx of prostate ca, R THA, S/P colostomy 09/30/20   OT comments  Patient was noted to have increased dizziness with activity on this date. Patient was noted to require increased assistance for bed mobility with continued re education on back precautions and how to maintain them. Patient will need 24/7 caregiver support and someone to assist with colostomy in next level of care. Patient would continue to benefit from skilled OT services at this time while admitted and after d/c to address noted deficits in order to improve overall safety and independence in ADLs.   Blood pressures :  Supine: 169/73 mmhg Sitting: 158/82 mmhg After functional mobility: 148/76 mmhg with patient reporting dizziness. Nurse made aware.    Recommendations for follow up therapy are one component of a multi-disciplinary discharge planning process, led by the attending physician.  Recommendations may be updated based on patient status, additional functional criteria and insurance authorization.    Follow Up Recommendations  Home health OT;Supervision/Assistance - 24 hour    Equipment Recommendations  None recommended by OT    Recommendations for Other Services      Precautions / Restrictions Precautions Precautions: Fall;Back;Other (comment) Precaution Comments: now has colostomy, cannot wear brace, L JP Required Braces or Orthoses: Spinal Brace Spinal Brace: Thoracolumbosacral orthotic;Applied in sitting position Restrictions Weight Bearing Restrictions: No       Mobility Bed Mobility Overal bed mobility: Needs Assistance Bed Mobility: Supine to Sit Rolling: Min guard Sidelying to sit: Mod assist       General bed mobility comments: patient needed multimodal cues for rolling with proper hand and foot  placement education with physical assist for trunk into upright positioning.    Transfers Overall transfer level: Needs assistance Equipment used: Rolling walker (2 wheeled) Transfers: Sit to/from Stand Sit to Stand: Min assist         General transfer comment: assist to power up to stand.    Balance Overall balance assessment: Needs assistance Sitting-balance support: Feet supported;Bilateral upper extremity supported Sitting balance-Leahy Scale: Fair Sitting balance - Comments: Able to maintain sitting balance with support, guarding   Standing balance support: Bilateral upper extremity supported;During functional activity Standing balance-Leahy Scale: Fair Standing balance comment: use of RW for support                           ADL either performed or assessed with clinical judgement   ADL Overall ADL's : Needs assistance/impaired                                       General ADL Comments: Patient was unable to remember back precautions. patient was educated on 3/3 back precautions with patient unable to recall them later in session. patient unable to wear back brace at this time with new colostomy and JP drain in place. patient was max A for LB dressing to adjust gripper socks.     Vision Baseline Vision/History: 1 Wears glasses Ability to See in Adequate Light: 1 Impaired Patient Visual Report: No change from baseline     Perception     Praxis      Cognition Arousal/Alertness: Awake/alert Behavior During Therapy: WFL for tasks assessed/performed Overall Cognitive Status: Within  Functional Limits for tasks assessed Area of Impairment: Memory;Safety/judgement;Problem solving                     Memory: Decreased short-term memory;Decreased recall of precautions   Safety/Judgement: Decreased awareness of safety   Problem Solving: Slow processing General Comments: therapist reviewed back precautions, patient unable to recall  later in session        Exercises     Shoulder Instructions       General Comments      Pertinent Vitals/ Pain       Pain Score: 8  Pain Location: colostomy site and middle of back Pain Descriptors / Indicators: Discomfort;Grimacing;Sore Pain Intervention(s): Limited activity within patient's tolerance;Monitored during session;RN gave pain meds during session  Home Living                                          Prior Functioning/Environment              Frequency  Min 2X/week        Progress Toward Goals  OT Goals(current goals can now be found in the care plan section)  Progress towards OT goals: Progressing toward goals  Acute Rehab OT Goals Patient Stated Goal: return home and safely perform ADLs and mobility  Plan Frequency remains appropriate;Discharge plan needs to be updated    Co-evaluation    PT/OT/SLP Co-Evaluation/Treatment: Yes Reason for Co-Treatment: For patient/therapist safety PT goals addressed during session: Mobility/safety with mobility OT goals addressed during session: ADL's and self-care      AM-PAC OT "6 Clicks" Daily Activity     Outcome Measure   Help from another person eating meals?: None Help from another person taking care of personal grooming?: A Little Help from another person toileting, which includes using toliet, bedpan, or urinal?: A Little Help from another person bathing (including washing, rinsing, drying)?: A Little Help from another person to put on and taking off regular upper body clothing?: A Little Help from another person to put on and taking off regular lower body clothing?: A Lot 6 Click Score: 18    End of Session Equipment Utilized During Treatment: Rolling walker  OT Visit Diagnosis: Unsteadiness on feet (R26.81);Other abnormalities of gait and mobility (R26.89);Muscle weakness (generalized) (M62.81);Pain Pain - Right/Left: Left   Activity Tolerance Patient tolerated treatment  well   Patient Left in chair;with call bell/phone within reach;with chair alarm set (nurse in room)   Nurse Communication Other (comment) (nurse cleared patient to participate)        Time: 0950-1020 OT Time Calculation (min): 30 min  Charges: OT General Charges $OT Visit: 1 Visit OT Treatments $Self Care/Home Management : 8-22 mins  Jackelyn Poling OTR/L, [facility] Department Office# [phone] Pager# [phone]   [place] 10/01/2020, 11:39 AM

## 2020-10-01 NOTE — Progress Notes (Signed)
1 Day Post-Op hand assisted lap hartman's, PEG placement Subjective: No complaints this AM.  Hasn't been out of bed yet. No nausea  Objective: Vital signs in last 24 hours: Temp:  [97.9 F (36.6 C)-98.8 F (37.1 C)] 98.8 F (37.1 C) (09/17 0451) Pulse Rate:  [66-84] 73 (09/17 0451) Resp:  [14-20] 20 (09/17 0451) BP: (120-164)/(60-78) 163/78 (09/17 0451) SpO2:  [96 %-98 %] 96 % (09/17 0451)   Intake/Output from previous day: 09/16 0701 - 09/17 0700 In: 1702.3 [I.V.:1702.3] Out: 1000 [Urine:900; Drains:50; Blood:50] Intake/Output this shift: No intake/output data recorded.   General appearance: alert and cooperative GI: soft, nondistended, ostomy pink and edematous, G tube in place to gravity drain  Incision: no significant drainage  Lab Results:  Recent Labs    09/30/20 0444 10/01/20 0539  WBC 7.6 10.6*  HGB 8.2* 8.3*  HCT 24.4* 25.5*  PLT 238 250   BMET Recent Labs    09/30/20 0444 10/01/20 0539  NA 133* 131*  K 3.3* 3.9  CL 104 101  CO2 21* 22  GLUCOSE 106* 169*  BUN <5* <5*  CREATININE 0.80 0.84  CALCIUM 8.2* 8.1*   PT/INR No results for input(s): LABPROT, INR in the last 72 hours. ABG No results for input(s): PHART, HCO3 in the last 72 hours.  Invalid input(s): PCO2, PO2  MEDS, Scheduled  acetaminophen  1,000 mg Oral Q6H   Chlorhexidine Gluconate Cloth  6 each Topical Daily   enoxaparin (LOVENOX) injection  40 mg Subcutaneous Q24H   feeding supplement  1 Container Oral TID BM   pantoprazole  40 mg Oral BID   potassium chloride  40 mEq Oral Once   vitamin B-12  500 mcg Oral Daily    Studies/Results: No results found.  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

## 2020-10-01 NOTE — Op Note (Signed)
NAME: Marcus [name], Marcus [name] MEDICAL RECORD NO: [id number] ACCOUNT NO: [id number] DATE OF BIRTH: 11/15/1945 FACILITY: [facility] LOCATION: [id number] PHYSICIAN: Leighton Ruff. Redmond Pulling, MD  Operative Report   DATE OF PROCEDURE: 09/30/2020  PREOPERATIVE DIAGNOSES:   1.  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 2.  Metastatic prostate cancer. 3.  Esophageal cancer. 4.  Intraabdominal metastatic disease.  POSTOPERATIVE DIAGNOSES: 1.  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 2.  Metastatic prostate cancer. 3.  Esophageal cancer. 4.  Intraabdominal metastatic disease.  PROCEDURE:   1.  Laparoscopic-assisted Hartmann's procedure with end colostomy. 2.  TAP block. 3.  Placement of 20-French PEG tube.  SURGEON:  Leighton Ruff. Redmond Pulling, MD  ASSISTANT:  Saverio Danker, PA-C  ANESTHESIA:  General.  ESTIMATED BLOOD LOSS:  50.  DRAINS:  Penrose drain in the pelvis, Foley catheter, 20-French G-tube secured at 2.5 cm mark at the skin.  SPECIMENS: 1.  Sigmoid colon with stitch marking distal margin. 2.  Additional proximal sigmoid colon.  INDICATIONS FOR PROCEDURE:  The patient is a 75 year old gentleman who has metastatic prostate cancer to his spine.  He was admitted a few days ago with dizziness and concern of blood in his stool.  He continued to have abnormal bowel movements and he  underwent upper endoscopy, which showed a nonobstructing esophageal mass in the middle third of the esophagus, unfortunately that came back as adenocarcinoma.  He underwent a CT scan for staging and was found to have a circumferential apple core lesion  in the distal sigmoid colon with partial colonic obstruction.  He underwent flexible sigmoidoscopy and they were not able to traverse the lesion at 15 cm from the anus.  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  Because he had  some proximal colonic distention and the scope could not  transverse it, it was felt that he had an impending complete bowel obstruction, which would make adjuvant therapy for his other cancers difficult, so the recommendation was to proceed with  resection of the obstructing mass with end colostomy.  I did not think he was a candidate for an anastomosis during this hospitalization given the fact that he has got metastatic cancer to his spine, liver and upper abdominal cavity.  This resection was  slowly done to prevent a complete bowel blockage.  Because he also had esophageal cancer and would need treatment for that, there was concern that he would have difficulty with eating, so therefore Oncology recommended placing a G-tube as well today.  We  had a long discussion with the patient regarding risks and benefits of the surgery, which were separately documented.  DESCRIPTION OF PROCEDURE:  The patient received subcutaneous Lovenox this morning.  He was brought to operating room 4 at [facility] and placed supine on the operating room table.  General endotracheal anesthesia was established.  His right arm  was tucked at his side with the appropriate padding.  His abdomen was prepped and draped in the usual standard surgical fashion with ChloraPrep.  He had been marked preoperatively by the wound ostomy nurse.  His abdomen was prepped and draped in the  usual standard surgical fashion.  A surgical timeout was performed.  He received IV antibiotic.  I elected to gain access to his abdomen using the Optiview technique in the right upper quadrant.  A small incision was made just below subcostal margin in the midclavicular line.  Then, using a 0-degree, 5 mm laparoscope through a 5 mm  trocar, I  advanced it through all layers of the abdominal wall into the abdominal cavity.  Pneumoperitoneum was smoothly established up to a pressure of 15 mmHg without any changes in the patient's vital signs.  Laparoscope was advanced and the abdominal cavity  was surveyed.   There was no evidence of injury to surrounding viscera.  He had dilated colon starting essentially at what appeared to be the splenic flexure all the way down into the pelvis.  He had a very redundant sigmoid colon.  There was no gross  evidence of carcinomatosis.  Additional trocars were placed in the left lower quadrant and in the infraumbilical position.  Bilateral laparoscopic TAP block was performed with a combination of Marcaine and Exparel for postoperative pain relief.  The  patient was placed in Trendelenburg.  I was able to grab the distal sigmoid colon as it went into the pelvis.  There was a little bit of adhesion to the bladder, but I was able to free this up bluntly, then I was able to lift up the colon.  There was an  obvious mass in the distal sigmoid upper rectum.  It was quite firm.  The upstream colon was dilated as described.  I decided to start taking down the peritoneum on either side of the sigmoid colon as it entered the pelvic inlet staying close to the  lumen of the bowel.  This was done with Harmonic scalpel.  I scored it on each side down into the pelvis.  Because there was so much redundant colon, I decided that I would just make a lower midline incision.  I extended the incision from the  infraumbilical trocar down to the pubis.  Subcutaneous tissue was divided with electrocautery.  Fascia was divided and the abdominal cavity was entered.  A wound protector was placed.  I decided to transect the colon a few centimeters proximal to the  mass.  I could visualize the tattoo from GI.  A small window was made in the mesentery just next to the bowel wall.  The colon was then divided with a contour stapler with a green load.  I then started taking down some of the distal mesentery just  staying very close to the bowel wall toward the mass with LigaSure.  I was able to get around the mass distally for about a centimeter.  I was able to skeletonize the colon just below the lesion with a  combination of Harmonic scalpel, Bovie  electrocautery, all this was done with the aid of the right angle.  I was able to get a window just below the lesion distally to the mass.  Another fire of a contour stapler with a green load was performed.  There was one area of bleeding in the  mesorectum, which was taken care of with a silk suture.  Since the proximal colon was so redundant, I decided to resect some of the proximal colon in order to reach the area where the patient had been marked, which was slightly above the umbilicus and to  the left.  This was taken down with LigaSure and the mesentery just next to the bowel wall.  I found an area to transect the bowel wall with a contour stapler with the blue load.  A circular skin defect was then made with electrocautery where the  patient had been marked.  Subcutaneous tissue was divided.  The fascia was incised in a cruciate fashion.  Rectus was spread with a Claiborne Billings and  the posterior fascia was incised.  I was able to pass two fingers through it.  I was able to bring out the  proximal descending colon up through the abdominal wall, ensuring it was untwisted.  It had adequate length and it was not under tension.  A cap was placed on the wound protector.  At this point, the PA jointed me in the operating room.  I scrubbed out.   We obtained the Olympus endoscope and placed it in the patient's oropharynx and we guided it down the esophagus into the stomach.  I was able to insufflate the stomach.  My PA had scrubbed in and was able to place a finger in the left upper quadrant  below the left subcostal margin and I could see her impulse with her finger onto the stomach wall.  We also insufflated the abdomen and returned laparoscopically and confirmed that there was no colon between the stomach and the abdominal wall.  We could  see the light within the stomach along the greater curvature of the stomach.  We desufflated the abdomen.  She then took the syringe with  needle and punctured the skin just to the left of the midline about two fingerbreadths below the left subcostal  margin. I could visualize it coming into the stomach.  She advanced the guidewire through the sheath into the gastric cavity.  I then placed a snare through the endoscope and advanced it through the endoscope down into the stomach.  I was able to lasso  the guidewire with the snare.  We then brought out the endoscope with the guidewire out of the oropharynx.  I then connected the 20-French PEG tube to the guidewire.  She started pulling on the guidewire out through the abdominal wall.  I carefully made  sure that the PEG tube button was in the oropharynx and passed the tongue without injuring the patient's teeth.  As she pulled on the guidewire and started pulling the PEG tube down the patient's esophagus, I advanced the endoscope and followed.  She was  able to exteriorize the PEG tube and bring it out.  I was able to visualize the PEG tube button within the stomach.  It easily twisted.  She secured the tube at the 2.5 cm mark.  A flange was placed on the outside and the PEG was secured in typical  fashion.  We returned laparoscopically.  Confirmed that the G-tube was in the stomach and there was nothing between the gastric wall and the anterior abdominal wall.  I also confirmed that my descending colostomy was not twisted.  At this point,  pneumoperitoneum was released.  The wound protector was removed.  I closed the lower midline incision with a running non-looped PDS, one from above and one from below and tied centrally.  The subcutaneous tissue was irrigated and the skin was closed with  skin staples.  The right upper quadrant trocar site was closed with a skin staple.  It should be noted that prior to closing the lower abdominal incision, a 19 round Blake drain was placed within the abdominal cavity and brought out through the right  lower quadrant trocar site and secured to the skin with a  2-0 nylon suture.  It was placed above and around the rectal stump.  Honeycomb dressing was placed on the lower midline and sterile dressing and drain sponge on the other sites.  At this point, I  matured the end colostomy.  The staple line was removed  with electrocautery.  I then placed 4 interrupted 3-0 Vicryl sutures in a Brooke fashion superior, inferior, medial and laterally.  I then placed interrupted 3-0 Vicryl sutures between those 4  sutures.  The ostomy was viable, had great perfusion.  An ostomy appliance was applied.  All needle, instrument, and sponge counts were correct x 2.  There were no immediate complications.  The patient tolerated the procedure well.  He was extubated and  taken to the recovery room in stable condition.   VAI D: 09/30/2020 5:17:15 pm T: 10/01/2020 12:47:00 am  JOB: [id number]

## 2020-10-01 NOTE — Consult Note (Signed)
Consultation Note Date: 10/01/2020   Patient Name: Marcus [name]  DOB: September 30, 1945  MRN: [id number]  Age / Sex: 75 y.o., male  PCP: [name], MD Referring Physician: Enzo Bi, MD  Reason for Consultation: Establishing goals of care  HPI/Patient Profile: 75 y.o. 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  He was admitted a few days ago with dizziness and concern of blood in his stool.  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  He underwent a CT scan for staging and was found to have a circumferential apple core lesion in the distal sigmoid colon with partial colonic obstruction.  He underwent flexible sigmoidoscopy and they were not able to traverse the lesion at 15 cm from the anus.  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  Because he had  some proximal colonic distention and the scope could not transverse it, it was felt that he had an impending complete bowel obstruction, which would make adjuvant therapy for his other cancers difficult, so the recommendation was to proceed with resection of the obstructing mass with end colostomy.    Clinical Assessment and Goals of Care:  On 09-30-20, the patient underwent lap assisted hartman's procedure with end colostomy and placement of PEG tube. Today is post op day 1.   A palliative consult has been requested for code status and broad goals of care discussions given the extent of his serious illness diagnosis of multiple primary malignancies as noted above.   I met with the patient, he is resting in bed. He has some episodes of "sharp" pain at times. He has no pain when he is resting in bed  without moving.   I introduced myself and palliative care as follows: Palliative medicine is specialized medical care for people living with serious illness. It focuses on providing relief from the symptoms and stress of a serious illness. The goal is to improve quality of life for both the patient and the family.  Goals of care: Broad aims of medical therapy in relation to the patient's values and preferences. Our aim is to provide medical care aimed at enabling patients to achieve the goals that matter most to them, given the circumstances of their particular medical situation and their constraints.   Brief life review performed: Patient has lived in [place] all of his life, he used to work for [employer], he drove a garbage truck for years. He lives alone. He has not made any advance directives so far.   Goals, wishes and values important to the patient attempted to be explored. He is aware of his current diagnoses: metastatic prostate cancer, esophageal cancer, colon mass. He states," I got no one else to blame but myself. I thought I was done with cancer. I did  not go back to them."   We talked about his current condition in detail. A life review was also done. Code status discussions were also undertaken. Patient wants full code. He understands CPR and ACLS protocol. He wants a resuscitation attempt at end of life. He understands it may not be successful and it will not cure his cancer. At this time, his wishes are for full code measures to continue. He is hopeful and will accept any cancer directed treatments that he will be offered. He will likely need to go to SNF rehab with palliative on discharge. See below.   NEXT OF KIN  States that he has a brother in [place], brother in [place] and daughter who live in [place]. He has not made any advance directives yet. He has 3 daughters, he also has an ex wife.   SUMMARY OF RECOMMENDATIONS    Full Code, Full Scope care Continue  current mode of care Chaplain consult for advance directives: patient to decide about who he would choose as his HCPOA. He has his brother who lives in [place], one of his daughters lives locally and so does his brother in [place].  Recommend SNF rehab with palliative care on discharge.  Thank you for the consult.   Code Status/Advance Care Planning: Full code   Symptom Management:     Palliative Prophylaxis:  Delirium Protocol  Additional Recommendations (Limitations, Scope, Preferences): Full Scope Treatment  Psycho-social/Spiritual:  Desire for further Chaplaincy support:yes Additional Recommendations: Caregiving  Support/Resources  Prognosis:  Unable to determine  Discharge Planning: [facility] for rehab with Palliative care service follow-up      Primary Diagnoses: Present on Admission:  GI bleed  Esophageal cancer ([facility])  Hyponatremia  Prostate cancer ([facility])   I have reviewed the medical record, interviewed the patient and family, and examined the patient. The following aspects are pertinent.  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    Prostate cancer ([facility]) 09/04/13   Adenocarcinoma   Social History   Socioeconomic History   Marital status: Legally Separated    Spouse name: Not on file   Number of children: Not on file   Years of education: Not on file   Highest education level: Not on file  Occupational History   Occupation: retired  Tobacco Use   Smoking status: Every Day    Packs/day: 0.25    Types: Cigarettes   Smokeless tobacco: Never  Vaping Use   Vaping Use: Never used  Substance and Sexual Activity   Alcohol use: Not Currently    Comment: 2- 40 ounce beers per week   Drug use: No   Sexual activity: Not Currently  Other Topics Concern   Not on file  Social  History Narrative   Not on file   Social Determinants of Health   Financial Resource Strain: Not on file  Food Insecurity: Not on file  Transportation Needs: Not on file  Physical Activity: Not on file  Stress: Not on file  Social Connections: Not on file   Family History  Problem Relation Age of Onset   Seizures Mother    Cancer Father        throat cancer   Scheduled Meds:  acetaminophen  1,000 mg Oral Q6H  Chlorhexidine Gluconate Cloth  6 each Topical Daily   enoxaparin (LOVENOX) injection  40 mg Subcutaneous Q24H   feeding supplement  1 Container Oral TID BM   pantoprazole  40 mg Oral BID   potassium chloride  40 mEq Oral Once   vitamin B-12  500 mcg Oral Daily   Continuous Infusions:  dextrose 5 % and 0.45 % NaCl with KCl 20 mEq/L 50 mL/hr at 09/30/20 1857   iron sucrose 200 mg (10/01/20 1025)   PRN Meds:.ondansetron **OR** ondansetron (ZOFRAN) IV, oxyCODONE Medications Prior to Admission:  Prior to Admission medications   Medication Sig Start Date End Date Taking? Authorizing Provider  acetaminophen (TYLENOL) 325 MG tablet Take 2 tablets (650 mg total) by mouth every 6 (six) hours as needed for up to 30 doses for mild pain or moderate pain. 08/31/20  Yes Trifan, Carola Rhine, MD  cyclobenzaprine (FLEXERIL) 10 MG tablet Take 1 tablet (10 mg total) by mouth 3 (three) times daily as needed for muscle spasms. 09/22/20  Yes Viona Gilmore D, NP  IBUPROFEN IB PO Take 1 tablet by mouth daily as needed (pain).   Yes [provider]  oxyCODONE 10 MG TABS Take 1 tablet (10 mg total) by mouth every 4 (four) hours as needed for severe pain ((score 7 to 10)). 09/22/20  Yes Viona Gilmore D, NP  amLODipine (NORVASC) 10 MG tablet Take 1 tablet (10 mg total) by mouth daily. Patient not taking: No sig reported 09/17/18 11/16/18  Pokhrel, Corrie Mckusick, MD  docusate sodium (COLACE) 100 MG capsule Take 1 capsule (100 mg total) by mouth 2 (two) times daily. Patient not taking: No sig reported  09/22/20   Viona Gilmore D, NP  ferrous sulfate (FERROUSUL) 325 (65 FE) MG tablet Take 1 tablet (325 mg total) by mouth daily. Patient not taking: No sig reported 09/17/18   Pokhrel, Corrie Mckusick, MD   No Known Allergies Review of Systems +abdominal pain  Physical Exam Awake alert Resting in bed Regular work of breathing S 1 S 2  Abdomen: G tube and ostomy No edema No focal deficits.   Vital Signs: BP 130/70 (BP Location: Right Arm)   Pulse 60   Temp 97.7 F (36.5 C) (Oral)   Resp 18   Ht 5' 10"  (1.778 m)   Wt 74.8 kg   SpO2 100%   BMI 23.66 kg/m  Pain Scale: 0-10   Pain Score: 8    SpO2: SpO2: 100 % O2 Device:SpO2: 100 % O2 Flow Rate: .O2 Flow Rate (L/min): 2 L/min  IO: Intake/output summary:  Intake/Output Summary (Last 24 hours) at 10/01/2020 1446 Last data filed at 10/01/2020 1420 Gross per 24 hour  Intake 1822.25 ml  Output 1350 ml  Net 472.25 ml    LBM: Last BM Date: 09/28/20 Baseline Weight: Weight: 70.7 kg Most recent weight: Weight: 74.8 kg     Palliative Assessment/Data:   PPS 40%  Time In:  1330 Time Out:  1430 Time Total:  60  Greater than 50%  of this time was spent counseling and coordinating care related to the above assessment and plan.  Signed by: Loistine Chance, MD   Please contact Palliative Medicine Team phone at [phone] for questions and concerns.  For individual provider: See Shea Evans

## 2020-10-02 ENCOUNTER — Inpatient Hospital Stay (HOSPITAL_COMMUNITY): Payer: Medicare Other

## 2020-10-02 DIAGNOSIS — K6389 Other specified diseases of intestine: Secondary | ICD-10-CM | POA: Diagnosis not present

## 2020-10-02 LAB — BASIC METABOLIC PANEL
Anion gap: 7 (ref 5–15)
BUN: <5 mg/dL — ABNORMAL LOW (ref 8–23)
CO2: 25 mmol/L (ref 22–32)
Calcium: 8 mg/dL — ABNORMAL LOW (ref 8.9–10.3)
Chloride: 99 mmol/L (ref 98–111)
Creatinine, Ser: 0.8 mg/dL (ref 0.61–1.24)
GFR, Estimated: >60 mL/min (ref >60)
Glucose, Bld: 180 mg/dL — ABNORMAL HIGH (ref 70–99)
Potassium: 3.9 mmol/L (ref 3.5–5.1)
Sodium: 131 mmol/L — ABNORMAL LOW (ref 135–145)

## 2020-10-02 LAB — CBC
HCT: 25.7 % — ABNORMAL LOW (ref 39.0–52.0)
Hemoglobin: 8.6 g/dL — ABNORMAL LOW (ref 13.0–17.0)
MCH: 25.4 pg — ABNORMAL LOW (ref 26.0–34.0)
MCHC: 33.5 g/dL (ref 30.0–36.0)
MCV: 75.8 fL — ABNORMAL LOW (ref 80.0–100.0)
Platelets: 245 10*3/uL (ref 150–400)
RBC: 3.39 MIL/uL — ABNORMAL LOW (ref 4.22–5.81)
RDW: 18.6 % — ABNORMAL HIGH (ref 11.5–15.5)
WBC: 6.2 10*3/uL (ref 4.0–10.5)
nRBC: 0 % (ref 0.0–0.2)

## 2020-10-02 LAB — MAGNESIUM: Magnesium: 2.1 mg/dL (ref 1.7–2.4)

## 2020-10-02 LAB — HEMOGLOBIN A1C
Hgb A1c MFr Bld: 6.5 % — ABNORMAL HIGH (ref 4.8–5.6)
Mean Plasma Glucose: 139.85 mg/dL

## 2020-10-02 MED ORDER — MORPHINE SULFATE (PF) 2 MG/ML IV SOLN
1.0000 mg | INTRAVENOUS | Status: DC | PRN
Start: 2020-10-02 — End: 2020-10-04
  Administered 2020-10-02 – 2020-10-03 (×2): 1 mg via INTRAVENOUS
  Filled 2020-10-02 (×2): qty 1

## 2020-10-02 MED ORDER — KETOROLAC TROMETHAMINE 15 MG/ML IJ SOLN
15.0000 mg | Freq: Four times a day (QID) | INTRAMUSCULAR | Status: AC | PRN
  Administered 2020-10-03 (×2): 15 mg via INTRAVENOUS
  Filled 2020-10-02 (×2): qty 1

## 2020-10-02 NOTE — Progress Notes (Signed)
Per Dr. Enzo Bi, Hook pt to wall suction per PEG tube to pull off some fluid and gas due to ileus that was found on Abdominal x-ray today

## 2020-10-02 NOTE — Progress Notes (Signed)
2 Days Post-Op hand assisted lap hartman's, PEG placement Subjective: Complains of pain with movement.  Some nausea  Objective: Vital signs in last 24 hours: Temp:  [97.7 F (36.5 C)-98.4 F (36.9 C)] 98.4 F (36.9 C) (09/18 0550) Pulse Rate:  [60-68] 64 (09/18 0550) Resp:  [12-20] 20 (09/18 0550) BP: (128-140)/(66-70) 140/68 (09/18 0550) SpO2:  [96 %-100 %] 96 % (09/18 0550)   Intake/Output from previous day: 09/17 0701 - 09/18 0700 In: 623.7 [P.O.:120; I.V.:503.7] Out: 1020 [Urine:950; Drains:70] Intake/Output this shift: No intake/output data recorded.   General appearance: alert and cooperative GI: soft, nondistended, ostomy pink and edematous, G tube in place, clamped Incision: no significant drainage  Lab Results:  Recent Labs    10/01/20 0539 10/02/20 0539  WBC 10.6* 6.2  HGB 8.3* 8.6*  HCT 25.5* 25.7*  PLT 250 245    BMET Recent Labs    10/01/20 0539 10/02/20 0539  NA 131* 131*  K 3.9 3.9  CL 101 99  CO2 22 25  GLUCOSE 169* 180*  BUN <5* <5*  CREATININE 0.84 0.80  CALCIUM 8.1* 8.0*    PT/INR No results for input(s): LABPROT, INR in the last 72 hours. ABG No results for input(s): PHART, HCO3 in the last 72 hours.  Invalid input(s): PCO2, PO2  MEDS, Scheduled  acetaminophen  1,000 mg Oral Q6H   Chlorhexidine Gluconate Cloth  6 each Topical Daily   enoxaparin (LOVENOX) injection  40 mg Subcutaneous Q24H   feeding supplement  1 Container Oral TID BM   pantoprazole  40 mg Oral BID   potassium chloride  40 mEq Oral Once   vitamin B-12  500 mcg Oral Daily    Studies/Results: No results found.  Assessment: s/p Procedure(s): HAND ASSISTED LAPAROSCOPIC HARTMAN'S PROCEDURE WITH END COLOSTOMY; TAP BLOCK, PLACEMENT OF PEG TUBE Patient Active Problem List   Diagnosis Date Noted   Esophageal mass    Colonic mass    Goals of care, counseling/discussion    Esophageal cancer ([facility]) 09/27/2020   GI bleed 09/25/2020   Bone metastases ([facility])     Cord compression [facility])    Thoracic spine tumor 09/17/2020   Hyponatremia 09/14/2018   Hypokalemia 09/14/2018   ARF (acute renal failure) ([facility]) 09/14/2018   Acute lower UTI 09/14/2018   S/P right THA, 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

## 2020-10-02 NOTE — Progress Notes (Signed)
Progress Note    Marcus [name]  [id number] DOB: 15-Jan-1946  DOA: 09/25/2020 PCP: [name], MD    Brief Narrative:    Medical records reviewed and are as summarized below:  Marcus [name] is an 75 y.o. 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 Arnoldo Morale on 9/4 and anemia returning with upper back pain, decreased appetite and black stool.  EGD 9/12- mass biopsied.  CT scan of abdomen also shows possible mass.  Oncology consult.    Assessment/Plan:   Active Problems:   Prostate cancer ([facility])   Hyponatremia   GI bleed   Esophageal cancer ([facility])   Esophageal mass   Colonic mass   Goals of care, counseling/discussion   Post-op ileus --Pt complained of nausea and abdominal pain, declined to have any oral intake. --KUB showed diffuse gaseous distension of large and small bowel suspicious for ileus. Plan: --PEG tube to suction --NPO for now except for ice chips for comfort --anti-emetics  Symptomatic microcytic anemia 2/2 GI bleed, Iron def and Vit B12 def -EGD 9/12- mass biopsied: Poorly differentiated carcinoma -CT scan of abdomen also shows mass- colonoscopy today found sigmoid obstructing mass --Hgb 8's -s/p IM Vit B12 x 3 days, and IV iron x3 days Plan: --Monitor Hgb and transfuse if Hgb <7  Sigmoid obstructing mass S/p Hartman's procedure with end colostomy on 9/16 --currently having post-op ileus --follow colostomy output to monitor return of bowel function  Esophageal mass  S/p PEG tube placement on 9/16 --between 25 to 30 cm compatible with poorly differentiated carcinoma, immunophenotype consistent with squamous cell carcinoma. Plan: --PEG tube to suction today due to ileus  Postsurgical back pain -from recent thoracic spine surgery.  Thoracic spine tumor with  cord compression s/p T5-6 laminectomy with posterior lateral arthrodesis from T4-T8 by Dr. Arnoldo Morale on 9/4.  Surgical wound appears clean.  No focal neuro symptoms or bowel or bladder habit change. Plan: --IV morphine PRN for pain now that pt is NPO -Outpatient follow-up with neurosurgery -PT/OT eval- home health   current, metastatic prostate cancer History of prostate cancer with osseous mets s/p radical prostatectomy and radiation in 2015.   --confirmed with biopsy from laminectomy Plan: --further eval and treatment per oncology   Pyuria/bacteriuria -patient denies UTI symptoms, fever, chills, suprapubic pain or lower back pain. -No indication for treatment.  Hypokalemia --monitor and replete PRN  Hyponatremia, improved --monitor with daily labs  Vitamin D def -will need replacement after procedures   DVT prophylaxis: scd Code Status: Full Code.  Status is: inpt   Dispo: The patient is from: Home              Anticipated d/c is to: undetermined               Patient currently is not medically stable to d/c.  No bowel function yet.  Post-op ileus     Difficult to place patient No    Medical Consultants:   GI oncology  Subjective:   Pt felt unwell today, nauseated, didn't want to take any oral.  Suspected post-op ileus, confirmed by KUB.    Objective:    Vitals:   10/01/20 1219 10/01/20 2051 10/02/20 0550 10/02/20 1311  BP: 130/70 128/66 140/68 137/69  Pulse: 60 68 64 67  Resp: '18 12 20 20  '$ Temp:  97.7 F (36.5 C) 97.8 F (36.6 C) 98.4 F (36.9 C) 98.6 F (37 C)  TempSrc: Oral Oral Oral Oral  SpO2: 100% 98% 96% 95%  Weight:      Height:        Intake/Output Summary (Last 24 hours) at 10/02/2020 1435 Last data filed at 10/02/2020 1244 Gross per 24 hour  Intake 503.71 ml  Output 1000 ml  Net -496.29 ml   Filed Weights   09/25/20 0617 09/26/20 1245 09/28/20 0800  Weight: 70.7 kg 74.8 kg 74.8 kg    Exam:  Constitutional: NAD, lethargic,  oriented HEENT: conjunctivae and lids normal, EOMI CV: No cyanosis.   RESP: normal respiratory effort, on RA GI: no stool in colostomy bag, PEG tube present Extremities: No effusions, edema in BLE SKIN: warm, dry Neuro: II - XII grossly intact.   Psych: depressed mood and affect.     Data Reviewed:   I have personally reviewed following labs and imaging studies:  Labs: Labs show the following:   Basic Metabolic Panel: Recent Labs  Lab 09/28/20 0441 09/29/20 0505 09/30/20 0444 10/01/20 0539 10/02/20 0539  NA 132* 130* 133* 131* 131*  K 3.2* 3.4* 3.3* 3.9 3.9  CL 102 101 104 101 99  CO2 22 20* 21* 22 25  GLUCOSE 122* 112* 106* 169* 180*  BUN 6* <5* <5* <5* <5*  CREATININE 0.98 0.96 0.80 0.84 0.80  CALCIUM 8.0* 8.0* 8.2* 8.1* 8.0*  MG  --  1.9 1.9 2.0 2.1   GFR Estimated Creatinine Clearance: 86.2 mL/min (by C-G formula based on SCr of 0.8 mg/dL). Liver Function Tests: No results for input(s): AST, ALT, ALKPHOS, BILITOT, PROT, ALBUMIN in the last 168 hours.  No results for input(s): LIPASE, AMYLASE in the last 168 hours. No results for input(s): AMMONIA in the last 168 hours. Coagulation profile Recent Labs  Lab 09/25/20 1554  INR 1.1    CBC: Recent Labs  Lab 09/27/20 0414 09/29/20 0505 09/30/20 0444 10/01/20 0539 10/02/20 0539  WBC 6.7 7.3 7.6 10.6* 6.2  HGB 8.4* 8.1* 8.2* 8.3* 8.6*  HCT 25.5* 24.5* 24.4* 25.5* 25.7*  MCV 76.6* 76.1* 75.1* 75.0* 75.8*  PLT 221 224 238 250 245   Cardiac Enzymes: No results for input(s): CKTOTAL, CKMB, CKMBINDEX, TROPONINI in the last 168 hours. BNP (last 3 results) No results for input(s): PROBNP in the last 8760 hours. CBG: Recent Labs  Lab 09/30/20 2032  GLUCAP 138*   D-Dimer: No results for input(s): DDIMER in the last 72 hours. Hgb A1c: No results for input(s): HGBA1C in the last 72 hours. Lipid Profile: No results for input(s): CHOL, HDL, LDLCALC, TRIG, CHOLHDL, LDLDIRECT in the last 72 hours. Thyroid  function studies: No results for input(s): TSH, T4TOTAL, T3FREE, THYROIDAB in the last 72 hours.  Invalid input(s): FREET3 Anemia work up: No results for input(s): VITAMINB12, FOLATE, FERRITIN, TIBC, IRON, RETICCTPCT in the last 72 hours.  Sepsis Labs: Recent Labs  Lab 09/29/20 0505 09/30/20 0444 10/01/20 0539 10/02/20 0539  WBC 7.3 7.6 10.6* 6.2    Microbiology No results found for this or any previous visit (from the past 240 hour(s)).   Procedures and diagnostic studies:  No results found.  Medications:    acetaminophen  1,000 mg Oral Q6H   Chlorhexidine Gluconate Cloth  6 each Topical Daily   enoxaparin (LOVENOX) injection  40 mg Subcutaneous Q24H   feeding supplement  1 Container Oral TID BM   pantoprazole  40 mg Oral BID  potassium chloride  40 mEq Oral Once   vitamin B-12  500 mcg Oral Daily   Continuous Infusions:  dextrose 5 % and 0.45 % NaCl with KCl 20 mEq/L 50 mL/hr at 10/01/20 1524     LOS: 5 days   Enzo Bi  [facility]   10/02/2020, 2:35 PM

## 2020-10-02 NOTE — Consult Note (Signed)
[facility] Nurse ostomy consult note Patient consult received for new ostomy.  Planned first scheduled visit for ostomy assessment on Monday, 10/03/20.  [facility] nursing team will follow, and will remain available to this patient, the nursing, surgical and medical teams.   Thanks, Maudie Flakes, MSN, RN, [facility], Arther Abbott  Pager# [phone]

## 2020-10-03 ENCOUNTER — Inpatient Hospital Stay: Payer: Medicare Other

## 2020-10-03 DIAGNOSIS — K6389 Other specified diseases of intestine: Secondary | ICD-10-CM | POA: Diagnosis not present

## 2020-10-03 LAB — CBC
HCT: 29 % — ABNORMAL LOW (ref 39.0–52.0)
Hemoglobin: 9.5 g/dL — ABNORMAL LOW (ref 13.0–17.0)
MCH: 24.8 pg — ABNORMAL LOW (ref 26.0–34.0)
MCHC: 32.8 g/dL (ref 30.0–36.0)
MCV: 75.7 fL — ABNORMAL LOW (ref 80.0–100.0)
Platelets: 296 10*3/uL (ref 150–400)
RBC: 3.83 MIL/uL — ABNORMAL LOW (ref 4.22–5.81)
RDW: 18.6 % — ABNORMAL HIGH (ref 11.5–15.5)
WBC: 7.5 10*3/uL (ref 4.0–10.5)
nRBC: 0 % (ref 0.0–0.2)

## 2020-10-03 LAB — BASIC METABOLIC PANEL
Anion gap: 8 (ref 5–15)
BUN: <5 mg/dL — ABNORMAL LOW (ref 8–23)
CO2: 25 mmol/L (ref 22–32)
Calcium: 8.6 mg/dL — ABNORMAL LOW (ref 8.9–10.3)
Chloride: 99 mmol/L (ref 98–111)
Creatinine, Ser: 0.67 mg/dL (ref 0.61–1.24)
GFR, Estimated: >60 mL/min (ref >60)
Glucose, Bld: 141 mg/dL — ABNORMAL HIGH (ref 70–99)
Potassium: 3.9 mmol/L (ref 3.5–5.1)
Sodium: 132 mmol/L — ABNORMAL LOW (ref 135–145)

## 2020-10-03 LAB — MAGNESIUM: Magnesium: 2 mg/dL (ref 1.7–2.4)

## 2020-10-03 NOTE — Progress Notes (Signed)
Patient had urine output of 100 mL since the foley removal. Bladder scan showed 80 mL. On- call provider notified. No new orders at this time.

## 2020-10-03 NOTE — Progress Notes (Signed)
HEMATOLOGY-ONCOLOGY PROGRESS NOTE  SUBJECTIVE: Marcus [name] had surgery on 9/16.  He is having significant abdominal pain this morning.  He also reported some vomiting.  He has not really been out of bed yet.  No bowel movement.  Oncology History Overview Note  Called the available numbers this morning, his brother in law Marcus [name] answered.  Advised him to please talk to the patient ASAP because we are unable to get hold of him. I mentioned this is regarding the abnormal MRI and he needs to come to the ER for further treatment. Marcus [name] expressed understanding and he will try to get hold of Marcus Law ASAP. The urgency of the matter was clearly conveyed. Marcus [name] apparently talked to his room mate and conveyed the message to his room mate that Marcus [name] need to get in touch back with him urgently and he reassured me he will relay the message to Marcus [name] ASAP      REVIEW OF SYSTEMS:   A 10 point review of systems is negative except as noted in the HPI.  I have reviewed the past medical history, past surgical history, social history and family history with the patient and they are unchanged from previous note.   PHYSICAL EXAMINATION: ECOG PERFORMANCE STATUS: 1 - Symptomatic but completely ambulatory  Vitals:   10/02/20 2022 10/03/20 0402  BP: 137/79 (!) 147/81  Pulse: 72 78  Resp: 16 14  Temp: 98.3 F (36.8 C) 98.1 F (36.7 C)  SpO2: 95% 94%   Filed Weights   09/25/20 0617 09/26/20 1245 09/28/20 0800  Weight: 70.7 kg 74.8 kg 74.8 kg    Intake/Output from previous day: 09/18 0701 - 09/19 0700 In: -  Out: 730 [Urine:300; Emesis/NG output:250; Drains:180]  GENERAL: Laying in bed, resting, moans out in pain at times LUNGS: clear to auscultation and percussion with normal breathing effort HEART: regular rate & rhythm and no murmurs and no [name] extremity edema ABDOMEN: Soft, tender to light palpation, G-tube to suction, JP drain in place, colostomy with pink stoma, dressing  clean dry intact. NEURO: alert & oriented x 3 with fluent speech, no focal motor/sensory deficits  LABORATORY DATA:  I have reviewed the data as listed CMP Latest Ref Rng & Units 10/03/2020 10/02/2020 10/01/2020  Glucose 70 - 99 mg/dL 141(H) 180(H) 169(H)  BUN 8 - 23 mg/dL <5(L) <5(L) <5(L)  Creatinine 0.61 - 1.24 mg/dL 0.67 0.80 0.84  Sodium 135 - 145 mmol/L 132(L) 131(L) 131(L)  Potassium 3.5 - 5.1 mmol/L 3.9 3.9 3.9  Chloride 98 - 111 mmol/L 99 99 101  CO2 22 - 32 mmol/L 25 25 22   Calcium 8.9 - 10.3 mg/dL 8.6(L) 8.0(L) 8.1(L)  Total Protein 6.5 - 8.1 g/dL - - -  Total Bilirubin 0.3 - 1.2 mg/dL - - -  Alkaline Phos 38 - 126 U/L - - -  AST 15 - 41 U/L - - -  ALT 0 - 44 U/L - - -    Lab Results  Component Value Date   WBC 7.5 10/03/2020   HGB 9.5 (L) 10/03/2020   HCT 29.0 (L) 10/03/2020   MCV 75.7 (L) 10/03/2020   PLT 296 10/03/2020   NEUTROABS 6.4 09/25/2020    DG Lumbar Spine 2-3 Views  Result Date: 09/18/2020 CLINICAL DATA:  Elective surgery. EXAM: LUMBAR SPINE - 2-3 VIEW COMPARISON:  Prior thoracic spine imaging. FINDINGS: Two lateral coned spot views of the spine obtained in the operating room. Pedicle screws are seen  at 2 contiguous levels as well as the more superior level. Levels are not well delineated on these coned operative views. Fluoroscopy time 1 minute 6 seconds. IMPRESSION: Procedural fluoroscopy for spine surgery, recommend correlation with operative report for details. Electronically Signed   By: Keith Rake M.D.   On: 09/18/2020 18:36   DG Abd 1 View  Result Date: 10/02/2020 CLINICAL DATA:  Postoperative Hartmann's and PEG placement. Generalized abdominal pain. EXAM: ABDOMEN - 1 VIEW COMPARISON:  CT chest abdomen and pelvis 09/26/2020. FINDINGS: There is skin staples and surgical drain in the pelvis, new from prior. There is diffuse gaseous distension of large and small bowel to the level of the rectum. There is some mildly dilated air-filled small bowel  loops. There is mild elevation of the right hemidiaphragm. Free air cannot be excluded on a supine view. Peg tube overlies the upper abdomen. 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 IMPRESSION: 1. There is diffuse gaseous distension of large and small bowel suspicious for ileus. Some small bowel loops are mildly dilated and a small bowel obstruction cannot be excluded. 2. New postsurgical changes in the pelvis. Electronically Signed   By: Ronney Asters M.D.   On: 10/02/2020 15:51   Marcus THORACIC SPINE W WO CONTRAST  Result Date: 09/16/2020 CLINICAL DATA:  Cord compression. 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 Evaluate for bone metastases. EXAM: MRI THORACIC WITHOUT AND WITH CONTRAST TECHNIQUE: Multiplanar and multiecho pulse sequences of the thoracic spine were obtained without and with intravenous contrast. CONTRAST:  7.38mL GADAVIST GADOBUTROL 1 MMOL/ML IV SOLN COMPARISON:  CT of the thoracic spine 08/31/2020. FINDINGS: Alignment:  No significant spondylolisthesis. Vertebrae: Osseous lesions within the posterior T5 vertebral body and T5 posterior elements, as well as T6 vertebral body and posterior elements. Involvement of the T6 vertebral body and posterior elements is extensive. In addition to involvement of the right T6 transverse process, there is also involvement of the posterior right sixth rib. There is also involvement of the bilateral T7 pedicles/articular pillars, T7 spinous process, right T7 transverse process, and subtle involvement of the posterior T7 vertebral body. There is subtle heterogeneous enhancement of the T8 spinous process, which could reflect an additional lesion (series 23, image 8). Aditionally, there is partially ossified enhancing epidural tumor within the right posterolateral aspect of the spinal canal at the T5-T6 level. Resultant severe spinal canal stenosis with spinal cord compression. There may be subtle T2 hyperintense signal  abnormality within the spinal cord at this level compatible with myelomalacia and/or focal edema (series 17, image 10). Additionally, tumor encroaches upon the right T5-T6 and T6-T7 neural foramina. Cord: Significant spinal cord compression at T5-T6 by tumor, as described above. Possible subtle T2/STIR hyperintense signal abnormality within the spinal cord at this level, which may reflect focal edema and/or myelomalacia (series 17, image 10). Paraspinal and other soft tissues: No abnormality identified within included portions of the thorax or upper abdomen/retroperitoneum. Disc levels: No more than mild disc degeneration within the thoracic spine. No significant focal disc herniation or degenerative spinal canal stenosis. Mild facet arthrosis within the [name] thoracic spine. No degenerative compressive foraminal stenosis. Impressions 1 and 2 will be called to the ordering clinician or representative by the Radiologist Assistant, and communication documented in the PACS or [employer]. IMPRESSION: Osseous lesions within the T5, T6, T7 and possibly T8 vertebra, as well as right 6th rib as detailed. These findings are highly suspicious for osseous metastatic disease given the patient's history of  prostate cancer. 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 Subtle signal abnormality is questioned within the spinal cord at this level, and this may reflect focal edema and/or myelomalacia. Tumor also encroaches upon the right T5-T6 and T6-T7 neural foramina. Electronically Signed   By: Kellie Simmering D.O.   On: 09/16/2020 20:04   CT CHEST ABDOMEN PELVIS W CONTRAST  Result Date: 09/26/2020 CLINICAL DATA:  Esophageal mass, history of prostate cancer, thoracic bony metastases, epidural thoracic mass on previous MRI EXAM: CT CHEST, ABDOMEN, AND PELVIS WITH  CONTRAST TECHNIQUE: Multidetector CT imaging of the chest, abdomen and pelvis was performed following the standard protocol during bolus administration of intravenous contrast. CONTRAST:  10mL OMNIPAQUE IOHEXOL 350 MG/ML SOLN COMPARISON:  09/16/2020, 08/31/2020 FINDINGS: CT CHEST FINDINGS Cardiovascular: Trace pericardial effusion. The heart is not enlarged. No evidence of thoracic aortic aneurysm or dissection. Stable atherosclerosis. Mediastinum/Nodes: Irregular mural thickening within the midthoracic esophagus at the level of the carina likely corresponds to the reported history of esophageal mass, and is suspicious for neoplasm. No pathologic adenopathy within the mediastinum, hila, or axilla. Thyroid and trachea are unremarkable. Lungs/Pleura: Hypoventilatory changes are seen at the lung bases, with trace bilateral pleural effusions. No acute airspace disease or pneumothorax. No pulmonary nodules. Musculoskeletal: Postsurgical changes are seen from interval laminectomy and posterior fusion spanning T4 through T8. Sclerotic lesions within the T5 and T6 vertebral bodies as well as the right posterior sixth rib are unchanged. There are no acute displaced fractures. Reconstructed images demonstrate no additional findings. CT ABDOMEN PELVIS FINDINGS Hepatobiliary: Within the right lobe liver image 54/2 there is a 2.9 x 2.0 cm hypodensity which has developed since prior study, highly concerning for metastatic disease. Numerous other punctate less than 3 mm hypodensities within the remainder of the liver are indeterminate. No intrahepatic duct dilation. Small calcified gallstone without evidence of acute cholecystitis. Pancreas: Unremarkable. No pancreatic ductal dilatation or surrounding inflammatory changes. Spleen: Normal in size without focal abnormality. Adrenals/Urinary Tract: Chronic thickening of the lateral limb of the left adrenal gland measuring up to 11 mm, unchanged since 2015 and consistent with adenoma  or hyperplasia. The right adrenals unremarkable. Stable small left renal cyst. No urinary tract calculi or obstructive uropathy. The bladder is unremarkable. Stomach/Bowel: There is an apple-core type lesion involving the distal sigmoid colon, with irregular circumferential wall thickening. This mass extends approximately 4.3 cm in length, and measures approximately 5.0 x 4.9 cm in cross-sectional diameter. This is highly concerning for colonic neoplasm. There is moderate retained stool within the more proximal colon, likely due to partial colonic obstruction. No evidence of small-bowel obstruction. Vascular/Lymphatic: Epiphrenic lymphadenopathy is identified, with lymph node mass anterior to the right crus of the diaphragm measuring up to 3.7 x 2.3 cm reference image 56/2. Lymphadenopathy within the gastrohepatic ligament measures up to 1.3 cm in short axis reference image 55/2. Retroperitoneal lymphadenopathy in the left para-aortic region measures up to 1.2 cm in short axis reference image 67/2. Numerous other enlarged retroperitoneal lymph nodes are seen within the aortocaval region as well. Findings are consistent with nodal metastases. There is diffuse atherosclerosis of the aorta. There is high-grade greater than 90% stenosis within the left common iliac artery. Moderate stenosis is seen within the right common iliac artery estimated 50%. Reproductive: Prostate is surgically absent. Other: Trace free fluid within the pelvis. No free intraperitoneal gas. No  abdominal wall hernia. Musculoskeletal: Unremarkable right hip arthroplasty. 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 No acute or destructive bony lesions. Reconstructed images demonstrate no additional findings. IMPRESSION: 1. Irregular circumferential wall thickening within the midthoracic esophagus consistent with the esophageal mass described in history. Findings are highly suspicious for neoplasm. 2. Circumferential apple-core  type mass involving the distal sigmoid colon compatible with colonic malignancy. Colonoscopy recommended if not previously performed. There is an element of partial colonic obstruction with significant proximal colonic dilation and fecal retention. 3. Extensive lymphadenopathy within the abdomen and pelvis as above, largest lymph node mass in the upper phrenic region, consistent with nodal metastases. 4. Hypodensities within the liver, largest in the right lobe suspicious for metastatic disease. 5. Trace bilateral pleural effusions. 6. Postsurgical changes from recent thoracic fusion and laminectomy. 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 Please refer to previous MRI discussion. 7. 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 8. 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 Electronically Signed   By: Randa Ngo M.D.   On: 09/26/2020 17:56   DG C-Arm 1-60 Min-No Report  Result Date: 09/18/2020 Fluoroscopy was utilized by the requesting physician.  No radiographic interpretation.    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 Tresa Moore.  Had extracapsular extension and positive resection margins. -Status post postoperative radiation therapy by Dr. Tammi Klippel. -Lost to follow-up after treatment.   2) T5-T7 sclerotic bone lesion involving posterior elements with right-sided epidural tumor and T5-6 T6-7 foraminal infiltration. 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  The tumor is negative with CDX2, cytokeratin 7, cytokeratin 20, MOC31, prostein and prostate-specific antigen.  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   4) apple core type mass of the distal sigmoid colon noted on CT scan from 09/26/2020 -CEA 2.9 on 09/28/2020 -Colonoscopy performed 09/28/2020 showed that mass is obstructing the lumen. -Biopsy from 09/28/2020 showed a high-grade dysplasia arising in a tubular adenoma-biopsy may not be representative of the mass seen endoscopically -Status post laparoscopic-assisted Hartman's with G-tube placement on 09/30/2020  5) extensive lymphadenopathy in the abdomen and hypodensities in the liver concerning for metastatic disease  6) microcytic anemia secondary to GI bleed, B12 deficiency -Labs from 09/25/2020-ferritin 374, iron 14, TIBC 235, percent saturation 6%, folate 7.5, vitamin B12 140 -Iron sucrose 200 mg IV x3 doses started on 09/29/2020   7) active smoker half a pack per day 8) poor medical follow-up 9) family history of GI malignancy -Reported sister with colon malignancy  PLAN: -Discussed biopsy from laminectomy with the patient.  Biopsy was consistent with current, metastatic prostate cancer.  Will need follow-up with radiation oncology.  We will also consider him for Lupron therapy as an outpatient. -He has a new diagnosis of esophageal cancer.  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  He  is not a candidate for esophagectomy or resection of the esophageal mass.  We will consider a course of radiation once he recovers from surgery. -The patient was noted to have a colon mass noted on CT scan.  Colonoscopy was performed on 9/14 which showed that the mass was  completely obstructing the lumen of the colon.  Biopsy was not consistent with colon cancer, but mass is still highly suspicious for colon malignancy.  CEA normal.  Status post resection 9/16.  He is still recovering.  We will follow-up on the biopsy.  He will need a course of systemic chemotherapy once he recovers. -We will need a Port-A-Cath placed prior to discharge anticipation of chemotherapy. -May consider biopsy of one of liver lesion at some point in the future. -Monitor hemoglobin closely.  Transfuse for hemoglobin less than 7.5 -Vitamin B12 is being replaced.   LOS: 6 days   Mikey Bussing, DNP, AGPCNP-BC, AOCNP 10/03/20

## 2020-10-03 NOTE — Progress Notes (Signed)
3 Days Post-Op  Subjective: Had some nausea and vomiting yesterday.  They have placed g-tube to LIWS.  Hasn't been up yet.  Going to bathroom right now to void.  ROS: See above, otherwise other systems negative  Objective: Vital signs in last 24 hours: Temp:  [98.1 F (36.7 C)-98.6 F (37 C)] 98.1 F (36.7 C) (09/19 0402) Pulse Rate:  [67-78] 78 (09/19 0402) Resp:  [14-20] 14 (09/19 0402) BP: (137-147)/(69-81) 147/81 (09/19 0402) SpO2:  [94 %-95 %] 94 % (09/19 0402) Last BM Date: 09/28/20  Intake/Output from previous day: 09/18 0701 - 09/19 0700 In: -  Out: 730 [Urine:300; Emesis/NG output:250; Drains:180] Intake/Output this shift: No intake/output data recorded.  PE: Gen: NAD Heart: regular Lungs: CTAB Abd: soft, appropriately tender, some distention, g-tube to LIWS with some bilious output, 250cc noted.  JP drain full of serous fluid. 180cc documented yesterday.  Midline incision with staples, honeycomb in place.  Colostomy with viable and pink stoma, but no output except sweat  Lab Results:  Recent Labs    10/02/20 0539 10/03/20 0442  WBC 6.2 7.5  HGB 8.6* 9.5*  HCT 25.7* 29.0*  PLT 245 296   BMET Recent Labs    10/02/20 0539 10/03/20 0442  NA 131* 132*  K 3.9 3.9  CL 99 99  CO2 25 25  GLUCOSE 180* 141*  BUN <5* <5*  CREATININE 0.80 0.67  CALCIUM 8.0* 8.6*   PT/INR No results for input(s): LABPROT, INR in the last 72 hours. CMP     Component Value Date/Time   NA 132 (L) 10/03/2020 0442   K 3.9 10/03/2020 0442   CL 99 10/03/2020 0442   CO2 25 10/03/2020 0442   GLUCOSE 141 (H) 10/03/2020 0442   BUN <5 (L) 10/03/2020 0442   CREATININE 0.67 10/03/2020 0442   CREATININE 1.01 09/15/2020 1415   CALCIUM 8.6 (L) 10/03/2020 0442   PROT 6.3 (L) 09/25/2020 1304   ALBUMIN 3.1 (L) 09/25/2020 1304   AST 20 09/25/2020 1304   AST 13 (L) 09/15/2020 1415   ALT 26 09/25/2020 1304   ALT 11 09/15/2020 1415   ALKPHOS 67 09/25/2020 1304   BILITOT 0.9  09/25/2020 1304   BILITOT 0.7 09/15/2020 1415   GFRNONAA >60 10/03/2020 0442   GFRNONAA >60 09/15/2020 1415   GFRAA >60 09/17/2018 0607   Lipase     Component Value Date/Time   LIPASE 36 09/14/2018 1838       Studies/Results: DG Abd 1 View  Result Date: 10/02/2020 CLINICAL DATA:  Postoperative Hartmann's and PEG placement. Generalized abdominal pain. EXAM: ABDOMEN - 1 VIEW COMPARISON:  CT chest abdomen and pelvis 09/26/2020. FINDINGS: There is skin staples and surgical drain in the pelvis, new from prior. There is diffuse gaseous distension of large and small bowel to the level of the rectum. There is some mildly dilated air-filled small bowel loops. There is mild elevation of the right hemidiaphragm. Free air cannot be excluded on a supine view. Peg tube overlies the upper abdomen. 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 IMPRESSION: 1. There is diffuse gaseous distension of large and small bowel suspicious for ileus. Some small bowel loops are mildly dilated and a small bowel obstruction cannot be excluded. 2. New postsurgical changes in the pelvis. Electronically Signed   By: Ronney Asters M.D.   On: 10/02/2020 15:51    Anti-infectives: Anti-infectives (From admission, onward)    Start     Dose/Rate Route Frequency  Ordered Stop   09/30/20 0900  cefoTEtan (CEFOTAN) 2 g in sodium chloride 0.9 % 100 mL IVPB        2 g 200 mL/hr over 30 Minutes Intravenous On call to O.R. 09/30/20 0800 09/30/20 1421        Assessment/Plan POD 3, s/p lap assisted Hartmann's with g-tube placement by Dr. Redmond Pulling 09/30/20 for Obstructing sigmoid colon mass - some N/V yesterday.  G-tube placed to LIWS overnight.  Leave like this today and see how he does. -WOC following for new colostomy -JP with serous fluid -needs to mobilize, PT/OT eval, recommending SNF vs HH pending improvement  FEN - NPO, g-tube to LIWS today/IVFs VTE - Lovenox ID - cefotetan on call to  OR  Circumferential non-obstructing esophageal mass - s/p EGD 9/12 and path with poorly differentiated carcinoma - given metastatic disease, esophagectomy or resection seems unlikely to be an option  -g-tube placed in case he needs it moving forward Metastatic prostate cancer - s/p radical prostatectomy and radiation therapy in 2015/2016 - s/p laminectomy for 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 Arnoldo Morale   LOS: 6 days    Henreitta Cea , [facility] 10/03/2020, 8:05 AM Please see Amion for pager number during day hours 7:00am-4:30pm or 7:00am -11:30am on weekends

## 2020-10-03 NOTE — Care Management Important Message (Signed)
Important Message  Patient Details IM Letter placed in Patient's room. Name: Marcus [name] MRN: [id number] Date of Birth: [birth year]   Medicare Important Message Given:  Yes     Kerin Salen 10/03/2020, 12:05 PM

## 2020-10-03 NOTE — Progress Notes (Signed)
Chaplain engaged in an initial visit with Marcus [name] this morning.  When Chaplain arrived he was sleeping. [name] expressed for Chaplain to come back.    Chaplain will follow-up.    10/03/20 1200  Clinical Encounter Type  Visited With Patient  Visit Type Initial

## 2020-10-03 NOTE — Progress Notes (Signed)
Progress Note    Marcus [name]  IRS:[id number] DOB: 25-Sep-1945  DOA: 09/25/2020 PCP: [name], MD    Brief Narrative:    Medical records reviewed and are as summarized below:  Marcus [name] is an 75 y.o. 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 Arnoldo Morale on 9/4 and anemia returning with upper back pain, decreased appetite and black stool.  EGD 9/12- mass biopsied.  CT scan of abdomen also shows possible mass.  Oncology consult.    Assessment/Plan:   Active Problems:   Prostate cancer (HCC)   Hyponatremia   GI bleed   Esophageal cancer (HCC)   Esophageal mass   Colonic mass   Goals of care, counseling/discussion   Post-op ileus --Pt complained of nausea and abdominal pain, declined to have any oral intake. --KUB showed diffuse gaseous distension of large and small bowel suspicious for ileus.  PEG tube connected to intermittent suction on 9/18. Plan: --cont intermittent suction for decompression, monitor for output --NPO for now except for ice chips for comfort --anti-emetics and IV toradol PRN for pain  Symptomatic microcytic anemia 2/2 GI bleed, Iron def and Vit B12 def -EGD 9/12- mass biopsied: Poorly differentiated carcinoma -CT scan of abdomen also shows mass- colonoscopy today found sigmoid obstructing mass --Hgb 8's -s/p IM Vit B12 x 3 days, and IV iron x3 days Plan: --Monitor Hgb and transfuse if Hgb <7  Sigmoid obstructing mass S/p Hartman's procedure with end colostomy on 9/16 --currently having post-op ileus --follow colostomy output to monitor return of bowel function  Esophageal mass  S/p PEG tube placement on 9/16 --between 25 to 30 cm compatible with poorly differentiated carcinoma, immunophenotype consistent with squamous cell  carcinoma. Plan: --management by oncology  Postsurgical back pain -from recent thoracic spine surgery.  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 Arnoldo Morale on 9/4.  Surgical wound appears clean.  No focal neuro symptoms or bowel or bladder habit change. Plan: --avoid opioids pain meds due to ileus -Outpatient follow-up with neurosurgery -PT/OT eval- home health   current, metastatic prostate cancer History of prostate cancer with osseous mets s/p radical prostatectomy and radiation in 2015.   --confirmed with biopsy from laminectomy Plan: --management per oncology   Pyuria/bacteriuria -patient denies UTI symptoms, fever, chills, suprapubic pain or lower back pain. -No indication for treatment.  Hypokalemia --monitor and replete PRN  Hyponatremia, improved --monitor with daily labs  Vitamin D def -will need replacement after procedures   DVT prophylaxis: scd Code Status: Full Code.  Status is: inpt   Dispo: The patient is from: Home              Anticipated d/c is to: undetermined               Patient currently is not medically stable to d/c.  No bowel function yet.  Post-op ileus     Difficult to place patient No    Medical Consultants:   GI oncology  Subjective:   RN was able to connect PEG tube to intermittent wall suction, with dark green fluid and gas output.  Pt reported feeling a lot better today.     Objective:    Vitals:   10/02/20 2022 10/03/20 0402 10/03/20 1335 10/03/20 1958  BP: 137/79 Marland Kitchen)  147/81 129/66 (!) 126/53  Pulse: 72 78 89 86  Resp: 16 14 20 20   Temp: 98.3 F (36.8 C) 98.1 F (36.7 C) 99.2 F (37.3 C) 99.4 F (37.4 C)  TempSrc: Oral Oral Oral Oral  SpO2: 95% 94% 94% 94%  Weight:      Height:        Intake/Output Summary (Last 24 hours) at 10/03/2020 2104 Last data filed at 10/03/2020 1812 Gross per 24 hour  Intake --  Output 1580 ml  Net -1580  ml   Filed Weights   09/25/20 0617 09/26/20 1245 09/28/20 0800  Weight: 70.7 kg 74.8 kg 74.8 kg    Exam:  Constitutional: NAD, AAOx3 HEENT: conjunctivae and lids normal, EOMI CV: No cyanosis.   RESP: normal respiratory effort, on RA GI:  PEG tube connected to wall suction with dark green fluid return.  No stool in colostomy. Extremities: No effusions, edema in BLE SKIN: warm, dry Neuro: II - XII grossly intact.   Psych: better mood and affect.  Appropriate judgement and reason   Data Reviewed:   I have personally reviewed following labs and imaging studies:  Labs: Labs show the following:   Basic Metabolic Panel: Recent Labs  Lab 09/29/20 0505 09/30/20 0444 10/01/20 0539 10/02/20 0539 10/03/20 0442  NA 130* 133* 131* 131* 132*  K 3.4* 3.3* 3.9 3.9 3.9  CL 101 104 101 99 99  CO2 20* 21* 22 25 25   GLUCOSE 112* 106* 169* 180* 141*  BUN <5* <5* <5* <5* <5*  CREATININE 0.96 0.80 0.84 0.80 0.67  CALCIUM 8.0* 8.2* 8.1* 8.0* 8.6*  MG 1.9 1.9 2.0 2.1 2.0   GFR Estimated Creatinine Clearance: 86.2 mL/min (by C-G formula based on SCr of 0.67 mg/dL). Liver Function Tests: No results for input(s): AST, ALT, ALKPHOS, BILITOT, PROT, ALBUMIN in the last 168 hours.  No results for input(s): LIPASE, AMYLASE in the last 168 hours. No results for input(s): AMMONIA in the last 168 hours. Coagulation profile No results for input(s): INR, PROTIME in the last 168 hours.   CBC: Recent Labs  Lab 09/29/20 0505 09/30/20 0444 10/01/20 0539 10/02/20 0539 10/03/20 0442  WBC 7.3 7.6 10.6* 6.2 7.5  HGB 8.1* 8.2* 8.3* 8.6* 9.5*  HCT 24.5* 24.4* 25.5* 25.7* 29.0*  MCV 76.1* 75.1* 75.0* 75.8* 75.7*  PLT 224 238 250 245 296   Cardiac Enzymes: No results for input(s): CKTOTAL, CKMB, CKMBINDEX, TROPONINI in the last 168 hours. BNP (last 3 results) No results for input(s): PROBNP in the last 8760 hours. CBG: Recent Labs  Lab 09/30/20 2032  GLUCAP 138*   D-Dimer: No results  for input(s): DDIMER in the last 72 hours. Hgb A1c: Recent Labs    10/02/20 0539  HGBA1C 6.5*   Lipid Profile: No results for input(s): CHOL, HDL, LDLCALC, TRIG, CHOLHDL, LDLDIRECT in the last 72 hours. Thyroid function studies: No results for input(s): TSH, T4TOTAL, T3FREE, THYROIDAB in the last 72 hours.  Invalid input(s): FREET3 Anemia work up: No results for input(s): VITAMINB12, FOLATE, FERRITIN, TIBC, IRON, RETICCTPCT in the last 72 hours.  Sepsis Labs: Recent Labs  Lab 09/30/20 0444 10/01/20 0539 10/02/20 0539 10/03/20 0442  WBC 7.6 10.6* 6.2 7.5    Microbiology No results found for this or any previous visit (from the past 240 hour(s)).   Procedures and diagnostic studies:  DG Abd 1 View  Result Date: 10/02/2020 CLINICAL DATA:  Postoperative Hartmann's and PEG placement. Generalized abdominal pain. EXAM: ABDOMEN - 1 VIEW  COMPARISON:  CT chest abdomen and pelvis 09/26/2020. FINDINGS: There is skin staples and surgical drain in the pelvis, new from prior. There is diffuse gaseous distension of large and small bowel to the level of the rectum. There is some mildly dilated air-filled small bowel loops. There is mild elevation of the right hemidiaphragm. Free air cannot be excluded on a supine view. Peg tube overlies the upper abdomen. 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 IMPRESSION: 1. There is diffuse gaseous distension of large and small bowel suspicious for ileus. Some small bowel loops are mildly dilated and a small bowel obstruction cannot be excluded. 2. New postsurgical changes in the pelvis. Electronically Signed   By: Ronney Asters M.D.   On: 10/02/2020 15:51    Medications:    acetaminophen  1,000 mg Oral Q6H   Chlorhexidine Gluconate Cloth  6 each Topical Daily   enoxaparin (LOVENOX) injection  40 mg Subcutaneous Q24H   feeding supplement  1 Container Oral TID BM   pantoprazole  40 mg Oral BID   potassium chloride  40 mEq Oral Once    vitamin B-12  500 mcg Oral Daily   Continuous Infusions:  dextrose 5 % and 0.45 % NaCl with KCl 20 mEq/L 50 mL/hr at 10/03/20 0516     LOS: 6 days   Enzo Bi  [facility]   10/03/2020, 9:04 PM

## 2020-10-03 NOTE — Consult Note (Addendum)
[place] Nurse ostomy consult note Pt had colostomy surgery performed 9/16.  He denies any family who will be helping after discharge.  Pt did not participate and is very weak and not feeling well.  He may benefit from SNF after discharge if this continues to be his state of health.  Stoma type/location: Stoma is red and viable, above skin level, 1 1/2 inches Peristomal assessment: intact skin surrounding Output: small amt liquid, no stool or flatus in the pouch Ostomy pouching: 2pc.  Education provided:  Demonstrated pouch change using 2 piece pouch.  Pt watched the process using a hand-held mirror but did not ask questions or participate.  Discussed pouch change routines and demonstrated emptying. 4 extra one piece pouches left in the room for staff nurse use. Enrolled patient in [employer] program: Yes WOC will continue teaching sessions later this week.  Thank-you,  Julien Girt MSN, [place], [place], [place], [place]

## 2020-10-03 NOTE — Progress Notes (Signed)
Nutrition Follow-up  INTERVENTION:   Once diet advanced: -resume Boost Breeze po TID, each supplement provides 250 kcal and 9 grams of protein -Multivitamin with minerals daily  TF recommendations, if needed for nutrition support: -Osmolite 1.5 @ 20 ml/hr via PEG, advance by 10 ml every 12 hours to goal of 55 ml/hr. Providing 1980 kcals, 82g protein and 1005 ml H2O  NUTRITION DIAGNOSIS:   Increased nutrient needs related to acute illness as evidenced by estimated needs.  Ongoing.  GOAL:   Patient will meet greater than or equal to 90% of their needs  Not meeting.  MONITOR:   Diet advancement, Labs, Weight trends, I & O's  ASSESSMENT:   75 y.o. 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 Arnoldo Morale on 9/4 and anemia returning with upper back pain, decreased appetite and black stool.  9/4: s/p laminectomy for thoracic tumor  9/11: admitted 9/12: s/p EGD 9/14: s/p flex sigmoidoscopy 9/16: s/p lap assisted Hartman's procedure with end colostomy, PEG tube placement  Currently pt is NPO. G tube is set to LIS. Will provide TF recommendations if tube is needed. Has been having N/V.   Admission weight: 155 lbs. Last weight 9/14: 164 lbs  Medications: Vitamin B-12, D5 infusion, IV Zofran  Labs reviewed: CBGs: 138  Low Na  Diet Order:   Diet Order             Diet NPO time specified Except for: Ice Chips, Other (See Comments)  Diet effective now                   EDUCATION NEEDS:   No education needs have been identified at this time  Skin:  Skin Assessment: Skin Integrity Issues: Skin Integrity Issues:: Incisions Incisions: 9/11 vertebral column, 9/16 abdomen  Last BM:  9/19 - colostomy  Height:   Ht Readings from Last 1 Encounters:  09/28/20 '5\' 10"'$  (1.778 m)    Weight:    Wt Readings from Last 1 Encounters:  09/28/20 74.8 kg    BMI:  Body mass index is 23.66 kg/m.  Estimated Nutritional Needs:   Kcal:  1800-2000  Protein:  85-100g  Fluid:  2L/day   Clayton Bibles, MS, RD, LDN Inpatient Clinical Dietitian Contact information available via Amion

## 2020-10-04 ENCOUNTER — Ambulatory Visit
Admit: 2020-10-04 | Discharge: 2020-10-04 | Disposition: A | Discharge status: Home / Self Care | Payer: Medicare Other | Attending: Radiation Oncology | Admitting: Radiation Oncology

## 2020-10-04 ENCOUNTER — Other Ambulatory Visit: Payer: Self-pay

## 2020-10-04 ENCOUNTER — Ambulatory Visit (HOSPITAL_COMMUNITY): Payer: Medicare Other

## 2020-10-04 ENCOUNTER — Ambulatory Visit: Payer: Medicare Other | Admitting: Radiation Oncology

## 2020-10-04 ENCOUNTER — Inpatient Hospital Stay (HOSPITAL_COMMUNITY)
Admit: 2020-10-04 | Discharge: 2020-10-04 | Disposition: A | Discharge status: Home / Self Care | Payer: Medicare Other | Attending: Radiation Oncology | Admitting: Radiation Oncology

## 2020-10-04 DIAGNOSIS — C61 Malignant neoplasm of prostate: Secondary | ICD-10-CM

## 2020-10-04 DIAGNOSIS — C7951 Secondary malignant neoplasm of bone: Secondary | ICD-10-CM

## 2020-10-04 DIAGNOSIS — C154 Malignant neoplasm of middle third of esophagus: Secondary | ICD-10-CM

## 2020-10-04 DIAGNOSIS — C187 Malignant neoplasm of sigmoid colon: Secondary | ICD-10-CM

## 2020-10-04 LAB — TYPE AND SCREEN
ABO/RH(D): A POS
Antibody Screen: NEGATIVE
Unit division: 0
Unit division: 0

## 2020-10-04 LAB — CBC
HCT: 26.2 % — ABNORMAL LOW (ref 39.0–52.0)
Hemoglobin: 8.5 g/dL — ABNORMAL LOW (ref 13.0–17.0)
MCH: 25 pg — ABNORMAL LOW (ref 26.0–34.0)
MCHC: 32.4 g/dL (ref 30.0–36.0)
MCV: 77.1 fL — ABNORMAL LOW (ref 80.0–100.0)
Platelets: 305 10*3/uL (ref 150–400)
RBC: 3.4 MIL/uL — ABNORMAL LOW (ref 4.22–5.81)
RDW: 19.2 % — ABNORMAL HIGH (ref 11.5–15.5)
WBC: 12.6 10*3/uL — ABNORMAL HIGH (ref 4.0–10.5)
nRBC: 0 % (ref 0.0–0.2)

## 2020-10-04 LAB — BPAM RBC
Blood Product Expiration Date: 202210102359
Blood Product Expiration Date: 202210122359
Unit Type and Rh: 6200
Unit Type and Rh: 6200

## 2020-10-04 LAB — MAGNESIUM: Magnesium: 2 mg/dL (ref 1.7–2.4)

## 2020-10-04 LAB — BASIC METABOLIC PANEL
Anion gap: 8 (ref 5–15)
BUN: 7 mg/dL — ABNORMAL LOW (ref 8–23)
CO2: 26 mmol/L (ref 22–32)
Calcium: 8.4 mg/dL — ABNORMAL LOW (ref 8.9–10.3)
Chloride: 100 mmol/L (ref 98–111)
Creatinine, Ser: 0.85 mg/dL (ref 0.61–1.24)
GFR, Estimated: >60 mL/min (ref >60)
Glucose, Bld: 120 mg/dL — ABNORMAL HIGH (ref 70–99)
Potassium: 4.2 mmol/L (ref 3.5–5.1)
Sodium: 134 mmol/L — ABNORMAL LOW (ref 135–145)

## 2020-10-04 MED ORDER — METHOCARBAMOL 1000 MG/10ML IJ SOLN
500.0000 mg | Freq: Three times a day (TID) | INTRAVENOUS | Status: DC
  Administered 2020-10-04 – 2020-10-10 (×19): 500 mg via INTRAVENOUS
  Filled 2020-10-04 (×3): qty 500
  Filled 2020-10-04: qty 5
  Filled 2020-10-04 (×9): qty 500
  Filled 2020-10-04: qty 5
  Filled 2020-10-04 (×7): qty 500
  Filled 2020-10-04: qty 5

## 2020-10-04 MED ORDER — HYDROMORPHONE HCL 1 MG/ML IJ SOLN
0.5000 mg | INTRAMUSCULAR | Status: DC | PRN
Start: 2020-10-04 — End: 2020-10-10
  Administered 2020-10-04 – 2020-10-10 (×10): 0.5 mg via INTRAVENOUS
  Filled 2020-10-04 (×10): qty 0.5

## 2020-10-04 MED ORDER — OXYCODONE HCL 5 MG PO TABS
5.0000 mg | ORAL_TABLET | ORAL | Status: DC | PRN
  Administered 2020-10-05 – 2020-10-09 (×5): 5 mg via ORAL
  Filled 2020-10-04 (×5): qty 1

## 2020-10-04 NOTE — Progress Notes (Signed)
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    ICD-10-CM   1. Prostate cancer ([facility])  C61     2. Bone metastases ([place])  C79.51     3. Cancer of sigmoid colon [facility]) pending confirmatory biopsy  C18.7     4. 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[facility])  C15.4       HISTORY OF PRESENT ILLNESS::Marcus [name] is a 75 y.o. male who, in 2015, was found to have a markedly elevated PSA of 144.  His PSA was 155 on 06/22/2013.  His rectal examination showed induration involving most of his left lobe of the prostate.  He underwent ultrasound-guided biopsies on 06/30/2013 which showed extensive carcinoma throughout the entire gland.  He had Gleason 9 (4+5) involving the left mid and apical biopsies.  Other biopsies containing Gleason 8 and Gleason 7.  His staging workup including a CAT scan and bone scan were without evidence for metastatic disease.  He underwent a robotic prostatectomy and lymphadenectomy on 09/04/2013.  He was found to have extensive Gleason 9 (4+5) with a tertiary pattern of 3.  He had involvement of both lobes with extraprostatic extension present involving the left and right resection margins and bladder neck margin.  Seminal vesicles were not involved.  7 lymph nodes were free of metastatic disease.  He did well postoperatively.  His post-prostatectomy PSA in October 2015 was 9.72 rising to 15.79 on 01/25/2014.  Repeat CT scans and bone scans on 02/25/2014 were without evidence for metastatic disease.  He received radiation therapy under Dr.  Valere Dross with curative intent 05/27/2014 through 07/09/2014 giving Prostate bed 60 Gy in 30 sessions, pelvic lymph nodes 49.5 Gy in 30 sessions.  The patient was subsequently lost to follow-up and presented to the ED on 08/05/20 and again on 08/31/20 with severe mid thoracic back pain.  CT Chest on 08/31/20 showed Sclerotic bone lesion at T5 and T6 with canal and foraminal infiltration.  MRI T-spine on 09/16/20 confirmed Significant spinal cord compression at T5-T6 by tumor.    He underwent emergent T5 and T6 laminectomy for resection of extradural tumor; posterior segmental instrumentation from T4 to T8.  Pathology revealed Metastatic prostatic adenocarcinoma staining positive with prostein, prostate-specific antigen and cytokeratin AE1/AE3 supporting the diagnosis.  He recovered uneventfully and was set-up for outpatient consultation with me today to discuss adjuvant spinal radiotherapy.  However, he returned ot the ED on 09/25/20 with decreased appetite and black tarry stool.  On 9/12, he had upper endoscopy revealing an obstrucing mid-esophagus cancer with biopsy confirming poorly differentiated carcinoma.      On 9/12, he had body CTs showin an apple core obstructing sigmoid colon mass.      On 9/14, sigmoidoscopy confirmed a fungating sigmoid tumor at 15 cm but biopsy was non-diagnostic for malignancy.      On 9/16, the patient underwent diverting colostomy.  He remains hospitalized at this time recovering from bowel surgery.  PREVIOUS RADIATION THERAPY: Yes 05/27/2014 through 07/09/2014 giving Prostate bed 60 Gy in 30 sessions, pelvic lymph nodes 49.5 Gy in 30 sessions.  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    Prostate cancer ([facility]) 09/04/13   Adenocarcinoma  :   Past Surgical History:  Procedure Laterality Date    BIOPSY  09/26/2020   Procedure: BIOPSY;  Surgeon: Ronnette Juniper, MD;  Location: [facility];  Service: Gastroenterology;;   BIOPSY  09/28/2020   Procedure: BIOPSY;  Surgeon: Ronnette Juniper, MD;  Location: [facility];  Service: Gastroenterology;;   COLON RESECTION N/A 09/30/2020   Procedure: HAND ASSISTED LAPAROSCOPIC HARTMAN'S PROCEDURE WITH END COLOSTOMY; TAP BLOCK, PLACEMENT OF PEG TUBE;  Surgeon: Greer Pickerel, MD;  Location: [facility];  Service: General;  Laterality: N/A;   ESOPHAGOGASTRODUODENOSCOPY (EGD) WITH PROPOFOL N/A 09/26/2020   Procedure: ESOPHAGOGASTRODUODENOSCOPY (EGD) WITH PROPOFOL;  Surgeon: Ronnette Juniper, MD;  Location: [facility];  Service: Gastroenterology;  Laterality: N/A;   FLEXIBLE SIGMOIDOSCOPY N/A 09/28/2020   Procedure: FLEXIBLE SIGMOIDOSCOPY;  Surgeon: Ronnette Juniper, MD;  [facility];  Service: Gastroenterology;  Laterality: N/A;   LAMINECTOMY N/A 09/18/2020   Procedure: THORACIC LAMINECTOMY FOR TUMOR;  Surgeon: Newman Pies, MD;  Location: [facility];  Service: Neurosurgery;  Laterality: N/A;   LYMPHADENECTOMY Bilateral 09/04/2013   Procedure: PELVIC LYMPH NODE DISSECTION;  Surgeon: Alexis Frock, MD;  Location: [facility];  Service: Urology;  Laterality: Bilateral;   ROBOT ASSISTED LAPAROSCOPIC RADICAL PROSTATECTOMY N/A 09/04/2013   Procedure: ROBOTIC ASSISTED LAPAROSCOPIC RADICAL PROSTATECTOMY, INDOCYANINE GREEN DYE;  Surgeon: Alexis Frock, MD;  Location: [facility];  Service: Urology;  Laterality: N/A;   SUBMUCOSAL TATTOO INJECTION  09/28/2020   Procedure: SUBMUCOSAL TATTOO INJECTION;  Surgeon: Ronnette Juniper, MD;  Location: [facility];  Service: Gastroenterology;;   TOTAL HIP ARTHROPLASTY Right 05/15/2016   Procedure: RIGHT TOTAL HIP ARTHROPLASTY ANTERIOR APPROACH;  Surgeon: Paralee Cancel, MD;  Location: [facility];  Service: Orthopedics;  Laterality: Right;   WRIST GANGLION EXCISION    :  No current facility-administered medications for this encounter. No current outpatient  medications on file.  Facility-Administered Medications Ordered in Other Encounters:    acetaminophen (TYLENOL) tablet 1,000 mg, 1,000 mg, Oral, Q6H, Saverio Danker, PA-C, 1,000 mg at 10/02/20 0522   Chlorhexidine Gluconate Cloth 2 % PADS 6 each, 6 each, Topical, Daily, Enzo Bi, MD, 6 each at 10/04/20 1415   dextrose 5 % and 0.45 % NaCl with KCl 20 mEq/L infusion, , Intravenous, Continuous, Dwan Bolt, MD, Last Rate: 75 mL/hr at 10/04/20 1004, Rate Change at 10/04/20 1004   enoxaparin (LOVENOX) injection 40 mg, 40 mg, Subcutaneous, Q24H, Saverio Danker, PA-C, 40 mg at 10/01/20 1519   feeding supplement (BOOST / RESOURCE BREEZE) liquid 1 Container, 1 Container, Oral, TID BM, Saverio Danker, PA-C, 1 Container at 10/01/20 2118   HYDROmorphone (DILAUDID) injection 0.5 mg, 0.5 mg, Intravenous, Q2H PRN, Dwan Bolt, MD   ketorolac (TORADOL) 15 MG/ML injection 15 mg, 15 mg, Intravenous, Q6H PRN, Enzo Bi, MD, 15 mg at 10/03/20 1811   methocarbamol (ROBAXIN) 500 mg in dextrose 5 % 50 mL IVPB, 500 mg, Intravenous, Q8H, Michaelle Birks L, MD, Last Rate: 100 mL/hr at 10/04/20 1033, 500 mg at 10/04/20 1033   ondansetron (ZOFRAN) tablet 4 mg, 4 mg, Oral, Q6H PRN, 4 mg at 10/02/20 1039 **OR** ondansetron (ZOFRAN) injection 4 mg, 4 mg, Intravenous, Q6H PRN, Maxwell Caul,  Claiborne Billings, PA-C, 4 mg at 10/03/20 2993   oxyCODONE (Oxy IR/ROXICODONE) immediate release tablet 5 mg, 5 mg, Oral, Q4H PRN, Dwan Bolt, MD   pantoprazole (PROTONIX) EC tablet 40 mg, 40 mg, Oral, BID, Saverio Danker, PA-C, 40 mg at 10/02/20 1039   vitamin B-12 (CYANOCOBALAMIN) tablet 500 mcg, 500 mcg, Oral, Daily, Saverio Danker, PA-C, 500 mcg at 10/02/20 1039:  No Known Allergies:   Family History  Problem Relation Age of Onset   Seizures Mother    Cancer Father        throat cancer  :   Social History   Socioeconomic History   Marital status: Legally Separated    Spouse name: Not on file   Number of children: Not on file    Years of education: Not on file   Highest education level: Not on file  Occupational History   Occupation: retired  Tobacco Use   Smoking status: Every Day    Packs/day: 0.25    Types: Cigarettes   Smokeless tobacco: Never  Vaping Use   Vaping Use: Never used  Substance and Sexual Activity   Alcohol use: Not Currently    Comment: 2- 40 ounce beers per week   Drug use: No   Sexual activity: Not Currently  Other Topics Concern   Not on file  Social History Narrative   Not on file   Social Determinants of Health   Financial Resource Strain: Not on file  Food Insecurity: Not on file  Transportation Needs: Not on file  Physical Activity: Not on file  Stress: Not on file  Social Connections: Not on file  Intimate Partner Violence: Not on file  :  REVIEW OF SYSTEMS:  A 15 point review of systems is documented in the electronic medical record. This was obtained by the nursing staff. However, I reviewed this with the patient to discuss relevant findings and make appropriate changes.  At this time, he denies any significant pain.  He also denies dysphagia.   PHYSICAL EXAM:  Per Hospitalists Vital signs in last 24 hours: Temp:  [98.5 F (36.9 C)-99.4 F (37.4 C)] 98.5 F (36.9 C) (09/20 0532) Pulse Rate:  [78-89] 78 (09/20 0532) Resp:  [18-20] 18 (09/20 0532) BP: (126-133)/(53-66) 133/62 (09/20 0532) SpO2:  [94 %-95 %] 95 % (09/20 0532) Last BM Date: 09/28/20  Intake/Output from previous day: 09/19 0701 - 09/20 0700 In: 0  Out: 1295 [Urine:400; Drains:875; Stool:20] Intake/Output this shift: Total I/O In: -  Out: 200 [Urine:200]  PE: Gen: NAD Heart: regular Lungs: normal work of breathing Abd: soft, appropriately tender, some distention, g-tube to LIWS with bilious output.  JP drain with serosanguinous fluid.  Midline incision with staples, honeycomb in place.  Colostomy with viable and pink stoma, bowel sweat in bag.  KPS = 40  100 - Normal; no complaints; no  evidence of disease. 90   - Able to carry on normal activity; minor signs or symptoms of disease. 80   - Normal activity with effort; some signs or symptoms of disease. 79   - Cares for self; unable to carry on normal activity or to do active work. 60   - Requires occasional assistance, but is able to care for most of his personal needs. 50   - Requires considerable assistance and frequent medical care. 33   - Disabled; requires special care and assistance. 28   - Severely disabled; hospital admission is indicated although death not imminent. 20   - Very  sick; hospital admission necessary; active supportive treatment necessary. 10   - Moribund; fatal processes progressing rapidly. 0     - Dead  Karnofsky DA, Abelmann Barada, Craver LS and Burchenal [place] [phone]) The use of the nitrogen mustards in the palliative treatment of carcinoma: with particular reference to bronchogenic carcinoma Cancer 1 634-56  LABORATORY DATA:  Lab Results  Component Value Date   WBC 12.6 (H) 10/04/2020   HGB 8.5 (L) 10/04/2020   HCT 26.2 (L) 10/04/2020   MCV 77.1 (L) 10/04/2020   PLT 305 10/04/2020   Lab Results  Component Value Date   NA 134 (L) 10/04/2020   K 4.2 10/04/2020   CL 100 10/04/2020   CO2 26 10/04/2020   Lab Results  Component Value Date   ALT 26 09/25/2020   AST 20 09/25/2020   ALKPHOS 67 09/25/2020   BILITOT 0.9 09/25/2020     RADIOGRAPHY: DG Lumbar Spine 2-3 Views  Result Date: 09/18/2020 CLINICAL DATA:  Elective surgery. EXAM: LUMBAR SPINE - 2-3 VIEW COMPARISON:  Prior thoracic spine imaging. FINDINGS: Two lateral coned spot views of the spine obtained in the operating room. Pedicle screws are seen at 2 contiguous levels as well as the more superior level. Levels are not well delineated on these coned operative views. Fluoroscopy time 1 minute 6 seconds. IMPRESSION: Procedural fluoroscopy for spine surgery, recommend correlation with operative report for details. Electronically Signed   By:  Keith Rake M.D.   On: 09/18/2020 18:36   DG Abd 1 View  Result Date: 10/02/2020 CLINICAL DATA:  Postoperative Hartmann's and PEG placement. Generalized abdominal pain. EXAM: ABDOMEN - 1 VIEW COMPARISON:  CT chest abdomen and pelvis 09/26/2020. FINDINGS: There is skin staples and surgical drain in the pelvis, new from prior. There is diffuse gaseous distension of large and small bowel to the level of the rectum. There is some mildly dilated air-filled small bowel loops. There is mild elevation of the right hemidiaphragm. Free air cannot be excluded on a supine view. Peg tube overlies the upper abdomen. 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 IMPRESSION: 1. There is diffuse gaseous distension of large and small bowel suspicious for ileus. Some small bowel loops are mildly dilated and a small bowel obstruction cannot be excluded. 2. New postsurgical changes in the pelvis. Electronically Signed   By: Ronney Asters M.D.   On: 10/02/2020 15:51   MR THORACIC SPINE W WO CONTRAST  Result Date: 09/16/2020 CLINICAL DATA:  Cord compression. 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 Evaluate for bone metastases. EXAM: MRI THORACIC WITHOUT AND WITH CONTRAST TECHNIQUE: Multiplanar and multiecho pulse sequences of the thoracic spine were obtained without and with intravenous contrast. CONTRAST:  7.52mL GADAVIST GADOBUTROL 1 MMOL/ML IV SOLN COMPARISON:  CT of the thoracic spine 08/31/2020. FINDINGS: Alignment:  No significant spondylolisthesis. Vertebrae: Osseous lesions within the posterior T5 vertebral body and T5 posterior elements, as well as T6 vertebral body and posterior elements. Involvement of the T6 vertebral body and posterior elements is extensive. In addition to involvement of the right T6 transverse process, there is also involvement of the posterior right sixth rib. There is also involvement of the bilateral T7 pedicles/articular pillars, T7 spinous process,  right T7 transverse process, and subtle involvement of the posterior T7 vertebral body. There is subtle heterogeneous enhancement of the T8 spinous process, which could reflect an additional lesion (series 23, image 8). Aditionally, there is partially ossified enhancing epidural tumor within the right posterolateral aspect  of the spinal canal at the T5-T6 level. Resultant severe spinal canal stenosis with spinal cord compression. There may be subtle T2 hyperintense signal abnormality within the spinal cord at this level compatible with myelomalacia and/or focal edema (series 17, image 10). Additionally, tumor encroaches upon the right T5-T6 and T6-T7 neural foramina. Cord: Significant spinal cord compression at T5-T6 by tumor, as described above. Possible subtle T2/STIR hyperintense signal abnormality within the spinal cord at this level, which may reflect focal edema and/or myelomalacia (series 17, image 10). Paraspinal and other soft tissues: No abnormality identified within included portions of the thorax or upper abdomen/retroperitoneum. Disc levels: No more than mild disc degeneration within the thoracic spine. No significant focal disc herniation or degenerative spinal canal stenosis. Mild facet arthrosis within the lower thoracic spine. No degenerative compressive foraminal stenosis. Impressions 1 and 2 will be called to the ordering clinician or representative by the Radiologist Assistant, and communication documented in the PACS or [employer]. IMPRESSION: Osseous lesions within the T5, T6, T7 and possibly T8 vertebra, as well as right 6th rib as detailed. These findings are highly suspicious for osseous metastatic disease given the patient's history of prostate cancer. 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 Subtle signal abnormality is  questioned within the spinal cord at this level, and this may reflect focal edema and/or myelomalacia. Tumor also encroaches upon the right T5-T6 and T6-T7 neural foramina. Electronically Signed   By: Kellie Simmering D.O.   On: 09/16/2020 20:04   CT CHEST ABDOMEN PELVIS W CONTRAST  Result Date: 09/26/2020 CLINICAL DATA:  Esophageal mass, history of prostate cancer, thoracic bony metastases, epidural thoracic mass on previous MRI EXAM: CT CHEST, ABDOMEN, AND PELVIS WITH CONTRAST TECHNIQUE: Multidetector CT imaging of the chest, abdomen and pelvis was performed following the standard protocol during bolus administration of intravenous contrast. CONTRAST:  60mL OMNIPAQUE IOHEXOL 350 MG/ML SOLN COMPARISON:  09/16/2020, 08/31/2020 FINDINGS: CT CHEST FINDINGS Cardiovascular: Trace pericardial effusion. The heart is not enlarged. No evidence of thoracic aortic aneurysm or dissection. Stable atherosclerosis. Mediastinum/Nodes: Irregular mural thickening within the midthoracic esophagus at the level of the carina likely corresponds to the reported history of esophageal mass, and is suspicious for neoplasm. No pathologic adenopathy within the mediastinum, hila, or axilla. Thyroid and trachea are unremarkable. Lungs/Pleura: Hypoventilatory changes are seen at the lung bases, with trace bilateral pleural effusions. No acute airspace disease or pneumothorax. No pulmonary nodules. Musculoskeletal: Postsurgical changes are seen from interval laminectomy and posterior fusion spanning T4 through T8. Sclerotic lesions within the T5 and T6 vertebral bodies as well as the right posterior sixth rib are unchanged. There are no acute displaced fractures. Reconstructed images demonstrate no additional findings. CT ABDOMEN PELVIS FINDINGS Hepatobiliary: Within the right lobe liver image 54/2 there is a 2.9 x 2.0 cm hypodensity which has developed since prior study, highly concerning for metastatic disease. Numerous other punctate less than  3 mm hypodensities within the remainder of the liver are indeterminate. No intrahepatic duct dilation. Small calcified gallstone without evidence of acute cholecystitis. Pancreas: Unremarkable. No pancreatic ductal dilatation or surrounding inflammatory changes. Spleen: Normal in size without focal abnormality. Adrenals/Urinary Tract: Chronic thickening of the lateral limb of the left adrenal gland measuring up to 11 mm, unchanged since 2015 and consistent with adenoma or hyperplasia. The right adrenals unremarkable. Stable small left renal cyst. No urinary tract  calculi or obstructive uropathy. The bladder is unremarkable. Stomach/Bowel: There is an apple-core type lesion involving the distal sigmoid colon, with irregular circumferential wall thickening. This mass extends approximately 4.3 cm in length, and measures approximately 5.0 x 4.9 cm in cross-sectional diameter. This is highly concerning for colonic neoplasm. There is moderate retained stool within the more proximal colon, likely due to partial colonic obstruction. No evidence of small-bowel obstruction. Vascular/Lymphatic: Epiphrenic lymphadenopathy is identified, with lymph node mass anterior to the right crus of the diaphragm measuring up to 3.7 x 2.3 cm reference image 56/2. Lymphadenopathy within the gastrohepatic ligament measures up to 1.3 cm in short axis reference image 55/2. Retroperitoneal lymphadenopathy in the left para-aortic region measures up to 1.2 cm in short axis reference image 67/2. Numerous other enlarged retroperitoneal lymph nodes are seen within the aortocaval region as well. Findings are consistent with nodal metastases. There is diffuse atherosclerosis of the aorta. There is high-grade greater than 90% stenosis within the left common iliac artery. Moderate stenosis is seen within the right common iliac artery estimated 50%. Reproductive: Prostate is surgically absent. Other: Trace free fluid within the pelvis. No free  intraperitoneal gas. No abdominal wall hernia. Musculoskeletal: Unremarkable right hip arthroplasty. 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 No acute or destructive bony lesions. Reconstructed images demonstrate no additional findings. IMPRESSION: 1. Irregular circumferential wall thickening within the midthoracic esophagus consistent with the esophageal mass described in history. Findings are highly suspicious for neoplasm. 2. Circumferential apple-core type mass involving the distal sigmoid colon compatible with colonic malignancy. Colonoscopy recommended if not previously performed. There is an element of partial colonic obstruction with significant proximal colonic dilation and fecal retention. 3. Extensive lymphadenopathy within the abdomen and pelvis as above, largest lymph node mass in the upper phrenic region, consistent with nodal metastases. 4. Hypodensities within the liver, largest in the right lobe suspicious for metastatic disease. 5. Trace bilateral pleural effusions. 6. Postsurgical changes from recent thoracic fusion and laminectomy. 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 Please refer to previous MRI discussion. 7. 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 8. 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 Electronically Signed   By: Randa Ngo M.D.   On: 09/26/2020 17:56   DG C-Arm 1-60 Min-No Report  Result Date: 09/18/2020 Fluoroscopy was utilized by the requesting physician.  No radiographic interpretation.      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  This is a very complex situation in a gentleman with an apparent 3 synchronous malignancies.    - It is known that the prostate cancer is stage IV and likely castration sensitive, so, with the initiation of androgen deprivation therapy, prolonged disease control is possible for several years.  - The stage of his esophagus cancer and presumed sigmoid colon cancer are unknown.  This distinction and staging may require FDG-PET and also PSMA-PET to distinguish enlarged lymph nodes from prostate cancer.  So, prognosis of these malignancies is difficult to determine with neither staging nor molecular pathology.  It may be important in this complex case to methodically gather information, before finalizing a treatment plan.  PLAN:Today, I talked to the patient and family about the findings and work-up thus far.  We discussed the natural history of stage IV prostate cancer, esophagus cancer and sigmoid colon cancer and general treatment, highlighting the role of radiotherapy in the management.  We discussed the available radiation techniques, and focused on the details of logistics and delivery.  We reviewed the anticipated acute and late sequelae associated with radiation in this setting.    At this point, I would endorse initiation of androgen deprivation therapy (urology consult) and continued recovery from recent surgery.  If the patient is stable for discharge home with home health in place, additional outpatient evaluation should be considered with cancer staging and molecular pathology.  At some point, he will likely benefit from adjuvant conventional radiation to his thoracic spine to prevent recurrent spinal cord compression, with likely radiation to the immediately adjacent esophageal primary tumor to prevent esophageal obstruction.  In the outpatient setting, it would also potentially be appropriate for genetic counselor referral given personal history.  I personally spent 75 minutes in  this encounter including chart review, reviewing radiological studies, meeting face-to-face with the patient, entering orders and completing documentation.    ------------------------------------------------   Tyler Pita, MD [place] Director and Director of Stereotactic Radiosurgery Direct Dial: [phone]  Fax: [phone] [email or web address]  Skype  LinkedIn

## 2020-10-04 NOTE — Progress Notes (Signed)
Occupational Therapy Treatment Patient Details Name: Marcus [name] MRN: [id number] DOB: 1946/01/08 Today's Date: 10/04/2020   History of present illness 75 yo male admitted with GI bleed, anemia. 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 Arnoldo Morale on 9/4,  R THA, S/P colostomy 09/30/20   OT comments  Patient was in bed with reports of pain above peg tube site. Patient agreed to participate in tasks. Patient needed re education on sequencing for supine to sit. Patient was extensively educated on importance of moving to maintain strength with staff. Patient verbalized understanding. Patient was educated on using AE for LB dressing with patient able to demonstrate understanding with min cues to complete appropriately. Patient was noted to be down after MD had visited him with chaplin services paged at patients request to visit tomorrow am. Patient's discharge plan remains appropriate at this time. OT will continue to follow acutely.     Recommendations for follow up therapy are one component of a multi-disciplinary discharge planning process, led by the attending physician.  Recommendations may be updated based on patient status, additional functional criteria and insurance authorization.    Follow Up Recommendations  SNF    Equipment Recommendations       Recommendations for Other Services      Precautions / Restrictions Precautions Precautions: Fall;Back;Other (comment) Precaution Booklet Issued: Yes (comment) Precaution Comments: now has colostomy, cannot wear brace, R JP Required Braces or Orthoses: Spinal Brace Spinal Brace: Thoracolumbosacral orthotic;Applied in sitting position Restrictions Weight Bearing Restrictions: No       Mobility Bed Mobility Overal bed mobility: Needs Assistance Bed Mobility: Supine to Sit Rolling: Min guard Sidelying to sit: Min assist;HOB  elevated     Sit to sidelying: Min guard General bed mobility comments: verbal and manual cues for log roll technique, then min A to raise trunk; increased time 2*  pain at ostomy site    Transfers Overall transfer level: Needs assistance Equipment used: Rolling walker (2 wheeled) Transfers: Sit to/from Stand Sit to Stand: Min guard         General transfer comment: with increased time and education on proper hand palcement    Balance                                           ADL either performed or assessed with clinical judgement   ADL Overall ADL's : Needs assistance/impaired                     Lower Body Dressing: Minimal assistance;With adaptive equipment Lower Body Dressing Details (indicate cue type and reason): patient was educated on using sock aid to don socks sitting on edge of bed with min cues to use AE appropriately.               General ADL Comments: patient took small steps to head of bed but declined to participate in functional mobility in room or hallway stating he did it already today. patient was reeducated on importance of walking multiple times each day.     Vision       Perception     Praxis      Cognition Arousal/Alertness: Awake/alert Behavior During Therapy: WFL for tasks assessed/performed Overall Cognitive Status: Within Functional Limits for tasks assessed  General Comments: reviewed log roll technique/back precautions        Exercises General Exercises - Lower Extremity Ankle Circles/Pumps: AROM;Both;10 reps;Seated Long Arc Quad: AROM;Both;10 reps;Seated   Shoulder Instructions       General Comments      Pertinent Vitals/ Pain       Pain Assessment: Faces Pain Score: 9  Faces Pain Scale: Hurts whole lot Pain Location: colostomy site Pain Descriptors / Indicators: Discomfort;Grimacing;Sore Pain Intervention(s): Limited activity within  patient's tolerance;Monitored during session;Repositioned  Home Living                                          Prior Functioning/Environment              Frequency  Min 2X/week        Progress Toward Goals  OT Goals(current goals can now be found in the care plan section)  Progress towards OT goals: Progressing toward goals  Acute Rehab OT Goals Patient Stated Goal: less pain, regain IND  Plan Discharge plan needs to be updated;Frequency remains appropriate    Co-evaluation                 AM-PAC OT "6 Clicks" Daily Activity     Outcome Measure   Help from another person eating meals?: None Help from another person taking care of personal grooming?: A Little Help from another person toileting, which includes using toliet, bedpan, or urinal?: A Little Help from another person bathing (including washing, rinsing, drying)?: A Little Help from another person to put on and taking off regular upper body clothing?: A Little Help from another person to put on and taking off regular lower body clothing?: A Lot 6 Click Score: 18    End of Session Equipment Utilized During Treatment: Rolling walker  OT Visit Diagnosis: Unsteadiness on feet (R26.81);Other abnormalities of gait and mobility (R26.89);Muscle weakness (generalized) (M62.81);Pain Pain - Right/Left: Left   Activity Tolerance Patient tolerated treatment well   Patient Left in bed;with call bell/phone within reach;with bed alarm set   Nurse Communication Other (comment) (nurse cleared patient to participate and was educated on patients pain and hiccuping)        Time: 1423-1450 OT Time Calculation (min): 27 min  Charges: OT General Charges $OT Visit: 1 Visit OT Treatments $Self Care/Home Management : 23-37 mins  Jackelyn Poling OTR/L, [facility] Acute Rehabilitation Department Office# [phone] Pager# [phone]   [place] 10/04/2020, 3:09 PM

## 2020-10-04 NOTE — Progress Notes (Signed)
Pt alert and oriented x4 with periods of confusion. Pt asked if I saw rabbits in his room. Fall precautions maintained. Call bell within reach. Bed is in the lowest position. Pain well-controlled. No acute events overnight.

## 2020-10-04 NOTE — NC FL2 (Addendum)
[place] LEVEL OF CARE SCREENING TOOL     IDENTIFICATION  Patient Name: Marcus [name] Birthdate: [birth year] Sex: male Admission Date (Current Location): 09/25/2020  [facility] and [place] Number:  Herbalist and Address:  [facility],  [address] [place], [place]      Provider Number: [id number]  Attending Physician Name and Address:  Enzo Bi, MD  Relative Name and Phone Number:  brother-in-law, Macky Lower [phone]    Current Level of Care: Hospital Recommended Level of Care: [place] Prior Approval Number:    Date Approved/Denied:   PASRR Number: [id number]  Discharge Plan: SNF    Current Diagnoses: Patient Active Problem List   Diagnosis Date Noted   Esophageal mass    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 biopsy    Goals of care, counseling/discussion    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[place]) 09/27/2020   GI bleed 09/25/2020   Bone metastases ([place])    Cord compression [facility])    Thoracic spine tumor 09/17/2020   Hyponatremia 09/14/2018   Hypokalemia 09/14/2018   ARF (acute renal failure) ([place]) 09/14/2018   Acute lower UTI 09/14/2018   S/P right THA, 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   Prostate cancer ([place]) 09/04/2013    Orientation RESPIRATION BLADDER Height & Weight     Self, Time, Situation, Place  Normal Continent Weight: 164 lb 14.5 oz (74.8 kg) Height:  5\' 10"  (177.8 cm)  BEHAVIORAL SYMPTOMS/MOOD NEUROLOGICAL BOWEL NUTRITION STATUS      Colostomy Feeding tube  AMBULATORY STATUS COMMUNICATION OF NEEDS Skin   Limited Assist    (incisional wounds)                       Personal Care Assistance Level of Assistance  Bathing, Dressing Bathing Assistance: Limited assistance   Dressing Assistance: Limited assistance     Functional Limitations Info             [place]  PT (By licensed PT), OT (By licensed OT)     PT Frequency: 5x/wk OT  Frequency: 5x/wk            Contractures Contractures Info: Not present    Additional Factors Info  Code Status, Allergies Code Status Info: Full Allergies Info: NKDA           Current Medications (10/04/2020):  This is the current hospital active medication list Current Facility-Administered Medications  Medication Dose Route Frequency Provider Last Rate Last Admin   acetaminophen (TYLENOL) tablet 1,000 mg  1,000 mg Oral Q6H Saverio Danker, PA-C   1,000 mg at 10/02/20 0522   Chlorhexidine Gluconate Cloth 2 % PADS 6 each  6 each Topical Daily Enzo Bi, MD   6 each at 10/04/20 1415   dextrose 5 % and 0.45 % NaCl with KCl 20 mEq/L infusion   Intravenous Continuous Dwan Bolt, MD 75 mL/hr at 10/04/20 1004 Rate Change at 10/04/20 1004   enoxaparin (LOVENOX) injection 40 mg  40 mg Subcutaneous Q24H Saverio Danker, PA-C   40 mg at 10/01/20 1519   feeding supplement (BOOST / RESOURCE BREEZE) liquid 1 Container  1 Container Oral TID BM Saverio Danker, PA-C   1 Container at 10/01/20 2118   HYDROmorphone (DILAUDID) injection 0.5 mg  0.5 mg Intravenous Q2H PRN Dwan Bolt, MD       ketorolac (TORADOL) 15 MG/ML injection 15 mg  15 mg Intravenous Q6H PRN Billie Ruddy,  Otila Kluver, MD   15 mg at 10/03/20 1811   methocarbamol (ROBAXIN) 500 mg in dextrose 5 % 50 mL IVPB  500 mg Intravenous Q8H Michaelle Birks L, MD 100 mL/hr at 10/04/20 1033 500 mg at 10/04/20 1033   ondansetron (ZOFRAN) tablet 4 mg  4 mg Oral Q6H PRN Saverio Danker, PA-C   4 mg at 10/02/20 1039   Or   ondansetron (ZOFRAN) injection 4 mg  4 mg Intravenous Q6H PRN Saverio Danker, PA-C   4 mg at 10/03/20 7092   oxyCODONE (Oxy IR/ROXICODONE) immediate release tablet 5 mg  5 mg Oral Q4H PRN Dwan Bolt, MD       pantoprazole (PROTONIX) EC tablet 40 mg  40 mg Oral BID Saverio Danker, PA-C   40 mg at 10/02/20 1039   vitamin B-12 (CYANOCOBALAMIN) tablet 500 mcg  500 mcg Oral Daily Saverio Danker, PA-C   500 mcg at 10/02/20 1039      Discharge Medications: Please see discharge summary for a list of discharge medications.  Relevant Imaging Results:  Relevant Lab Results:   Additional Information SS# [id number]; pt confirms COVID vaccine x 2 but does not believe he has had any boosters  Shyheim Tanney, LCSW

## 2020-10-04 NOTE — Progress Notes (Addendum)
Progress Note    Marcus [name]  GMW:[id number] DOB: 19-Dec-1945  DOA: 09/25/2020 PCP: [name], MD    Brief Narrative:    Medical records reviewed and are as summarized below:  Marcus [name] is an 75 y.o. 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 Arnoldo Morale on 9/4 and anemia returning with upper back pain, decreased appetite and black stool.  EGD 9/12- mass biopsied.  CT scan of abdomen also shows possible mass.  Oncology consult.    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  PEG tube connected to intermittent suction on 9/18. Plan: --switch to gravity drain, per GenSurg --NPO for now except for ice chips for comfort --cont MIVF@75  --anti-emetics and IV toradol PRN for pain  Symptomatic microcytic anemia 2/2 GI bleed, Iron def and Vit B12 def -EGD 9/12- mass biopsied: Poorly differentiated carcinoma -CT scan of abdomen also shows mass- colonoscopy today found sigmoid obstructing mass --Hgb 8's -s/p IM Vit B12 x 3 days, and IV iron x3 days Plan: --Monitor Hgb and transfuse if Hgb <7  Sigmoid obstructing mass S/p Hartman's procedure with end colostomy on 9/16 --currently having post-op ileus --follow colostomy output to monitor return of bowel function --need further outpatient workup for staging  Esophageal mass  S/p PEG tube placement on 9/16 --between 25 to 30 cm compatible with poorly differentiated carcinoma, immunophenotype consistent with squamous cell  carcinoma. Plan: --management by oncology --rad onc to consider radiation to the immediately adjacent esophageal primary tumor to prevent esophageal obstruction, likely as outpatient  current, metastatic prostate cancer Postsurgical back pain --History of prostate cancer with osseous mets s/p radical prostatectomy and radiation in 2015.   --Thoracic spine tumor with cord compression s/p T5-6 laminectomy with posterior lateral arthrodesis from T4-T8 by Dr. Arnoldo Morale on 09/18/20.   Plan: --consult urology for androgen deprivation therapy, per rad onc recommendation  -Outpatient follow-up with neurosurgery   Pyuria/bacteriuria -patient denies UTI symptoms, fever, chills, suprapubic pain or lower back pain. -No indication for treatment.  Hypokalemia --monitor and replete PRN  Hyponatremia, improved --monitor with daily labs  Vitamin D def -will need replacement after procedures   DVT prophylaxis: Lovenox SQ Code Status: Full code  Family Communication: brother in law updated at bedside today Status is: inpatient Dispo:    Dispo: The patient is from: Home              Anticipated d/c is to: SNF rehab               Patient currently is not medically stable to d/c.  No bowel function yet.  Post-op ileus     Difficult to place patient No   Medical Consultants:   GI oncology  Subjective:   Marcus [name] noted some hallucination.  Marcus reported feeling better today.  No N/V.  Pain controlled with IV toradol.    GenSurg changed PEG to gravity drain.   Objective:    Vitals:   10/03/20 1335 10/03/20 1958 10/04/20 0532  10/04/20 1257  BP: 129/66 (!) 126/53 133/62 (!) 144/63  Pulse: 89 86 78 73  Resp: 20 20 18 20   Temp: 99.2 F (37.3 C) 99.4 F (37.4 C) 98.5 F (36.9 C) 99 F (37.2 C)  TempSrc: Oral Oral Oral Oral  SpO2: 94% 94% 95% 95%  Weight:      Height:        Intake/Output Summary (Last 24 hours) at 10/04/2020 1654 Last data filed at 10/04/2020 0713 Gross per 24  hour  Intake 0 ml  Output 715 ml  Net -715 ml   Filed Weights   09/25/20 0617 09/26/20 1245 09/28/20 0800  Weight: 70.7 kg 74.8 kg 74.8 kg    Exam:  Constitutional: NAD, AAOx3 HEENT: conjunctivae and lids normal, EOMI CV: No cyanosis.   RESP: normal respiratory effort, on RA GI: no stool in ostomy bag, PEG to gravity drain with less dark green output Extremities: No effusions, edema in BLE SKIN: warm, dry Neuro: II - XII grossly intact.   Psych: Normal mood and affect.  Appropriate judgement and reason    Data Reviewed:   I have personally reviewed following labs and imaging studies:  Labs: Labs show the following:   Basic Metabolic Panel: Recent Labs  Lab 09/30/20 0444 10/01/20 0539 10/02/20 0539 10/03/20 0442 10/04/20 0507  NA 133* 131* 131* 132* 134*  K 3.3* 3.9 3.9 3.9 4.2  CL 104 101 99 99 100  CO2 21* 22 25 25 26   GLUCOSE 106* 169* 180* 141* 120*  BUN <5* <5* <5* <5* 7*  CREATININE 0.80 0.84 0.80 0.67 0.85  CALCIUM 8.2* 8.1* 8.0* 8.6* 8.4*  MG 1.9 2.0 2.1 2.0 2.0   GFR Estimated Creatinine Clearance: 81.1 mL/min (by C-G formula based on SCr of 0.85 mg/dL). Liver Function Tests: No results for input(s): AST, ALT, ALKPHOS, BILITOT, PROT, ALBUMIN in the last 168 hours.  No results for input(s): LIPASE, AMYLASE in the last 168 hours. No results for input(s): AMMONIA in the last 168 hours. Coagulation profile No results for input(s): INR, PROTIME in the last 168 hours.   CBC: Recent Labs  Lab 09/30/20 0444 10/01/20 0539 10/02/20 0539 10/03/20 0442 10/04/20 0507  WBC 7.6 10.6* 6.2 7.5 12.6*  HGB 8.2* 8.3* 8.6* 9.5* 8.5*  HCT 24.4* 25.5* 25.7* 29.0* 26.2*  MCV 75.1* 75.0* 75.8* 75.7* 77.1*  PLT 238 250 245 296 305   Cardiac Enzymes: No results for input(s): CKTOTAL, CKMB, CKMBINDEX, TROPONINI in the last 168 hours. BNP (last 3 results) No results for input(s): PROBNP in the last 8760 hours. CBG: Recent Labs  Lab 09/30/20 2032  GLUCAP  138*   D-Dimer: No results for input(s): DDIMER in the last 72 hours. Hgb A1c: Recent Labs    10/02/20 0539  HGBA1C 6.5*   Lipid Profile: No results for input(s): CHOL, HDL, LDLCALC, TRIG, CHOLHDL, LDLDIRECT in the last 72 hours. Thyroid function studies: No results for input(s): TSH, T4TOTAL, T3FREE, THYROIDAB in the last 72 hours.  Invalid input(s): FREET3 Anemia work up: No results for input(s): VITAMINB12, FOLATE, FERRITIN, TIBC, IRON, RETICCTPCT in the last 72 hours.  Sepsis Labs: Recent Labs  Lab 10/01/20 0539 10/02/20 0539 10/03/20 0442 10/04/20 0507  WBC 10.6* 6.2 7.5 12.6*    Microbiology No results found for this or any previous visit (from the past 240 hour(s)).   Procedures and diagnostic studies:  No results found.  Medications:    acetaminophen  1,000 mg Oral Q6H   Chlorhexidine Gluconate Cloth  6 each Topical Daily   enoxaparin (LOVENOX) injection  40 mg Subcutaneous Q24H   feeding supplement  1 Container Oral TID BM   pantoprazole  40 mg Oral BID   vitamin B-12  500 mcg Oral Daily   Continuous Infusions:  dextrose 5 % and 0.45 % NaCl with KCl 20 mEq/L 75 mL/hr at 10/04/20 1004   methocarbamol (ROBAXIN) IV 500 mg (10/04/20 1532)     LOS: 7 days   Enzo Bi  [facility]   10/04/2020, 4:54 PM

## 2020-10-04 NOTE — TOC Progression Note (Addendum)
Transition of Care (TOC) - Progression Note    Patient Details  Name: Marcus [name] MRN: [id number] Date of Birth: 05/09/1945  Transition of Care (TOC) CM/SW Contact  HOYLE, LUCY, LCSW Phone Number: 10/04/2020, 1:30 PM  Clinical Narrative:     Met with pt and brother-in-law this afternoon to discuss PT recommendations for SNF.  Both are completely agreeable with this plan and request [place] facility.  Will begin bed search and ins authorization process when MD says medically ready for this transition.  Need to clarify if any onc follow up will be planned as this may affect which facilities will consider pt.    Barriers to Discharge: No Barriers Identified  Expected Discharge Plan and Services                                                 Social Determinants of Health (SDOH) Interventions    Readmission Risk Interventions Readmission Risk Prevention Plan 09/30/2020  Transportation Screening Complete  PCP or Specialist Appt within 5-7 Days Complete  Home Care Screening Complete  Medication Review (RN CM) Complete  Some recent data might be hidden    

## 2020-10-04 NOTE — Progress Notes (Signed)
Physical Therapy Treatment Patient Details Name: Marcus [name] MRN: [id number] DOB: 01-17-1945 Today's Date: 10/04/2020   History of Present Illness 75 yo male admitted with GI bleed, anemia. 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 Arnoldo Morale on 9/4,  R THA, S/P colostomy 09/30/20    PT Comments    Min A for bed mobility, reviewed log roll technique. Pt tolerated increased ambulation distance of 57' with RW, distance limited by 9/10 pain at colostomy site.  Performed seated BLE exercises for strengthening.     Recommendations for follow up therapy are one component of a multi-disciplinary discharge planning process, led by the attending physician.  Recommendations may be updated based on patient status, additional functional criteria and insurance authorization.  Follow Up Recommendations  SNF     Equipment Recommendations  None recommended by PT    Recommendations for Other Services       Precautions / Restrictions Precautions Precautions: Fall;Back;Other (comment) Precaution Booklet Issued: Yes (comment) Precaution Comments: now has colostomy, cannot wear brace, R JP Required Braces or Orthoses: Spinal Brace Spinal Brace: Thoracolumbosacral orthotic;Applied in sitting position     Mobility  Bed Mobility Overal bed mobility: Needs Assistance   Rolling: Min guard Sidelying to sit: Min assist;HOB elevated       General bed mobility comments: verbal and manual cues for log roll technique, then min A to raise trunk; increased time 2* severe pain at ostomy site    Transfers Overall transfer level: Needs assistance Equipment used: Rolling walker (2 wheeled) Transfers: Sit to/from Stand Sit to Stand: Min guard         General transfer comment: increased time, painful  Ambulation/Gait Ambulation/Gait assistance: Supervision;Min guard Gait Distance (Feet):  56 Feet Assistive device: Rolling walker (2 wheeled) Gait Pattern/deviations: Step-through pattern;Trunk flexed Gait velocity: Decreased   General Gait Details: initially more forward flexed  upper body, improved with distance, distance limited by pain   Stairs             Wheelchair Mobility    Modified Rankin (Stroke Patients Only)       Balance                                            Cognition Arousal/Alertness: Awake/alert Behavior During Therapy: WFL for tasks assessed/performed Overall Cognitive Status: Within Functional Limits for tasks assessed                                 General Comments: reviewed log roll technique/back precautions      Exercises  Long arc quads x 10 both AROM seated Ankle pumps x 10 both AROM seated Attempted marching in sitting but too painful.     General Comments        Pertinent Vitals/Pain Pain Score: 9  Pain Location: colostomy site with mobility Pain Descriptors / Indicators: Discomfort;Grimacing;Sore Pain Intervention(s): Limited activity within patient's tolerance;Monitored during session;Premedicated before session;Repositioned    Home Living                      Prior Function            PT Goals (current goals can now be found in the care  plan section) Acute Rehab PT Goals Patient Stated Goal: less pain, regain IND PT Goal Formulation: With patient Time For Goal Achievement: 10/10/20 Potential to Achieve Goals: Good Progress towards PT goals: Progressing toward goals    Frequency    Min 2X/week      PT Plan Current plan remains appropriate    Co-evaluation              AM-PAC PT "6 Clicks" Mobility   Outcome Measure  Help needed turning from your back to your side while in a flat bed without using bedrails?: A Little Help needed moving from lying on your back to sitting on the side of a flat bed without using bedrails?: A Lot Help needed  moving to and from a bed to a chair (including a wheelchair)?: A Little Help needed standing up from a chair using your arms (e.g., wheelchair or bedside chair)?: A Little Help needed to walk in hospital room?: A Little Help needed climbing 3-5 steps with a railing? : A Lot 6 Click Score: 16    End of Session Equipment Utilized During Treatment: Gait belt Activity Tolerance: Patient tolerated treatment well;Patient limited by pain Patient left: with call bell/phone within reach;in bed;with bed alarm set Nurse Communication: Mobility status PT Visit Diagnosis: Unsteadiness on feet (R26.81);Muscle weakness (generalized) (M62.81);Difficulty in walking, not elsewhere classified (R26.2);Pain     Time: 1043-1101 PT Time Calculation (min) (ACUTE ONLY): 18 min  Charges:  $Gait Training: 8-22 mins                     Blondell Reveal Kistler PT 10/04/2020  Acute Rehabilitation Services Pager [phone] Office [phone]

## 2020-10-04 NOTE — Progress Notes (Signed)
4 Days Post-Op  Subjective: Continues to endorse nausea. G tube remains on suction, output 450 and bilious. Patient did not walk any yesterday.   Objective: Vital signs in last 24 hours: Temp:  [98.5 F (36.9 C)-99.4 F (37.4 C)] 98.5 F (36.9 C) (09/20 0532) Pulse Rate:  [78-89] 78 (09/20 0532) Resp:  [18-20] 18 (09/20 0532) BP: (126-133)/(53-66) 133/62 (09/20 0532) SpO2:  [94 %-95 %] 95 % (09/20 0532) Last BM Date: 09/28/20  Intake/Output from previous day: 09/19 0701 - 09/20 0700 In: 0  Out: 1295 [Urine:400; Drains:875; Stool:20] Intake/Output this shift: Total I/O In: -  Out: 200 [Urine:200]  PE: Gen: NAD Heart: regular Lungs: normal work of breathing Abd: soft, appropriately tender, some distention, g-tube to LIWS with bilious output.  JP drain with serosanguinous fluid.  Midline incision with staples, honeycomb in place.  Colostomy with viable and pink stoma, bowel sweat in bag.  Lab Results:  Recent Labs    10/03/20 0442 10/04/20 0507  WBC 7.5 12.6*  HGB 9.5* 8.5*  HCT 29.0* 26.2*  PLT 296 305   BMET Recent Labs    10/03/20 0442 10/04/20 0507  NA 132* 134*  K 3.9 4.2  CL 99 100  CO2 25 26  GLUCOSE 141* 120*  BUN <5* 7*  CREATININE 0.67 0.85  CALCIUM 8.6* 8.4*   PT/INR No results for input(s): LABPROT, INR in the last 72 hours. CMP     Component Value Date/Time   NA 134 (L) 10/04/2020 0507   K 4.2 10/04/2020 0507   CL 100 10/04/2020 0507   CO2 26 10/04/2020 0507   GLUCOSE 120 (H) 10/04/2020 0507   BUN 7 (L) 10/04/2020 0507   CREATININE 0.85 10/04/2020 0507   CREATININE 1.01 09/15/2020 1415   CALCIUM 8.4 (L) 10/04/2020 0507   PROT 6.3 (L) 09/25/2020 1304   ALBUMIN 3.1 (L) 09/25/2020 1304   AST 20 09/25/2020 1304   AST 13 (L) 09/15/2020 1415   ALT 26 09/25/2020 1304   ALT 11 09/15/2020 1415   ALKPHOS 67 09/25/2020 1304   BILITOT 0.9 09/25/2020 1304   BILITOT 0.7 09/15/2020 1415   GFRNONAA >60 10/04/2020 0507   GFRNONAA >60  09/15/2020 1415   GFRAA >60 09/17/2018 0607   Lipase     Component Value Date/Time   LIPASE 36 09/14/2018 1838       Studies/Results: DG Abd 1 View  Result Date: 10/02/2020 CLINICAL DATA:  Postoperative Hartmann's and PEG placement. Generalized abdominal pain. EXAM: ABDOMEN - 1 VIEW COMPARISON:  CT chest abdomen and pelvis 09/26/2020. FINDINGS: There is skin staples and surgical drain in the pelvis, new from prior. There is diffuse gaseous distension of large and small bowel to the level of the rectum. There is some mildly dilated air-filled small bowel loops. There is mild elevation of the right hemidiaphragm. Free air cannot be excluded on a supine view. Peg tube overlies the upper abdomen. 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 IMPRESSION: 1. There is diffuse gaseous distension of large and small bowel suspicious for ileus. Some small bowel loops are mildly dilated and a small bowel obstruction cannot be excluded. 2. New postsurgical changes in the pelvis. Electronically Signed   By: Ronney Asters M.D.   On: 10/02/2020 15:51    Anti-infectives: Anti-infectives (From admission, onward)    Start     Dose/Rate Route Frequency Ordered Stop   09/30/20 0900  cefoTEtan (CEFOTAN) 2 g in sodium chloride 0.9 %  100 mL IVPB        2 g 200 mL/hr over 30 Minutes Intravenous On call to O.R. 09/30/20 0800 09/30/20 1421        Assessment/Plan POD 3, s/p lap assisted Hartmann's with g-tube placement by Dr. Redmond Pulling 09/30/20 for Obstructing sigmoid colon mass -Place G tube to gravity, should not be on suction as there is no sump port. Awaiting return of bowel function, anticipate ileus. 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 Arnoldo Morale   LOS: 7 days    Dwan Bolt , [facility] 10/04/2020, 7:50 AM Please see Amion for pager number during day hours 7:00am-4:30pm or 7:00am -11:30am on weekends

## 2020-10-05 DIAGNOSIS — K2289 Other specified disease of esophagus: Secondary | ICD-10-CM | POA: Diagnosis not present

## 2020-10-05 DIAGNOSIS — C187 Malignant neoplasm of sigmoid colon: Secondary | ICD-10-CM | POA: Diagnosis not present

## 2020-10-05 DIAGNOSIS — C61 Malignant neoplasm of prostate: Secondary | ICD-10-CM | POA: Diagnosis not present

## 2020-10-05 DIAGNOSIS — E871 Hypo-osmolality and hyponatremia: Secondary | ICD-10-CM | POA: Diagnosis not present

## 2020-10-05 LAB — BASIC METABOLIC PANEL
Anion gap: 9 (ref 5–15)
BUN: 6 mg/dL — ABNORMAL LOW (ref 8–23)
CO2: 22 mmol/L (ref 22–32)
Calcium: 8.3 mg/dL — ABNORMAL LOW (ref 8.9–10.3)
Chloride: 99 mmol/L (ref 98–111)
Creatinine, Ser: 0.67 mg/dL (ref 0.61–1.24)
GFR, Estimated: >60 mL/min (ref >60)
Glucose, Bld: 114 mg/dL — ABNORMAL HIGH (ref 70–99)
Potassium: 4.1 mmol/L (ref 3.5–5.1)
Sodium: 130 mmol/L — ABNORMAL LOW (ref 135–145)

## 2020-10-05 LAB — CBC
HCT: 27.7 % — ABNORMAL LOW (ref 39.0–52.0)
Hemoglobin: 8.8 g/dL — ABNORMAL LOW (ref 13.0–17.0)
MCH: 24.9 pg — ABNORMAL LOW (ref 26.0–34.0)
MCHC: 31.8 g/dL (ref 30.0–36.0)
MCV: 78.2 fL — ABNORMAL LOW (ref 80.0–100.0)
Platelets: 299 10*3/uL (ref 150–400)
RBC: 3.54 MIL/uL — ABNORMAL LOW (ref 4.22–5.81)
RDW: 19.7 % — ABNORMAL HIGH (ref 11.5–15.5)
WBC: 9.9 10*3/uL (ref 4.0–10.5)
nRBC: 0 % (ref 0.0–0.2)

## 2020-10-05 LAB — MAGNESIUM: Magnesium: 2 mg/dL (ref 1.7–2.4)

## 2020-10-05 NOTE — Progress Notes (Signed)
Patient ID: Marcus [name], male   DOB: 1945-05-27, 75 y.o.   MRN: [id number]  PROGRESS NOTE    Marcus [name]  HCW:[id number] DOB: 07-15-1945 DOA: 09/25/2020 PCP: [name], MD   Brief Narrative:  75 y.o. 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 Arnoldo Morale on 09/18/20 and anemia presented with upper back pain, decreased appetite and black stool.  EGD showed esophageal mass; biopsy consistent with poorly differentiated carcinoma.  CT of the abdomen showed possible mass; colonoscopy showed sigmoid obstructing mass.  He underwent Hartman's procedure with end colostomy on 09/30/2020.  Oncology and radiation oncology were consulted.  Assessment & Plan:   Obstructing sigmoid colon mass status post Hartman's procedure with end colostomy and G-tube placement on 09/30/2020 Postop ileus -General surgery following.  Wound care and diet as per general surgery recommendations. -Continue IV fluids  New diagnosis of poorly differentiated esophageal carcinoma -Status post EGD on 09/26/2020. -Oncology and radiation oncology following.  Outpatient follow-up with radiation oncology. -Patient will need port placement prior to discharge as per oncology -Palliative care had evaluated the patient during this hospitalization.  Patient remains full code at this time  Metastatic prostate cancer Postsurgical back pain -S/p radical prostatectomy and radiation in 2015 -Thoracic spine tumor with cord compression status post T5-6 laminectomy with posterior lateral arthrodesis from T4-T8 by Dr. Arnoldo Morale on 09/18/2020 -Radiation oncology is recommending urology consult for androgen deprivation therapy -Outpatient follow-up with neurosurgery  Pyuria/bacteriuria -Currently being monitored off  antibiotics  Leukocytosis -Resolved  Anemia of chronic disease -From cancer.  Hemoglobin stable  Hyponatremia -Mild.  Monitor  Generalized deconditioning -Will eventually need SNF placement  DVT prophylaxis: Lovenox Code Status: Full Family Communication: None at bedside Disposition Plan: Status is: Inpatient  Remains inpatient appropriate because:Inpatient level of care appropriate due to severity of illness  Dispo: The patient is from: Home              Anticipated d/c is to: SNF              Patient currently is not medically stable to d/c.   Difficult to place patient No  Consultants: GI/general surgery/oncology/radiation oncology/palliative care  Procedures: EGD/colonoscopy Hartman's procedure with end colostomy and G-tube placement on 09/30/2020  Antimicrobials:  Anti-infectives (From admission, onward)    Start     Dose/Rate Route Frequency Ordered Stop   09/30/20 0900  cefoTEtan (CEFOTAN) 2 g in sodium chloride 0.9 % 100 mL IVPB        2 g 200 mL/hr over 30 Minutes Intravenous On call to O.R. 09/30/20 0800 09/30/20 1421        Subjective: Patient seen and examined at bedside.  Denies worsening abdominal pain, nausea or vomiting.  No overnight fever reported.  Objective: Vitals:   10/04/20 0532 10/04/20 1257 10/04/20 2016 10/05/20 0450  BP: 133/62 (!) 144/63 (!) 151/65 134/65  Pulse: 78 73 77 73  Resp: 18 20 20 20   Temp: 98.5 F (36.9 C) 99 F (37.2 C) 99.1 F (37.3 C) 98.7 F (37.1 C)  TempSrc: Oral Oral Oral Oral  SpO2: 95% 95% 97% 96%  Weight:      Height:        Intake/Output Summary (Last 24 hours) at 10/05/2020 1246 Last data filed at 10/05/2020 0730 Gross per 24 hour  Intake 0 ml  Output 590 ml  Net -590 ml   Filed Weights   09/25/20 0617 09/26/20 1245 09/28/20 0800  Weight: 70.7 kg 74.8 kg 74.8 kg    Examination:  General exam: Appears calm and comfortable.  Looks chronically ill and deconditioned.  Currently on room  air. Respiratory system: Bilateral decreased breath sounds at bases Cardiovascular system: S1 & S2 heard, Rate controlled Gastrointestinal system: Abdomen is distended, soft and mildly tender.  G-tube and colostomy bag present.  Normal bowel sounds heard. Extremities: No cyanosis, clubbing, edema  Central nervous system: Awake, slow to respond.  Poor historian.  No focal neurological deficits. Moving extremities Skin: No rashes, lesions or ulcers Psychiatry: Affect is mostly flat.   Data Reviewed: I have personally reviewed following labs and imaging studies  CBC: Recent Labs  Lab 10/01/20 0539 10/02/20 0539 10/03/20 0442 10/04/20 0507 10/05/20 0435  WBC 10.6* 6.2 7.5 12.6* 9.9  HGB 8.3* 8.6* 9.5* 8.5* 8.8*  HCT 25.5* 25.7* 29.0* 26.2* 27.7*  MCV 75.0* 75.8* 75.7* 77.1* 78.2*  PLT 250 245 296 305 854   Basic Metabolic Panel: Recent Labs  Lab 10/01/20 0539 10/02/20 0539 10/03/20 0442 10/04/20 0507 10/05/20 0435  NA 131* 131* 132* 134* 130*  K 3.9 3.9 3.9 4.2 4.1  CL 101 99 99 100 99  CO2 22 25 25 26 22   GLUCOSE 169* 180* 141* 120* 114*  BUN <5* <5* <5* 7* 6*  CREATININE 0.84 0.80 0.67 0.85 0.67  CALCIUM 8.1* 8.0* 8.6* 8.4* 8.3*  MG 2.0 2.1 2.0 2.0 2.0   GFR: Estimated Creatinine Clearance: 86.2 mL/min (by C-G formula based on SCr of 0.67 mg/dL). Liver Function Tests: No results for input(s): AST, ALT, ALKPHOS, BILITOT, PROT, ALBUMIN in the last 168 hours. No results for input(s): LIPASE, AMYLASE in the last 168 hours. No results for input(s): AMMONIA in the last 168 hours. Coagulation Profile: No results for input(s): INR, PROTIME in the last 168 hours. Cardiac Enzymes: No results for input(s): CKTOTAL, CKMB, CKMBINDEX, TROPONINI in the last 168 hours. BNP (last 3 results) No results for input(s): PROBNP in the last 8760 hours. HbA1C: No results for input(s): HGBA1C in the last 72 hours. CBG: Recent Labs  Lab 09/30/20 2032  GLUCAP 138*   Lipid  Profile: No results for input(s): CHOL, HDL, LDLCALC, TRIG, CHOLHDL, LDLDIRECT in the last 72 hours. Thyroid Function Tests: No results for input(s): TSH, T4TOTAL, FREET4, T3FREE, THYROIDAB in the last 72 hours. Anemia Panel: No results for input(s): VITAMINB12, FOLATE, FERRITIN, TIBC, IRON, RETICCTPCT in the last 72 hours. Sepsis Labs: No results for input(s): PROCALCITON, LATICACIDVEN in the last 168 hours.  No results found for this or any previous visit (from the past 240 hour(s)).       Radiology Studies: No results found.      Scheduled Meds:  acetaminophen  1,000 mg Oral Q6H   Chlorhexidine Gluconate Cloth  6 each Topical Daily   enoxaparin (LOVENOX) injection  40 mg Subcutaneous Q24H   feeding supplement  1 Container Oral TID BM   pantoprazole  40 mg Oral BID   vitamin B-12  500 mcg Oral Daily   Continuous Infusions:  dextrose 5 % and 0.45 % NaCl with KCl 20 mEq/L 75 mL/hr at 10/05/20 1115   methocarbamol (ROBAXIN) IV 500 mg (10/05/20 0549)          Aline August, MD [facility] 10/05/2020, 12:46 PM

## 2020-10-05 NOTE — Progress Notes (Addendum)
5 Days Post-Op  Subjective: No nausea or vomiting. No G tube output charted, minimal fluid in cannister this morning. Ambulated yesterday, worked with PT/OT.   Objective: Vital signs in last 24 hours: Temp:  [98.7 F (37.1 C)-99.1 F (37.3 C)] 98.7 F (37.1 C) (09/21 0450) Pulse Rate:  [73-77] 73 (09/21 0450) Resp:  [20] 20 (09/21 0450) BP: (134-151)/(63-65) 134/65 (09/21 0450) SpO2:  [95 %-97 %] 96 % (09/21 0450) Last BM Date: 09/28/20  Intake/Output from previous day: 09/20 0701 - 09/21 0700 In: 0  Out: 640 [Urine:600; Drains:40] Intake/Output this shift: Total I/O In: -  Out: 150 [Urine:150]  PE: Gen: NAD Heart: regular Lungs: normal work of breathing Abd: soft, appropriately tender, mildly distended, G tube to gravity with minimal bilious fluid.  JP drain with serosanguinous fluid.  Midline incision with staples, honeycomb in place.  Colostomy with viable and pink stoma, bowel sweat in bag, no stool.  Lab Results:  Recent Labs    10/04/20 0507 10/05/20 0435  WBC 12.6* 9.9  HGB 8.5* 8.8*  HCT 26.2* 27.7*  PLT 305 299   BMET Recent Labs    10/04/20 0507 10/05/20 0435  NA 134* 130*  K 4.2 4.1  CL 100 99  CO2 26 22  GLUCOSE 120* 114*  BUN 7* 6*  CREATININE 0.85 0.67  CALCIUM 8.4* 8.3*   PT/INR No results for input(s): LABPROT, INR in the last 72 hours. CMP     Component Value Date/Time   NA 130 (L) 10/05/2020 0435   K 4.1 10/05/2020 0435   CL 99 10/05/2020 0435   CO2 22 10/05/2020 0435   GLUCOSE 114 (H) 10/05/2020 0435   BUN 6 (L) 10/05/2020 0435   CREATININE 0.67 10/05/2020 0435   CREATININE 1.01 09/15/2020 1415   CALCIUM 8.3 (L) 10/05/2020 0435   PROT 6.3 (L) 09/25/2020 1304   ALBUMIN 3.1 (L) 09/25/2020 1304   AST 20 09/25/2020 1304   AST 13 (L) 09/15/2020 1415   ALT 26 09/25/2020 1304   ALT 11 09/15/2020 1415   ALKPHOS 67 09/25/2020 1304   BILITOT 0.9 09/25/2020 1304   BILITOT 0.7 09/15/2020 1415   GFRNONAA >60 10/05/2020 0435    GFRNONAA >60 09/15/2020 1415   GFRAA >60 09/17/2018 0607   Lipase     Component Value Date/Time   LIPASE 36 09/14/2018 1838       Studies/Results: No results found.  Anti-infectives: Anti-infectives (From admission, onward)    Start     Dose/Rate Route Frequency Ordered Stop   09/30/20 0900  cefoTEtan (CEFOTAN) 2 g in sodium chloride 0.9 % 100 mL IVPB        2 g 200 mL/hr over 30 Minutes Intravenous On call to O.R. 09/30/20 0800 09/30/20 1421        Assessment/Plan POD 5, s/p lap assisted Hartmann's with g-tube placement by Dr. 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 Arnoldo Morale   LOS: 8 days    Marcus [name] , [facility] 10/05/2020, 8:17 AM Please see Amion  for pager number during day hours 7:00am-4:30pm or 7:00am -11:30am on weekends

## 2020-10-05 NOTE — Progress Notes (Signed)
Chaplain engaged in a follow-up visit with [name].  Chaplain was able to learn from [place] about his healthcare journey.  He voiced that he has never been this sick before.  He noted the places in which he has cancer on several occasions.  He also verbalized being scared in knowing there is no cure.  [name] is receiving some hope in the possibility of going to live with his brother and sister-in-law in [place].  [name] desires to be in a place of not having to worry about finances, food or anything else.  He is tired of barely making it and taking care of himself, and would love some support.  [name] spoke highly of his brother and also his brother-in-law.    One of his main concerns at the moment is thinking about where he will go once he discharges.  He knows that going to a SNF, rehab or facility will be best for him.  He doesn't believe he can go back to where he was living and is planning on having his brother-in-law pack his stuff up and move him out.  After going to a SNF or facility and getting some things settled, he wants to get to [place] to be with his brother.  Chaplain provided education on Advanced Directives.  He wants to appoint his brother as his healthcare POA.  [name] does have children but he is not in communication with them.  [name] would like to complete the paperwork with his family present.      10/05/20 1300  Clinical Encounter Type  Visited With Patient  Visit Type Follow-up;Spiritual support

## 2020-10-05 NOTE — Consult Note (Signed)
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  He has had spinal cord decompression for spinal tumor September 2022.  He just had a colostomy for sigmoid obstructing mass.  He has an esophageal mass.  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  He has had radiation.  Radiation Oncology recommended urology consultation for antigen deprivation therapy.  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    Prostate cancer ([place]) 09/04/13   Adenocarcinoma   Past Surgical History:  Procedure Laterality Date   BIOPSY  09/26/2020   Procedure: BIOPSY;  Surgeon: Ronnette Juniper, MD;  Location: [facility];  Service: Gastroenterology;;   BIOPSY  09/28/2020   Procedure: BIOPSY;  Surgeon: Ronnette Juniper, MD;  Location: [facility];  Service: Gastroenterology;;   COLON RESECTION N/A 09/30/2020   Procedure: HAND ASSISTED LAPAROSCOPIC HARTMAN'S PROCEDURE WITH END COLOSTOMY; TAP BLOCK, PLACEMENT OF PEG TUBE;  Surgeon: Greer Pickerel, MD;  Location: [facility];  Service: General;  Laterality: N/A;   ESOPHAGOGASTRODUODENOSCOPY (EGD) WITH PROPOFOL N/A 09/26/2020   Procedure: ESOPHAGOGASTRODUODENOSCOPY (EGD) WITH PROPOFOL;  Surgeon: Ronnette Juniper, MD;  [facility];  Service: Gastroenterology;  Laterality: N/A;   FLEXIBLE SIGMOIDOSCOPY N/A 09/28/2020   Procedure: FLEXIBLE SIGMOIDOSCOPY;  Surgeon: Ronnette Juniper, MD;  [facility];  Service: Gastroenterology;  Laterality: N/A;   LAMINECTOMY N/A 09/18/2020   Procedure: THORACIC LAMINECTOMY FOR TUMOR;  Surgeon: Newman Pies, MD;  Location: [facility];  Service: Neurosurgery;   Laterality: N/A;   LYMPHADENECTOMY Bilateral 09/04/2013   Procedure: PELVIC LYMPH NODE DISSECTION;  Surgeon: Alexis Frock, MD;  Location: [facility];  Service: Urology;  Laterality: Bilateral;   ROBOT ASSISTED LAPAROSCOPIC RADICAL PROSTATECTOMY N/A 09/04/2013   Procedure: ROBOTIC ASSISTED LAPAROSCOPIC RADICAL PROSTATECTOMY, INDOCYANINE GREEN DYE;  Surgeon: Alexis Frock, MD;  [facility];  Service: Urology;  Laterality: N/A;   SUBMUCOSAL TATTOO INJECTION  09/28/2020   Procedure: SUBMUCOSAL TATTOO INJECTION;  Surgeon: Ronnette Juniper, MD;  Location: [facility];  Service: Gastroenterology;;   TOTAL HIP ARTHROPLASTY Right 05/15/2016   Procedure: RIGHT TOTAL HIP ARTHROPLASTY ANTERIOR APPROACH;  Surgeon: Paralee Cancel, MD;  [facility];  Service: Orthopedics;  Laterality: Right;   WRIST GANGLION EXCISION      Medications:  Allergies: No Known Allergies  Family History  Problem Relation Age of Onset   Seizures Mother    Cancer Father        throat cancer   Social History:  reports that he has been smoking cigarettes. He has been smoking an average of .25 packs per day. He has never used smokeless tobacco. He reports that he does not currently use alcohol. He reports that he does not use drugs.  ROS: All systems are reviewed and negative except as noted.  Physical Exam:  Vital signs in last 24 hours: Temp:  [98.1 F (36.7 C)-99.1 F (37.3 C)] 98.1 F (36.7 C) (09/21 1407) Pulse Rate:  [67-77] 67 (09/21 1407) Resp:  [20] 20 (09/21 0450) BP: (134-163)/(65-85) 163/85 (09/21 1407) SpO2:  [96 %-99 %] 99 % (09/21 1407)  Cardiovascular:  Skin warm; not flushed Respiratory: Breaths quiet; no shortness of breath Abdomen: No masses Neurological: Normal sensation to touch Musculoskeletal: Normal motor function arms and legs Lymphatics: No inguinal adenopathy Skin: No rashes Genitourinary:  Laboratory Data:  Results for orders placed or performed during the hospital encounter of 09/25/20  (from the past 72 hour(s))  Basic metabolic panel     Status: Abnormal   Collection Time: 10/03/20  4:42 AM  Result Value Ref Range   Sodium 132 (L) 135 - 145 mmol/L   Potassium 3.9 3.5 - 5.1 mmol/L   Chloride 99 98 - 111 mmol/L   CO2 25 22 - 32 mmol/L   Glucose, Bld 141 (H) 70 - 99 mg/dL    Comment: Glucose reference range applies only to samples taken after fasting for at least 8 hours.   BUN <5 (L) 8 - 23 mg/dL   Creatinine, Ser 0.67 0.61 - 1.24 mg/dL   Calcium 8.6 (L) 8.9 - 10.3 mg/dL   GFR, Estimated >60 >60 mL/min    Comment: (NOTE) Calculated using the CKD-EPI Creatinine Equation (2021)    Anion gap 8 5 - 15    Comment: Performed at [facility], [place] [address] [place], [place] [address]  CBC     Status: Abnormal   Collection Time: 10/03/20  4:42 AM  Result Value Ref Range   WBC 7.5 4.0 - 10.5 K/uL   RBC 3.83 (L) 4.22 - 5.81 MIL/uL   Hemoglobin 9.5 (L) 13.0 - 17.0 g/dL   HCT 29.0 (L) 39.0 - 52.0 %   MCV 75.7 (L) 80.0 - 100.0 fL   MCH 24.8 (L) 26.0 - 34.0 pg   MCHC 32.8 30.0 - 36.0 g/dL   RDW 18.6 (H) 11.5 - 15.5 %   Platelets 296 150 - 400 K/uL   nRBC 0.0 0.0 - 0.2 %    Comment: Performed at [facility], [place] [address] [place], [place] [address]  Magnesium     Status: None   Collection Time: 10/03/20  4:42 AM  Result Value Ref Range   Magnesium 2.0 1.7 - 2.4 mg/dL    Comment: Performed at [facility], [place] [address] [place], [place] [address]  Basic metabolic panel     Status: Abnormal   Collection Time: 10/04/20  5:07 AM  Result Value Ref Range   Sodium 134 (L) 135 - 145 mmol/L   Potassium 4.2 3.5 - 5.1 mmol/L   Chloride 100 98 - 111 mmol/L   CO2 26 22 - 32 mmol/L   Glucose, Bld 120 (H) 70 - 99 mg/dL    Comment: Glucose reference range applies only to samples taken after fasting for at least 8 hours.   BUN 7 (L) 8 - 23 mg/dL   Creatinine, Ser 0.85 0.61 - 1.24 mg/dL   Calcium 8.4 (L) 8.9 - 10.3 mg/dL    GFR, Estimated >60 >60 mL/min    Comment: (NOTE) Calculated using the CKD-EPI Creatinine Equation (2021)    Anion gap 8 5 - 15    Comment: Performed at [facility], [place] [address] [place], [place] [address]  CBC     Status: Abnormal   Collection Time: 10/04/20  5:07 AM  Result Value Ref Range   WBC 12.6 (H) 4.0 - 10.5 K/uL   RBC 3.40 (L) 4.22 - 5.81 MIL/uL   Hemoglobin 8.5 (L) 13.0 - 17.0 g/dL    Comment: Reticulocyte Hemoglobin testing may be clinically indicated,  consider ordering this additional test [id number]    HCT 26.2 (L) 39.0 - 52.0 %   MCV 77.1 (L) 80.0 - 100.0 fL   MCH 25.0 (L) 26.0 - 34.0 pg   MCHC 32.4 30.0 - 36.0 g/dL   RDW 19.2 (H) 11.5 - 15.5 %   Platelets 305 150 - 400 K/uL   nRBC 0.0 0.0 - 0.2 %    Comment: Performed at [facility], [place] [address], [place], [place] [address]  Magnesium     Status: None   Collection Time: 10/04/20  5:07 AM  Result Value Ref Range   Magnesium 2.0 1.7 - 2.4 mg/dL    Comment: Performed at [facility], [place] [address] [place], [place] [address]  Basic metabolic panel     Status: Abnormal   Collection Time: 10/05/20  4:35 AM  Result Value Ref Range   Sodium 130 (L) 135 - 145 mmol/L   Potassium 4.1 3.5 - 5.1 mmol/L   Chloride 99 98 - 111 mmol/L   CO2 22 22 - 32 mmol/L   Glucose, Bld 114 (H) 70 - 99 mg/dL    Comment: Glucose reference range applies only to samples taken after fasting for at least 8 hours.   BUN 6 (L) 8 - 23 mg/dL   Creatinine, Ser 0.67 0.61 - 1.24 mg/dL   Calcium 8.3 (L) 8.9 - 10.3 mg/dL   GFR, Estimated >60 >60 mL/min    Comment: (NOTE) Calculated using the CKD-EPI Creatinine Equation (2021)    Anion gap 9 5 - 15    Comment: Performed at [facility], [place] [address], [place], [place] [address]  CBC     Status: Abnormal   Collection Time: 10/05/20  4:35 AM  Result Value Ref Range   WBC 9.9 4.0 - 10.5 K/uL   RBC 3.54 (L) 4.22  - 5.81 MIL/uL   Hemoglobin 8.8 (L) 13.0 - 17.0 g/dL   HCT 27.7 (L) 39.0 - 52.0 %   MCV 78.2 (L) 80.0 - 100.0 fL   MCH 24.9 (L) 26.0 - 34.0 pg   MCHC 31.8 30.0 - 36.0 g/dL   RDW 19.7 (H) 11.5 - 15.5 %   Platelets 299 150 - 400 K/uL   nRBC 0.0 0.0 - 0.2 %    Comment: Performed at [facility], [place] [address] [place], [place] [address]  Magnesium     Status: None   Collection Time: 10/05/20  4:35 AM  Result Value Ref Range   Magnesium 2.0 1.7 - 2.4 mg/dL    Comment: Performed at [facility], [place] [address], [place], [place] [address]   No results found for this or any previous visit (from the past 240 hour(s)). Creatinine: Recent Labs    09/29/20 0505 09/30/20 0444 10/01/20 0539 10/02/20 0539 10/03/20 0442 10/04/20 0507 10/05/20 0435  CREATININE 0.96 0.80 0.84 0.80 0.67 0.85 0.67    Xrays: See report/chart   Impression/Assessment:    Plan:    Marcus [name] 10/05/2020, 3:14 PM

## 2020-10-06 DIAGNOSIS — C61 Malignant neoplasm of prostate: Secondary | ICD-10-CM | POA: Diagnosis not present

## 2020-10-06 DIAGNOSIS — C187 Malignant neoplasm of sigmoid colon: Secondary | ICD-10-CM | POA: Diagnosis not present

## 2020-10-06 DIAGNOSIS — K2289 Other specified disease of esophagus: Secondary | ICD-10-CM | POA: Diagnosis not present

## 2020-10-06 DIAGNOSIS — C154 Malignant neoplasm of middle third of esophagus: Secondary | ICD-10-CM | POA: Diagnosis not present

## 2020-10-06 DIAGNOSIS — E871 Hypo-osmolality and hyponatremia: Secondary | ICD-10-CM | POA: Diagnosis not present

## 2020-10-06 LAB — CBC
HCT: 26.3 % — ABNORMAL LOW (ref 39.0–52.0)
Hemoglobin: 8.5 g/dL — ABNORMAL LOW (ref 13.0–17.0)
MCH: 25.4 pg — ABNORMAL LOW (ref 26.0–34.0)
MCHC: 32.3 g/dL (ref 30.0–36.0)
MCV: 78.5 fL — ABNORMAL LOW (ref 80.0–100.0)
Platelets: 310 10*3/uL (ref 150–400)
RBC: 3.35 MIL/uL — ABNORMAL LOW (ref 4.22–5.81)
RDW: 18.8 % — ABNORMAL HIGH (ref 11.5–15.5)
WBC: 4.3 10*3/uL (ref 4.0–10.5)
nRBC: 0 % (ref 0.0–0.2)

## 2020-10-06 LAB — BASIC METABOLIC PANEL
Anion gap: 5 (ref 5–15)
BUN: 5 mg/dL — ABNORMAL LOW (ref 8–23)
CO2: 23 mmol/L (ref 22–32)
Calcium: 8.5 mg/dL — ABNORMAL LOW (ref 8.9–10.3)
Chloride: 105 mmol/L (ref 98–111)
Creatinine, Ser: 0.77 mg/dL (ref 0.61–1.24)
GFR, Estimated: >60 mL/min (ref >60)
Glucose, Bld: 118 mg/dL — ABNORMAL HIGH (ref 70–99)
Potassium: 4.3 mmol/L (ref 3.5–5.1)
Sodium: 133 mmol/L — ABNORMAL LOW (ref 135–145)

## 2020-10-06 LAB — MAGNESIUM: Magnesium: 2.2 mg/dL (ref 1.7–2.4)

## 2020-10-06 NOTE — Care Management Important Message (Signed)
Important Message  Patient Details IM Letter given to the Patient. Name: Marcus [name] MRN: [id number] Date of Birth: Dec 24, 1945   Medicare Important Message Given:  Yes     Kerin Salen 10/06/2020, 11:19 AM

## 2020-10-06 NOTE — Progress Notes (Signed)
Patient ID: Marcus [name], male   DOB: 1945/06/16, 75 y.o.   MRN: [id number]  PROGRESS NOTE    Marcus [name]  JQZ:[id number] DOB: February 03, 1945 DOA: 09/25/2020 PCP: [name], MD   Brief Narrative:  75 y.o. 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 Arnoldo Morale on 09/18/20 and anemia presented with upper back pain, decreased appetite and black stool.  EGD showed esophageal mass; biopsy consistent with poorly differentiated carcinoma.  CT of the abdomen showed possible mass; colonoscopy showed sigmoid obstructing mass.  He underwent Hartman's procedure with end colostomy on 09/30/2020.  Oncology and radiation oncology were consulted.  Assessment & Plan:   Obstructing sigmoid colon mass status post Hartman's procedure with end colostomy and G-tube placement on 09/30/2020 Postop ileus -General surgery following.  Wound care and diet as per general surgery recommendations. -Continue IV fluids  New diagnosis of poorly differentiated esophageal carcinoma -Status post EGD on 09/26/2020. -Oncology and radiation oncology following.  Outpatient follow-up with radiation oncology. -Patient will need port placement prior to discharge as per oncology.  His general surgery will contemplate placing that once patient is more stable and closer to discharge. -Palliative care had evaluated the patient during this hospitalization.  Patient remains full code at this time  Metastatic prostate cancer Postsurgical back pain -S/p radical prostatectomy and radiation in 2015 -Thoracic spine tumor with cord compression status post T5-6 laminectomy with posterior lateral arthrodesis from T4-T8 by Dr. Arnoldo Morale on 09/18/2020 -Radiation oncology is recommending urology consult for androgen deprivation therapy.  Communicated with Dr.  Macdiarmid/urology on 10/05/2020 who saw the patient on 10/05/2020 and recommended outpatient follow-up with Dr. Tresa Moore. -Outpatient follow-up with neurosurgery  Pyuria/bacteriuria -Currently being monitored off antibiotics  Leukocytosis -Resolved  Anemia of chronic disease -From cancer.  Hemoglobin stable  Hyponatremia -Mild.  Monitor  Generalized deconditioning -Will eventually need SNF placement  DVT prophylaxis: Lovenox Code Status: Full Family Communication: None at bedside Disposition Plan: Status is: Inpatient  Remains inpatient appropriate because:Inpatient level of care appropriate due to severity of illness  Dispo: The patient is from: Home              Anticipated d/c is to: SNF              Patient currently is not medically stable to d/c.   Difficult to place patient No  Consultants: GI/general surgery/oncology/radiation oncology/palliative care  Procedures: EGD/colonoscopy Hartman's procedure with end colostomy and G-tube placement on 09/30/2020  Antimicrobials:  Anti-infectives (From admission, onward)    Start     Dose/Rate Route Frequency Ordered Stop   09/30/20 0900  cefoTEtan (CEFOTAN) 2 g in sodium chloride 0.9 % 100 mL IVPB        2 g 200 mL/hr over 30 Minutes Intravenous On call to O.R. 09/30/20 0800 09/30/20 1421        Subjective: Patient seen and examined at bedside.  No worsening shortness of breath, fever, vomiting reported.  Has not tried much clear liquid diet yet. Objective: Vitals:   10/05/20 0450 10/05/20 1407 10/05/20 2000 10/06/20 0347  BP: 134/65 (!) 163/85 (!) 154/64 (!) 169/83  Pulse: 73 67 71 78  Resp: 20  20 18   Temp: 98.7 F (37.1 C) 98.1 F (36.7 C) 97.7 F (36.5 C) 98 F (36.7 C)  TempSrc: Oral Oral Oral Oral  SpO2:  96% 99% 97% 97%  Weight:      Height:        Intake/Output Summary (Last 24 hours) at 10/06/2020 0813 Last data filed at 10/06/2020 0630 Gross per 24 hour  Intake 6341.76 ml  Output 1975 ml  Net  4366.76 ml    Filed Weights   09/25/20 0617 09/26/20 1245 09/28/20 0800  Weight: 70.7 kg 74.8 kg 74.8 kg    Examination:  General exam: On room air currently.  No distress.  Looks chronically ill and deconditioned.   Respiratory system: Decreased breath sounds at bases bilaterally Cardiovascular system: Rate controlled, S1-S2 heard gastrointestinal system: Abdomen is mildly distended, soft and mildly tender.  G-tube and colostomy bag present.  Bowel sounds are heard extremities: No edema or clubbing Central nervous system: Still slow to respond, alert.  Poor historian.  No focal neurological deficits.  Moves extremities  skin: No obvious ecchymosis/lesions  psychiatry: Flat affect mostly  Data Reviewed: I have personally reviewed following labs and imaging studies  CBC: Recent Labs  Lab 10/02/20 0539 10/03/20 0442 10/04/20 0507 10/05/20 0435 10/06/20 0458  WBC 6.2 7.5 12.6* 9.9 4.3  HGB 8.6* 9.5* 8.5* 8.8* 8.5*  HCT 25.7* 29.0* 26.2* 27.7* 26.3*  MCV 75.8* 75.7* 77.1* 78.2* 78.5*  PLT 245 296 305 299 829    Basic Metabolic Panel: Recent Labs  Lab 10/02/20 0539 10/03/20 0442 10/04/20 0507 10/05/20 0435 10/06/20 0458  NA 131* 132* 134* 130* 133*  K 3.9 3.9 4.2 4.1 4.3  CL 99 99 100 99 105  CO2 25 25 26 22 23   GLUCOSE 180* 141* 120* 114* 118*  BUN <5* <5* 7* 6* 5*  CREATININE 0.80 0.67 0.85 0.67 0.77  CALCIUM 8.0* 8.6* 8.4* 8.3* 8.5*  MG 2.1 2.0 2.0 2.0 2.2    GFR: Estimated Creatinine Clearance: 86.2 mL/min (by C-G formula based on SCr of 0.77 mg/dL). Liver Function Tests: No results for input(s): AST, ALT, ALKPHOS, BILITOT, PROT, ALBUMIN in the last 168 hours. No results for input(s): LIPASE, AMYLASE in the last 168 hours. No results for input(s): AMMONIA in the last 168 hours. Coagulation Profile: No results for input(s): INR, PROTIME in the last 168 hours. Cardiac Enzymes: No results for input(s): CKTOTAL, CKMB, CKMBINDEX, TROPONINI in the last 168  hours. BNP (last 3 results) No results for input(s): PROBNP in the last 8760 hours. HbA1C: No results for input(s): HGBA1C in the last 72 hours. CBG: Recent Labs  Lab 09/30/20 2032  GLUCAP 138*    Lipid Profile: No results for input(s): CHOL, HDL, LDLCALC, TRIG, CHOLHDL, LDLDIRECT in the last 72 hours. Thyroid Function Tests: No results for input(s): TSH, T4TOTAL, FREET4, T3FREE, THYROIDAB in the last 72 hours. Anemia Panel: No results for input(s): VITAMINB12, FOLATE, FERRITIN, TIBC, IRON, RETICCTPCT in the last 72 hours. Sepsis Labs: No results for input(s): PROCALCITON, LATICACIDVEN in the last 168 hours.  No results found for this or any previous visit (from the past 240 hour(s)).       Radiology Studies: No results found.      Scheduled Meds:  acetaminophen  1,000 mg Oral Q6H   Chlorhexidine Gluconate Cloth  6 each Topical Daily   enoxaparin (LOVENOX) injection  40 mg Subcutaneous Q24H   feeding supplement  1 Container Oral TID BM   pantoprazole  40 mg Oral BID   vitamin B-12  500 mcg Oral Daily   Continuous Infusions:  dextrose 5 % and 0.45 % NaCl with KCl 20 mEq/L 75  mL/hr at 10/06/20 0630   methocarbamol (ROBAXIN) IV 500 mg (10/06/20 0552)          Aline August, MD [facility] 10/06/2020, 8:13 AM

## 2020-10-06 NOTE — Plan of Care (Signed)
  Problem: Clinical Measurements: Goal: Will remain free from infection 10/06/2020 2321 by Deveron Furlong, RN Outcome: Progressing 10/06/2020 2319 by Deveron Furlong, RN Outcome: Progressing Goal: Cardiovascular complication will be avoided Outcome: Progressing   Problem: Activity: Goal: Risk for activity intolerance will decrease Outcome: Progressing   Problem: Nutrition: Goal: Adequate nutrition will be maintained 10/06/2020 2321 by Deveron Furlong, RN Outcome: Progressing 10/06/2020 2319 by Deveron Furlong, RN Outcome: Progressing   Problem: Elimination: Goal: Will not experience complications related to bowel motility 10/06/2020 2321 by Deveron Furlong, RN Outcome: Progressing 10/06/2020 2319 by Deveron Furlong, RN Outcome: Progressing   Problem: Pain Managment: Goal: General experience of comfort will improve 10/06/2020 2321 by Deveron Furlong, RN Outcome: Progressing 10/06/2020 2319 by Deveron Furlong, RN Outcome: Progressing   Problem: Safety: Goal: Ability to remain free from injury will improve 10/06/2020 2321 by Deveron Furlong, RN Outcome: Progressing 10/06/2020 2319 by Deveron Furlong, RN Outcome: Progressing   Problem: Skin Integrity: Goal: Risk for impaired skin integrity will decrease 10/06/2020 2321 by Deveron Furlong, RN Outcome: Progressing 10/06/2020 2319 by Deveron Furlong, RN Outcome: Progressing

## 2020-10-06 NOTE — Consult Note (Addendum)
[facility] Nurse ostomy consult note Pt had colostomy surgery performed 9/16.  He denies any family who will be helping after discharge and plans to go to a SNF where he will have total assistance with pouching activities.  Stoma type/location: Stoma is red and viable, above skin level, 1 1/2 inches Peristomal assessment: intact skin surrounding Output: small amt liquid, no stool or flatus in the pouch since surgery. Ostomy pouching: 1 pc.  Education provided:  Demonstrated pouch change using 1 piece pouch.  Pt watched the process but did not ask questions or participate. Discussed pouch change routines and demonstrated emptying. 4 extra one piece pouches left in the room for staff nurse use. Enrolled patient in [facility] program: Yes Thank-you,  Julien Girt MSN, RN

## 2020-10-06 NOTE — Progress Notes (Addendum)
HEMATOLOGY-ONCOLOGY PROGRESS NOTE  SUBJECTIVE: Mr. Szabo continues to slowly recover from surgery.  He denies any stool output through his ostomy.  He reports flatus.  He is to have pain with movement.  Denies vomiting.  He has no other complaints today.  Oncology History Overview Note  Called the available numbers this morning, his brother in law Mr Macky Lower answered.  Advised him to please talk to the patient ASAP because we are unable to get hold of him. I mentioned this is regarding the abnormal MRI and he needs to come to the ER for further treatment. Mr Henrene Pastor expressed understanding and he will try to get hold of Mr Schetter ASAP. The urgency of the matter was clearly conveyed. Mr Henrene Pastor apparently talked to his room mate and conveyed the message to his room mate that Mr Hearns need to get in touch back with him urgently and he reassured me he will relay the message to Mr Lodge ASAP      REVIEW OF SYSTEMS:   A 10 point review of systems is negative except as noted in the HPI.  I have reviewed the past medical history, past surgical history, social history and family history with the patient and they are unchanged from previous note.   PHYSICAL EXAMINATION: ECOG PERFORMANCE STATUS: 1 - Symptomatic but completely ambulatory  Vitals:   10/06/20 0347 10/06/20 1323  BP: (!) 169/83 (!) 160/79  Pulse: 78 73  Resp: 18   Temp: 98 F (36.7 C) 98.2 F (36.8 C)  SpO2: 97% 99%   Filed Weights   09/25/20 0617 09/26/20 1245 09/28/20 0800  Weight: 70.7 kg 74.8 kg 74.8 kg    Intake/Output from previous day: 09/21 0701 - 09/22 0700 In: 6341.8 [P.O.:180; I.V.:5911.8; IV Piggyback:250] Out: 2125 [Urine:1875; Drains:250]  GENERAL: Awake and alert, no distress LUNGS: clear to auscultation and percussion with normal breathing effort HEART: regular rate & rhythm and no murmurs and no lower extremity edema ABDOMEN: Soft, tender to light palpation, G-tube in place, colostomy with pink  stoma, dressing clean dry intact. NEURO: alert & oriented x 3 with fluent speech, no focal motor/sensory deficits  LABORATORY DATA:  I have reviewed the data as listed CMP Latest Ref Rng & Units 10/06/2020 10/05/2020 10/04/2020  Glucose 70 - 99 mg/dL 118(H) 114(H) 120(H)  BUN 8 - 23 mg/dL 5(L) 6(L) 7(L)  Creatinine 0.61 - 1.24 mg/dL 0.77 0.67 0.85  Sodium 135 - 145 mmol/L 133(L) 130(L) 134(L)  Potassium 3.5 - 5.1 mmol/L 4.3 4.1 4.2  Chloride 98 - 111 mmol/L 105 99 100  CO2 22 - 32 mmol/L 23 22 26   Calcium 8.9 - 10.3 mg/dL 8.5(L) 8.3(L) 8.4(L)  Total Protein 6.5 - 8.1 g/dL - - -  Total Bilirubin 0.3 - 1.2 mg/dL - - -  Alkaline Phos 38 - 126 U/L - - -  AST 15 - 41 U/L - - -  ALT 0 - 44 U/L - - -    Lab Results  Component Value Date   WBC 4.3 10/06/2020   HGB 8.5 (L) 10/06/2020   HCT 26.3 (L) 10/06/2020   MCV 78.5 (L) 10/06/2020   PLT 310 10/06/2020   NEUTROABS 6.4 09/25/2020    DG Lumbar Spine 2-3 Views  Result Date: 09/18/2020 CLINICAL DATA:  Elective surgery. EXAM: LUMBAR SPINE - 2-3 VIEW COMPARISON:  Prior thoracic spine imaging. FINDINGS: Two lateral coned spot views of the spine obtained in the operating room. Pedicle screws are seen at 2 contiguous  levels as well as the more superior level. Levels are not well delineated on these coned operative views. Fluoroscopy time 1 minute 6 seconds. IMPRESSION: Procedural fluoroscopy for spine surgery, recommend correlation with operative report for details. Electronically Signed   By: Keith Rake M.D.   On: 09/18/2020 18:36   DG Abd 1 View  Result Date: 10/02/2020 CLINICAL DATA:  Postoperative Hartmann's and PEG placement. Generalized abdominal pain. EXAM: ABDOMEN - 1 VIEW COMPARISON:  CT chest abdomen and pelvis 09/26/2020. FINDINGS: There is skin staples and surgical drain in the pelvis, new from prior. There is diffuse gaseous distension of large and small bowel to the level of the rectum. There is some mildly dilated air-filled  small bowel loops. There is mild elevation of the right hemidiaphragm. Free air cannot be excluded on a supine view. Peg tube overlies the upper abdomen. 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 IMPRESSION: 1. There is diffuse gaseous distension of large and small bowel suspicious for ileus. Some small bowel loops are mildly dilated and a small bowel obstruction cannot be excluded. 2. New postsurgical changes in the pelvis. Electronically Signed   By: Ronney Asters M.D.   On: 10/02/2020 15:51   MR THORACIC SPINE W WO CONTRAST  Result Date: 09/16/2020 CLINICAL DATA:  Cord compression. 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 Evaluate for bone metastases. EXAM: MRI THORACIC WITHOUT AND WITH CONTRAST TECHNIQUE: Multiplanar and multiecho pulse sequences of the thoracic spine were obtained without and with intravenous contrast. CONTRAST:  7.31mL GADAVIST GADOBUTROL 1 MMOL/ML IV SOLN COMPARISON:  CT of the thoracic spine 08/31/2020. FINDINGS: Alignment:  No significant spondylolisthesis. Vertebrae: Osseous lesions within the posterior T5 vertebral body and T5 posterior elements, as well as T6 vertebral body and posterior elements. Involvement of the T6 vertebral body and posterior elements is extensive. In addition to involvement of the right T6 transverse process, there is also involvement of the posterior right sixth rib. There is also involvement of the bilateral T7 pedicles/articular pillars, T7 spinous process, right T7 transverse process, and subtle involvement of the posterior T7 vertebral body. There is subtle heterogeneous enhancement of the T8 spinous process, which could reflect an additional lesion (series 23, image 8). Aditionally, there is partially ossified enhancing epidural tumor within the right posterolateral aspect of the spinal canal at the T5-T6 level. Resultant severe spinal canal stenosis with spinal cord compression. There may be subtle T2  hyperintense signal abnormality within the spinal cord at this level compatible with myelomalacia and/or focal edema (series 17, image 10). Additionally, tumor encroaches upon the right T5-T6 and T6-T7 neural foramina. Cord: Significant spinal cord compression at T5-T6 by tumor, as described above. Possible subtle T2/STIR hyperintense signal abnormality within the spinal cord at this level, which may reflect focal edema and/or myelomalacia (series 17, image 10). Paraspinal and other soft tissues: No abnormality identified within included portions of the thorax or upper abdomen/retroperitoneum. Disc levels: No more than mild disc degeneration within the thoracic spine. No significant focal disc herniation or degenerative spinal canal stenosis. Mild facet arthrosis within the lower thoracic spine. No degenerative compressive foraminal stenosis. Impressions 1 and 2 will be called to the ordering clinician or representative by the Radiologist Assistant, and communication documented in the PACS or [employer]. IMPRESSION: Osseous lesions within the T5, T6, T7 and possibly T8 vertebra, as well as right 6th rib as detailed. These findings are highly suspicious for osseous metastatic disease given the patient's history of prostate cancer. Additionally,  there is partially ossified epidural tumor within the right dorsal aspect of the spinal canal at T5-T6, with resultant severe spinal canal stenosis and significant spinal cord compression. Subtle signal abnormality is questioned within the spinal cord at this level, and this may reflect focal edema and/or myelomalacia. Tumor also encroaches upon the right T5-T6 and T6-T7 neural foramina. Electronically Signed   By: Kellie Simmering D.O.   On: 09/16/2020 20:04   CT CHEST ABDOMEN PELVIS W CONTRAST  Result Date: 09/26/2020 CLINICAL DATA:  Esophageal mass, history of prostate cancer, thoracic bony metastases, epidural thoracic mass on previous MRI EXAM: CT CHEST, ABDOMEN,  AND PELVIS WITH CONTRAST TECHNIQUE: Multidetector CT imaging of the chest, abdomen and pelvis was performed following the standard protocol during bolus administration of intravenous contrast. CONTRAST:  42mL OMNIPAQUE IOHEXOL 350 MG/ML SOLN COMPARISON:  09/16/2020, 08/31/2020 FINDINGS: CT CHEST FINDINGS Cardiovascular: Trace pericardial effusion. The heart is not enlarged. No evidence of thoracic aortic aneurysm or dissection. Stable atherosclerosis. Mediastinum/Nodes: Irregular mural thickening within the midthoracic esophagus at the level of the carina likely corresponds to the reported history of esophageal mass, and is suspicious for neoplasm. No pathologic adenopathy within the mediastinum, hila, or axilla. Thyroid and trachea are unremarkable. Lungs/Pleura: Hypoventilatory changes are seen at the lung bases, with trace bilateral pleural effusions. No acute airspace disease or pneumothorax. No pulmonary nodules. Musculoskeletal: Postsurgical changes are seen from interval laminectomy and posterior fusion spanning T4 through T8. Sclerotic lesions within the T5 and T6 vertebral bodies as well as the right posterior sixth rib are unchanged. There are no acute displaced fractures. Reconstructed images demonstrate no additional findings. CT ABDOMEN PELVIS FINDINGS Hepatobiliary: Within the right lobe liver image 54/2 there is a 2.9 x 2.0 cm hypodensity which has developed since prior study, highly concerning for metastatic disease. Numerous other punctate less than 3 mm hypodensities within the remainder of the liver are indeterminate. No intrahepatic duct dilation. Small calcified gallstone without evidence of acute cholecystitis. Pancreas: Unremarkable. No pancreatic ductal dilatation or surrounding inflammatory changes. Spleen: Normal in size without focal abnormality. Adrenals/Urinary Tract: Chronic thickening of the lateral limb of the left adrenal gland measuring up to 11 mm, unchanged since 2015 and  consistent with adenoma or hyperplasia. The right adrenals unremarkable. Stable small left renal cyst. No urinary tract calculi or obstructive uropathy. The bladder is unremarkable. Stomach/Bowel: There is an apple-core type lesion involving the distal sigmoid colon, with irregular circumferential wall thickening. This mass extends approximately 4.3 cm in length, and measures approximately 5.0 x 4.9 cm in cross-sectional diameter. This is highly concerning for colonic neoplasm. There is moderate retained stool within the more proximal colon, likely due to partial colonic obstruction. No evidence of small-bowel obstruction. Vascular/Lymphatic: Epiphrenic lymphadenopathy is identified, with lymph node mass anterior to the right crus of the diaphragm measuring up to 3.7 x 2.3 cm reference image 56/2. Lymphadenopathy within the gastrohepatic ligament measures up to 1.3 cm in short axis reference image 55/2. Retroperitoneal lymphadenopathy in the left para-aortic region measures up to 1.2 cm in short axis reference image 67/2. Numerous other enlarged retroperitoneal lymph nodes are seen within the aortocaval region as well. Findings are consistent with nodal metastases. There is diffuse atherosclerosis of the aorta. There is high-grade greater than 90% stenosis within the left common iliac artery. Moderate stenosis is seen within the right common iliac artery estimated 50%. Reproductive: Prostate is surgically absent. Other: Trace free fluid within the pelvis. No free intraperitoneal gas. No abdominal wall hernia.  Musculoskeletal: Unremarkable right hip arthroplasty. 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 No acute or destructive bony lesions. Reconstructed images demonstrate no additional findings. IMPRESSION: 1. Irregular circumferential wall thickening within the midthoracic esophagus consistent with the esophageal mass described in history. Findings are highly suspicious for neoplasm. 2.  Circumferential apple-core type mass involving the distal sigmoid colon compatible with colonic malignancy. Colonoscopy recommended if not previously performed. There is an element of partial colonic obstruction with significant proximal colonic dilation and fecal retention. 3. Extensive lymphadenopathy within the abdomen and pelvis as above, largest lymph node mass in the upper phrenic region, consistent with nodal metastases. 4. Hypodensities within the liver, largest in the right lobe suspicious for metastatic disease. 5. Trace bilateral pleural effusions. 6. Postsurgical changes from recent thoracic fusion and laminectomy. 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 Please refer to previous MRI discussion. 7. 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 8. 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 Electronically Signed   By: Randa Ngo M.D.   On: 09/26/2020 17:56   DG C-Arm 1-60 Min-No Report  Result Date: 09/18/2020 Fluoroscopy was utilized by the requesting physician.  No radiographic interpretation.    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 Tresa Moore.  Had extracapsular extension and positive resection margins. -Status post postoperative radiation therapy by Dr. Tammi Klippel. -Lost to follow-up after treatment.   2) T5-T7 sclerotic bone lesion involving posterior elements with right-sided epidural tumor and T5-6 T6-7 foraminal infiltration. 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  The tumor is negative with CDX2, cytokeratin 7, cytokeratin 20, MOC31, prostein and prostate-specific antigen.  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 -Biopsy from 09/28/2020 showed a high-grade dysplasia arising in a tubular adenoma-biopsy may not be representative of the mass seen endoscopically -Status post laparoscopic-assisted Hartman's with G-tube placement on 09/30/2020  5) extensive lymphadenopathy in the abdomen and hypodensities in the liver concerning for metastatic disease  6) microcytic anemia secondary to GI bleed, B12 deficiency -Labs from 09/25/2020-ferritin 374, iron 14, TIBC 235, percent saturation 6%, folate 7.5, vitamin B12 140 -Iron sucrose 200 mg IV x3 doses started on 09/29/2020   7) active smoker half a pack per day 8) poor medical follow-up 9) family history of GI malignancy -Reported sister with colon malignancy  PLAN: -Discussed biopsy from laminectomy with the patient.  Biopsy was consistent with current, metastatic prostate cancer.  Will need follow-up with radiation oncology.  We will also consider him for Lupron therapy as an outpatient. -He has a new diagnosis  of esophageal cancer.  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  He is not a candidate for esophagectomy or resection of the esophageal mass.  We will consider a course of radiation once he recovers  from surgery. -Surgical path from colon mass consistent with adenocarcinoma. CEA normal.  Status post resection 9/16.  Pathology as noted above. -May consider biopsy of one of liver lesion at some point in the future.  However, given staging of colon cancer and location with lack of locally involved esophageal lymph nodes,?  If liver lesions are due to prostate cancer as opposed to colon or esophageal cancer.  Should respond to Lupron if that is the case. -Monitor hemoglobin closely.  Transfuse for hemoglobin less than 7.5 -Vitamin B12 is being replaced.   LOS: 9 days   Mikey Bussing, DNP, AGPCNP-BC, AOCNP 10/06/20   ADDENDUM  .Patient was Personally and independently interviewed, examined and relevant elements of the history of present illness were reviewed in details and an assessment and plan was created. All elements of the patient's history of present illness , assessment and plan were discussed in details with Mikey Bussing, DNP, AGPCNP-BC, AOCNP. The above documentation reflects our combined findings assessment and plan.   I had a long discussion with the patient regarding his goals of care.  We discussed that he likely has metastatic esophageal cancer which is poorly differentiated and likely squamous cell in origin.  His liver metastases and upper abdominal lymphadenopathy appears to likely be related to metastatic esophageal cancer. He also has metastatic prostate cancer with impending cord compression which has been addressed. Colon cancer which was nearly obstructing lesion.  He is status post surgery from this and is healing.  Ostomy in situ. Plan -Postsurgical cares and optimization of bowel function. -Nutritionist evaluation to try to optimize his p.o. and tube feeding. -Physical therapy evaluation to help with improved functional status. -Outpatient radiation oncology to consider palliative radiation to follow his esophageal cancer.  We shall determine possibility of  concurrent chemotherapy based on his functional and nutritional status as outpatient. Other systemic palliative treatment options based on functional status upon discharge. -We shall start him on Lupron for his metastatic prostate cancer as outpatient. -Patient notes he wants to continue reasonable treatments and is not inclined to pursue best supportive cares alone through hospice at this time. -He notes that his brother for myeloma would likely come to [facility] to help him out.  Sullivan Lone MD MS

## 2020-10-06 NOTE — Progress Notes (Signed)
Physical Therapy Treatment Patient Details Name: Marcus [name] MRN: [id number] DOB: Jan 07, 1946 Today's Date: 10/06/2020   History of Present Illness 75 yo male admitted with GI bleed, anemia. 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 Arnoldo Morale on 9/4,  R THA, S/P colostomy 09/30/20    PT Comments    Pt tolerated increased ambulation distance of 13' with RW today, distance limited by 8-9/10 ostomy site pain and fatigue. Pt puts forth good effort.    Recommendations for follow up therapy are one component of a multi-disciplinary discharge planning process, led by the attending physician.  Recommendations may be updated based on patient status, additional functional criteria and insurance authorization.  Follow Up Recommendations  SNF     Equipment Recommendations  None recommended by PT    Recommendations for Other Services OT consult     Precautions / Restrictions Precautions Precautions: Fall;Back;Other (comment) Precaution Booklet Issued: Yes (comment) Precaution Comments: now has colostomy, cannot wear brace, R JP; reviewed log roll technique Restrictions Weight Bearing Restrictions: No     Mobility  Bed Mobility Overal bed mobility: Needs Assistance Bed Mobility: Rolling Rolling: Min assist Sidelying to sit: Min assist;HOB elevated       General bed mobility comments: verbal and manual cues for log roll technique, then min A to raise trunk; increased time 2*  pain at ostomy site    Transfers Overall transfer level: Needs assistance Equipment used: Rolling walker (2 wheeled) Transfers: Sit to/from Stand Sit to Stand: Min guard;From elevated surface         General transfer comment: with increased time and education on proper hand palcement  Ambulation/Gait Ambulation/Gait assistance: Supervision;Min guard Gait Distance (Feet): 90 Feet Assistive  device: Rolling walker (2 wheeled) Gait Pattern/deviations: Step-through pattern;Trunk flexed Gait velocity: Decreased   General Gait Details: initially more forward flexed  upper body, improved with distance, distance limited by pain, VCs posture   Stairs             Wheelchair Mobility    Modified Rankin (Stroke Patients Only)       Balance Overall balance assessment: Needs assistance Sitting-balance support: Feet supported;Bilateral upper extremity supported Sitting balance-Leahy Scale: Fair Sitting balance - Comments: Able to maintain sitting balance with support, guarding   Standing balance support: Bilateral upper extremity supported;During functional activity Standing balance-Leahy Scale: Fair Standing balance comment: use of RW for support                            Cognition Arousal/Alertness: Awake/alert Behavior During Therapy: WFL for tasks assessed/performed Overall Cognitive Status: Within Functional Limits for tasks assessed                                        Exercises      General Comments        Pertinent Vitals/Pain Pain Score: 9  Pain Location: colostomy site with activity Pain Descriptors / Indicators: Discomfort;Grimacing;Sore Pain Intervention(s): Limited activity within patient's tolerance;Monitored during session;Premedicated before session;Repositioned    Home Living                      Prior Function            PT Goals (current goals can now  be found in the care plan section) Acute Rehab PT Goals Patient Stated Goal: less pain, regain IND PT Goal Formulation: With patient Time For Goal Achievement: 10/10/20 Potential to Achieve Goals: Good Progress towards PT goals: Progressing toward goals    Frequency    Min 2X/week      PT Plan Current plan remains appropriate    Co-evaluation              AM-PAC PT "6 Clicks" Mobility   Outcome Measure  Help needed turning  from your back to your side while in a flat bed without using bedrails?: A Little Help needed moving from lying on your back to sitting on the side of a flat bed without using bedrails?: A Little Help needed moving to and from a bed to a chair (including a wheelchair)?: A Little Help needed standing up from a chair using your arms (e.g., wheelchair or bedside chair)?: A Little Help needed to walk in hospital room?: A Little Help needed climbing 3-5 steps with a railing? : A Lot 6 Click Score: 17    End of Session Equipment Utilized During Treatment: Gait belt Activity Tolerance: Patient tolerated treatment well;Patient limited by pain Patient left: with call bell/phone within reach;in bed;with bed alarm set Nurse Communication: Mobility status PT Visit Diagnosis: Unsteadiness on feet (R26.81);Muscle weakness (generalized) (M62.81);Difficulty in walking, not elsewhere classified (R26.2);Pain     Time: 1202-1224 PT Time Calculation (min) (ACUTE ONLY): 22 min  Charges:  $Gait Training: 8-22 mins                    Blondell Reveal Kistler PT 10/06/2020  Acute Rehabilitation Services Pager [phone] Office [phone]

## 2020-10-06 NOTE — Progress Notes (Signed)
6 Days Post-Op  Subjective: No nausea with g-tube clamped yesterday.  Hasn't been wanting to drink to avoid voiding so he doesn't have to mobilize.  Again I encouraged him that he is going to have to do this in order to be able to advance his diet.  ROS: See above, otherwise other systems negative  Objective: Vital signs in last 24 hours: Temp:  [97.7 F (36.5 C)-98.1 F (36.7 C)] 98 F (36.7 C) (09/22 0347) Pulse Rate:  [67-78] 78 (09/22 0347) Resp:  [18-20] 18 (09/22 0347) BP: (154-169)/(64-85) 169/83 (09/22 0347) SpO2:  [97 %-99 %] 97 % (09/22 0347) Last BM Date: 09/28/20  Intake/Output from previous day: 09/21 0701 - 09/22 0700 In: 6341.8 [P.O.:180; I.V.:5911.8; IV Piggyback:250] Out: 2125 [Urine:1875; Drains:250] Intake/Output this shift: No intake/output data recorded.  PE:  Abd: soft, appropriately tender, not really distended, g-tube clamped..  JP drain full of serous fluid.  Midline incision with staples, honeycomb in place.  Colostomy with viable and pink stoma, but no output as this was just changed, but he said there was nothing in it prior to being changed.  Lab Results:  Recent Labs    10/05/20 0435 10/06/20 0458  WBC 9.9 4.3  HGB 8.8* 8.5*  HCT 27.7* 26.3*  PLT 299 310   BMET Recent Labs    10/05/20 0435 10/06/20 0458  NA 130* 133*  K 4.1 4.3  CL 99 105  CO2 22 23  GLUCOSE 114* 118*  BUN 6* 5*  CREATININE 0.67 0.77  CALCIUM 8.3* 8.5*   PT/INR No results for input(s): LABPROT, INR in the last 72 hours. CMP     Component Value Date/Time   NA 133 (L) 10/06/2020 0458   K 4.3 10/06/2020 0458   CL 105 10/06/2020 0458   CO2 23 10/06/2020 0458   GLUCOSE 118 (H) 10/06/2020 0458   BUN 5 (L) 10/06/2020 0458   CREATININE 0.77 10/06/2020 0458   CREATININE 1.01 09/15/2020 1415   CALCIUM 8.5 (L) 10/06/2020 0458   PROT 6.3 (L) 09/25/2020 1304   ALBUMIN 3.1 (L) 09/25/2020 1304   AST 20 09/25/2020 1304   AST 13 (L) 09/15/2020 1415   ALT 26  09/25/2020 1304   ALT 11 09/15/2020 1415   ALKPHOS 67 09/25/2020 1304   BILITOT 0.9 09/25/2020 1304   BILITOT 0.7 09/15/2020 1415   GFRNONAA >60 10/06/2020 0458   GFRNONAA >60 09/15/2020 1415   GFRAA >60 09/17/2018 0607   Lipase     Component Value Date/Time   LIPASE 36 09/14/2018 1838       Studies/Results: No results found.  Anti-infectives: Anti-infectives (From admission, onward)    Start     Dose/Rate Route Frequency Ordered Stop   09/30/20 0900  cefoTEtan (CEFOTAN) 2 g in sodium chloride 0.9 % 100 mL IVPB        2 g 200 mL/hr over 30 Minutes Intravenous On call to O.R. 09/30/20 0800 09/30/20 1421        Assessment/Plan POD 6, s/p lap assisted Hartmann's with g-tube placement by Dr. Redmond Pulling 09/30/20 for Obstructing sigmoid colon mass - g-tube clamped.  Really didn't eat CLD yesterday and still with no output in ostomy.  I have encouraged him to drink today despite not wanting to mobilize as well need to know if he can tolerate this or not.  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 Arnoldo Morale   LOS: 9 days    Henreitta Cea , [facility] 10/06/2020, 9:28 AM Please see Amion for pager number during day hours 7:00am-4:30pm or 7:00am -11:30am on weekends

## 2020-10-06 NOTE — Progress Notes (Signed)
Occupational Therapy Treatment Patient Details Name: Marcus [name] MRN: [id number] DOB: 1945-12-29 Today's Date: 10/06/2020   History of present illness 75 yo male admitted with GI bleed, anemia. 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 Arnoldo Morale on 9/4,  R THA, S/P colostomy 09/30/20   OT comments  Despite pain with mobility patient agreeable and participatory in therapy. Overall supervision level to stand at toilet with rolling walker for external support as well as at sink to perform upper body bathing and to wash perianal area. Patient does need verbal cues to properly carry out log roll technique for in/out of bed and with limited activity tolerance 2* pain. Patient wanting to go to rehab to maximize endurance necessary for daily routine and would benefit from further education on maintaining back precautions.    Recommendations for follow up therapy are one component of a multi-disciplinary discharge planning process, led by the attending physician.  Recommendations may be updated based on patient status, additional functional criteria and insurance authorization.    Follow Up Recommendations  SNF    Equipment Recommendations  None recommended by OT       Precautions / Restrictions Precautions Precautions: Fall;Back;Other (comment) Precaution Booklet Issued: Yes (comment) Precaution Comments: now has colostomy, cannot wear brace, R JP; reviewed log roll technique Restrictions Weight Bearing Restrictions: No       Mobility Bed Mobility Overal bed mobility: Needs Assistance Bed Mobility: Rolling;Sidelying to Sit;Sit to Sidelying Rolling: Supervision Sidelying to sit: Min guard;HOB elevated     Sit to sidelying: Min guard General bed mobility comments: needs verbal cues for log roll technique    Transfers Overall transfer level: Needs assistance Equipment used:  Rolling walker (2 wheeled) Transfers: Sit to/from Stand Sit to Stand: Supervision         General transfer comment: needed x2 attempts to power up to standing and cue to push from bed vs pulling on walker    Balance Overall balance assessment: Needs assistance Sitting-balance support: Feet supported Sitting balance-Leahy Scale: Fair Sitting balance - Comments: Able to maintain sitting balance with support, guarding   Standing balance support: No upper extremity supported Standing balance-Leahy Scale: Fair Standing balance comment: standing at sink during ADLs                           ADL either performed or assessed with clinical judgement   ADL Overall ADL's : Needs assistance/impaired     Grooming: Wash/dry face;Wash/dry hands;Supervision/safety;Standing   Upper Body Bathing: Supervision/ safety;Standing   Lower Body Bathing: Supervison/ safety;Sit to/from stand Lower Body Bathing Details (indicate cue type and reason): peri area only         Toilet Transfer: Supervision/safety;Ambulation;RW Toilet Transfer Details (indicate cue type and reason): instruct patient to push rolling walker over toilet in order to stand and void         Functional mobility during ADLs: Supervision/safety;Rolling walker;Cueing for safety        Cognition Arousal/Alertness: Awake/alert Behavior During Therapy: [facility] for tasks assessed/performed Overall Cognitive Status: Within Functional Limits for tasks assessed  Pertinent Vitals/ Pain       Pain Assessment: 0-10 Pain Score: 9  Pain Location: colostomy site and back Pain Descriptors / Indicators: Moaning Pain Intervention(s): Patient requesting pain meds-RN notified         Frequency  Min 2X/week        Progress Toward Goals  OT Goals(current goals can now be found in the care plan section)  Progress towards OT goals: Progressing  toward goals  Acute Rehab OT Goals Patient Stated Goal: less pain, regain IND OT Goal Formulation: With patient Time For Goal Achievement: 10/10/20 Potential to Achieve Goals: Good ADL Goals Pt Will Perform Lower Body Dressing: with modified independence;with adaptive equipment;sit to/from stand Additional ADL Goal #1: patient to complete ADL routine with SUP while maintaining back precautions Additional ADL Goal #2: patient to identify 3/3 back precautions and implement them during ADL tasks.  Plan Discharge plan remains appropriate       AM-PAC OT "6 Clicks" Daily Activity     Outcome Measure   Help from another person eating meals?: None Help from another person taking care of personal grooming?: A Little Help from another person toileting, which includes using toliet, bedpan, or urinal?: A Little Help from another person bathing (including washing, rinsing, drying)?: A Little Help from another person to put on and taking off regular upper body clothing?: A Little Help from another person to put on and taking off regular lower body clothing?: A Lot 6 Click Score: 18    End of Session Equipment Utilized During Treatment: Rolling walker  OT Visit Diagnosis: Unsteadiness on feet (R26.81);Other abnormalities of gait and mobility (R26.89);Muscle weakness (generalized) (M62.81);Pain Pain - part of body:  (back, abdomen)   Activity Tolerance Patient tolerated treatment well (despite pain good participation)   Patient Left in bed;with call bell/phone within reach;with bed alarm set   Nurse Communication Patient requests pain meds        Time: 3794-3276 OT Time Calculation (min): 19 min  Charges: OT General Charges $OT Visit: 1 Visit OT Treatments $Self Care/Home Management : 8-22 mins  Marcus [name] OT OT pager: [place] 10/06/2020, 1:32 PM

## 2020-10-07 ENCOUNTER — Encounter (HOSPITAL_COMMUNITY): Payer: Self-pay | Admitting: Internal Medicine

## 2020-10-07 ENCOUNTER — Inpatient Hospital Stay (HOSPITAL_COMMUNITY): Payer: Medicare Other

## 2020-10-07 DIAGNOSIS — C154 Malignant neoplasm of middle third of esophagus: Secondary | ICD-10-CM | POA: Diagnosis not present

## 2020-10-07 DIAGNOSIS — Z515 Encounter for palliative care: Secondary | ICD-10-CM | POA: Diagnosis not present

## 2020-10-07 DIAGNOSIS — C187 Malignant neoplasm of sigmoid colon: Secondary | ICD-10-CM | POA: Diagnosis not present

## 2020-10-07 DIAGNOSIS — Z7189 Other specified counseling: Secondary | ICD-10-CM | POA: Diagnosis not present

## 2020-10-07 DIAGNOSIS — K2289 Other specified disease of esophagus: Secondary | ICD-10-CM | POA: Diagnosis not present

## 2020-10-07 LAB — CBC
HCT: 28.5 % — ABNORMAL LOW (ref 39.0–52.0)
Hemoglobin: 9.2 g/dL — ABNORMAL LOW (ref 13.0–17.0)
MCH: 25.1 pg — ABNORMAL LOW (ref 26.0–34.0)
MCHC: 32.3 g/dL (ref 30.0–36.0)
MCV: 77.7 fL — ABNORMAL LOW (ref 80.0–100.0)
Platelets: 362 10*3/uL (ref 150–400)
RBC: 3.67 MIL/uL — ABNORMAL LOW (ref 4.22–5.81)
RDW: 18.6 % — ABNORMAL HIGH (ref 11.5–15.5)
WBC: 4.1 10*3/uL (ref 4.0–10.5)
nRBC: 0 % (ref 0.0–0.2)

## 2020-10-07 LAB — BASIC METABOLIC PANEL
Anion gap: 8 (ref 5–15)
Anion gap: 8 (ref 5–15)
BUN: <5 mg/dL — ABNORMAL LOW (ref 8–23)
BUN: <5 mg/dL — ABNORMAL LOW (ref 8–23)
CO2: 24 mmol/L (ref 22–32)
CO2: 22 mmol/L (ref 22–32)
Calcium: 8.4 mg/dL — ABNORMAL LOW (ref 8.9–10.3)
Calcium: 8.7 mg/dL — ABNORMAL LOW (ref 8.9–10.3)
Chloride: 94 mmol/L — ABNORMAL LOW (ref 98–111)
Chloride: 102 mmol/L (ref 98–111)
Creatinine, Ser: 1 mg/dL (ref 0.61–1.24)
Creatinine, Ser: 0.86 mg/dL (ref 0.61–1.24)
GFR, Estimated: >60 mL/min (ref >60)
GFR, Estimated: >60 mL/min (ref >60)
Glucose, Bld: 117 mg/dL — ABNORMAL HIGH (ref 70–99)
Glucose, Bld: 103 mg/dL — ABNORMAL HIGH (ref 70–99)
Potassium: 5.8 mmol/L — ABNORMAL HIGH (ref 3.5–5.1)
Potassium: 4.5 mmol/L (ref 3.5–5.1)
Sodium: 126 mmol/L — ABNORMAL LOW (ref 135–145)
Sodium: 132 mmol/L — ABNORMAL LOW (ref 135–145)

## 2020-10-07 LAB — SURGICAL PATHOLOGY

## 2020-10-07 LAB — MAGNESIUM: Magnesium: 2 mg/dL (ref 1.7–2.4)

## 2020-10-07 MED ORDER — IOHEXOL 350 MG/ML SOLN
80.0000 mL | Freq: Once | INTRAVENOUS | Status: AC | PRN
  Administered 2020-10-07: 80 mL via INTRAVENOUS

## 2020-10-07 MED ORDER — SODIUM CHLORIDE 0.9 % IV SOLN: INTRAVENOUS | Status: DC

## 2020-10-07 MED ORDER — IOHEXOL 9 MG/ML PO SOLN
ORAL | Status: AC
  Administered 2020-10-07: 500 mL
  Filled 2020-10-07: qty 1000

## 2020-10-07 MED ORDER — IOHEXOL 9 MG/ML PO SOLN
500.0000 mL | ORAL | Status: AC
  Administered 2020-10-07: 500 mL via ORAL

## 2020-10-07 NOTE — Progress Notes (Signed)
Patient ID: Marcus [name], male   DOB: 1945/10/21, 75 y.o.   MRN: [id number]  PROGRESS NOTE    Marcus [name]  BBC:[id number] DOB: 1945-06-06 DOA: 09/25/2020 PCP: [name], MD   Brief Narrative:  75 y.o. 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 Arnoldo Morale on 09/18/20 and anemia presented with upper back pain, decreased appetite and black stool.  EGD showed esophageal mass; biopsy consistent with poorly differentiated carcinoma.  CT of the abdomen showed possible mass; colonoscopy showed sigmoid obstructing mass.  He underwent Hartman's procedure with end colostomy on 09/30/2020.  Oncology and radiation oncology were consulted.  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  Diet advancement as per general surgery.  Wound care and diet as per general surgery recommendations. -Change IV fluids to normal saline at 75 cc an hour.  New diagnosis of poorly differentiated esophageal carcinoma -Status post EGD on 09/26/2020. -Oncology and radiation oncology following.  Outpatient follow-up with radiation oncology. -Patient will need port placement prior to discharge as per oncology.  His general surgery will contemplate placing that once patient is more stable and closer to discharge. -Palliative care had evaluated the patient during this hospitalization.  Patient remains full code at this time.  Metastatic prostate cancer Postsurgical back pain -S/p radical prostatectomy and radiation in 2015 -Thoracic spine tumor with cord compression status post T5-6 laminectomy with posterior lateral arthrodesis from T4-T8 by Dr. Arnoldo Morale on  09/18/2020 -Radiation oncology is recommending urology consult for androgen deprivation therapy.  Communicated with Dr. Macdiarmid/urology on 10/05/2020 who saw the patient on 10/05/2020 and recommended outpatient follow-up with Dr. Tresa Moore. -Outpatient follow-up with neurosurgery  Pyuria/bacteriuria -Currently being monitored off antibiotics  Leukocytosis -Resolved  Anemia of chronic disease -From cancer.  Hemoglobin stable  Hyponatremia -IV fluids plan as above.  Hyperkalemia -DC current IV fluids with supplemental potassium.  Repeat BMP now.  Generalized deconditioning -Will eventually need SNF placement  DVT prophylaxis: Lovenox Code Status: Full Family Communication: None at bedside Disposition Plan: Status is: Inpatient  Remains inpatient appropriate because:Inpatient level of care appropriate due to severity of illness  Dispo: The patient is from: Home              Anticipated d/c is to: SNF              Patient currently is not medically stable to d/c.   Difficult to place patient No  Consultants: GI/general surgery/oncology/radiation oncology/palliative care  Procedures: EGD/colonoscopy Hartman's procedure with end colostomy and G-tube placement on 09/30/2020  Antimicrobials:  Anti-infectives (From admission, onward)    Start     Dose/Rate Route Frequency Ordered Stop   09/30/20 0900  cefoTEtan (CEFOTAN) 2 g in sodium chloride 0.9 % 100 mL IVPB        2 g 200 mL/hr over 30 Minutes Intravenous On call to O.R. 09/30/20 0800 09/30/20 1421        Subjective: Patient seen and examined at bedside.  No fever, vomiting, worsening shortness of breath reported. Objective: Vitals:   10/06/20 0347 10/06/20 1323 10/06/20 2053 10/07/20 0545  BP: (!) 169/83 (!) 160/79 136/67 123/66  Pulse: 78 73 72 75  Resp: 18  18 18  Temp: 98 F (36.7 C) 98.2 F (36.8 C) 98.4 F (36.9 C) 98.9 F (37.2 C)  TempSrc: Oral Oral Oral Oral  SpO2: 97% 99% 98% 99%  Weight:       Height:        Intake/Output Summary (Last 24 hours) at 10/07/2020 0751 Last data filed at 10/07/2020 0600 Gross per 24 hour  Intake 893.88 ml  Output 750 ml  Net 143.88 ml    Filed Weights   09/25/20 0617 09/26/20 1245 09/28/20 0800  Weight: 70.7 kg 74.8 kg 74.8 kg    Examination:  General exam: No acute distress.  Currently on room air.  Looks chronically ill and deconditioned.   Respiratory system: Bilateral decreased breath sounds at bases with some scattered crackles  cardiovascular system: S1-S2 heard, rate controlled  gastrointestinal system: Abdomen is distended slightly, soft and mildly tender.  G-tube and colostomy bag present.  Normal bowel sounds heard.   Extremities: No cyanosis, edema  Central nervous system: Awake, slow to respond.  Poor historian.  No focal neurological deficits.  Moving extremities skin: No obvious petechiae/rashes  psychiatry: Affect is flat.  Data Reviewed: I have personally reviewed following labs and imaging studies  CBC: Recent Labs  Lab 10/03/20 0442 10/04/20 0507 10/05/20 0435 10/06/20 0458 10/07/20 0447  WBC 7.5 12.6* 9.9 4.3 4.1  HGB 9.5* 8.5* 8.8* 8.5* 9.2*  HCT 29.0* 26.2* 27.7* 26.3* 28.5*  MCV 75.7* 77.1* 78.2* 78.5* 77.7*  PLT 296 305 299 310 161    Basic Metabolic Panel: Recent Labs  Lab 10/03/20 0442 10/04/20 0507 10/05/20 0435 10/06/20 0458 10/07/20 0447  NA 132* 134* 130* 133* 126*  K 3.9 4.2 4.1 4.3 5.8*  CL 99 100 99 105 94*  CO2 25 26 22 23 24   GLUCOSE 141* 120* 114* 118* 117*  BUN <5* 7* 6* 5* <5*  CREATININE 0.67 0.85 0.67 0.77 1.00  CALCIUM 8.6* 8.4* 8.3* 8.5* 8.4*  MG 2.0 2.0 2.0 2.2 2.0    GFR: Estimated Creatinine Clearance: 68.9 mL/min (by C-G formula based on SCr of 1 mg/dL). Liver Function Tests: No results for input(s): AST, ALT, ALKPHOS, BILITOT, PROT, ALBUMIN in the last 168 hours. No results for input(s): LIPASE, AMYLASE in the last 168 hours. No results for input(s): AMMONIA in the  last 168 hours. Coagulation Profile: No results for input(s): INR, PROTIME in the last 168 hours. Cardiac Enzymes: No results for input(s): CKTOTAL, CKMB, CKMBINDEX, TROPONINI in the last 168 hours. BNP (last 3 results) No results for input(s): PROBNP in the last 8760 hours. HbA1C: No results for input(s): HGBA1C in the last 72 hours. CBG: Recent Labs  Lab 09/30/20 2032  GLUCAP 138*    Lipid Profile: No results for input(s): CHOL, HDL, LDLCALC, TRIG, CHOLHDL, LDLDIRECT in the last 72 hours. Thyroid Function Tests: No results for input(s): TSH, T4TOTAL, FREET4, T3FREE, THYROIDAB in the last 72 hours. Anemia Panel: No results for input(s): VITAMINB12, FOLATE, FERRITIN, TIBC, IRON, RETICCTPCT in the last 72 hours. Sepsis Labs: No results for input(s): PROCALCITON, LATICACIDVEN in the last 168 hours.  No results found for this or any previous visit (from the past 240 hour(s)).       Radiology Studies: No results found.      Scheduled Meds:  acetaminophen  1,000 mg Oral Q6H   Chlorhexidine Gluconate Cloth  6 each Topical Daily   enoxaparin (LOVENOX) injection  40 mg Subcutaneous Q24H   feeding supplement  1 Container Oral TID BM  pantoprazole  40 mg Oral BID   vitamin B-12  500 mcg Oral Daily   Continuous Infusions:  sodium chloride     methocarbamol (ROBAXIN) IV 500 mg (10/07/20 0529)          Aline August, MD [facility] 10/07/2020, 7:51 AM

## 2020-10-07 NOTE — Progress Notes (Signed)
7 Days Post-Op  Subjective: Patient is having a lot of nausea and increased abdominal distention this morning.  No colostomy function yet.  Afebrile, no tachycardia.   Objective: Vital signs in last 24 hours: Temp:  [98.4 F (36.9 C)-98.9 F (37.2 C)] 98.9 F (37.2 C) (09/23 0545) Pulse Rate:  [72-79] 79 (09/23 1200) Resp:  [18] 18 (09/23 0545) BP: (123-136)/(65-67) 124/65 (09/23 1200) SpO2:  [97 %-99 %] 97 % (09/23 1200) Last BM Date:  (UTA)  Intake/Output from previous day: 09/22 0701 - 09/23 0700 In: 893.9 [P.O.:240; I.V.:553.9; IV Piggyback:100] Out: 750 [Urine:650; Drains:100] Intake/Output this shift: No intake/output data recorded.  PE:  Abd: Distended, minimally tender, G-tube clamped.  JP drain with serous fluid.  Midline incision with staples.  Colostomy with viable and pink stoma, no gas or stool in bag.  Lab Results:  Recent Labs    10/06/20 0458 10/07/20 0447  WBC 4.3 4.1  HGB 8.5* 9.2*  HCT 26.3* 28.5*  PLT 310 362   BMET Recent Labs    10/07/20 0447 10/07/20 0730  NA 126* 132*  K 5.8* 4.5  CL 94* 102  CO2 24 22  GLUCOSE 117* 103*  BUN <5* <5*  CREATININE 1.00 0.86  CALCIUM 8.4* 8.7*   PT/INR No results for input(s): LABPROT, INR in the last 72 hours. CMP     Component Value Date/Time   NA 132 (L) 10/07/2020 0730   K 4.5 10/07/2020 0730   CL 102 10/07/2020 0730   CO2 22 10/07/2020 0730   GLUCOSE 103 (H) 10/07/2020 0730   BUN <5 (L) 10/07/2020 0730   CREATININE 0.86 10/07/2020 0730   CREATININE 1.01 09/15/2020 1415   CALCIUM 8.7 (L) 10/07/2020 0730   PROT 6.3 (L) 09/25/2020 1304   ALBUMIN 3.1 (L) 09/25/2020 1304   AST 20 09/25/2020 1304   AST 13 (L) 09/15/2020 1415   ALT 26 09/25/2020 1304   ALT 11 09/15/2020 1415   ALKPHOS 67 09/25/2020 1304   BILITOT 0.9 09/25/2020 1304   BILITOT 0.7 09/15/2020 1415   GFRNONAA >60 10/07/2020 0730   GFRNONAA >60 09/15/2020 1415   GFRAA >60 09/17/2018 0607   Lipase     Component Value  Date/Time   LIPASE 36 09/14/2018 1838       Studies/Results: DG Abd 2 Views  Result Date: 10/07/2020 CLINICAL DATA:  75 year old male with history of recent gastrointestinal surgery. Postoperative ileus. EXAM: ABDOMEN - 2 VIEW COMPARISON:  Abdominal radiograph 10/02/2020. FINDINGS: Stomach is not distended. There is a paucity of colonic gas, however, there is some gas in the splenic flexure of the colon. Numerous markedly dilated loops of gas-filled small bowel are noted throughout the central abdomen, which demonstrate multiple air-fluid levels on the upright projection. Loops are dilated up to 4.7 cm in diameter, substantially worsened compared to the prior study. Midline skin staples are noted in the lower pelvis. Surgical drain extending into the low anatomic pelvis. No definite pneumoperitoneum. Status post right hip arthroplasty. IMPRESSION: 1. Worsening postoperative ileus, as above. Electronically Signed   By: Vinnie Langton M.D.   On: 10/07/2020 13:55    Anti-infectives: Anti-infectives (From admission, onward)    Start     Dose/Rate Route Frequency Ordered Stop   09/30/20 0900  cefoTEtan (CEFOTAN) 2 g in sodium chloride 0.9 % 100 mL IVPB        2 g 200 mL/hr over 30 Minutes Intravenous On call to O.R. 09/30/20 0800 09/30/20 1421  Assessment/Plan POD 6, s/p lap assisted Hartmann's with g-tube placement by Dr. Wilson 09/30/20 for Obstructing sigmoid colon mass -Abdominal plain film today shows significant diffuse small bowel dilation.  Will place G-tube to gravity drainage. -Patient is now a week out from surgery with no bowel function, will obtain CT scan for further assessment. 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 Jenkins   LOS: 10 days   Shelby Allen, MD [facility], Hepatobiliary and Pancreatic Surgery 10/07/20 2:35 PM   

## 2020-10-07 NOTE — Progress Notes (Signed)
   10/07/20 1400  Mobility  Activity Dangled on edge of bed  Mobility performed by Mobility specialist;Nurse  $Mobility charge 1 Mobility   Pt got EOB to use voiding container and returned to laying down. He refused further mobility or transferring to the chair.    [place] Specialist Acute Rehab Services Office: [phone]

## 2020-10-08 DIAGNOSIS — E871 Hypo-osmolality and hyponatremia: Secondary | ICD-10-CM | POA: Diagnosis not present

## 2020-10-08 DIAGNOSIS — C187 Malignant neoplasm of sigmoid colon: Secondary | ICD-10-CM | POA: Diagnosis not present

## 2020-10-08 DIAGNOSIS — Z7189 Other specified counseling: Secondary | ICD-10-CM | POA: Diagnosis not present

## 2020-10-08 DIAGNOSIS — K2289 Other specified disease of esophagus: Secondary | ICD-10-CM | POA: Diagnosis not present

## 2020-10-08 DIAGNOSIS — Z515 Encounter for palliative care: Secondary | ICD-10-CM | POA: Diagnosis not present

## 2020-10-08 DIAGNOSIS — C154 Malignant neoplasm of middle third of esophagus: Secondary | ICD-10-CM | POA: Diagnosis not present

## 2020-10-08 LAB — CBC
HCT: 28.7 % — ABNORMAL LOW (ref 39.0–52.0)
Hemoglobin: 9.2 g/dL — ABNORMAL LOW (ref 13.0–17.0)
MCH: 24.8 pg — ABNORMAL LOW (ref 26.0–34.0)
MCHC: 32.1 g/dL (ref 30.0–36.0)
MCV: 77.4 fL — ABNORMAL LOW (ref 80.0–100.0)
Platelets: 376 10*3/uL (ref 150–400)
RBC: 3.71 MIL/uL — ABNORMAL LOW (ref 4.22–5.81)
RDW: 18.9 % — ABNORMAL HIGH (ref 11.5–15.5)
WBC: 4 10*3/uL (ref 4.0–10.5)
nRBC: 0 % (ref 0.0–0.2)

## 2020-10-08 LAB — BASIC METABOLIC PANEL
Anion gap: 9 (ref 5–15)
BUN: 7 mg/dL — ABNORMAL LOW (ref 8–23)
CO2: 23 mmol/L (ref 22–32)
Calcium: 8.7 mg/dL — ABNORMAL LOW (ref 8.9–10.3)
Chloride: 102 mmol/L (ref 98–111)
Creatinine, Ser: 0.95 mg/dL (ref 0.61–1.24)
GFR, Estimated: >60 mL/min (ref >60)
Glucose, Bld: 107 mg/dL — ABNORMAL HIGH (ref 70–99)
Potassium: 4 mmol/L (ref 3.5–5.1)
Sodium: 134 mmol/L — ABNORMAL LOW (ref 135–145)

## 2020-10-08 LAB — HEPATIC FUNCTION PANEL
ALT: 46 U/L — ABNORMAL HIGH (ref 0–44)
AST: 20 U/L (ref 15–41)
Albumin: 2.6 g/dL — ABNORMAL LOW (ref 3.5–5.0)
Alkaline Phosphatase: 201 U/L — ABNORMAL HIGH (ref 38–126)
Bilirubin, Direct: 0.1 mg/dL (ref 0.0–0.2)
Indirect Bilirubin: 0.5 mg/dL (ref 0.3–0.9)
Total Bilirubin: 0.6 mg/dL (ref 0.3–1.2)
Total Protein: 6.3 g/dL — ABNORMAL LOW (ref 6.5–8.1)

## 2020-10-08 LAB — MAGNESIUM: Magnesium: 2 mg/dL (ref 1.7–2.4)

## 2020-10-08 NOTE — Progress Notes (Signed)
Daily Progress Note   Patient Name: Marcus [name]       Date: 10/08/2020 DOB: [birth year]  Age: 75 y.o. MRN#: [id number] Attending Physician: Marcus August, MD Primary Care Physician: Marcus Nova, MD Admit Date: 09/25/2020  Reason for Consultation/Follow-up: Establishing goals of care  Subjective: Patient's nurse informed me that family was visiting and wanting to discuss his current status and had questions regarding staging of his cancer, etc.  I saw and examined Marcus [name].  He was lying in bed at time of my encounter but appeared to be feeling much better than he had when I saw him yesterday afternoon.  His brother in law, Marcus [name], was present as well.  We discussed the multiple comorbidities he is facing as well as the fact that he has multiple cancer diagnoses.  Marcus [name] had questions regarding different cancer diagnoses and stages and we talked about the fact that he has known metastatic disease to his liver, however, it is not clear if this is from esophageal, colon, or prostate.    Discussed options for care moving forward including continuation of aggressive interventions versus consideration for focusing more on aggressive symptom management and quality of life.  Marcus [name] is hopeful to get well enough to leave the hospital, go to rehab to regain strength, and he reports today that he eventually wants to move to [place] to live with his brother at which point in time he would like to pursue further treatment for his cancer.  Marcus [name] expressed appreciation for update and will update the rest of Marcus [name] family.  Length of Stay: 11  Current Medications: Scheduled Meds:  . acetaminophen  1,000 mg Oral Q6H  . Chlorhexidine Gluconate Cloth  6 each Topical Daily  . enoxaparin  (LOVENOX) injection  40 mg Subcutaneous Q24H  . feeding supplement  1 Container Oral TID BM  . pantoprazole  40 mg Oral BID  . vitamin B-12  500 mcg Oral Daily    Continuous Infusions: . sodium chloride 75 mL/hr at 10/08/20 1354  . methocarbamol (ROBAXIN) IV 500 mg (10/08/20 1358)    PRN Meds: HYDROmorphone (DILAUDID) injection, ondansetron **OR** ondansetron (ZOFRAN) IV, oxyCODONE  Physical Exam         General: Alert, awake, in no acute distress.   HEENT: No bruits, no goiter, no  JVD Heart: Regular rate and rhythm. No murmur appreciated. Lungs: Good air movement, clear Abdomen: Soft, nontender, nondistended, positive bowel sounds. + g tube (not hooked up to drainage)   Ext: No significant edema Skin: Warm and dry Neuro: Grossly intact, nonfocal.   Vital Signs: BP (!) 147/79 (BP Location: Right Arm)   Pulse 78   Temp 97.8 F (36.6 C) (Oral)   Resp 19   Ht 5\' 10"  (1.778 m)   Wt 74.8 kg   SpO2 100%   BMI 23.66 kg/m  SpO2: SpO2: 100 % O2 Device: O2 Device: Room Air O2 Flow Rate: O2 Flow Rate (L/min): 2 L/min  Intake/output summary:  Intake/Output Summary (Last 24 hours) at 10/08/2020 1421 Last data filed at 10/08/2020 0800 Gross per 24 hour  Intake 605 ml  Output 820 ml  Net -215 ml    LBM: Last BM Date:  (UTA) Baseline Weight: Weight: 70.7 kg Most recent weight: Weight: 74.8 kg       Palliative Assessment/Data:      Patient Active Problem List   Diagnosis Date Noted  . Esophageal mass   . Cancer of sigmoid colon [facility]) pending confirmatory biopsy   . Goals of care, counseling/discussion   . 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[place]) 09/27/2020  . GI bleed 09/25/2020  . Bone metastases ([place])   . 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 Thoracic spine tumor 09/17/2020  . Hyponatremia 09/14/2018  . Hypokalemia 09/14/2018  . ARF (acute renal failure) ([place]) 09/14/2018  . Acute lower UTI 09/14/2018  . 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  He is status post colostomy and continues to have obstruction.  Recommendations/Plan: Full code/full scope He has named his brother as his surrogate Media planner. We had another family meeting today to update on clinical course and confirm desire for continued aggressive interventions.  Today, Marcus [name] reports that he wants to continue with any and all offered interventions.  He would eventually like to move to [place] to live with his brother as he pursues treatment for his multiple cancer diagnoses. No other palliative specific recommendations at this point.  We will continue to follow peripherally but not round on Marcus [name] daily.  Please call if there are palliative specific needs with which our team can be of assistance.  Goals of Care and Additional Recommendations: Limitations on Scope of Treatment: Full Scope Treatment  Code Status:    Code Status Orders  (From admission, onward)           Start     Ordered   09/25/20 1427  Full code  Continuous        09/25/20 1428           Code Status History     Date Active Date Inactive Code Status Order ID Comments User Context   09/18/2020 2013 09/22/2020 2218 Full Code [id number]  Newman Pies, MD Inpatient   09/17/2020 1841 09/18/2020 2013 Full Code [id number]  Newman Pies, MD ED   09/15/2018 0055 09/17/2018 1637 Full Code [id number]  Elwyn Reach, MD Inpatient   05/15/2016 1115 05/16/2016 1802 Full Code [id number]  Norman Herrlich Inpatient   09/04/2013 1919 09/05/2013 1504 Full Code [id number]  Margo Aye,  MD Inpatient       Prognosis:  Unable to determine  Discharge Planning: To Be Determined  Care plan was discussed with patient  Thank you for allowing the  Palliative Medicine Team to assist in the care of this patient.   Total Time 40 minutes Prolonged Time Billed  no   Greater than 50%  of this time was spent counseling and coordinating care related to the above assessment and plan.  Marcus Rough, MD  Please contact Palliative Medicine Team phone at [phone] for questions and concerns.

## 2020-10-08 NOTE — Progress Notes (Signed)
8 Days Post-Op  Subjective: CT scan shows ongoing small bowel dilation but no fluid collections.  Colostomy productive of stool and gas this morning.  Patient endorses continued nausea.  No drainage from G-tube.   Objective: Vital signs in last 24 hours: Temp:  [97.3 F (36.3 C)-98.6 F (37 C)] 97.8 F (36.6 C) (09/24 0533) Pulse Rate:  [75-79] 77 (09/24 0533) Resp:  [20] 20 (09/24 0533) BP: (124-167)/(65-82) 135/71 (09/24 0533) SpO2:  [95 %-98 %] 98 % (09/24 0533) Last BM Date:  (UTA)  Intake/Output from previous day: 09/23 0701 - 09/24 0700 In: 605 [I.V.:505; IV Piggyback:100] Out: 790 [Urine:700; Drains:90] Intake/Output this shift: No intake/output data recorded.  PE:  Abd: Distended, minimally tender, G-tube clamped, connected to bag with connector that has no lumen.  JP drain with serous fluid.  Midline incision with staples.  Colostomy productive of loose stool and gas.  Lab Results:  Recent Labs    10/07/20 0447 10/08/20 0515  WBC 4.1 4.0  HGB 9.2* 9.2*  HCT 28.5* 28.7*  PLT 362 376   BMET Recent Labs    10/07/20 0730 10/08/20 0515  NA 132* 134*  K 4.5 4.0  CL 102 102  CO2 22 23  GLUCOSE 103* 107*  BUN <5* 7*  CREATININE 0.86 0.95  CALCIUM 8.7* 8.7*   PT/INR No results for input(s): LABPROT, INR in the last 72 hours. CMP     Component Value Date/Time   NA 134 (L) 10/08/2020 0515   K 4.0 10/08/2020 0515   CL 102 10/08/2020 0515   CO2 23 10/08/2020 0515   GLUCOSE 107 (H) 10/08/2020 0515   BUN 7 (L) 10/08/2020 0515   CREATININE 0.95 10/08/2020 0515   CREATININE 1.01 09/15/2020 1415   CALCIUM 8.7 (L) 10/08/2020 0515   PROT 6.3 (L) 09/25/2020 1304   ALBUMIN 3.1 (L) 09/25/2020 1304   AST 20 09/25/2020 1304   AST 13 (L) 09/15/2020 1415   ALT 26 09/25/2020 1304   ALT 11 09/15/2020 1415   ALKPHOS 67 09/25/2020 1304   BILITOT 0.9 09/25/2020 1304   BILITOT 0.7 09/15/2020 1415   GFRNONAA >60 10/08/2020 0515   GFRNONAA >60 09/15/2020 1415    GFRAA >60 09/17/2018 0607   Lipase     Component Value Date/Time   LIPASE 36 09/14/2018 1838       Studies/Results: CT ABDOMEN PELVIS W CONTRAST  Result Date: 10/07/2020 CLINICAL DATA:  Abdominal pain, fever, colon cancer status post sigmoid colectomy. Remote history of prostate cancer with osseous metastases. EXAM: CT ABDOMEN AND PELVIS WITH CONTRAST TECHNIQUE: Multidetector CT imaging of the abdomen and pelvis was performed using the standard protocol following bolus administration of intravenous contrast. CONTRAST:  49m OMNIPAQUE IOHEXOL 350 MG/ML SOLN COMPARISON:  None. FINDINGS: Lower chest: Scattered areas of subsegmental atelectasis are noted within the visualized lung bases. More focal consolidation within the peripheral basilar right middle lobe is nonspecific, best seen on image # 36/4, possibly infectious or inflammatory in nature. Trace pericardial fluid is stable. Cardiac size within normal limits. Hepatobiliary: Scattered hepatic hypodensities are again identified and are indeterminate. Hypoenhancing mass within the right hepatic lobe most compatible with a hepatic metastasis is unchanged measuring 2.6 x 3.2 cm at axial image # 20/2. There is interval development of mild intra and extrahepatic biliary ductal dilation, new since prior examination, as well as distention of the gallbladder. The extrahepatic bile duct measures 10-11 mm in diameter, previously measuring 7 mm. Extrahepatic biliary ductal dilation  extends to the level of the ampulla. No definite intraluminal filling defect is identified. No pericholecystic inflammatory changes seen. Pancreas: Unremarkable Spleen: Unremarkable Adrenals/Urinary Tract: The adrenal glands are unremarkable. The kidneys are normal in size and position. Multiple simple cortical cysts are seen within the left kidney. The kidneys are otherwise unremarkable. The bladder is partially obscured by streak artifact from right total hip arthroplasty,  however, appears decompressed. Stomach/Bowel: Interval gastrostomy, sigmoid colectomy, left lower quadrant descending colostomy, and Hartmann pouch formation. Right lower quadrant surgical drainage catheter extends into the deep pelvis. The proximal small bowel appears diffusely dilated and fluid-filled to the level of the distal jejunum where this demonstrates gradual transition to decompressed small bowel, best seen on coronal image # 47/5 and axial image # 59/2. There is no obstructing mass identified in this location. The terminal ileum and large bowel appears decompressed. No free intraperitoneal gas. No free or loculated fluid noted within the peritoneum. The appendix is unremarkable. Vascular/Lymphatic: Partially necrotic periceliac and gastrohepatic lymphadenopathy is unchanged with the index lymph node measuring 1.3 x 2.0 cm at axial image # 23-2. Retroperitoneal adenopathy within the left periaortic and aortocaval lymph node groups appears stable. There is extensive aortoiliac atherosclerotic calcification. No aortic aneurysm. Ulcerated atheromatous plaque again noted within the left common iliac artery with a a concomitant greater than 75% stenosis of this vessel. Roughly 50% focal stenosis of the right common iliac artery again noted. Reproductive: Status post prostatectomy. Other: Mild presacral edema is likely postoperative in nature. Laparotomy skin staples noted within the infraumbilical midline. Tiny fat containing umbilical and small bilateral fat containing inguinal hernias are present. Subcutaneous gas within the right abdominal wall may relate to subcutaneous injection or may be postoperative in nature. 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 Right total hip arthroplasty has been performed. Thoracic fusion hardware is partially visualized. No acute bone abnormality. IMPRESSION: Diffusely dilated  fluid-filled proximal small bowel with gradual transition to decompressed ileum most in keeping with a postoperative adynamic ileus. Surgical changes in keeping with percutaneous gastrostomy, sigmoid colectomy, descending colostomy, and Hartmann pouch formation. No evidence of surgical complication. Interval development of intra and extrahepatic biliary dilation. Correlation with liver enzymes would be helpful to exclude a obstructive process. ERCP or MRCP examination may be helpful for further evaluation if abnormal. Stable findings of metastatic disease with pathologic retroperitoneal adenopathy and dominant hepatic metastasis within the right hepatic lobe. Additional scattered hypodensities within the liver are too small to accurately characterize and would be better assessed with contrast enhanced MRI examination once the patient is clinically able to do so. Bibasilar segmental atelectasis. More focal consolidation within the basilar right middle lobe is nonspecific and may reflect a superimposed focal infectious or inflammatory process. Peripheral vascular disease. 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 Aortic Atherosclerosis (ICD10-I70.0). Electronically Signed   By: Fidela Salisbury M.D.   On: 10/07/2020 21:02   DG Abd 2 Views  Result Date: 10/07/2020 CLINICAL DATA:  75 year old male with history of recent gastrointestinal surgery. Postoperative ileus. EXAM: ABDOMEN - 2 VIEW COMPARISON:  Abdominal radiograph 10/02/2020. FINDINGS: Stomach is not distended. There is a paucity of colonic gas, however, there is some gas in the splenic flexure of the colon. Numerous markedly dilated loops of gas-filled small bowel are noted throughout the central abdomen, which demonstrate multiple air-fluid levels on the upright projection. Loops are dilated up to 4.7 cm  in diameter, substantially worsened compared to the prior study. Midline skin staples are noted in the lower  pelvis. Surgical drain extending into the low anatomic pelvis. No definite pneumoperitoneum. Status post right hip arthroplasty. IMPRESSION: 1. Worsening postoperative ileus, as above. Electronically Signed   By: Vinnie Langton M.D.   On: 10/07/2020 13:55    Anti-infectives: Anti-infectives (From admission, onward)    Start     Dose/Rate Route Frequency Ordered Stop   09/30/20 0900  cefoTEtan (CEFOTAN) 2 g in sodium chloride 0.9 % 100 mL IVPB        2 g 200 mL/hr over 30 Minutes Intravenous On call to O.R. 09/30/20 0800 09/30/20 1421        Assessment/Plan POD 6, s/p lap assisted Hartmann's with g-tube placement by Dr. Redmond Pulling 09/30/20 for Obstructing sigmoid colon mass -G tube improperly connected to gravity and has actually been clamped. I connected it to gravity drainage with return of air and fluid. Keep to gravity today given ongoing distension. - CT scan shows persistent ileus, no fluid collections. Colostomy is now functional. - Keep NPO today, anticipate distension will improve now that colostomy is functioning. -WOC following for new colostomy -needs to mobilize, PT/OT eval, recommending SNF  FEN -n.p.o., maintenance IV fluids VTE - Lovenox ID - none  Circumferential non-obstructing esophageal mass - s/p EGD 9/12 and path with poorly differentiated carcinoma - given metastatic disease, esophagectomy or resection seems unlikely to be an option  -g-tube placed in case he needs it moving forward Metastatic prostate cancer - s/p radical prostatectomy and radiation therapy in 2015/2016 - s/p laminectomy for 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 Arnoldo Morale   LOS: 11 days   Michaelle Birks, MD [facility] General, Hepatobiliary and Pancreatic Surgery 10/08/20 8:17 AM

## 2020-10-08 NOTE — Progress Notes (Signed)
Patient ID: Marcus [name], male   DOB: 07-Mar-1945, 75 y.o.   MRN: [id number]  PROGRESS NOTE    TIQUAN BOUCH  GDJ:[id number] DOB: 10-14-1945 DOA: 09/25/2020 PCP: [name], MD   Brief Narrative:  75 y.o. 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 Arnoldo Morale on 09/18/20 and anemia presented with upper back pain, decreased appetite and black stool.  EGD showed esophageal mass; biopsy consistent with poorly differentiated carcinoma.  CT of the abdomen showed possible mass; colonoscopy showed sigmoid obstructing mass.  He underwent Hartman's procedure with end colostomy on 09/30/2020.  Oncology and radiation oncology were consulted.  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  X-ray of abdomen and CT of abdomen and pelvis on 10/07/2020 showed postoperative adynamic ileus with interval development of intra and extrahepatic biliary dilatation  -wound care as per general surgery recommendations. -Continue IV fluids.  New diagnosis of poorly differentiated esophageal carcinoma -Status post EGD on 09/26/2020. -Oncology and radiation oncology following.  Outpatient follow-up with radiation oncology. -Patient will need port placement prior to discharge as per oncology.  His general surgery will contemplate placing that once patient is more stable and closer to discharge. -Palliative care had evaluated the patient during this hospitalization.  Patient remains full code at this time.  Palliative care following intermittently.  Intra and extrahepatic biliary dilatation -As seen on CT of the abdomen on 10/07/2020.  Unclear if this is from  hepatic metastasis.  We will check LFTs.  If LFTs are elevated, might have to have GI input.  Metastatic prostate cancer Postsurgical back pain -S/p radical prostatectomy and radiation in 2015 -Thoracic spine tumor with cord compression status post T5-6 laminectomy with posterior lateral arthrodesis from T4-T8 by Dr. Arnoldo Morale on 09/18/2020 -Radiation oncology is recommending urology consult for androgen deprivation therapy.  Communicated with Dr. Macdiarmid/urology on 10/05/2020 who saw the patient on 10/05/2020 and recommended outpatient follow-up with Dr. Tresa Moore. -Outpatient follow-up with neurosurgery  Pyuria/bacteriuria -Currently being monitored off antibiotics  Leukocytosis -Resolved  Anemia of chronic disease -From cancer.  Hemoglobin stable  Hyponatremia -Improving.  IV fluids plan as above.  Hyperkalemia -Resolved.  Generalized deconditioning -Will eventually need SNF placement  DVT prophylaxis: Lovenox Code Status: Full Family Communication: None at bedside Disposition Plan: Status is: Inpatient  Remains inpatient appropriate because:Inpatient level of care appropriate due to severity of illness  Dispo: The patient is from: Home              Anticipated d/c is to: SNF              Patient currently is not medically stable to d/c.   Difficult to place patient No  Consultants: GI/general surgery/oncology/radiation oncology/palliative care  Procedures: EGD/colonoscopy Hartman's procedure with end colostomy and G-tube placement on 09/30/2020  Antimicrobials:  Anti-infectives (From admission, onward)    Start     Dose/Rate Route Frequency Ordered Stop   09/30/20 0900  cefoTEtan (CEFOTAN) 2 g in sodium chloride 0.9 % 100 mL IVPB        2 g 200 mL/hr over 30 Minutes Intravenous On call to O.R. 09/30/20 0800 09/30/20 1421        Subjective: Patient seen and  examined at bedside.  Complains of intermittent nausea.  No overnight chest pain, fever reported.    Objective: Vitals:   10/07/20 1200 10/07/20 1827 10/07/20 2309 10/08/20 0533  BP: 124/65 (!) 167/82 129/67 135/71  Pulse: 79 76 75 77  Resp:  20 20   Temp:  (!) 97.3 F (36.3 C) 98.6 F (37 C) 97.8 F (36.6 C)  TempSrc:  Oral Oral Oral  SpO2: 97% 95% 96% 98%  Weight:      Height:        Intake/Output Summary (Last 24 hours) at 10/08/2020 0756 Last data filed at 10/08/2020 0603 Gross per 24 hour  Intake 605 ml  Output 540 ml  Net 65 ml    Filed Weights   09/25/20 0617 09/26/20 1245 09/28/20 0800  Weight: 70.7 kg 74.8 kg 74.8 kg    Examination:  General exam: On room air currently.  No distress.  Looks chronically ill and deconditioned.   Respiratory system: Decreased breath sounds at bases bilaterally with some crackles  cardiovascular system: Rate controlled, S1-S2 heard  gastrointestinal system: Abdomen is mildly distended, soft and mildly tender.  G-tube and colostomy bag present.  Bowel sounds sluggish.  Extremities: No edema or clubbing. Central nervous system: Slow to respond; alert.  Poor historian.  No focal neurological deficits.  Moves extremities.   Skin: No obvious ecchymosis/lesions  psychiatry: Flat affect  Data Reviewed: I have personally reviewed following labs and imaging studies  CBC: Recent Labs  Lab 10/04/20 0507 10/05/20 0435 10/06/20 0458 10/07/20 0447 10/08/20 0515  WBC 12.6* 9.9 4.3 4.1 4.0  HGB 8.5* 8.8* 8.5* 9.2* 9.2*  HCT 26.2* 27.7* 26.3* 28.5* 28.7*  MCV 77.1* 78.2* 78.5* 77.7* 77.4*  PLT 305 299 310 362 638    Basic Metabolic Panel: Recent Labs  Lab 10/04/20 0507 10/05/20 0435 10/06/20 0458 10/07/20 0447 10/07/20 0730 10/08/20 0515  NA 134* 130* 133* 126* 132* 134*  K 4.2 4.1 4.3 5.8* 4.5 4.0  CL 100 99 105 94* 102 102  CO2 26 22 23 24 22 23   GLUCOSE 120* 114* 118* 117* 103* 107*  BUN 7* 6* 5* <5* <5* 7*  CREATININE 0.85 0.67 0.77 1.00 0.86 0.95  CALCIUM 8.4* 8.3* 8.5* 8.4* 8.7* 8.7*  MG 2.0 2.0 2.2 2.0  --  2.0     GFR: Estimated Creatinine Clearance: 72.6 mL/min (by C-G formula based on SCr of 0.95 mg/dL). Liver Function Tests: No results for input(s): AST, ALT, ALKPHOS, BILITOT, PROT, ALBUMIN in the last 168 hours. No results for input(s): LIPASE, AMYLASE in the last 168 hours. No results for input(s): AMMONIA in the last 168 hours. Coagulation Profile: No results for input(s): INR, PROTIME in the last 168 hours. Cardiac Enzymes: No results for input(s): CKTOTAL, CKMB, CKMBINDEX, TROPONINI in the last 168 hours. BNP (last 3 results) No results for input(s): PROBNP in the last 8760 hours. HbA1C: No results for input(s): HGBA1C in the last 72 hours. CBG: No results for input(s): GLUCAP in the last 168 hours.  Lipid Profile: No results for input(s): CHOL, HDL, LDLCALC, TRIG, CHOLHDL, LDLDIRECT in the last 72 hours. Thyroid Function Tests: No results for input(s): TSH, T4TOTAL, FREET4, T3FREE, THYROIDAB in the last 72 hours. Anemia Panel: No results for input(s): VITAMINB12, FOLATE, FERRITIN, TIBC, IRON, RETICCTPCT in the last 72 hours. Sepsis Labs: No results for input(s): PROCALCITON, LATICACIDVEN in the last 168 hours.  No results found for this or any previous visit (from the past 240 hour(s)).  Radiology Studies: CT ABDOMEN PELVIS W CONTRAST  Result Date: 10/07/2020 CLINICAL DATA:  Abdominal pain, fever, colon cancer status post sigmoid colectomy. Remote history of prostate cancer with osseous metastases. EXAM: CT ABDOMEN AND PELVIS WITH CONTRAST TECHNIQUE: Multidetector CT imaging of the abdomen and pelvis was performed using the standard protocol following bolus administration of intravenous contrast. CONTRAST:  64mL OMNIPAQUE IOHEXOL 350 MG/ML SOLN COMPARISON:  None. FINDINGS: Lower chest: Scattered areas of subsegmental atelectasis are noted within the visualized lung bases. More focal consolidation within the peripheral basilar right middle lobe is nonspecific, best seen on  image # 36/4, possibly infectious or inflammatory in nature. Trace pericardial fluid is stable. Cardiac size within normal limits. Hepatobiliary: Scattered hepatic hypodensities are again identified and are indeterminate. Hypoenhancing mass within the right hepatic lobe most compatible with a hepatic metastasis is unchanged measuring 2.6 x 3.2 cm at axial image # 20/2. There is interval development of mild intra and extrahepatic biliary ductal dilation, new since prior examination, as well as distention of the gallbladder. The extrahepatic bile duct measures 10-11 mm in diameter, previously measuring 7 mm. Extrahepatic biliary ductal dilation extends to the level of the ampulla. No definite intraluminal filling defect is identified. No pericholecystic inflammatory changes seen. Pancreas: Unremarkable Spleen: Unremarkable Adrenals/Urinary Tract: The adrenal glands are unremarkable. The kidneys are normal in size and position. Multiple simple cortical cysts are seen within the left kidney. The kidneys are otherwise unremarkable. The bladder is partially obscured by streak artifact from right total hip arthroplasty, however, appears decompressed. Stomach/Bowel: Interval gastrostomy, sigmoid colectomy, left lower quadrant descending colostomy, and Hartmann pouch formation. Right lower quadrant surgical drainage catheter extends into the deep pelvis. The proximal small bowel appears diffusely dilated and fluid-filled to the level of the distal jejunum where this demonstrates gradual transition to decompressed small bowel, best seen on coronal image # 47/5 and axial image # 59/2. There is no obstructing mass identified in this location. The terminal ileum and large bowel appears decompressed. No free intraperitoneal gas. No free or loculated fluid noted within the peritoneum. The appendix is unremarkable. Vascular/Lymphatic: Partially necrotic periceliac and gastrohepatic lymphadenopathy is unchanged with the index lymph  node measuring 1.3 x 2.0 cm at axial image # 23-2. Retroperitoneal adenopathy within the left periaortic and aortocaval lymph node groups appears stable. There is extensive aortoiliac atherosclerotic calcification. No aortic aneurysm. Ulcerated atheromatous plaque again noted within the left common iliac artery with a a concomitant greater than 75% stenosis of this vessel. Roughly 50% focal stenosis of the right common iliac artery again noted. Reproductive: Status post prostatectomy. Other: Mild presacral edema is likely postoperative in nature. Laparotomy skin staples noted within the infraumbilical midline. Tiny fat containing umbilical and small bilateral fat containing inguinal hernias are present. Subcutaneous gas within the right abdominal wall may relate to subcutaneous injection or may be postoperative in nature. 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 Right total hip arthroplasty has been performed. Thoracic fusion hardware is partially visualized. No acute bone abnormality. IMPRESSION: Diffusely dilated fluid-filled proximal small bowel with gradual transition to decompressed ileum most in keeping with a postoperative adynamic ileus. Surgical changes in keeping with percutaneous gastrostomy, sigmoid colectomy, descending colostomy, and Hartmann pouch formation. No evidence of surgical complication. Interval development of intra and extrahepatic biliary dilation. Correlation with liver enzymes would be helpful to exclude a obstructive process. ERCP or MRCP examination may be helpful for further evaluation if  abnormal. Stable findings of metastatic disease with pathologic retroperitoneal adenopathy and dominant hepatic metastasis within the right hepatic lobe. Additional scattered hypodensities within the liver are too small to accurately characterize and would be better assessed with contrast enhanced MRI  examination once the patient is clinically able to do so. Bibasilar segmental atelectasis. More focal consolidation within the basilar right middle lobe is nonspecific and may reflect a superimposed focal infectious or inflammatory process. Peripheral vascular disease. 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 Aortic Atherosclerosis (ICD10-I70.0). Electronically Signed   By: Fidela Salisbury M.D.   On: 10/07/2020 21:02   DG Abd 2 Views  Result Date: 10/07/2020 CLINICAL DATA:  75 year old male with history of recent gastrointestinal surgery. Postoperative ileus. EXAM: ABDOMEN - 2 VIEW COMPARISON:  Abdominal radiograph 10/02/2020. FINDINGS: Stomach is not distended. There is a paucity of colonic gas, however, there is some gas in the splenic flexure of the colon. Numerous markedly dilated loops of gas-filled small bowel are noted throughout the central abdomen, which demonstrate multiple air-fluid levels on the upright projection. Loops are dilated up to 4.7 cm in diameter, substantially worsened compared to the prior study. Midline skin staples are noted in the lower pelvis. Surgical drain extending into the low anatomic pelvis. No definite pneumoperitoneum. Status post right hip arthroplasty. IMPRESSION: 1. Worsening postoperative ileus, as above. Electronically Signed   By: Vinnie Langton M.D.   On: 10/07/2020 13:55        Scheduled Meds:  acetaminophen  1,000 mg Oral Q6H   Chlorhexidine Gluconate Cloth  6 each Topical Daily   enoxaparin (LOVENOX) injection  40 mg Subcutaneous Q24H   feeding supplement  1 Container Oral TID BM   pantoprazole  40 mg Oral BID   vitamin B-12  500 mcg Oral Daily   Continuous Infusions:  sodium chloride 75 mL/hr at 10/07/20 1016   methocarbamol (ROBAXIN) IV 500 mg (10/08/20 0603)          Aline August, MD [facility] 10/08/2020, 7:56 AM

## 2020-10-08 NOTE — Progress Notes (Signed)
Daily Progress Note   Patient Name: Marcus [name]       Date: 10/08/2020 DOB: Feb 27, 1945  Age: 75 y.o. MRN#: [id number] Attending Physician: Aline August, MD Primary Care Physician: [name], MD Admit Date: 09/25/2020  Reason for Consultation/Follow-up: Establishing goals of care  Subjective: I saw and examined Marcus [name].  He was lying in bed at time of my encounter.  He was feeling nauseous after finishing contrast for CT that is planned for this afternoon.  We again reviewed his clinical course and multiple comorbidities.  Discussed options for care moving forward including continuation of aggressive interventions versus consideration for focusing more on aggressive symptom management and quality of life.  He is very clear in his desire to continue with any and all offered interventions.  We also discussed surrogate decision making.  He has decided to his name his brother is his [facility] POA.  Appreciate chaplain support.  He was feeling nauseous at time of my encounter.  This morning, plan was noted for him to have G-tube hooked up to gravity drainage.  This is currently not done as he is having contrast for ordered CT.  Discussed with bedside staff to ensure that G-tube is hooked back to drainage after CT is performed.  Length of Stay: 11  Current Medications: Scheduled Meds:  . acetaminophen  1,000 mg Oral Q6H  . Chlorhexidine Gluconate Cloth  6 each Topical Daily  . enoxaparin (LOVENOX) injection  40 mg Subcutaneous Q24H  . feeding supplement  1 Container Oral TID BM  . pantoprazole  40 mg Oral BID  . vitamin B-12  500 mcg Oral Daily    Continuous Infusions: . sodium chloride 75 mL/hr at 10/07/20 1016  . methocarbamol (ROBAXIN) IV 500 mg (10/08/20 0603)    PRN  Meds: HYDROmorphone (DILAUDID) injection, ondansetron **OR** ondansetron (ZOFRAN) IV, oxyCODONE  Physical Exam         General: Alert, awake, in no acute distress.   HEENT: No bruits, no goiter, no JVD Heart: Regular rate and rhythm. No murmur appreciated. Lungs: Good air movement, clear Abdomen: Soft, nontender, nondistended, positive bowel sounds. + g tube (not hooked up to drainage)   Ext: No significant edema Skin: Warm and dry Neuro: Grossly intact, nonfocal.   Vital Signs: BP 135/71 (BP Location: Right Arm)  Pulse 77   Temp 97.8 F (36.6 C) (Oral)   Resp 20   Ht 5\' 10"  (1.778 m)   Wt 74.8 kg   SpO2 98%   BMI 23.66 kg/m  SpO2: SpO2: 98 % O2 Device: O2 Device: Room Air O2 Flow Rate: O2 Flow Rate (L/min): 2 L/min  Intake/output summary:  Intake/Output Summary (Last 24 hours) at 10/08/2020 0943 Last data filed at 10/08/2020 3976 Gross per 24 hour  Intake 605 ml  Output 790 ml  Net -185 ml   LBM: Last BM Date:  (UTA) Baseline Weight: Weight: 70.7 kg Most recent weight: Weight: 74.8 kg       Palliative Assessment/Data:      Patient Active Problem List   Diagnosis Date Noted  . Esophageal mass   . Cancer of sigmoid colon [facility]) pending confirmatory biopsy   . Goals of care, counseling/discussion   . 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[place]) 09/27/2020  . GI bleed 09/25/2020  . Bone metastases ([place])   . Cord compression ([place])   . Thoracic spine tumor 09/17/2020  . Hyponatremia 09/14/2018  . Hypokalemia 09/14/2018  . ARF (acute renal failure) ([place]) 09/14/2018  . Acute lower UTI 09/14/2018  . 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  He is status post colostomy and continues to have  obstruction.  Recommendations/Plan: Full code/full scope-I discussed and confirmed with him continued desire for any and all offered interventions. He has named his brother as his [employer] traffic [employer]. He was very nauseous at time of my encounter.  He did receive Zofran.  This is likely due to contrast as he is going for CT.  Discussed with RN to ensure that he is hooked back up to G-tube drainage on return from CT. No other palliative specific recommendations at this point.  We will continue to follow peripherally but not round on Mr. Marcus [name] daily.  Please call if there are palliative specific needs with which our team can be of assistance.  Goals of Care and Additional Recommendations: Limitations on Scope of Treatment: Full Scope Treatment  Code Status:    Code Status Orders  (From admission, onward)           Start     Ordered   09/25/20 1427  Full code  Continuous        09/25/20 1428           Code Status History     Date Active Date Inactive Code Status Order ID Comments User Context   09/18/2020 2013 09/22/2020 2218 Full Code [id number]  Newman Pies, MD Inpatient   09/17/2020 1841 09/18/2020 2013 Full Code [id number]  Newman Pies, MD ED   09/15/2018 0055 09/17/2018 1637 Full Code [id number]  Elwyn Reach, MD Inpatient   05/15/2016 1115 05/16/2016 1802 Full Code [id number]  Norman Herrlich Inpatient   09/04/2013 1919 09/05/2013 1504 Full Code [id number]  Margo Aye, MD Inpatient       Prognosis:  Unable to determine  Discharge Planning: To Be Determined  Care plan was discussed with patient  Thank you for allowing the Palliative Medicine Team to assist in the care of this patient.   Total Time 45 minutes Prolonged Time Billed  no  Greater than 50%  of this time was spent counseling and coordinating care related to the above assessment and plan.  Micheline Rough, MD  Please contact Palliative Medicine Team phone at [phone] for questions  and concerns.

## 2020-10-09 DIAGNOSIS — C187 Malignant neoplasm of sigmoid colon: Secondary | ICD-10-CM | POA: Diagnosis not present

## 2020-10-09 DIAGNOSIS — C154 Malignant neoplasm of middle third of esophagus: Secondary | ICD-10-CM | POA: Diagnosis not present

## 2020-10-09 DIAGNOSIS — K2289 Other specified disease of esophagus: Secondary | ICD-10-CM | POA: Diagnosis not present

## 2020-10-09 LAB — CBC WITH DIFFERENTIAL/PLATELET
Abs Immature Granulocytes: 0.12 10*3/uL — ABNORMAL HIGH (ref 0.00–0.07)
Basophils Absolute: 0 10*3/uL (ref 0.0–0.1)
Basophils Relative: 0 %
Eosinophils Absolute: 0.1 10*3/uL (ref 0.0–0.5)
Eosinophils Relative: 2 %
HCT: 26.6 % — ABNORMAL LOW (ref 39.0–52.0)
Hemoglobin: 8.4 g/dL — ABNORMAL LOW (ref 13.0–17.0)
Immature Granulocytes: 3 %
Lymphocytes Relative: 24 %
Lymphs Abs: 0.9 10*3/uL (ref 0.7–4.0)
MCH: 24.4 pg — ABNORMAL LOW (ref 26.0–34.0)
MCHC: 31.6 g/dL (ref 30.0–36.0)
MCV: 77.3 fL — ABNORMAL LOW (ref 80.0–100.0)
Monocytes Absolute: 0.7 10*3/uL (ref 0.1–1.0)
Monocytes Relative: 17 %
Neutro Abs: 2 10*3/uL (ref 1.7–7.7)
Neutrophils Relative %: 54 %
Platelets: 364 10*3/uL (ref 150–400)
RBC: 3.44 MIL/uL — ABNORMAL LOW (ref 4.22–5.81)
RDW: 19 % — ABNORMAL HIGH (ref 11.5–15.5)
WBC: 3.8 10*3/uL — ABNORMAL LOW (ref 4.0–10.5)
nRBC: 0 % (ref 0.0–0.2)

## 2020-10-09 LAB — COMPREHENSIVE METABOLIC PANEL
ALT: 29 U/L (ref 0–44)
AST: 14 U/L — ABNORMAL LOW (ref 15–41)
Albumin: 2.5 g/dL — ABNORMAL LOW (ref 3.5–5.0)
Alkaline Phosphatase: 153 U/L — ABNORMAL HIGH (ref 38–126)
Anion gap: 8 (ref 5–15)
BUN: 8 mg/dL (ref 8–23)
CO2: 20 mmol/L — ABNORMAL LOW (ref 22–32)
Calcium: 8.2 mg/dL — ABNORMAL LOW (ref 8.9–10.3)
Chloride: 107 mmol/L (ref 98–111)
Creatinine, Ser: 0.86 mg/dL (ref 0.61–1.24)
GFR, Estimated: >60 mL/min (ref >60)
Glucose, Bld: 78 mg/dL (ref 70–99)
Potassium: 3.8 mmol/L (ref 3.5–5.1)
Sodium: 135 mmol/L (ref 135–145)
Total Bilirubin: 1 mg/dL (ref 0.3–1.2)
Total Protein: 5.7 g/dL — ABNORMAL LOW (ref 6.5–8.1)

## 2020-10-09 LAB — MAGNESIUM: Magnesium: 2 mg/dL (ref 1.7–2.4)

## 2020-10-09 NOTE — Progress Notes (Signed)
Patient ID: Marcus [name], male   DOB: 1945-01-22, 75 y.o.   MRN: [id number]  PROGRESS NOTE    GARRIS MELHORN  LKG:[id number] DOB: 1945-10-05 DOA: 09/25/2020 PCP: [name], MD   Brief Narrative:  75 y.o. 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 Arnoldo Morale on 09/18/20 and anemia presented with upper back pain, decreased appetite and black stool.  EGD showed esophageal mass; biopsy consistent with poorly differentiated carcinoma.  CT of the abdomen showed possible mass; colonoscopy showed sigmoid obstructing mass.  He underwent Hartman's procedure with end colostomy on 09/30/2020.  Palliative care, oncology and radiation oncology were consulted.  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  X-ray of abdomen and CT of abdomen and pelvis on 10/07/2020 showed postoperative adynamic ileus with interval development of intra and extrahepatic biliary dilatation  -Diet advancement as per general surgery. -wound care as per general surgery recommendations. -Continue IV fluids.  New diagnosis of poorly differentiated esophageal carcinoma -Status post EGD on 09/26/2020. -Oncology and radiation oncology following.  Outpatient follow-up with radiation oncology. -Patient will need port placement prior to discharge as per oncology.  General surgery will contemplate placing that once patient is more stable and closer to discharge. -Palliative care had evaluated the patient during this hospitalization.  Patient remains full code at this time.  Palliative care following intermittently.  Intra and extrahepatic biliary dilatation -As seen on CT  of the abdomen on 10/07/2020.  Unclear if this is from hepatic metastasis.  LFTs not elevated.  Will monitor intermittently with  Metastatic prostate cancer Postsurgical back pain -S/p radical prostatectomy and radiation in 2015 -Thoracic spine tumor with cord compression status post T5-6 laminectomy with posterior lateral arthrodesis from T4-T8 by Dr. Arnoldo Morale on 09/18/2020 -Radiation oncology is recommending urology consult for androgen deprivation therapy.  Communicated with Dr. Macdiarmid/urology on 10/05/2020 who saw the patient on 10/05/2020 and recommended outpatient follow-up with Dr. Tresa Moore. -Outpatient follow-up with neurosurgery  Pyuria/bacteriuria -Currently being monitored off antibiotics  Leukocytosis -Resolved  Anemia of chronic disease -From cancer.  Hemoglobin stable  Hyponatremia -Improving.  IV fluids plan as above.  Hyperkalemia -Resolved.  Generalized deconditioning -Will eventually need SNF placement  DVT prophylaxis: Lovenox Code Status: Full Family Communication: None at bedside Disposition Plan: Status is: Inpatient  Remains inpatient appropriate because:Inpatient level of care appropriate due to severity of illness  Dispo: The patient is from: Home              Anticipated d/c is to: SNF              Patient currently is not medically stable to d/c.   Difficult to place patient No  Consultants: GI/general surgery/oncology/radiation oncology/palliative care  Procedures: EGD/colonoscopy Hartman's procedure with end colostomy and G-tube placement on 09/30/2020  Antimicrobials:  Anti-infectives (From admission, onward)    Start     Dose/Rate Route Frequency Ordered Stop   09/30/20 0900  cefoTEtan (CEFOTAN) 2 g in sodium chloride 0.9 % 100 mL IVPB        2 g 200 mL/hr over 30 Minutes Intravenous On call to O.R. 09/30/20 0800 09/30/20 1421        Subjective: Patient seen and  examined at bedside.  Still complains of intermittent nausea.  No fever,  chest pain, worsening shortness of breath reported.  Objective: Vitals:   10/08/20 0533 10/08/20 1327 10/08/20 2052 10/09/20 0523  BP: 135/71 (!) 147/79 (!) 146/66 (!) 153/80  Pulse: 77 78 70 80  Resp: 20 19 20 16   Temp: 97.8 F (36.6 C) 97.8 F (36.6 C) 98.2 F (36.8 C) 98.2 F (36.8 C)  TempSrc: Oral Oral Oral Oral  SpO2: 98% 100% 100% 97%  Weight:      Height:        Intake/Output Summary (Last 24 hours) at 10/09/2020 0825 Last data filed at 10/09/2020 0500 Gross per 24 hour  Intake 150 ml  Output 1080 ml  Net -930 ml    Filed Weights   09/25/20 0617 09/26/20 1245 09/28/20 0800  Weight: 70.7 kg 74.8 kg 74.8 kg    Examination:  General exam: No distress.  Currently on room air.  Looks chronically ill and deconditioned.   Respiratory system: Bilateral decreased breath sounds at bases with scattered crackles  cardiovascular system: S1-S2 heard, rate controlled gastrointestinal system: Abdomen is distended slightly, soft and mildly tender.  G-tube and colostomy bag present.  Bowel sounds  extremities: No cyanosis or clubbing  Central nervous system: Awake; still slow to respond.  Poor historian.  No focal neurological deficits.  Moving extremities Skin: No obvious petechiae/rashes psychiatry: Affect is flat  Data Reviewed: I have personally reviewed following labs and imaging studies  CBC: Recent Labs  Lab 10/05/20 0435 10/06/20 0458 10/07/20 0447 10/08/20 0515 10/09/20 0508  WBC 9.9 4.3 4.1 4.0 3.8*  NEUTROABS  --   --   --   --  2.0  HGB 8.8* 8.5* 9.2* 9.2* 8.4*  HCT 27.7* 26.3* 28.5* 28.7* 26.6*  MCV 78.2* 78.5* 77.7* 77.4* 77.3*  PLT 299 310 362 376 749    Basic Metabolic Panel: Recent Labs  Lab 10/05/20 0435 10/06/20 0458 10/07/20 0447 10/07/20 0730 10/08/20 0515 10/09/20 0508  NA 130* 133* 126* 132* 134* 135  K 4.1 4.3 5.8* 4.5 4.0 3.8  CL 99 105 94* 102 102 107  CO2 22 23 24 22 23  20*  GLUCOSE 114* 118* 117* 103* 107* 78  BUN 6* 5* <5* <5*  7* 8  CREATININE 0.67 0.77 1.00 0.86 0.95 0.86  CALCIUM 8.3* 8.5* 8.4* 8.7* 8.7* 8.2*  MG 2.0 2.2 2.0  --  2.0 2.0    GFR: Estimated Creatinine Clearance: 80.2 mL/min (by C-G formula based on SCr of 0.86 mg/dL). Liver Function Tests: Recent Labs  Lab 10/08/20 0515 10/09/20 0508  AST 20 14*  ALT 46* 29  ALKPHOS 201* 153*  BILITOT 0.6 1.0  PROT 6.3* 5.7*  ALBUMIN 2.6* 2.5*   No results for input(s): LIPASE, AMYLASE in the last 168 hours. No results for input(s): AMMONIA in the last 168 hours. Coagulation Profile: No results for input(s): INR, PROTIME in the last 168 hours. Cardiac Enzymes: No results for input(s): CKTOTAL, CKMB, CKMBINDEX, TROPONINI in the last 168 hours. BNP (last 3 results) No results for input(s): PROBNP in the last 8760 hours. HbA1C: No results for input(s): HGBA1C in the last 72 hours. CBG: No results for input(s): GLUCAP in the last 168 hours.  Lipid Profile: No results for input(s): CHOL, HDL, LDLCALC, TRIG, CHOLHDL, LDLDIRECT in the last 72 hours. Thyroid Function Tests: No results for input(s): TSH, T4TOTAL, FREET4, T3FREE, THYROIDAB in the last 72 hours. Anemia Panel: No results for input(s): VITAMINB12,  FOLATE, FERRITIN, TIBC, IRON, RETICCTPCT in the last 72 hours. Sepsis Labs: No results for input(s): PROCALCITON, LATICACIDVEN in the last 168 hours.  No results found for this or any previous visit (from the past 240 hour(s)).       Radiology Studies: CT ABDOMEN PELVIS W CONTRAST  Result Date: 10/07/2020 CLINICAL DATA:  Abdominal pain, fever, colon cancer status post sigmoid colectomy. Remote history of prostate cancer with osseous metastases. EXAM: CT ABDOMEN AND PELVIS WITH CONTRAST TECHNIQUE: Multidetector CT imaging of the abdomen and pelvis was performed using the standard protocol following bolus administration of intravenous contrast. CONTRAST:  36mL OMNIPAQUE IOHEXOL 350 MG/ML SOLN COMPARISON:  None. FINDINGS: Lower chest: Scattered  areas of subsegmental atelectasis are noted within the visualized lung bases. More focal consolidation within the peripheral basilar right middle lobe is nonspecific, best seen on image # 36/4, possibly infectious or inflammatory in nature. Trace pericardial fluid is stable. Cardiac size within normal limits. Hepatobiliary: Scattered hepatic hypodensities are again identified and are indeterminate. Hypoenhancing mass within the right hepatic lobe most compatible with a hepatic metastasis is unchanged measuring 2.6 x 3.2 cm at axial image # 20/2. There is interval development of mild intra and extrahepatic biliary ductal dilation, new since prior examination, as well as distention of the gallbladder. The extrahepatic bile duct measures 10-11 mm in diameter, previously measuring 7 mm. Extrahepatic biliary ductal dilation extends to the level of the ampulla. No definite intraluminal filling defect is identified. No pericholecystic inflammatory changes seen. Pancreas: Unremarkable Spleen: Unremarkable Adrenals/Urinary Tract: The adrenal glands are unremarkable. The kidneys are normal in size and position. Multiple simple cortical cysts are seen within the left kidney. The kidneys are otherwise unremarkable. The bladder is partially obscured by streak artifact from right total hip arthroplasty, however, appears decompressed. Stomach/Bowel: Interval gastrostomy, sigmoid colectomy, left lower quadrant descending colostomy, and Hartmann pouch formation. Right lower quadrant surgical drainage catheter extends into the deep pelvis. The proximal small bowel appears diffusely dilated and fluid-filled to the level of the distal jejunum where this demonstrates gradual transition to decompressed small bowel, best seen on coronal image # 47/5 and axial image # 59/2. There is no obstructing mass identified in this location. The terminal ileum and large bowel appears decompressed. No free intraperitoneal gas. No free or loculated  fluid noted within the peritoneum. The appendix is unremarkable. Vascular/Lymphatic: Partially necrotic periceliac and gastrohepatic lymphadenopathy is unchanged with the index lymph node measuring 1.3 x 2.0 cm at axial image # 23-2. Retroperitoneal adenopathy within the left periaortic and aortocaval lymph node groups appears stable. There is extensive aortoiliac atherosclerotic calcification. No aortic aneurysm. Ulcerated atheromatous plaque again noted within the left common iliac artery with a a concomitant greater than 75% stenosis of this vessel. Roughly 50% focal stenosis of the right common iliac artery again noted. Reproductive: Status post prostatectomy. Other: Mild presacral edema is likely postoperative in nature. Laparotomy skin staples noted within the infraumbilical midline. Tiny fat containing umbilical and small bilateral fat containing inguinal hernias are present. Subcutaneous gas within the right abdominal wall may relate to subcutaneous injection or may be postoperative in nature. 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 Right total hip arthroplasty has been performed. Thoracic fusion hardware is partially visualized. No acute bone abnormality. IMPRESSION: Diffusely dilated fluid-filled proximal small bowel with gradual transition to decompressed ileum most in keeping with a postoperative adynamic ileus. Surgical changes in keeping with percutaneous gastrostomy,  sigmoid colectomy, descending colostomy, and Hartmann pouch formation. No evidence of surgical complication. Interval development of intra and extrahepatic biliary dilation. Correlation with liver enzymes would be helpful to exclude a obstructive process. ERCP or MRCP examination may be helpful for further evaluation if abnormal. Stable findings of metastatic disease with pathologic retroperitoneal adenopathy and dominant hepatic  metastasis within the right hepatic lobe. Additional scattered hypodensities within the liver are too small to accurately characterize and would be better assessed with contrast enhanced MRI examination once the patient is clinically able to do so. Bibasilar segmental atelectasis. More focal consolidation within the basilar right middle lobe is nonspecific and may reflect a superimposed focal infectious or inflammatory process. Peripheral vascular disease. 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 Aortic Atherosclerosis (ICD10-I70.0). Electronically Signed   By: Fidela Salisbury M.D.   On: 10/07/2020 21:02   DG Abd 2 Views  Result Date: 10/07/2020 CLINICAL DATA:  75 year old male with history of recent gastrointestinal surgery. Postoperative ileus. EXAM: ABDOMEN - 2 VIEW COMPARISON:  Abdominal radiograph 10/02/2020. FINDINGS: Stomach is not distended. There is a paucity of colonic gas, however, there is some gas in the splenic flexure of the colon. Numerous markedly dilated loops of gas-filled small bowel are noted throughout the central abdomen, which demonstrate multiple air-fluid levels on the upright projection. Loops are dilated up to 4.7 cm in diameter, substantially worsened compared to the prior study. Midline skin staples are noted in the lower pelvis. Surgical drain extending into the low anatomic pelvis. No definite pneumoperitoneum. Status post right hip arthroplasty. IMPRESSION: 1. Worsening postoperative ileus, as above. Electronically Signed   By: Vinnie Langton M.D.   On: 10/07/2020 13:55        Scheduled Meds:  acetaminophen  1,000 mg Oral Q6H   Chlorhexidine Gluconate Cloth  6 each Topical Daily   enoxaparin (LOVENOX) injection  40 mg Subcutaneous Q24H   feeding supplement  1 Container Oral TID BM   pantoprazole  40 mg Oral BID   vitamin B-12  500 mcg Oral Daily   Continuous Infusions:  sodium chloride 75 mL/hr at 10/09/20 0500    methocarbamol (ROBAXIN) IV 500 mg (10/09/20 0601)          Aline August, MD [facility] 10/09/2020, 8:25 AM

## 2020-10-09 NOTE — Progress Notes (Signed)
Occupational Therapy Treatment Patient Details Name: Marcus [name] MRN: [id number] DOB: 10-12-1945 Today's Date: 10/09/2020   History of present illness 75 yo male admitted with GI bleed, anemia. 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 Arnoldo Morale on 9/4,  R THA, S/P colostomy 09/30/20   OT comments  Patient complaining of stomach pain initially and then back pain with standing at sink. Overall patient supervision for bed mobility, min guard for ambulation with walker and min guard for toileting and standing grooming task. Patient's tolerance limited by pain and he is slightly unsteady. He was able to report 2/3 spinal precautions and perform log roll out of bed but needed cues for back to bed. He spends most of his time in bed - needs encouragement to stay out of bed.   Recommendations for follow up therapy are one component of a multi-disciplinary discharge planning process, led by the attending physician.  Recommendations may be updated based on patient status, additional functional criteria and insurance authorization.    Follow Up Recommendations  SNF    Equipment Recommendations  None recommended by OT    Recommendations for Other Services      Precautions / Restrictions Precautions Precautions: Fall;Back;Other (comment) Precaution Comments: now has colostomy, cannot wear brace, R JP; reviewed log roll technique Required Braces or Orthoses: Spinal Brace Restrictions Weight Bearing Restrictions: No       Mobility Bed Mobility Overal bed mobility: Needs Assistance   Rolling: Supervision Sidelying to sit: Supervision     Sit to sidelying: Supervision General bed mobility comments: He did well with supine to sit - needing increased time. needs verbal cues for log roll technique back in to bed.    Transfers Overall transfer level: Needs assistance Equipment used:  Rolling walker (2 wheeled) Transfers: Sit to/from Stand Sit to Stand: Min guard         General transfer comment: Min guard to ambulate with walker. Is limited by pain.    Balance Overall balance assessment: Mild deficits observed, not formally tested                                         ADL either performed or assessed with clinical judgement   ADL Overall ADL's : Needs assistance/impaired Eating/Feeding: Independent   Grooming: Wash/dry face;Wash/dry hands;Supervision/safety;Standing Grooming Details (indicate cue type and reason): Min guard at sink to wash face and some partial upper body bathing. Approx 3 min. reports  back pain with prolonged standing.                     Toileting- Water quality scientist and Hygiene: Min guard Toileting - Clothing Manipulation Details (indicate cue type and reason): Patient stood for urinating - min guard only.             Vision Patient Visual Report: No change from baseline     Perception     Praxis      Cognition Arousal/Alertness: Awake/alert Behavior During Therapy: WFL for tasks assessed/performed Overall Cognitive Status: Within Functional Limits for tasks assessed                                          Exercises  Shoulder Instructions       General Comments      Pertinent Vitals/ Pain       Pain Assessment: Faces Pain Score: 6  Faces Pain Scale: Hurts whole lot Pain Location: colostomy site and back Pain Descriptors / Indicators: Moaning Pain Intervention(s): Monitored during session;Premedicated before session  Home Living                                          Prior Functioning/Environment              Frequency  Min 2X/week        Progress Toward Goals  OT Goals(current goals can now be found in the care plan section)  Progress towards OT goals: Progressing toward goals  Acute Rehab OT Goals Patient Stated Goal:  less pain, regain IND OT Goal Formulation: With patient Time For Goal Achievement: 10/10/20 Potential to Achieve Goals: Good  Plan Discharge plan remains appropriate    Co-evaluation          OT goals addressed during session: ADL's and self-care      AM-PAC OT "6 Clicks" Daily Activity     Outcome Measure   Help from another person eating meals?: None Help from another person taking care of personal grooming?: A Little Help from another person toileting, which includes using toliet, bedpan, or urinal?: A Little Help from another person bathing (including washing, rinsing, drying)?: A Little Help from another person to put on and taking off regular upper body clothing?: A Little Help from another person to put on and taking off regular lower body clothing?: A Lot 6 Click Score: 18    End of Session Equipment Utilized During Treatment: Rolling walker  OT Visit Diagnosis: Unsteadiness on feet (R26.81);Other abnormalities of gait and mobility (R26.89);Muscle weakness (generalized) (M62.81);Pain   Activity Tolerance Patient limited by pain   Patient Left in bed;with call bell/phone within reach;with bed alarm set   Nurse Communication Mobility status        Time: 1355-1413 OT Time Calculation (min): 18 min  Charges: OT General Charges $OT Visit: 1 Visit OT Treatments $Self Care/Home Management : 8-22 mins  [name], OTR/L [place]  Office [phone] Pager: [place] 10/09/2020, 4:02 PM

## 2020-10-09 NOTE — Progress Notes (Signed)
9 Days Post-Op  Subjective: Improved, abdomen is much softer today.  Colostomy is functional.   Objective: Vital signs in last 24 hours: Temp:  [97.8 F (36.6 C)-98.2 F (36.8 C)] 98.2 F (36.8 C) (09/25 0523) Pulse Rate:  [70-80] 80 (09/25 0523) Resp:  [16-20] 16 (09/25 0523) BP: (146-153)/(66-80) 153/80 (09/25 0523) SpO2:  [97 %-100 %] 97 % (09/25 0523) Last BM Date:  (UTA)  Intake/Output from previous day: 09/24 0701 - 09/25 0700 In: 150 [IV Piggyback:150] Out: 1110 [Urine:380; Drains:330; Stool:400] Intake/Output this shift: No intake/output data recorded.  PE: General: And oriented, NAD Resp: Normal work of breathing on room air Abd: Soft, nondistended, nontender to palpation.  JP drain with serous fluid.  Midline incision clean and dry with staples.  Colostomy productive of stool and gas.  G-tube to gravity drainage with gastric contents.  Lab Results:  Recent Labs    10/08/20 0515 10/09/20 0508  WBC 4.0 3.8*  HGB 9.2* 8.4*  HCT 28.7* 26.6*  PLT 376 364   BMET Recent Labs    10/08/20 0515 10/09/20 0508  NA 134* 135  K 4.0 3.8  CL 102 107  CO2 23 20*  GLUCOSE 107* 78  BUN 7* 8  CREATININE 0.95 0.86  CALCIUM 8.7* 8.2*   PT/INR No results for input(s): LABPROT, INR in the last 72 hours. CMP     Component Value Date/Time   NA 135 10/09/2020 0508   K 3.8 10/09/2020 0508   CL 107 10/09/2020 0508   CO2 20 (L) 10/09/2020 0508   GLUCOSE 78 10/09/2020 0508   BUN 8 10/09/2020 0508   CREATININE 0.86 10/09/2020 0508   CREATININE 1.01 09/15/2020 1415   CALCIUM 8.2 (L) 10/09/2020 0508   PROT 5.7 (L) 10/09/2020 0508   ALBUMIN 2.5 (L) 10/09/2020 0508   AST 14 (L) 10/09/2020 0508   AST 13 (L) 09/15/2020 1415   ALT 29 10/09/2020 0508   ALT 11 09/15/2020 1415   ALKPHOS 153 (H) 10/09/2020 0508   BILITOT 1.0 10/09/2020 0508   BILITOT 0.7 09/15/2020 1415   GFRNONAA >60 10/09/2020 0508   GFRNONAA >60 09/15/2020 1415   GFRAA >60 09/17/2018 0607    Lipase     Component Value Date/Time   LIPASE 36 09/14/2018 1838       Studies/Results: CT ABDOMEN PELVIS W CONTRAST  Result Date: 10/07/2020 CLINICAL DATA:  Abdominal pain, fever, colon cancer status post sigmoid colectomy. Remote history of prostate cancer with osseous metastases. EXAM: CT ABDOMEN AND PELVIS WITH CONTRAST TECHNIQUE: Multidetector CT imaging of the abdomen and pelvis was performed using the standard protocol following bolus administration of intravenous contrast. CONTRAST:  67m OMNIPAQUE IOHEXOL 350 MG/ML SOLN COMPARISON:  None. FINDINGS: Lower chest: Scattered areas of subsegmental atelectasis are noted within the visualized lung bases. More focal consolidation within the peripheral basilar right middle lobe is nonspecific, best seen on image # 36/4, possibly infectious or inflammatory in nature. Trace pericardial fluid is stable. Cardiac size within normal limits. Hepatobiliary: Scattered hepatic hypodensities are again identified and are indeterminate. Hypoenhancing mass within the right hepatic lobe most compatible with a hepatic metastasis is unchanged measuring 2.6 x 3.2 cm at axial image # 20/2. There is interval development of mild intra and extrahepatic biliary ductal dilation, new since prior examination, as well as distention of the gallbladder. The extrahepatic bile duct measures 10-11 mm in diameter, previously measuring 7 mm. Extrahepatic biliary ductal dilation extends to the level of the ampulla. No  definite intraluminal filling defect is identified. No pericholecystic inflammatory changes seen. Pancreas: Unremarkable Spleen: Unremarkable Adrenals/Urinary Tract: The adrenal glands are unremarkable. The kidneys are normal in size and position. Multiple simple cortical cysts are seen within the left kidney. The kidneys are otherwise unremarkable. The bladder is partially obscured by streak artifact from right total hip arthroplasty, however, appears decompressed.  Stomach/Bowel: Interval gastrostomy, sigmoid colectomy, left lower quadrant descending colostomy, and Hartmann pouch formation. Right lower quadrant surgical drainage catheter extends into the deep pelvis. The proximal small bowel appears diffusely dilated and fluid-filled to the level of the distal jejunum where this demonstrates gradual transition to decompressed small bowel, best seen on coronal image # 47/5 and axial image # 59/2. There is no obstructing mass identified in this location. The terminal ileum and large bowel appears decompressed. No free intraperitoneal gas. No free or loculated fluid noted within the peritoneum. The appendix is unremarkable. Vascular/Lymphatic: Partially necrotic periceliac and gastrohepatic lymphadenopathy is unchanged with the index lymph node measuring 1.3 x 2.0 cm at axial image # 23-2. Retroperitoneal adenopathy within the left periaortic and aortocaval lymph node groups appears stable. There is extensive aortoiliac atherosclerotic calcification. No aortic aneurysm. Ulcerated atheromatous plaque again noted within the left common iliac artery with a a concomitant greater than 75% stenosis of this vessel. Roughly 50% focal stenosis of the right common iliac artery again noted. Reproductive: Status post prostatectomy. Other: Mild presacral edema is likely postoperative in nature. Laparotomy skin staples noted within the infraumbilical midline. Tiny fat containing umbilical and small bilateral fat containing inguinal hernias are present. Subcutaneous gas within the right abdominal wall may relate to subcutaneous injection or may be postoperative in nature. 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 Right total hip arthroplasty has been performed. Thoracic fusion hardware is partially visualized. No acute bone abnormality. IMPRESSION: Diffusely dilated fluid-filled proximal small  bowel with gradual transition to decompressed ileum most in keeping with a postoperative adynamic ileus. Surgical changes in keeping with percutaneous gastrostomy, sigmoid colectomy, descending colostomy, and Hartmann pouch formation. No evidence of surgical complication. Interval development of intra and extrahepatic biliary dilation. Correlation with liver enzymes would be helpful to exclude a obstructive process. ERCP or MRCP examination may be helpful for further evaluation if abnormal. Stable findings of metastatic disease with pathologic retroperitoneal adenopathy and dominant hepatic metastasis within the right hepatic lobe. Additional scattered hypodensities within the liver are too small to accurately characterize and would be better assessed with contrast enhanced MRI examination once the patient is clinically able to do so. Bibasilar segmental atelectasis. More focal consolidation within the basilar right middle lobe is nonspecific and may reflect a superimposed focal infectious or inflammatory process. Peripheral vascular disease. 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 Aortic Atherosclerosis (ICD10-I70.0). Electronically Signed   By: Fidela Salisbury M.D.   On: 10/07/2020 21:02   DG Abd 2 Views  Result Date: 10/07/2020 CLINICAL DATA:  75 year old male with history of recent gastrointestinal surgery. Postoperative ileus. EXAM: ABDOMEN - 2 VIEW COMPARISON:  Abdominal radiograph 10/02/2020. FINDINGS: Stomach is not distended. There is a paucity of colonic gas, however, there is some gas in the splenic flexure of the colon. Numerous markedly dilated loops of gas-filled small bowel are noted throughout the central abdomen, which demonstrate multiple air-fluid levels on the upright projection. Loops are dilated up to 4.7 cm in diameter, substantially worsened compared to the prior  study. Midline skin staples are noted in the lower pelvis. Surgical drain extending  into the low anatomic pelvis. No definite pneumoperitoneum. Status post right hip arthroplasty. IMPRESSION: 1. Worsening postoperative ileus, as above. Electronically Signed   By: Vinnie Langton M.D.   On: 10/07/2020 13:55    Anti-infectives: Anti-infectives (From admission, onward)    Start     Dose/Rate Route Frequency Ordered Stop   09/30/20 0900  cefoTEtan (CEFOTAN) 2 g in sodium chloride 0.9 % 100 mL IVPB        2 g 200 mL/hr over 30 Minutes Intravenous On call to O.R. 09/30/20 0800 09/30/20 1421        Assessment/Plan S/p lap assisted Hartmann's with g-tube placement by Dr. Redmond Pulling 09/30/20 for Obstructing sigmoid colon mass -Colostomy is productive, abdomen is soft today, postoperative ileus has resolved. -Clamp G-tube and advance to clear liquid diet. 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 Arnoldo Morale   LOS: 12 days   Michaelle Birks, MD [facility] Surgery General, Hepatobiliary and Pancreatic Surgery 10/09/20 8:25 AM

## 2020-10-10 DIAGNOSIS — C187 Malignant neoplasm of sigmoid colon: Secondary | ICD-10-CM | POA: Diagnosis not present

## 2020-10-10 DIAGNOSIS — K2289 Other specified disease of esophagus: Secondary | ICD-10-CM | POA: Diagnosis not present

## 2020-10-10 DIAGNOSIS — C154 Malignant neoplasm of middle third of esophagus: Secondary | ICD-10-CM | POA: Diagnosis not present

## 2020-10-10 LAB — COMPREHENSIVE METABOLIC PANEL
ALT: 22 U/L (ref 0–44)
AST: 13 U/L — ABNORMAL LOW (ref 15–41)
Albumin: 2.4 g/dL — ABNORMAL LOW (ref 3.5–5.0)
Alkaline Phosphatase: 129 U/L — ABNORMAL HIGH (ref 38–126)
Anion gap: 6 (ref 5–15)
BUN: 5 mg/dL — ABNORMAL LOW (ref 8–23)
CO2: 23 mmol/L (ref 22–32)
Calcium: 8.2 mg/dL — ABNORMAL LOW (ref 8.9–10.3)
Chloride: 107 mmol/L (ref 98–111)
Creatinine, Ser: 0.93 mg/dL (ref 0.61–1.24)
GFR, Estimated: >60 mL/min (ref >60)
Glucose, Bld: 84 mg/dL (ref 70–99)
Potassium: 3.7 mmol/L (ref 3.5–5.1)
Sodium: 136 mmol/L (ref 135–145)
Total Bilirubin: 0.8 mg/dL (ref 0.3–1.2)
Total Protein: 5.5 g/dL — ABNORMAL LOW (ref 6.5–8.1)

## 2020-10-10 LAB — CBC WITH DIFFERENTIAL/PLATELET
Abs Immature Granulocytes: 0.12 10*3/uL — ABNORMAL HIGH (ref 0.00–0.07)
Basophils Absolute: 0 10*3/uL (ref 0.0–0.1)
Basophils Relative: 0 %
Eosinophils Absolute: 0.1 10*3/uL (ref 0.0–0.5)
Eosinophils Relative: 2 %
HCT: 25.8 % — ABNORMAL LOW (ref 39.0–52.0)
Hemoglobin: 8.2 g/dL — ABNORMAL LOW (ref 13.0–17.0)
Immature Granulocytes: 3 %
Lymphocytes Relative: 19 %
Lymphs Abs: 0.7 10*3/uL (ref 0.7–4.0)
MCH: 24.6 pg — ABNORMAL LOW (ref 26.0–34.0)
MCHC: 31.8 g/dL (ref 30.0–36.0)
MCV: 77.2 fL — ABNORMAL LOW (ref 80.0–100.0)
Monocytes Absolute: 0.7 10*3/uL (ref 0.1–1.0)
Monocytes Relative: 18 %
Neutro Abs: 2.2 10*3/uL (ref 1.7–7.7)
Neutrophils Relative %: 58 %
Platelets: 371 10*3/uL (ref 150–400)
RBC: 3.34 MIL/uL — ABNORMAL LOW (ref 4.22–5.81)
RDW: 18.9 % — ABNORMAL HIGH (ref 11.5–15.5)
WBC: 3.8 10*3/uL — ABNORMAL LOW (ref 4.0–10.5)
nRBC: 0 % (ref 0.0–0.2)

## 2020-10-10 LAB — MAGNESIUM: Magnesium: 1.8 mg/dL (ref 1.7–2.4)

## 2020-10-10 MED ORDER — ENSURE ENLIVE PO LIQD
237.0000 mL | Freq: Two times a day (BID) | ORAL | Status: DC
  Administered 2020-10-10: 237 mL via ORAL

## 2020-10-10 MED ORDER — HYDROMORPHONE HCL 1 MG/ML IJ SOLN
0.5000 mg | INTRAMUSCULAR | Status: DC | PRN
  Administered 2020-10-11: 0.5 mg via INTRAVENOUS
  Filled 2020-10-10: qty 0.5

## 2020-10-10 MED ORDER — BOOST / RESOURCE BREEZE PO LIQD CUSTOM: 1.0000 | ORAL | Status: DC

## 2020-10-10 MED ORDER — ADULT MULTIVITAMIN W/MINERALS CH
1.0000 | ORAL_TABLET | Freq: Every day | ORAL | Status: DC
  Administered 2020-10-10: 1 via ORAL
  Filled 2020-10-10: qty 1

## 2020-10-10 MED ORDER — METHOCARBAMOL 500 MG PO TABS
500.0000 mg | ORAL_TABLET | Freq: Four times a day (QID) | ORAL | Status: DC
  Administered 2020-10-10 (×2): 500 mg via ORAL
  Filled 2020-10-10 (×3): qty 1

## 2020-10-10 NOTE — Consult Note (Signed)
[place] Nurse ostomy follow up Patient receiving care in [place] Stoma type/location: LUQ colostomy Stomal assessment/size: pink, moist, productive Peristomal assessment: deferred. Pouch did not need changing. Treatment options for stomal/peristomal skin: barrier ring Output: thin brown liquid Ostomy pouching: 1pc. Flat, Kellie Simmering [phone] Education provided: none.  Patient too weak; patient will be discharged to a SNF. Enrolled patient in [place] Start Discharge program: Yes-previously.  Supplies in room.  WOC is signing off. Please reconsult if needed.  Val Riles, RN, MSN, CWOCN, CNS-BC, pager [phone]

## 2020-10-10 NOTE — Progress Notes (Signed)
Physical Therapy Treatment Patient Details Name: Marcus [name] MRN: [id number] DOB: November 06, 1945 Today's Date: 10/10/2020   History of Present Illness 75 yo male admitted with GI bleed, anemia. 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 Arnoldo Morale on 9/4,  R THA, S/P colostomy 09/30/20    PT Comments    Pt ambulated 90' with RW, distance limited by pain at colostomy site. Min assist for bed mobility.    Recommendations for follow up therapy are one component of a multi-disciplinary discharge planning process, led by the attending physician.  Recommendations may be updated based on patient status, additional functional criteria and insurance authorization.  Follow Up Recommendations  SNF     Equipment Recommendations  None recommended by PT    Recommendations for Other Services       Precautions / Restrictions Precautions Precautions: Fall;Back;Other (comment) Precaution Comments: now has colostomy, cannot wear brace, R JP; reviewed log roll technique Required Braces or Orthoses: Spinal Brace Restrictions Weight Bearing Restrictions: No     Mobility  Bed Mobility Overal bed mobility: Needs Assistance   Rolling: Supervision Sidelying to sit: Min assist;HOB elevated       General bed mobility comments: log roll to R, used rail, min A to raise trunk    Transfers Overall transfer level: Needs assistance Equipment used: Rolling walker (2 wheeled) Transfers: Sit to/from Stand Sit to Stand: Supervision            Ambulation/Gait Ambulation/Gait assistance: Supervision Gait Distance (Feet): 90 Feet Assistive device: Rolling walker (2 wheeled) Gait Pattern/deviations: Step-through pattern;Trunk flexed Gait velocity: Decreased   General Gait Details: distance limited by pain, VCs posture   Stairs             Wheelchair Mobility    Modified Rankin  (Stroke Patients Only)       Balance Overall balance assessment: Needs assistance Sitting-balance support: Feet supported Sitting balance-Leahy Scale: Fair     Standing balance support: No upper extremity supported Standing balance-Leahy Scale: Fair Standing balance comment: standing at sink during ADLs                            Cognition Arousal/Alertness: Awake/alert Behavior During Therapy: WFL for tasks assessed/performed Overall Cognitive Status: Within Functional Limits for tasks assessed                                        Exercises      General Comments        Pertinent Vitals/Pain Pain Score: 9  Pain Location: colostomy site and back Pain Descriptors / Indicators: Moaning Pain Intervention(s): Limited activity within patient's tolerance;Monitored during session (pt declined pain medication)    Home Living                      Prior Function            PT Goals (current goals can now be found in the care plan section) Acute Rehab PT Goals Patient Stated Goal: less pain, regain IND PT Goal Formulation: With patient Time For Goal Achievement: 10/24/20 Potential to Achieve Goals: Good Progress towards PT goals: Progressing toward goals    Frequency    Min 2X/week      PT Plan  Current plan remains appropriate    Co-evaluation              AM-PAC PT "6 Clicks" Mobility   Outcome Measure  Help needed turning from your back to your side while in a flat bed without using bedrails?: A Little Help needed moving from lying on your back to sitting on the side of a flat bed without using bedrails?: A Little Help needed moving to and from a bed to a chair (including a wheelchair)?: A Little Help needed standing up from a chair using your arms (e.g., wheelchair or bedside chair)?: A Little Help needed to walk in hospital room?: A Little Help needed climbing 3-5 steps with a railing? : A Little 6 Click Score:  18    End of Session Equipment Utilized During Treatment: Gait belt Activity Tolerance: Patient tolerated treatment well;Patient limited by pain Patient left: in chair;with call bell/phone within reach;with nursing/sitter in room Nurse Communication: Mobility status PT Visit Diagnosis: Unsteadiness on feet (R26.81);Muscle weakness (generalized) (M62.81);Difficulty in walking, not elsewhere classified (R26.2);Pain     Time: 8335-8251 PT Time Calculation (min) (ACUTE ONLY): 11 min  Charges:  $Gait Training: 8-22 mins                     Blondell Reveal Kistler PT 10/10/2020  Acute Rehabilitation Services Pager [phone] Office [phone] '

## 2020-10-10 NOTE — Progress Notes (Signed)
10 Days Post-Op  Subjective: No nausea with g-tube clamped and eating his clear liquids.  Ostomy working well now.  ROS: See above, otherwise other systems negative  Objective: Vital signs in last 24 hours: Temp:  [98.5 F (36.9 C)-98.6 F (37 C)] 98.5 F (36.9 C) (09/26 0455) Pulse Rate:  [71-76] 76 (09/26 0455) Resp:  [16-20] 16 (09/26 0455) BP: (142-154)/(70-80) 142/75 (09/26 0455) SpO2:  [96 %-98 %] 96 % (09/26 0455) Last BM Date: 10/09/20 (Colostomy)  Intake/Output from previous day: 09/25 0701 - 09/26 0700 In: 2595 [I.V.:2495; IV Piggyback:100] Out: 700 [Urine:575; Drains:125] Intake/Output this shift: No intake/output data recorded.  PE:  Abd: soft, appropriately tender, nondistended, g-tube clamped..  JP serous fluid.  Midline incision with staples.  Colostomy with viable and pink stoma, with feculent liquid output and gas present  Lab Results:  Recent Labs    10/09/20 0508 10/10/20 0436  WBC 3.8* 3.8*  HGB 8.4* 8.2*  HCT 26.6* 25.8*  PLT 364 371   BMET Recent Labs    10/09/20 0508 10/10/20 0436  NA 135 136  K 3.8 3.7  CL 107 107  CO2 20* 23  GLUCOSE 78 84  BUN 8 5*  CREATININE 0.86 0.93  CALCIUM 8.2* 8.2*   PT/INR No results for input(s): LABPROT, INR in the last 72 hours. CMP     Component Value Date/Time   NA 136 10/10/2020 0436   K 3.7 10/10/2020 0436   CL 107 10/10/2020 0436   CO2 23 10/10/2020 0436   GLUCOSE 84 10/10/2020 0436   BUN 5 (L) 10/10/2020 0436   CREATININE 0.93 10/10/2020 0436   CREATININE 1.01 09/15/2020 1415   CALCIUM 8.2 (L) 10/10/2020 0436   PROT 5.5 (L) 10/10/2020 0436   ALBUMIN 2.4 (L) 10/10/2020 0436   AST 13 (L) 10/10/2020 0436   AST 13 (L) 09/15/2020 1415   ALT 22 10/10/2020 0436   ALT 11 09/15/2020 1415   ALKPHOS 129 (H) 10/10/2020 0436   BILITOT 0.8 10/10/2020 0436   BILITOT 0.7 09/15/2020 1415   GFRNONAA >60 10/10/2020 0436   GFRNONAA >60 09/15/2020 1415   GFRAA >60 09/17/2018 0607   Lipase      Component Value Date/Time   LIPASE 36 09/14/2018 1838       Studies/Results: No results found.  Anti-infectives: Anti-infectives (From admission, onward)    Start     Dose/Rate Route Frequency Ordered Stop   09/30/20 0900  cefoTEtan (CEFOTAN) 2 g in sodium chloride 0.9 % 100 mL IVPB        2 g 200 mL/hr over 30 Minutes Intravenous On call to O.R. 09/30/20 0800 09/30/20 1421        Assessment/Plan POD 10, s/p lap assisted Hartmann's with g-tube placement by Dr. Redmond Pulling 09/30/20 for Obstructing sigmoid colon mass - g-tube clamped.  Tolerating CLD -adv to FLD -WOC following for new colostomy -JP with serous fluid, DC today  -needs to mobilize, PT/OT eval, recommending SNF, likely mid week if he continues to do well -onc would like a PAC, will discuss with MD regarding timing of this.  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 Arnoldo Morale   LOS: 13 days    Henreitta Cea , [facility] Surgery 10/10/2020, 9:38 AM Please see Amion for pager number during day hours 7:00am-4:30pm or 7:00am -11:30am on weekends

## 2020-10-10 NOTE — Progress Notes (Addendum)
Nutrition Follow-up  INTERVENTION:   -Boost Breeze po daily, each supplement provides 250 kcal and 9 grams of protein  -Ensure Plus PO BID, each provides 350 kcals and 13g protein  -Multivitamin with minerals daily  -Needs updated weight for admission (last recorded 9/14)  TF recommendations, if needed for nutrition support: -Osmolite 1.5 @ 20 ml/hr via PEG, advance by 10 ml every 12 hours to goal of 55 ml/hr. Providing 1980 kcals, 82g protein and 1005 ml H2O  NUTRITION DIAGNOSIS:   Increased nutrient needs related to acute illness as evidenced by estimated needs.  Ongoing.  GOAL:   Patient will meet greater than or equal to 90% of their needs  Progressing.  MONITOR:   Diet advancement, Labs, Weight trends, I & O's  ASSESSMENT:   75 y.o. 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 Arnoldo Morale on 9/4 and anemia returning with upper back pain, decreased appetite and black stool.  9/4: s/p laminectomy for thoracic tumor  9/11: admitted 9/12: s/p EGD 9/14: s/p flex sigmoidoscopy 9/16: s/p lap assisted Hartman's procedure with end colostomy, PEG tube placement 9/17: FLD 9/18: NPO 9/21: CLD 9/22: FLD 9/23: NPO 9/25: CLD  Patient now on full liquids today. Boost Breeze supplements are resumed and pt is accepting. Will order Ensure supplements as well. Will need to consider enteral feedings if pt is unable to keep diet advanced. TF recommendations above.  Admission weight: 155 lbs. Last weight 9/14: 164 lbs  Medications: Vitamin B-12  Labs reviewed.  Diet Order:   Diet Order             Diet full liquid Room service appropriate? Yes; Fluid consistency: Thin  Diet effective now                   EDUCATION NEEDS:   No education needs have been identified at this time  Skin:   Skin Assessment: Skin Integrity Issues: Skin Integrity Issues:: Incisions Incisions: 9/11 vertebral column, 9/16 abdomen  Last BM:  9/25 -type 7  Height:   Ht Readings from Last 1 Encounters:  09/28/20 5\' 10"  (1.778 m)    Weight:   Wt Readings from Last 1 Encounters:  09/28/20 74.8 kg    BMI:  Body mass index is 23.66 kg/m.  Estimated Nutritional Needs:   Kcal:  1800-2000  Protein:  85-100g  Fluid:  2L/day  Clayton Bibles, MS, RD, LDN Inpatient Clinical Dietitian Contact information available via Amion

## 2020-10-10 NOTE — H&P (View-Only) (Signed)
  10 Days Post-Op  Subjective: No nausea with g-tube clamped and eating his clear liquids.  Ostomy working well now.  ROS: See above, otherwise other systems negative  Objective: Vital signs in last 24 hours: Temp:  [98.5 F (36.9 C)-98.6 F (37 C)] 98.5 F (36.9 C) (09/26 0455) Pulse Rate:  [71-76] 76 (09/26 0455) Resp:  [16-20] 16 (09/26 0455) BP: (142-154)/(70-80) 142/75 (09/26 0455) SpO2:  [96 %-98 %] 96 % (09/26 0455) Last BM Date: 10/09/20 (Colostomy)  Intake/Output from previous day: 09/25 0701 - 09/26 0700 In: 2595 [I.V.:2495; IV Piggyback:100] Out: 700 [Urine:575; Drains:125] Intake/Output this shift: No intake/output data recorded.  PE:  Abd: soft, appropriately tender, nondistended, g-tube clamped..  JP serous fluid.  Midline incision with staples.  Colostomy with viable and pink stoma, with feculent liquid output and gas present  Lab Results:  Recent Labs    10/09/20 0508 10/10/20 0436  WBC 3.8* 3.8*  HGB 8.4* 8.2*  HCT 26.6* 25.8*  PLT 364 371   BMET Recent Labs    10/09/20 0508 10/10/20 0436  NA 135 136  K 3.8 3.7  CL 107 107  CO2 20* 23  GLUCOSE 78 84  BUN 8 5*  CREATININE 0.86 0.93  CALCIUM 8.2* 8.2*   PT/INR No results for input(s): LABPROT, INR in the last 72 hours. CMP     Component Value Date/Time   NA 136 10/10/2020 0436   K 3.7 10/10/2020 0436   CL 107 10/10/2020 0436   CO2 23 10/10/2020 0436   GLUCOSE 84 10/10/2020 0436   BUN 5 (L) 10/10/2020 0436   CREATININE 0.93 10/10/2020 0436   CREATININE 1.01 09/15/2020 1415   CALCIUM 8.2 (L) 10/10/2020 0436   PROT 5.5 (L) 10/10/2020 0436   ALBUMIN 2.4 (L) 10/10/2020 0436   AST 13 (L) 10/10/2020 0436   AST 13 (L) 09/15/2020 1415   ALT 22 10/10/2020 0436   ALT 11 09/15/2020 1415   ALKPHOS 129 (H) 10/10/2020 0436   BILITOT 0.8 10/10/2020 0436   BILITOT 0.7 09/15/2020 1415   GFRNONAA >60 10/10/2020 0436   GFRNONAA >60 09/15/2020 1415   GFRAA >60 09/17/2018 0607   Lipase      Component Value Date/Time   LIPASE 36 09/14/2018 1838       Studies/Results: No results found.  Anti-infectives: Anti-infectives (From admission, onward)    Start     Dose/Rate Route Frequency Ordered Stop   09/30/20 0900  cefoTEtan (CEFOTAN) 2 g in sodium chloride 0.9 % 100 mL IVPB        2 g 200 mL/hr over 30 Minutes Intravenous On call to O.R. 09/30/20 0800 09/30/20 1421        Assessment/Plan POD 10, s/p lap assisted Hartmann's with g-tube placement by Dr. Wilson 09/30/20 for Obstructing sigmoid colon mass - g-tube clamped.  Tolerating CLD -adv to FLD -WOC following for new colostomy -JP with serous fluid, DC today  -needs to mobilize, PT/OT eval, recommending SNF, likely mid week if he continues to do well -onc would like a PAC, will discuss with MD regarding timing of this.  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 Jenkins   LOS: 13 days    Raniyah Curenton E Teja Judice , PA-C [facility] 10/10/2020, 9:38 AM Please see Amion for pager number during day hours 7:00am-4:30pm or 7:00am -11:30am on weekends  

## 2020-10-10 NOTE — Progress Notes (Signed)
Patient ID: Marcus [name], male   DOB: 1945-02-01, 75 y.o.   MRN: [id number]  PROGRESS NOTE    JYQUAN KENLEY  WPY:[id number] DOB: 29-Jul-1945 DOA: 09/25/2020 PCP: [name], MD   Brief Narrative:  75 y.o. 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 Arnoldo Morale on 09/18/20 and anemia presented with upper back pain, decreased appetite and black stool.  EGD showed esophageal mass; biopsy consistent with poorly differentiated carcinoma.  CT of the abdomen showed possible mass; colonoscopy showed sigmoid obstructing mass.  He underwent Hartman's procedure with end colostomy on 09/30/2020.  Palliative care, oncology and radiation oncology were consulted.  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  X-ray of abdomen and CT of abdomen and pelvis on 10/07/2020 showed postoperative adynamic ileus with interval development of intra and extrahepatic biliary dilatation  -Diet advancement as per general surgery. -wound care as per general surgery recommendations. -Continue IV fluids.  New diagnosis of poorly differentiated esophageal carcinoma -Status post EGD on 09/26/2020. -Oncology and radiation oncology following.  Outpatient follow-up with radiation oncology. -Patient will need port placement prior to discharge as per oncology.  General surgery will contemplate placing that once patient is more stable and closer to discharge. -Palliative care had evaluated the patient during this hospitalization.  Patient remains full code at this time.  Palliative care following intermittently.  Intra and extrahepatic biliary dilatation -As seen on CT  of the abdomen on 10/07/2020.  Unclear if this is from hepatic metastasis.  LFTs not elevated.  Will monitor intermittently   Metastatic prostate cancer Postsurgical back pain -S/p radical prostatectomy and radiation in 2015 -Thoracic spine tumor with cord compression status post T5-6 laminectomy with posterior lateral arthrodesis from T4-T8 by Dr. Arnoldo Morale on 09/18/2020 -Radiation oncology is recommending urology consult for androgen deprivation therapy.  Communicated with Dr. Macdiarmid/urology on 10/05/2020 who saw the patient on 10/05/2020 and recommended outpatient follow-up with Dr. Tresa Moore. -Outpatient follow-up with neurosurgery  Pyuria/bacteriuria -Currently being monitored off antibiotics  Leukocytosis -Resolved  Anemia of chronic disease -From cancer.  Hemoglobin stable  Hyponatremia -Improved. IV fluids plan as above.  Hyperkalemia -Resolved.  Generalized deconditioning -Will eventually need SNF placement  DVT prophylaxis: Lovenox Code Status: Full Family Communication: None at bedside Disposition Plan: Status is: Inpatient  Remains inpatient appropriate because:Inpatient level of care appropriate due to severity of illness  Dispo: The patient is from: Home              Anticipated d/c is to: SNF              Patient currently is not medically stable to d/c.   Difficult to place patient No  Consultants: GI/general surgery/oncology/radiation oncology/palliative care  Procedures: EGD/colonoscopy Hartman's procedure with end colostomy and G-tube placement on 09/30/2020  Antimicrobials:  Anti-infectives (From admission, onward)    Start     Dose/Rate Route Frequency Ordered Stop   09/30/20 0900  cefoTEtan (CEFOTAN) 2 g in sodium chloride 0.9 % 100 mL IVPB        2 g 200 mL/hr over 30 Minutes Intravenous On call to O.R. 09/30/20 0800 09/30/20 1421        Subjective: Patient seen and examined  at bedside.  No worsening shortness of breath, fever reported.  Still  complains of intermittent nausea.   Objective: Vitals:   10/09/20 0523 10/09/20 1345 10/09/20 2104 10/10/20 0455  BP: (!) 153/80 (!) 154/80 (!) 143/70 (!) 142/75  Pulse: 80 71 73 76  Resp: 16 19 20 16   Temp: 98.2 F (36.8 C) 98.5 F (36.9 C) 98.6 F (37 C) 98.5 F (36.9 C)  TempSrc: Oral Oral Oral Oral  SpO2: 97% 96% 98% 96%  Weight:      Height:        Intake/Output Summary (Last 24 hours) at 10/10/2020 0816 Last data filed at 10/10/2020 0500 Gross per 24 hour  Intake 2595 ml  Output 700 ml  Net 1895 ml    Filed Weights   09/25/20 0617 09/26/20 1245 09/28/20 0800  Weight: 70.7 kg 74.8 kg 74.8 kg    Examination:  General exam: On room air currently.  No acute distress.  Looks chronically ill and deconditioned.   Respiratory system: Decreased breath sounds at bases bilaterally with some crackles.   Cardiovascular system: Rate controlled, S1-S2 heard gastrointestinal system: Abdomen is mildly distended, soft and mildly tender.  G-tube and colostomy bag present.  Sluggish bowel sounds  extremities: No edema or clubbing Central nervous system: Still very slow to respond; alert.  Poor historian.  No focal neurological deficits.  Moves extremities Skin: No obvious ecchymosis/lesions  psychiatry: Flat affect  Data Reviewed: I have personally reviewed following labs and imaging studies  CBC: Recent Labs  Lab 10/06/20 0458 10/07/20 0447 10/08/20 0515 10/09/20 0508 10/10/20 0436  WBC 4.3 4.1 4.0 3.8* 3.8*  NEUTROABS  --   --   --  2.0 2.2  HGB 8.5* 9.2* 9.2* 8.4* 8.2*  HCT 26.3* 28.5* 28.7* 26.6* 25.8*  MCV 78.5* 77.7* 77.4* 77.3* 77.2*  PLT 310 362 376 364 250    Basic Metabolic Panel: Recent Labs  Lab 10/06/20 0458 10/07/20 0447 10/07/20 0730 10/08/20 0515 10/09/20 0508 10/10/20 0436  NA 133* 126* 132* 134* 135 136  K 4.3 5.8* 4.5 4.0 3.8 3.7  CL 105 94* 102 102 107 107  CO2 23 24 22 23  20* 23  GLUCOSE 118* 117* 103* 107* 78 84  BUN 5* <5* <5* 7* 8 5*   CREATININE 0.77 1.00 0.86 0.95 0.86 0.93  CALCIUM 8.5* 8.4* 8.7* 8.7* 8.2* 8.2*  MG 2.2 2.0  --  2.0 2.0 1.8    GFR: Estimated Creatinine Clearance: 74.1 mL/min (by C-G formula based on SCr of 0.93 mg/dL). Liver Function Tests: Recent Labs  Lab 10/08/20 0515 10/09/20 0508 10/10/20 0436  AST 20 14* 13*  ALT 46* 29 22  ALKPHOS 201* 153* 129*  BILITOT 0.6 1.0 0.8  PROT 6.3* 5.7* 5.5*  ALBUMIN 2.6* 2.5* 2.4*    No results for input(s): LIPASE, AMYLASE in the last 168 hours. No results for input(s): AMMONIA in the last 168 hours. Coagulation Profile: No results for input(s): INR, PROTIME in the last 168 hours. Cardiac Enzymes: No results for input(s): CKTOTAL, CKMB, CKMBINDEX, TROPONINI in the last 168 hours. BNP (last 3 results) No results for input(s): PROBNP in the last 8760 hours. HbA1C: No results for input(s): HGBA1C in the last 72 hours. CBG: No results for input(s): GLUCAP in the last 168 hours.  Lipid Profile: No results for input(s): CHOL, HDL, LDLCALC, TRIG, CHOLHDL, LDLDIRECT in the last 72 hours. Thyroid Function Tests: No results for input(s): TSH, T4TOTAL, FREET4, T3FREE, THYROIDAB in the last  72 hours. Anemia Panel: No results for input(s): VITAMINB12, FOLATE, FERRITIN, TIBC, IRON, RETICCTPCT in the last 72 hours. Sepsis Labs: No results for input(s): PROCALCITON, LATICACIDVEN in the last 168 hours.  No results found for this or any previous visit (from the past 240 hour(s)).       Radiology Studies: No results found.      Scheduled Meds:  acetaminophen  1,000 mg Oral Q6H   Chlorhexidine Gluconate Cloth  6 each Topical Daily   enoxaparin (LOVENOX) injection  40 mg Subcutaneous Q24H   feeding supplement  1 Container Oral TID BM   pantoprazole  40 mg Oral BID   vitamin B-12  500 mcg Oral Daily   Continuous Infusions:  sodium chloride 75 mL/hr at 10/10/20 0535   methocarbamol (ROBAXIN) IV 500 mg (10/10/20 0538)          Aline August, MD [facility] 10/10/2020, 8:16 AM

## 2020-10-11 ENCOUNTER — Encounter (HOSPITAL_COMMUNITY): Payer: Self-pay | Admitting: Internal Medicine

## 2020-10-11 ENCOUNTER — Inpatient Hospital Stay (HOSPITAL_COMMUNITY): Payer: Medicare Other | Admitting: Certified Registered Nurse Anesthetist

## 2020-10-11 ENCOUNTER — Inpatient Hospital Stay (HOSPITAL_COMMUNITY): Payer: Medicare Other

## 2020-10-11 ENCOUNTER — Ambulatory Visit: Payer: Medicare Other | Admitting: Radiation Oncology

## 2020-10-11 ENCOUNTER — Encounter (HOSPITAL_COMMUNITY)
Admission: EM | Disposition: A | Discharge status: Skilled Nursing Facility | Payer: Self-pay | Source: Home / Self Care | Attending: Hospitalist

## 2020-10-11 DIAGNOSIS — E871 Hypo-osmolality and hyponatremia: Secondary | ICD-10-CM | POA: Diagnosis not present

## 2020-10-11 DIAGNOSIS — C187 Malignant neoplasm of sigmoid colon: Secondary | ICD-10-CM | POA: Diagnosis not present

## 2020-10-11 DIAGNOSIS — C154 Malignant neoplasm of middle third of esophagus: Secondary | ICD-10-CM | POA: Diagnosis not present

## 2020-10-11 HISTORY — PX: PORTACATH PLACEMENT: SHX2246

## 2020-10-11 LAB — CBC
HCT: 29.6 % — ABNORMAL LOW (ref 39.0–52.0)
Hemoglobin: 9.3 g/dL — ABNORMAL LOW (ref 13.0–17.0)
MCH: 24.8 pg — ABNORMAL LOW (ref 26.0–34.0)
MCHC: 31.4 g/dL (ref 30.0–36.0)
MCV: 78.9 fL — ABNORMAL LOW (ref 80.0–100.0)
Platelets: 439 10*3/uL — ABNORMAL HIGH (ref 150–400)
RBC: 3.75 MIL/uL — ABNORMAL LOW (ref 4.22–5.81)
RDW: 19.5 % — ABNORMAL HIGH (ref 11.5–15.5)
WBC: 6.2 10*3/uL (ref 4.0–10.5)
nRBC: 0 % (ref 0.0–0.2)

## 2020-10-11 LAB — CREATININE, SERUM
Creatinine, Ser: 0.7 mg/dL (ref 0.61–1.24)
GFR, Estimated: >60 mL/min (ref >60)

## 2020-10-11 SURGERY — INSERTION, TUNNELED CENTRAL VENOUS DEVICE, WITH PORT
Anesthesia: General | Site: Chest

## 2020-10-11 MED ORDER — PROMETHAZINE HCL 25 MG/ML IJ SOLN: 6.2500 mg | INTRAMUSCULAR | Status: DC | PRN

## 2020-10-11 MED ORDER — TRAMADOL HCL 50 MG PO TABS
50.0000 mg | ORAL_TABLET | Freq: Four times a day (QID) | ORAL | Status: DC | PRN
  Administered 2020-10-11: 50 mg via ORAL
  Filled 2020-10-11: qty 1

## 2020-10-11 MED ORDER — OXYCODONE HCL 5 MG PO TABS: 5.0000 mg | ORAL_TABLET | Freq: Once | ORAL | Status: DC | PRN

## 2020-10-11 MED ORDER — FENTANYL CITRATE (PF) 100 MCG/2ML IJ SOLN
INTRAMUSCULAR | Status: DC | PRN
  Administered 2020-10-11: 25 ug via INTRAVENOUS
  Administered 2020-10-11: 50 ug via INTRAVENOUS
  Administered 2020-10-11: 25 ug via INTRAVENOUS

## 2020-10-11 MED ORDER — DEXAMETHASONE SODIUM PHOSPHATE 10 MG/ML IJ SOLN
INTRAMUSCULAR | Status: AC
  Filled 2020-10-11: qty 1

## 2020-10-11 MED ORDER — EPHEDRINE SULFATE-NACL 50-0.9 MG/10ML-% IV SOSY
PREFILLED_SYRINGE | INTRAVENOUS | Status: DC | PRN
  Administered 2020-10-11: 5 mg via INTRAVENOUS

## 2020-10-11 MED ORDER — BUPIVACAINE-EPINEPHRINE (PF) 0.25% -1:200000 IJ SOLN
INTRAMUSCULAR | Status: AC
  Filled 2020-10-11: qty 30

## 2020-10-11 MED ORDER — ACETAMINOPHEN 325 MG PO TABS
650.0000 mg | ORAL_TABLET | Freq: Four times a day (QID) | ORAL | Status: DC | PRN
  Administered 2020-10-11: 650 mg via ORAL

## 2020-10-11 MED ORDER — BUPIVACAINE-EPINEPHRINE 0.25% -1:200000 IJ SOLN
INTRAMUSCULAR | Status: DC | PRN
  Administered 2020-10-11: 10 mL

## 2020-10-11 MED ORDER — PROPOFOL 10 MG/ML IV BOLUS
INTRAVENOUS | Status: DC | PRN
  Administered 2020-10-11: 150 mg via INTRAVENOUS

## 2020-10-11 MED ORDER — SODIUM CHLORIDE 0.45 % IV SOLN: INTRAVENOUS | Status: DC

## 2020-10-11 MED ORDER — FENTANYL CITRATE (PF) 100 MCG/2ML IJ SOLN
INTRAMUSCULAR | Status: AC
  Filled 2020-10-11: qty 2

## 2020-10-11 MED ORDER — CHLORHEXIDINE GLUCONATE CLOTH 2 % EX PADS: 6.0000 | MEDICATED_PAD | Freq: Once | CUTANEOUS | Status: DC

## 2020-10-11 MED ORDER — PROPOFOL 10 MG/ML IV BOLUS
INTRAVENOUS | Status: AC
  Filled 2020-10-11: qty 20

## 2020-10-11 MED ORDER — ONDANSETRON 4 MG PO TBDP: 4.0000 mg | ORAL_TABLET | Freq: Four times a day (QID) | ORAL | Status: DC | PRN

## 2020-10-11 MED ORDER — DEXAMETHASONE SODIUM PHOSPHATE 10 MG/ML IJ SOLN
INTRAMUSCULAR | Status: DC | PRN
  Administered 2020-10-11: 7 mg via INTRAVENOUS

## 2020-10-11 MED ORDER — FENTANYL CITRATE PF 50 MCG/ML IJ SOSY: 25.0000 ug | PREFILLED_SYRINGE | INTRAMUSCULAR | Status: DC | PRN

## 2020-10-11 MED ORDER — ACETAMINOPHEN 650 MG RE SUPP: 650.0000 mg | Freq: Four times a day (QID) | RECTAL | Status: DC | PRN

## 2020-10-11 MED ORDER — ONDANSETRON HCL 4 MG/2ML IJ SOLN
INTRAMUSCULAR | Status: AC
  Filled 2020-10-11: qty 2

## 2020-10-11 MED ORDER — LIDOCAINE 2% (20 MG/ML) 5 ML SYRINGE
INTRAMUSCULAR | Status: DC | PRN
  Administered 2020-10-11: 20 mg via INTRAVENOUS

## 2020-10-11 MED ORDER — ONDANSETRON HCL 4 MG/2ML IJ SOLN
INTRAMUSCULAR | Status: DC | PRN
  Administered 2020-10-11: 4 mg via INTRAVENOUS

## 2020-10-11 MED ORDER — MEPERIDINE HCL 50 MG/ML IJ SOLN: 6.2500 mg | INTRAMUSCULAR | Status: DC | PRN

## 2020-10-11 MED ORDER — PHENYLEPHRINE 40 MCG/ML (10ML) SYRINGE FOR IV PUSH (FOR BLOOD PRESSURE SUPPORT)
PREFILLED_SYRINGE | INTRAVENOUS | Status: DC | PRN
  Administered 2020-10-11: 80 ug via INTRAVENOUS
  Administered 2020-10-11: 120 ug via INTRAVENOUS
  Administered 2020-10-11: 80 ug via INTRAVENOUS

## 2020-10-11 MED ORDER — OXYCODONE HCL 5 MG/5ML PO SOLN
5.0000 mg | Freq: Once | ORAL | Status: DC | PRN
Start: 2020-10-11 — End: 2020-10-11

## 2020-10-11 MED ORDER — HEPARIN 6000 UNIT IRRIGATION SOLUTION
Freq: Once | Status: AC
  Administered 2020-10-11: 1
  Filled 2020-10-11: qty 6000

## 2020-10-11 MED ORDER — CEFAZOLIN SODIUM-DEXTROSE 2-4 GM/100ML-% IV SOLN
2.0000 g | INTRAVENOUS | Status: AC
  Administered 2020-10-11: 2 g via INTRAVENOUS
  Filled 2020-10-11 (×2): qty 100

## 2020-10-11 MED ORDER — ONDANSETRON HCL 4 MG/2ML IJ SOLN
4.0000 mg | Freq: Four times a day (QID) | INTRAMUSCULAR | Status: DC | PRN
  Administered 2020-10-18 – 2020-10-22 (×2): 4 mg via INTRAVENOUS
  Filled 2020-10-11 (×2): qty 2

## 2020-10-11 MED ORDER — LACTATED RINGERS IV SOLN: INTRAVENOUS | Status: DC

## 2020-10-11 MED ORDER — ENOXAPARIN SODIUM 40 MG/0.4ML IJ SOSY
40.0000 mg | PREFILLED_SYRINGE | INTRAMUSCULAR | Status: DC
  Administered 2020-10-12 – 2020-10-28 (×17): 40 mg via SUBCUTANEOUS
  Filled 2020-10-11 (×17): qty 0.4

## 2020-10-11 MED ORDER — CHLORHEXIDINE GLUCONATE 0.12 % MT SOLN
15.0000 mL | Freq: Once | OROMUCOSAL | Status: AC
  Administered 2020-10-11: 15 mL via OROMUCOSAL

## 2020-10-11 MED ORDER — HEPARIN SOD (PORK) LOCK FLUSH 100 UNIT/ML IV SOLN
INTRAVENOUS | Status: AC
  Filled 2020-10-11: qty 5

## 2020-10-11 MED ORDER — HEPARIN SOD (PORK) LOCK FLUSH 100 UNIT/ML IV SOLN
INTRAVENOUS | Status: DC | PRN
  Administered 2020-10-11: 500 [IU]

## 2020-10-11 MED ORDER — MIDAZOLAM HCL 2 MG/2ML IJ SOLN: 0.5000 mg | Freq: Once | INTRAMUSCULAR | Status: DC | PRN

## 2020-10-11 SURGICAL SUPPLY — 38 items
ADH SKN CLS APL DERMABOND .7 (GAUZE/BANDAGES/DRESSINGS) ×1
APL PRP STRL LF DISP 70% ISPRP (MISCELLANEOUS) ×1
BAG COUNTER SPONGE SURGICOUNT (BAG) IMPLANT
BAG DECANTER FOR FLEXI CONT (MISCELLANEOUS) ×2 IMPLANT
BAG SPNG CNTER NS LX DISP (BAG)
BLADE SURG 15 STRL LF DISP TIS (BLADE) ×1 IMPLANT
BLADE SURG 15 STRL SS (BLADE) ×2
CHLORAPREP W/TINT 26 (MISCELLANEOUS) ×3 IMPLANT
COVER SURGICAL LIGHT HANDLE (MISCELLANEOUS) ×2 IMPLANT
DECANTER SPIKE VIAL GLASS SM (MISCELLANEOUS) ×2 IMPLANT
DERMABOND ADVANCED (GAUZE/BANDAGES/DRESSINGS) ×1
DERMABOND ADVANCED .7 DNX12 (GAUZE/BANDAGES/DRESSINGS) IMPLANT
DRAPE C-ARM 42X120 X-RAY (DRAPES) ×2 IMPLANT
DRAPE LAPAROTOMY TRNSV 102X78 (DRAPES) ×2 IMPLANT
ELECT REM PT RETURN 15FT ADLT (MISCELLANEOUS) ×2 IMPLANT
GAUZE 4X4 16PLY ~~LOC~~+RFID DBL (SPONGE) ×2 IMPLANT
GAUZE SPONGE 4X4 12PLY STRL (GAUZE/BANDAGES/DRESSINGS) ×2 IMPLANT
GLOVE SURG ORTHO LTX SZ8 (GLOVE) ×2 IMPLANT
GLOVE SURG SYN 7.5  E (GLOVE) ×2
GLOVE SURG SYN 7.5 E (GLOVE) ×1 IMPLANT
GLOVE SURG SYN 7.5 PF PI (GLOVE) ×1 IMPLANT
GOWN STRL REUS W/TWL XL LVL3 (GOWN DISPOSABLE) ×4 IMPLANT
KIT BASIN OR (CUSTOM PROCEDURE TRAY) ×2 IMPLANT
KIT PORT POWER 8FR ISP CVUE (Port) ×1 IMPLANT
KIT TURNOVER KIT A (KITS) ×2 IMPLANT
NDL HYPO 25X1 1.5 SAFETY (NEEDLE) ×1 IMPLANT
NEEDLE HYPO 25X1 1.5 SAFETY (NEEDLE) ×2 IMPLANT
PACK BASIC VI WITH GOWN DISP (CUSTOM PROCEDURE TRAY) ×2 IMPLANT
PENCIL BUTTON HOLSTER BLD 10FT (ELECTRODE) ×1 IMPLANT
PENCIL SMOKE EVACUATOR (MISCELLANEOUS) IMPLANT
STRIP CLOSURE SKIN 1/2X4 (GAUZE/BANDAGES/DRESSINGS) ×2 IMPLANT
SUT MNCRL AB 4-0 PS2 18 (SUTURE) ×2 IMPLANT
SUT PROLENE 2 0 CT2 30 (SUTURE) ×2 IMPLANT
SUT VIC AB 3-0 SH 27 (SUTURE) ×2
SUT VIC AB 3-0 SH 27XBRD (SUTURE) ×1 IMPLANT
SYR CONTROL 10ML LL (SYRINGE) ×2 IMPLANT
TOWEL OR 17X26 10 PK STRL BLUE (TOWEL DISPOSABLE) ×2 IMPLANT
TOWEL OR NON WOVEN STRL DISP B (DISPOSABLE) ×2 IMPLANT

## 2020-10-11 NOTE — TOC Progression Note (Signed)
Transition of Care [facility]) - Progression Note    Patient Details  Name: Marcus [name] MRN: [id number] Date of Birth: 1945/10/19  Transition of Care [facility]) CM/SW [place], [place] Phone Number: 10/11/2020, 8:51 AM  Clinical Narrative:   Patient with new colostomy, feeding tube, is recommended for SNF for rehab.  He has no current cancer treatment scheduled as they are waiting to see time frame for gaining strength to withstand more treatment.  He does have a port for future access. Bed search initiated. TOC will continue to follow during the course of hospitalization.     Expected Discharge Plan: Skilled Nursing Facility Barriers to Discharge: SNF Pending bed offer  Expected Discharge Plan and Services Expected Discharge Plan: [place]                                               Social Determinants of Health (SDOH) Interventions    Readmission Risk Interventions Readmission Risk Prevention Plan 09/30/2020  Transportation Screening Complete  PCP or Specialist Appt within 5-7 Days Complete  Home Care Screening Complete  Medication Review (RN CM) Complete  Some recent data might be hidden

## 2020-10-11 NOTE — Anesthesia Postprocedure Evaluation (Signed)
Anesthesia Post Note  Patient: Marcus [name]  Procedure(s) Performed: INSERTION PORT-A-CATH (Chest)     Patient location during evaluation: PACU Anesthesia Type: General Level of consciousness: awake and alert, patient cooperative and oriented Pain management: pain level controlled Vital Signs Assessment: post-procedure vital signs reviewed and stable Respiratory status: spontaneous breathing, nonlabored ventilation and respiratory function stable Cardiovascular status: blood pressure returned to baseline and stable Postop Assessment: no apparent nausea or vomiting Anesthetic complications: no   No notable events documented.  Last Vitals:  Vitals:   10/11/20 1400 10/11/20 1500  BP: (!) 154/72 (!) 155/82  Pulse: 73   Resp: 16   Temp:    SpO2: 93%     Last Pain:  Vitals:   10/11/20 1400  TempSrc:   PainSc: 0-No pain                 Monicka Cyran,E. Kana Reimann

## 2020-10-11 NOTE — Progress Notes (Signed)
Patient ID: Marcus [name], male   DOB: 1945/03/04, 75 y.o.   MRN: [id number]  PROGRESS NOTE    Marcus [name]  IZT:[id number] DOB: 01/31/1945 DOA: 09/25/2020 PCP: [name], MD   Brief Narrative:  75 y.o. 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 Arnoldo Morale on 09/18/20 and anemia presented with upper back pain, decreased appetite and black stool.  EGD showed esophageal mass; biopsy consistent with poorly differentiated carcinoma.  CT of the abdomen showed possible mass; colonoscopy showed sigmoid obstructing mass.  He underwent Hartman's procedure with end colostomy on 09/30/2020.  Palliative care, oncology and radiation oncology were consulted.  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  X-ray of abdomen and CT of abdomen and pelvis on 10/07/2020 showed postoperative adynamic ileus with interval development of intra and extrahepatic biliary dilatation  -Diet advancement as per general surgery. -wound care as per general surgery recommendations. -Continue IV fluids.  New diagnosis of poorly differentiated esophageal carcinoma -Status post EGD on 09/26/2020. -Oncology and radiation oncology following.  Outpatient follow-up with radiation oncology. -Patient will need port placement prior to discharge as per oncology.  General surgery planning for port placement today. -Palliative care had evaluated the patient during this hospitalization.  Patient remains full code at this time.  Palliative care following intermittently.  Intra and extrahepatic biliary dilatation -As seen on CT of the abdomen on 10/07/2020.  Unclear if this  is from hepatic metastasis.  LFTs not elevated.  Will monitor intermittently   Metastatic prostate cancer Postsurgical back pain -S/p radical prostatectomy and radiation in 2015 -Thoracic spine tumor with cord compression status post T5-6 laminectomy with posterior lateral arthrodesis from T4-T8 by Dr. Arnoldo Morale on 09/18/2020 -Radiation oncology is recommending urology consult for androgen deprivation therapy.  Communicated with Dr. Macdiarmid/urology on 10/05/2020 who saw the patient on 10/05/2020 and recommended outpatient follow-up with Dr. Tresa Moore. -Outpatient follow-up with neurosurgery  Pyuria/bacteriuria -Currently being monitored off antibiotics  Leukocytosis -Resolved  Anemia of chronic disease -From cancer.  Hemoglobin stable  Hyponatremia -Improved. IV fluids plan as above.  Hyperkalemia -Resolved.  Generalized deconditioning -Will eventually need SNF placement  DVT prophylaxis: Lovenox Code Status: Full Family Communication: None at bedside Disposition Plan: Status is: Inpatient  Remains inpatient appropriate because:Inpatient level of care appropriate due to severity of illness  Dispo: The patient is from: Home              Anticipated d/c is to: SNF in 2 to 3 days once cleared by general surgery.              Patient currently is not medically stable to d/c.   Difficult to place patient No  Consultants: GI/general surgery/oncology/radiation oncology/palliative care  Procedures: EGD/colonoscopy Hartman's procedure with end colostomy and G-tube placement on 09/30/2020  Antimicrobials:  Anti-infectives (From admission, onward)    Start     Dose/Rate Route Frequency Ordered Stop   10/11/20 0600  ceFAZolin (ANCEF) IVPB 2g/100 mL premix        2 g 200 mL/hr over 30 Minutes Intravenous On call to O.R. 10/11/20 0418 10/12/20 0559   09/30/20 0900  cefoTEtan (CEFOTAN) 2 g in sodium chloride 0.9 % 100 mL  IVPB        2 g 200 mL/hr over 30 Minutes Intravenous On call to  O.R. 09/30/20 0800 09/30/20 1421        Subjective: Patient seen and examined at bedside.  No fever, chest pain or worsening shortness of breath reported.  Still complains of intermittent nausea. Objective: Vitals:   10/10/20 1140 10/10/20 1440 10/10/20 1951 10/11/20 0344  BP:  (!) 150/62 135/71 (!) 147/61  Pulse:  77 74 73  Resp:   20 20  Temp:  (!) 97.4 F (36.3 C) 98.5 F (36.9 C) 98.4 F (36.9 C)  TempSrc:  Oral Oral Oral  SpO2:  99% 98% 99%  Weight: 67.2 kg     Height:        Intake/Output Summary (Last 24 hours) at 10/11/2020 0751 Last data filed at 10/11/2020 0010 Gross per 24 hour  Intake --  Output 220 ml  Net -220 ml    Filed Weights   09/26/20 1245 09/28/20 0800 10/10/20 1140  Weight: 74.8 kg 74.8 kg 67.2 kg    Examination:  General exam: No distress.  Currently on room air.  Looks chronically ill and deconditioned.  Slow to respond.  Flat affect.  Poor historian. Respiratory system: Bilateral decreased breath sounds at bases with scattered crackles  cardiovascular system: S1-S2 heard; rate controlled  gastrointestinal system: Abdomen is distended slightly, soft and mildly tender.  G-tube and colostomy bag present.  Bowel sounds are present.   Extremities: No cyanosis, clubbing or edema.    Data Reviewed: I have personally reviewed following labs and imaging studies  CBC: Recent Labs  Lab 10/06/20 0458 10/07/20 0447 10/08/20 0515 10/09/20 0508 10/10/20 0436  WBC 4.3 4.1 4.0 3.8* 3.8*  NEUTROABS  --   --   --  2.0 2.2  HGB 8.5* 9.2* 9.2* 8.4* 8.2*  HCT 26.3* 28.5* 28.7* 26.6* 25.8*  MCV 78.5* 77.7* 77.4* 77.3* 77.2*  PLT 310 362 376 364 170    Basic Metabolic Panel: Recent Labs  Lab 10/06/20 0458 10/07/20 0447 10/07/20 0730 10/08/20 0515 10/09/20 0508 10/10/20 0436  NA 133* 126* 132* 134* 135 136  K 4.3 5.8* 4.5 4.0 3.8 3.7  CL 105 94* 102 102 107 107  CO2 23 24 22 23  20* 23  GLUCOSE 118* 117* 103* 107* 78 84  BUN 5* <5* <5* 7* 8 5*   CREATININE 0.77 1.00 0.86 0.95 0.86 0.93  CALCIUM 8.5* 8.4* 8.7* 8.7* 8.2* 8.2*  MG 2.2 2.0  --  2.0 2.0 1.8    GFR: Estimated Creatinine Clearance: 68.2 mL/min (by C-G formula based on SCr of 0.93 mg/dL). Liver Function Tests: Recent Labs  Lab 10/08/20 0515 10/09/20 0508 10/10/20 0436  AST 20 14* 13*  ALT 46* 29 22  ALKPHOS 201* 153* 129*  BILITOT 0.6 1.0 0.8  PROT 6.3* 5.7* 5.5*  ALBUMIN 2.6* 2.5* 2.4*    No results for input(s): LIPASE, AMYLASE in the last 168 hours. No results for input(s): AMMONIA in the last 168 hours. Coagulation Profile: No results for input(s): INR, PROTIME in the last 168 hours. Cardiac Enzymes: No results for input(s): CKTOTAL, CKMB, CKMBINDEX, TROPONINI in the last 168 hours. BNP (last 3 results) No results for input(s): PROBNP in the last 8760 hours. HbA1C: No results for input(s): HGBA1C in the last 72 hours. CBG: No results for input(s): GLUCAP in the last 168 hours.  Lipid Profile: No results for input(s): CHOL, HDL, LDLCALC, TRIG, CHOLHDL, LDLDIRECT in the last  72 hours. Thyroid Function Tests: No results for input(s): TSH, T4TOTAL, FREET4, T3FREE, THYROIDAB in the last 72 hours. Anemia Panel: No results for input(s): VITAMINB12, FOLATE, FERRITIN, TIBC, IRON, RETICCTPCT in the last 72 hours. Sepsis Labs: No results for input(s): PROCALCITON, LATICACIDVEN in the last 168 hours.  No results found for this or any previous visit (from the past 240 hour(s)).       Radiology Studies: No results found.      Scheduled Meds:  acetaminophen  1,000 mg Oral Q6H   Chlorhexidine Gluconate Cloth  6 each Topical Daily   Chlorhexidine Gluconate Cloth  6 each Topical Once   feeding supplement  1 Container Oral Q24H   feeding supplement  237 mL Oral BID BM   methocarbamol  500 mg Oral QID   multivitamin with minerals  1 tablet Oral Daily   pantoprazole  40 mg Oral BID   vitamin B-12  500 mcg Oral Daily   Continuous Infusions:   sodium chloride 75 mL/hr at 10/10/20 1905    ceFAZolin (ANCEF) IV            Aline August, MD [facility] 10/11/2020, 7:51 AM

## 2020-10-11 NOTE — Anesthesia Preprocedure Evaluation (Addendum)
Anesthesia Evaluation  Patient identified by MRN, date of birth, ID band Patient awake    Reviewed: Allergy & Precautions, NPO status , Patient's Chart, lab work & pertinent test results  History of Anesthesia Complications Negative for: history of anesthetic complications  Airway Mallampati: I  TM Distance: >3 FB     Dental  (+) Edentulous Upper, Dental Advisory Given, Poor Dentition, Missing, Chipped   Pulmonary Current Smoker and Patient abstained from smoking.,    breath sounds clear to auscultation       Cardiovascular hypertension, (-) angina Rhythm:Regular Rate:Normal     Neuro/Psych Depression H/o thoracic spine tumor with cord compression    GI/Hepatic Elevated LFTs Esophageal cancer Cancer of sigmoid colon   Endo/Other  negative endocrine ROS  Renal/GU Renal InsufficiencyRenal disease   Prostate cancer    Musculoskeletal   Abdominal   Peds  Hematology  (+) Blood dyscrasia (Hb 8.2), anemia ,   Anesthesia Other Findings   Reproductive/Obstetrics                            Anesthesia Physical Anesthesia Plan  ASA: 3  Anesthesia Plan: General   Post-op Pain Management:    Induction: Intravenous  PONV Risk Score and Plan: 1 and Ondansetron and Dexamethasone  Airway Management Planned: LMA  Additional Equipment: None  Intra-op Plan:   Post-operative Plan:   Informed Consent: I have reviewed the patients History and Physical, chart, labs and discussed the procedure including the risks, benefits and alternatives for the proposed anesthesia with the patient or authorized representative who has indicated his/her understanding and acceptance.     Dental advisory given  Plan Discussed with: CRNA and Surgeon  Anesthesia Plan Comments:        Anesthesia Quick Evaluation

## 2020-10-11 NOTE — Op Note (Signed)
Operative Note  Pre-operative Diagnosis:  need for venous access for chemotherapy  Post-operative Diagnosis:  same  Surgeon:  Armandina Gemma, MD  Assistant:  none   Procedure:  placement left subclavian vein infusion port  Anesthesia:  general  Estimated Blood Loss:  minimal  Drains: none         Specimen: none  Indications:  Patient is a 75 yo male with three malignancies anticipating the start of chemotherapy.  Chronic venous access is desired.  Procedure:  The patient was seen in the pre-op holding area. The risks, benefits, complications, treatment options, and expected outcomes were previously discussed with the patient. The patient agreed with the proposed plan and has signed the informed consent form.  The patient was brought to the operating room by the surgical team, identified as [name] and the procedure verified. A "time out" was completed and the above information confirmed.  Following induction of general anesthesia, the patient is positioned and then prepped and draped in the usual aseptic fashion.  After ascertaining that an adequate level of anesthesia been achieved, the patient was placed in Trendelenburg.  Using an 18-gauge Seldinger needle the left subclavian vein was accessed.  A J-shaped guidewire was inserted through the needle into position and the needle removed.  C arm fluoroscopy was used to confirm guidewire placement.  Next a 2 cm incision is made over the left chest wall.  Dissection was carried in the subcutaneous tissues and a subcutaneous pocket is created.  Infusion port is assembled and flushed with heparinized saline.  Catheter is then tunneled subcutaneously between the port site and the site of guidewire insertion.  Incision is made around the guidewire.  Port is positioned in the subcutaneous tissues.  Catheter is measured to 24 cm and divided.  Using a dilator and peel-away sleeve the left subclavian vein is accessed.  The guidewire and dilator  are removed.  The catheter is advanced through the peel-away sleeve into position and the peel-away sleeve is removed and discarded.  The port is then accessed and aspirates blood easily and flushes easily with heparinized saline.  C arm fluoroscopy is used to confirm the positioning of the port and the catheter.  Port is flushed with standard strength heparin.  Port is secured to the chest wall with interrupted 2-0 Prolene sutures.  Subcutaneous tissues were then closed with interrupted 3-0 Vicryl sutures.  Skin of both incisions was closed with a running 4-0 Monocryl subcuticular suture.  Wounds are washed and dried and Dermabond was applied as dressing.  A portable chest x-ray will be obtained in the recovery room to confirm Port-A-Cath placement.  The patient tolerated the procedure well.   Armandina Gemma, [place] Office: [phone]

## 2020-10-11 NOTE — Interval H&P Note (Signed)
History and Physical Interval Note:  10/11/2020 11:46 AM  Marcus [name]  has presented today for surgery, with the diagnosis of CANCER.  The various methods of treatment have been discussed with the patient and family. After consideration of risks, benefits and other options for treatment, the patient has consented to    Procedure(s) with comments: INSERTION PORT-A-CATH (N/A) - 60 MINUTES ROOM 4 as a surgical intervention.    The patient's history has been reviewed, patient examined, no change in status, stable for surgery.  I have reviewed the patient's chart and labs.  Questions were answered to the patient's satisfaction.    Armandina Gemma, [facility] [place] [facility] Office: [place]

## 2020-10-11 NOTE — Progress Notes (Signed)
11 Days Post-Op  Subjective: No nausea with g-tube clamped and eating his full liquids.  He is drinking some ensure and eating small amounts per RN.  He did walk short distances yesterday x2.  NPO today for PAC placement.  ROS: See above, otherwise other systems negative  Objective: Vital signs in last 24 hours: Temp:  [97.4 F (36.3 C)-98.5 F (36.9 C)] 98.4 F (36.9 C) (09/27 0344) Pulse Rate:  [73-77] 73 (09/27 0344) Resp:  [20] 20 (09/27 0344) BP: (135-150)/(61-71) 147/61 (09/27 0344) SpO2:  [98 %-99 %] 99 % (09/27 0344) Weight:  [67.2 kg] 67.2 kg (09/26 1140) Last BM Date: 10/09/20 (Colostomy)  Intake/Output from previous day: 09/26 0701 - 09/27 0700 In: -  Out: 220 [Urine:220] Intake/Output this shift: No intake/output data recorded.  PE: Abd: soft, appropriately tender, nondistended, g-tube clamped..  JP drain site is covered and dry.  Midline incision with staples.  Colostomy with viable and pink stoma, with feculent liquid output and gas present  Lab Results:  Recent Labs    10/09/20 0508 10/10/20 0436  WBC 3.8* 3.8*  HGB 8.4* 8.2*  HCT 26.6* 25.8*  PLT 364 371   BMET Recent Labs    10/09/20 0508 10/10/20 0436  NA 135 136  K 3.8 3.7  CL 107 107  CO2 20* 23  GLUCOSE 78 84  BUN 8 5*  CREATININE 0.86 0.93  CALCIUM 8.2* 8.2*   PT/INR No results for input(s): LABPROT, INR in the last 72 hours. CMP     Component Value Date/Time   NA 136 10/10/2020 0436   K 3.7 10/10/2020 0436   CL 107 10/10/2020 0436   CO2 23 10/10/2020 0436   GLUCOSE 84 10/10/2020 0436   BUN 5 (L) 10/10/2020 0436   CREATININE 0.93 10/10/2020 0436   CREATININE 1.01 09/15/2020 1415   CALCIUM 8.2 (L) 10/10/2020 0436   PROT 5.5 (L) 10/10/2020 0436   ALBUMIN 2.4 (L) 10/10/2020 0436   AST 13 (L) 10/10/2020 0436   AST 13 (L) 09/15/2020 1415   ALT 22 10/10/2020 0436   ALT 11 09/15/2020 1415   ALKPHOS 129 (H) 10/10/2020 0436   BILITOT 0.8 10/10/2020 0436   BILITOT 0.7  09/15/2020 1415   GFRNONAA >60 10/10/2020 0436   GFRNONAA >60 09/15/2020 1415   GFRAA >60 09/17/2018 0607   Lipase     Component Value Date/Time   LIPASE 36 09/14/2018 1838       Studies/Results: No results found.  Anti-infectives: Anti-infectives (From admission, onward)    Start     Dose/Rate Route Frequency Ordered Stop   10/11/20 0600  ceFAZolin (ANCEF) IVPB 2g/100 mL premix        2 g 200 mL/hr over 30 Minutes Intravenous On call to O.R. 10/11/20 0418 10/12/20 0559   09/30/20 0900  cefoTEtan (CEFOTAN) 2 g in sodium chloride 0.9 % 100 mL IVPB        2 g 200 mL/hr over 30 Minutes Intravenous On call to O.R. 09/30/20 0800 09/30/20 1421        Assessment/Plan POD 11, s/p lap assisted Hartmann's with g-tube placement by Dr. Redmond Pulling 09/30/20 for Obstructing sigmoid colon mass - g-tube clamped.  Tolerating FLD.  Will adv to soft following PAC placement today -WOC following for new colostomy, but signed off as he will be going to SNF so no further teaching -JP with serous fluid, DC 9/26 -needs to mobilize more, PT/OT eval recommending SNF, likely mid week if he  continues to do well -PAC placement today  FEN - NPO for OR, soft diet to follow, g-tube clamped/IVFs VTE - Lovenox ID - ancef on call to OR  Circumferential non-obstructing esophageal mass - s/p EGD 9/12 and path with poorly differentiated carcinoma - given metastatic disease, esophagectomy or resection seems unlikely to be an option  -g-tube placed in case he needs it moving forward Metastatic prostate cancer - s/p radical prostatectomy and radiation therapy in 2015/2016 - s/p laminectomy for 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 Arnoldo Morale   LOS: 14 days    Marcus [name] , [facility] 10/11/2020, 9:01 AM Please see Amion for pager number during day hours 7:00am-4:30pm or 7:00am -11:30am on weekends

## 2020-10-11 NOTE — Transfer of Care (Signed)
Immediate Anesthesia Transfer of Care Note  Patient: Marcus [name]  Procedure(s) Performed: INSERTION PORT-A-CATH (Chest)  Patient Location: PACU  Anesthesia Type:General  Level of Consciousness: awake, alert  and patient cooperative  Airway & Oxygen Therapy: Patient Spontanous Breathing and Patient connected to face mask oxygen  Post-op Assessment: Report given to RN and Post -op Vital signs reviewed and stable  Post vital signs: Reviewed and stable  Last Vitals:  Vitals Value Taken Time  BP 153/70 10/11/20 1300  Temp    Pulse    Resp 26 10/11/20 1301  SpO2 100% 10/11/20  1301  Vitals shown include unvalidated device data.  Last Pain:  Vitals:   10/11/20 1022  TempSrc:   PainSc: 0-No pain      Patients Stated Pain Goal: 1 (14/84/03 9795)  Complications: No notable events documented.

## 2020-10-11 NOTE — Anesthesia Procedure Notes (Signed)
Procedure Name: LMA Insertion Date/Time: 10/11/2020 12:06 PM Performed by: West Pugh, CRNA Pre-anesthesia Checklist: Patient identified, Emergency Drugs available, Suction available, Patient being monitored and Timeout performed Patient Re-evaluated:Patient Re-evaluated prior to induction Oxygen Delivery Method: Circle system utilized Preoxygenation: Pre-oxygenation with 100% oxygen Induction Type: IV induction LMA: LMA inserted LMA Size: 4.0 Number of attempts: 1 Placement Confirmation: positive ETCO2 Tube secured with: Tape Dental Injury: Teeth and Oropharynx as per pre-operative assessment

## 2020-10-12 ENCOUNTER — Encounter (HOSPITAL_COMMUNITY): Payer: Self-pay | Admitting: Surgery

## 2020-10-12 ENCOUNTER — Ambulatory Visit
Admit: 2020-10-12 | Discharge: 2020-10-12 | Disposition: A | Discharge status: Home / Self Care | Payer: Medicare Other | Attending: Radiation Oncology | Admitting: Radiation Oncology

## 2020-10-12 DIAGNOSIS — C61 Malignant neoplasm of prostate: Secondary | ICD-10-CM

## 2020-10-12 DIAGNOSIS — C154 Malignant neoplasm of middle third of esophagus: Secondary | ICD-10-CM

## 2020-10-12 DIAGNOSIS — C187 Malignant neoplasm of sigmoid colon: Secondary | ICD-10-CM

## 2020-10-12 NOTE — Progress Notes (Signed)
PROGRESS NOTE    Marcus [name]  RWE:[id number] DOB: 02-06-1945 DOA: 09/25/2020 PCP: [name], MD    Chief Complaint  Patient presents with   Back Pain    Brief Narrative:  75 y.o. 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 Arnoldo Morale on 09/18/20 and anemia presented with upper back pain, decreased appetite and black stool.  EGD showed esophageal mass; biopsy consistent with poorly differentiated carcinoma.   colonoscopy showed sigmoid obstructing mass, biopsy + colon cancer.  He underwent Hartman's procedure with end colostomy on 09/30/2020.  Palliative care, oncology and radiation oncology were consulted.  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  X-ray of abdomen and CT of abdomen and pelvis on 10/07/2020 showed postoperative adynamic ileus with interval development of intra and extrahepatic biliary dilatation  -Diet advancement as per general surgery. -wound care as per general surgery recommendations.    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 -s/p feeding tube placement in case future need -s/p port placement  on 9/27  -Palliative care  had evaluated the patient during this hospitalization.  Patient remains full code at this time.  Palliative care following intermittently.   Intra and extrahepatic biliary dilatation -As seen on CT of the abdomen on 10/07/2020.  Unclear if this is from hepatic metastasis.  LFTs not elevated.  Will monitor intermittently    Metastatic prostate cancer Postsurgical back pain -S/p radical prostatectomy and radiation in 2015 -Thoracic spine tumor with cord compression status post T5-6 laminectomy with posterior lateral arthrodesis from T4-T8 by Dr. Arnoldo Morale on 09/18/2020 -Radiation oncology is recommending urology consult for androgen deprivation therapy.  Communicated with Dr. Macdiarmid/urology on 10/05/2020 who saw the patient on 10/05/2020 and recommended outpatient follow-up with Dr. Tresa Moore. 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  Hemoglobin stable  Hyponatremia -Improved. IV fluids plan as above.  Hyperkalemia -Resolved.  Generalized deconditioning -Will eventually need SNF placement   Nutritional Assessment:  The patient's BMI is: Body mass index is 21.26 kg/m.Marland Kitchen  Seen by dietician.  I agree with the assessment and plan as outlined below:  Nutrition Status: Nutrition Problem: Increased nutrient needs Etiology: acute illness Signs/Symptoms: estimated needs    .    Unresulted Labs (From admission, onward)     Start     Ordered   10/18/20 0500  Creatinine, serum  (enoxaparin (LOVENOX)  CrCl >/= 30 mL/min  )  Weekly,   R     Comments: while on enoxaparin therapy.    10/11/20 1602   10/13/20 6195  Basic metabolic panel  Tomorrow morning,   R        10/12/20  1625   10/13/20 0500  Magnesium  Tomorrow morning,   R        10/12/20 1625   10/12/20 1619  SARS CORONAVIRUS 2 (TAT 6-24 HRS) Nasopharyngeal Nasopharyngeal Swab  Once,   R        Question Answer Comment  Is this test for diagnosis or screening Screening   Symptomatic for COVID-19 as defined by CDC No   Hospitalized for COVID-19 No   Admitted to ICU for COVID-19 No   Previously tested for COVID-19 Yes   Resident in a congregate (group) care setting No   Employed in healthcare setting No   Has patient completed COVID vaccination(s) (2 doses of Pfizer/Moderna 1 dose of [employer]) Unknown      10/12/20 1618              DVT prophylaxis: enoxaparin (LOVENOX) injection 40 mg Start: 10/12/20 0800 SCD's Start: 10/11/20 1602   Code Status: Family Communication:  Disposition:   Status is: Inpatient     Dispo: The patient is from: home              Anticipated d/c is to: SNF              Anticipated d/c date is: pending snf bed, covid screening ordered on 9/28                Consultants:  Oncology Radiation oncology General surgery Palliative care GI  Procedures:  As above  Antimicrobials:   Anti-infectives (From admission, onward)    Start     Dose/Rate Route Frequency Ordered Stop   10/11/20 0600  ceFAZolin (ANCEF) IVPB 2g/100 mL premix        2 g 200 mL/hr over 30 Minutes Intravenous On call to O.R. 10/11/20 0418 10/11/20 1237   09/30/20 0900  cefoTEtan (CEFOTAN) 2 g in sodium chloride 0.9 % 100 mL IVPB        2 g 200 mL/hr over 30 Minutes Intravenous On call to O.R. 09/30/20 0800 09/30/20 1421           Objective: Vitals:   10/11/20 1500 10/11/20 2103 10/12/20 0315 10/12/20 1415  BP: (!) 155/82 138/73 (!) 159/74 128/62  Pulse:  93 74 85  Resp:  20 20 16   Temp:  97.8 F (36.6 C) 98 F (36.7 C) 97.8 F (36.6 C)  TempSrc:  Oral Oral Oral  SpO2:  97% 96% 97%  Weight:      Height:        Intake/Output Summary (Last 24 hours) at 10/12/2020 1626 Last data filed at 10/12/2020 1511 Gross per 24 hour  Intake 4147.26 ml  Output 100 ml  Net 4047.26 ml   Filed Weights   09/28/20 0800 10/10/20 1140 10/11/20 1023   Weight: 74.8 kg 67.2 kg 67.2 kg    Examination:  General exam: calm, NAD Respiratory system: Clear to auscultation. Respiratory effort normal. Cardiovascular system: S1 & S2 heard, RRR. Marland Kitchen Gastrointestinal system: + feeing tube, + colostomy, soft and nontender. Normal bowel sounds heard. Central nervous system: Alert and oriented. No focal neurological deficits. Extremities:  No edema Skin: No rashes, lesions or ulcers Psychiatry: Judgement and insight appear normal. Mood & affect appropriate.     Data Reviewed: I have personally reviewed following labs and imaging studies  CBC: Recent Labs  Lab 10/07/20 0447 10/08/20 0515 10/09/20 0508 10/10/20 0436 10/11/20 1641  WBC 4.1 4.0 3.8* 3.8* 6.2  NEUTROABS  --   --  2.0 2.2  --   HGB 9.2* 9.2* 8.4* 8.2* 9.3*  HCT 28.5* 28.7* 26.6* 25.8* 29.6*  MCV 77.7* 77.4* 77.3* 77.2* 78.9*  PLT 362 376 364 371 439*    Basic Metabolic Panel: Recent Labs  Lab 10/06/20 0458 10/07/20 0447 10/07/20 0730 10/08/20 0515 10/09/20 0508 10/10/20 0436 10/11/20 1641  NA 133* 126* 132* 134* 135 136  --   K 4.3 5.8* 4.5 4.0 3.8 3.7  --   CL 105 94* 102 102 107 107  --   CO2 23 24 22 23  20* 23  --   GLUCOSE 118* 117* 103* 107* 78 84  --   BUN 5* <5* <5* 7* 8 5*  --   CREATININE 0.77 1.00 0.86 0.95 0.86 0.93 0.70  CALCIUM 8.5* 8.4* 8.7* 8.7* 8.2* 8.2*  --   MG 2.2 2.0  --  2.0 2.0 1.8  --     GFR: Estimated Creatinine Clearance: 79.3 mL/min (by C-G formula based on SCr of 0.7 mg/dL).  Liver Function Tests: Recent Labs  Lab 10/08/20 0515 10/09/20 0508 10/10/20 0436  AST 20 14* 13*  ALT 46* 29 22  ALKPHOS 201* 153* 129*  BILITOT 0.6 1.0 0.8  PROT 6.3* 5.7* 5.5*  ALBUMIN 2.6* 2.5* 2.4*    CBG: No results for input(s): GLUCAP in the last 168 hours.   No results found for this or any previous visit (from the past 240 hour(s)).       Radiology Studies: DG CHEST PORT 1 VIEW  Result Date: 10/11/2020 CLINICAL DATA:  Postop  Port-A-Cath placement, colon cancer EXAM: PORTABLE CHEST 1 VIEW COMPARISON:  08/31/2020 FINDINGS: Single frontal view of the chest demonstrates left chest wall port via subclavian approach tip overlying the superior vena cava. The cardiac silhouette is unremarkable. No airspace disease, effusion, or pneumothorax. Linear areas of consolidation at the lung bases most compatible with subsegmental atelectasis. Postsurgical changes from thoracic fusion. IMPRESSION: 1. No complication after left-sided chest port placement. 2. Bibasilar subsegmental atelectasis. Electronically Signed   By: Randa Ngo M.D.   On: 10/11/2020 15:39   DG C-Arm 1-60 Min-No Report  Result Date: 10/11/2020 Fluoroscopy was utilized by the requesting physician.  No radiographic interpretation.        Scheduled Meds:  enoxaparin (LOVENOX) injection  40 mg Subcutaneous Q24H   Continuous Infusions:   LOS: 15 days   Time spent: 28mins Greater than 50% of this time was spent in counseling, explanation of diagnosis, planning of further management, and coordination of care.   Voice Recognition Viviann Spare dictation system was used to create this note, attempts have been made to correct errors. Please contact the author with questions and/or clarifications.   Florencia Reasons, MD PhD FACP [facility]  Available via Epic secure chat 7am-7pm for nonurgent issues Please page for urgent issues To page the attending provider between 7A-7P or the covering provider during after hours 7P-7A, please log into the web site [email or web address] and access using universal [place] password for that web site. If you do not have the password, please call the hospital operator.    10/12/2020, 4:26 PM

## 2020-10-12 NOTE — Discharge Instructions (Addendum)
[facility]      [facility] [phone]  OPEN ABDOMINAL SURGERY: POST OP INSTRUCTIONS  Always review your discharge instruction sheet given to you by the facility where your surgery was performed.  IF YOU HAVE DISABILITY OR FAMILY LEAVE FORMS, YOU MUST BRING THEM TO THE OFFICE FOR PROCESSING.  PLEASE DO NOT GIVE THEM TO YOUR DOCTOR.  A prescription for pain medication may be given to you upon discharge.  Take your pain medication as prescribed, if needed.  If narcotic pain medicine is not needed, then you may take acetaminophen (Tylenol) or ibuprofen (Advil) as needed. Take your usually prescribed medications unless otherwise directed. If you need a refill on your pain medication, please contact your pharmacy. They will contact our office to request authorization.  Prescriptions will not be filled after 5pm or on week-ends. You should follow a light diet the first few days after arrival home, such as soup and crackers, pudding, etc.unless your doctor has advised otherwise. A high-fiber, low fat diet can be resumed as tolerated.   Be sure to include lots of fluids daily. Most patients will experience some swelling and bruising on the chest and neck area.  Ice packs will help.  Swelling and bruising can take several days to resolve Most patients will experience some swelling and bruising in the area of the incision. Ice pack will help. Swelling and bruising can take several days to resolve..  It is common to experience some constipation if taking pain medication after surgery.  Increasing fluid intake and taking a stool softener will usually help or prevent this problem from occurring.  A mild laxative (Milk of Magnesia or Miralax) should be taken according to package directions if there are no bowel movements after 48 hours.  You may have steri-strips (small skin tapes) in place directly over the incision.  These strips should be left on the skin for 7-10 days.  If your surgeon used skin  glue on the incision, you may shower in 24 hours.  The glue will flake off over the next 2-3 weeks.  Any sutures or staples will be removed at the office during your follow-up visit. You may find that a light gauze bandage over your incision may keep your staples from being rubbed or pulled. You may shower and replace the bandage daily. ACTIVITIES:  You may resume regular (light) daily activities beginning the next day--such as daily self-care, walking, climbing stairs--gradually increasing activities as tolerated.  You may have sexual intercourse when it is comfortable.  Refrain from any heavy lifting or straining until approved by your doctor. You may drive when you no longer are taking prescription pain medication, you can comfortably wear a seatbelt, and you can safely maneuver your car and apply brakes Return to Work: ___________________________________ You should see your doctor in the office for a follow-up appointment approximately two weeks after your surgery.  Make sure that you call for this appointment within a day or two after you arrive home to insure a convenient appointment time. OTHER INSTRUCTIONS:  _____________________________________________________________ _____________________________________________________________  WHEN TO CALL YOUR DOCTOR: Fever over 101.0 Inability to urinate Nausea and/or vomiting Extreme swelling or bruising Continued bleeding from incision. Increased pain, redness, or drainage from the incision. Difficulty swallowing or breathing Muscle cramping or spasms. Numbness or tingling in hands or feet or around lips.  The clinic staff is available to answer your questions during regular business hours.  Please don't hesitate to call and ask to speak to one of   the nurses if you have concerns.  For further questions, please visit [email or web address]  Leave g-tube clamped unless needed due to dysphagia from esophageal cancer

## 2020-10-12 NOTE — Progress Notes (Signed)
  Radiation Oncology         [phone] ________________________________  Name: KERN GINGRAS MRN: [id number]  Date: 10/12/2020  DOB: 1945-09-23   INPATIENT  SIMULATION AND TREATMENT PLANNING NOTE    ICD-10-CM   1. Prostate cancer ([place])  C61     2. 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[facility])  C15.4     3. Cancer of sigmoid colon ([place])   C18.7       DIAGNOSIS:  75 yo man s/p laminectomy and spinal cord decompression at T6 for metastatic prostate cancer with new diagnosis of obstructing carcinoma of the middle third of the esophagus and sigmoid colon cancer  NARRATIVE:  The patient was brought to the [facility].  Identity was confirmed.  All relevant records and images related to the planned course of therapy were reviewed.  The patient freely provided informed written consent to proceed with treatment after reviewing the details related to the planned course of therapy. The consent form was witnessed and verified by the simulation staff.  Then, the patient was set-up in a stable reproducible  supine position for radiation therapy.  CT images were obtained.  Surface markings were placed.  The CT images were loaded into the planning software.  Then the target and avoidance structures were contoured.  Treatment planning then occurred.  The radiation prescription was entered and confirmed.  Then, I designed and supervised the construction of a total of multiple medically necessary complex treatment devices including MLCs to shield lungs and heart.  I have requested : 3D Simulation  I have requested a DVH of the following structures: left lung, right lung, heart, spinal cord and others.  PLAN:  The patient will receive 30 Gy in 10 fractions to the T-spine and esophagus for palliation.  ________________________________  Sheral Apley Tammi Klippel, M.D.

## 2020-10-12 NOTE — Progress Notes (Signed)
  1 Day Post-Op  Subjective: Looks great today.  Just got back from radiation therapy.  Hungry and ready to order food.  No nausea.    ROS: See above, otherwise other systems negative  Objective: Vital signs in last 24 hours: Temp:  [97.8 F (36.6 C)-98 F (36.7 C)] 98 F (36.7 C) (09/28 0315) Pulse Rate:  [73-96] 74 (09/28 0315) Resp:  [16-26] 20 (09/28 0315) BP: (136-164)/(69-82) 159/74 (09/28 0315) SpO2:  [92 %-97 %] 96 % (09/28 0315) Last BM Date: 10/12/20  Intake/Output from previous day: 09/27 0701 - 09/28 0700 In: 4304.7 [I.V.:4204.7; IV Piggyback:100] Out: 650 [Urine:550; Stool:100] Intake/Output this shift: No intake/output data recorded.  PE: Skin: PAC site c/d/I with no erythema or drainage Abd: soft, appropriately tender, nondistended, g-tube clamped..  JP drain site is covered and dry.  Midline incision with staples.  Colostomy with viable and pink stoma, with feculent liquid output and gas present  Lab Results:  Recent Labs    10/10/20 0436 10/11/20 1641  WBC 3.8* 6.2  HGB 8.2* 9.3*  HCT 25.8* 29.6*  PLT 371 439*   BMET Recent Labs    10/10/20 0436 10/11/20 1641  NA 136  --   K 3.7  --   CL 107  --   CO2 23  --   GLUCOSE 84  --   BUN 5*  --   CREATININE 0.93 0.70  CALCIUM 8.2*  --    PT/INR No results for input(s): LABPROT, INR in the last 72 hours. CMP     Component Value Date/Time   NA 136 10/10/2020 0436   K 3.7 10/10/2020 0436   CL 107 10/10/2020 0436   CO2 23 10/10/2020 0436   GLUCOSE 84 10/10/2020 0436   BUN 5 (L) 10/10/2020 0436   CREATININE 0.70 10/11/2020 1641   CREATININE 1.01 09/15/2020 1415   CALCIUM 8.2 (L) 10/10/2020 0436   PROT 5.5 (L) 10/10/2020 0436   ALBUMIN 2.4 (L) 10/10/2020 0436   AST 13 (L) 10/10/2020 0436   AST 13 (L) 09/15/2020 1415   ALT 22 10/10/2020 0436   ALT 11 09/15/2020 1415   ALKPHOS 129 (H) 10/10/2020 0436   BILITOT 0.8 10/10/2020 0436   BILITOT 0.7 09/15/2020 1415   GFRNONAA >60 10/11/2020  1641   GFRNONAA >60 09/15/2020 1415   GFRAA >60 09/17/2018 0607   Lipase     Component Value Date/Time   LIPASE 36 09/14/2018 1838       Studies/Results: DG CHEST PORT 1 VIEW  Result Date: 10/11/2020 CLINICAL DATA:  Postop Port-A-Cath placement, colon cancer EXAM: PORTABLE CHEST 1 VIEW COMPARISON:  08/31/2020 FINDINGS: Single frontal view of the chest demonstrates left chest wall port via subclavian approach tip overlying the superior vena cava. The cardiac silhouette is unremarkable. No airspace disease, effusion, or pneumothorax. Linear areas of consolidation at the lung bases most compatible with subsegmental atelectasis. Postsurgical changes from thoracic fusion. IMPRESSION: 1. No complication after left-sided chest port placement. 2. Bibasilar subsegmental atelectasis. Electronically Signed   By: Michael  Brown M.D.   On: 10/11/2020 15:39   DG C-Arm 1-60 Min-No Report  Result Date: 10/11/2020 Fluoroscopy was utilized by the requesting physician.  No radiographic interpretation.    Anti-infectives: Anti-infectives (From admission, onward)    Start     Dose/Rate Route Frequency Ordered Stop   10/11/20 0600  ceFAZolin (ANCEF) IVPB 2g/100 mL premix        2 g 200 mL/hr over 30 Minutes Intravenous On   call to O.R. 10/11/20 0418 10/11/20 1237   09/30/20 0900  cefoTEtan (CEFOTAN) 2 g in sodium chloride 0.9 % 100 mL IVPB        2 g 200 mL/hr over 30 Minutes Intravenous On call to O.R. 09/30/20 0800 09/30/20 1421        Assessment/Plan POD 12, s/p lap assisted Hartmann's with g-tube placement by Dr. Redmond Pulling 09/30/20 for Obstructing sigmoid colon mass, POD 1, s/p PAC placement by Dr. Harlow Asa - g-tube clamped.   -regular diet -WOC following for new colostomy, but signed off as he will be going to SNF so no further teaching -JP with serous fluid, DC 9/26 -awaiting SNF placement -DC staples today and apply steri-strips -PAC placement today -surgically stable for DC to SNF when bed  available.  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 Arnoldo Morale   LOS: 15 days    Henreitta Cea , [facility] Surgery 10/12/2020, 11:00 AM Please see Amion for pager number during day hours 7:00am-4:30pm or 7:00am -11:30am on weekends

## 2020-10-12 NOTE — Progress Notes (Signed)
   10/12/20 1300  Mobility  Activity Transferred:  Bed to chair;Stood at bedside  Level of Assistance Modified independent, requires aide device or extra time  Assistive Device None  Distance Ambulated (ft) 5 ft  Mobility Out of bed to chair with meals  Mobility Response Tolerated well  Mobility performed by Mobility specialist  $Mobility charge 1 Mobility   Upon entering, pt reluctant to mobilize in hallway. Agreeable to go from bed to chair. Pt independent to EOB and standing. Needed assistance with IV pole, otherwise independently ambulated from bed to chair. He did note that it is difficult for him to lift his left arm secondary to chest device. Otherwise no complaints. Notified NT that pt would like to be transferred back to bed after Case Management visits the pt.    [place] Specialist Acute Rehab Services Office: [phone]

## 2020-10-13 DIAGNOSIS — C187 Malignant neoplasm of sigmoid colon: Secondary | ICD-10-CM | POA: Diagnosis not present

## 2020-10-13 LAB — BASIC METABOLIC PANEL
Anion gap: 8 (ref 5–15)
BUN: 7 mg/dL — ABNORMAL LOW (ref 8–23)
CO2: 25 mmol/L (ref 22–32)
Calcium: 8.3 mg/dL — ABNORMAL LOW (ref 8.9–10.3)
Chloride: 100 mmol/L (ref 98–111)
Creatinine, Ser: 0.8 mg/dL (ref 0.61–1.24)
GFR, Estimated: >60 mL/min (ref >60)
Glucose, Bld: 94 mg/dL (ref 70–99)
Potassium: 3.9 mmol/L (ref 3.5–5.1)
Sodium: 133 mmol/L — ABNORMAL LOW (ref 135–145)

## 2020-10-13 LAB — MAGNESIUM: Magnesium: 1.8 mg/dL (ref 1.7–2.4)

## 2020-10-13 LAB — SARS CORONAVIRUS 2 (TAT 6-24 HRS): SARS Coronavirus 2: NEGATIVE

## 2020-10-13 MED ORDER — ACETAMINOPHEN 500 MG PO TABS
1000.0000 mg | ORAL_TABLET | Freq: Four times a day (QID) | ORAL | Status: DC
  Administered 2020-10-13 – 2020-10-18 (×16): 1000 mg via ORAL
  Filled 2020-10-13 (×17): qty 2

## 2020-10-13 MED ORDER — MAGNESIUM SULFATE 2 GM/50ML IV SOLN
2.0000 g | Freq: Once | INTRAVENOUS | Status: AC
  Administered 2020-10-13: 2 g via INTRAVENOUS
  Filled 2020-10-13: qty 50

## 2020-10-13 NOTE — TOC Progression Note (Addendum)
Transition of Care [facility]) - Progression Note    Patient Details  Name: Marcus [name] MRN: [id number] Date of Birth: Jan 19, 1945  Transition of Care [facility]) CM/SW [name], Marcus Phone Number: 10/13/2020, 1:52 PM  Clinical Narrative:   9/28: Spoke with patient and brother in law Marcus [name].  Went over bed offers.  Marcus [name] chooses [place] with endorsement of Marcus [name].  [employer] authorization received. Reference #[id number]  9/29-10/3.  Texted Marcus [name] at [facility] to give her information, ask for admission date.  She asked for COVID test, said she would confirm the next day.  9/29: Marcus [name] let me know that they are unable to take Marcus [name] due to his scheduled 2 weeks of radiation.  Went back to Marcus [name] and Marcus [name], informed them, and got permission to approach second choice, Marcus [name]. Marcus [name] said no as they are currently not able to get feeding formula from their vendor.  Left message for Marcus [name] at Marcus [name]. Marcus [name] called back with a no-policy there is no chemo or radiation. It would appear that patient will be here with [place] until he is done with radiation.  I let Marcus Lovelady and Marcus [name] know about the inability to get him in to any of the facilities they chose until after radiation. Messaged MD and oncology with same message.    Expected Discharge Plan: Skilled Nursing Facility Barriers to Discharge: SNF Pending bed offer  Expected Discharge Plan and Services Expected Discharge Plan: Marcus [name]                                               Social Determinants of Health (SDOH) Interventions    Readmission Risk Interventions Readmission Risk Prevention Plan 09/30/2020  Transportation Screening Complete  PCP or Specialist Appt within 5-7 Days Complete  Home Care Screening Complete  Medication Review (RN CM) Complete  Some recent data might be hidden

## 2020-10-13 NOTE — Progress Notes (Signed)
Mobility Specialist - Progress Note    10/13/20 1024  Mobility  Activity Ambulated in hall  Level of Assistance Contact guard assist, steadying assist  Assistive Device Front wheel walker  Distance Ambulated (ft) 25 ft  Mobility Ambulated with assistance in hallway  Mobility Response Tolerated well  Mobility performed by Mobility specialist  $Mobility charge 1 Mobility    Upon entry pt agreed to ambulate and was encouraged to practice log roll technique to sit EOB. Pt required steadying assist to stand at EOB and c/o of abdominal pain once up, but was still agreeable to ambulate. Pt used RW to walk ~25 ft in hallway and stated his abdominal pain was worsening, but did not want pain medication at this time. Pt returned to room after session and was left in bed with call bell at side.   [place] Specialist Acute Rehabilitation Services Phone: [phone] 10/13/20, 10:26 AM

## 2020-10-13 NOTE — Progress Notes (Signed)
PROGRESS NOTE    Marcus [name]  TDS:[id number] DOB: [birth year] DOA: 09/25/2020 PCP: [name], MD    Chief Complaint  Patient presents with   Back Pain    Brief Narrative:  75 y.o. 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 Arnoldo Morale on 09/18/20 and anemia presented with upper back pain, decreased appetite and black stool.  EGD showed esophageal mass; biopsy consistent with poorly differentiated carcinoma.   colonoscopy showed sigmoid obstructing mass, biopsy + colon cancer.  He underwent Hartman's procedure with end colostomy on 09/30/2020.  Palliative care, oncology and radiation oncology were consulted.  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  X-ray of abdomen and CT of abdomen and pelvis on 10/07/2020 showed postoperative adynamic ileus with interval development of intra and extrahepatic biliary dilatation  -Diet advancement as per general surgery. -wound care as per general surgery recommendations.    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 -s/p  feeding tube placement in case future need -s/p port placement  on 9/27  -Palliative care had evaluated the patient during this hospitalization.  Patient remains full code at this time.  Palliative care following intermittently.   Intra and extrahepatic biliary dilatation -As seen on CT of the abdomen on 10/07/2020.  Unclear if this is from hepatic metastasis.  LFTs not elevated.  Will monitor intermittently    Metastatic prostate cancer Postsurgical back pain -S/p radical prostatectomy and radiation in 2015 -Thoracic spine tumor with cord compression status post T5-6 laminectomy with posterior lateral arthrodesis from T4-T8 by Dr. Arnoldo Morale on 09/18/2020 -Radiation oncology is recommending urology consult for androgen deprivation therapy.  Communicated with Dr. Macdiarmid/urology on 10/05/2020 who saw the patient on 10/05/2020 and recommended outpatient follow-up with Dr. Tresa Moore. 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  Hemoglobin stable  Hyponatremia -Improved. IV fluids plan as above.  Hyperkalemia -Resolved.  Generalized deconditioning -Will eventually need SNF placement   Nutritional Assessment:  The patient's BMI is: Body mass index is 21.26 kg/m.Marland Kitchen  Seen by dietician.  I agree with the assessment and plan as outlined below:  Nutrition Status: Nutrition Problem: Increased nutrient needs Etiology: acute illness Signs/Symptoms: estimated needs    .    Unresulted Labs (From admission, onward)     Start     Ordered   10/18/20 0500  Creatinine, serum  (enoxaparin (LOVENOX)  CrCl >/= 30 mL/min  )  Weekly,   R     Comments: while on enoxaparin therapy.    10/11/20 1602  DVT prophylaxis: enoxaparin (LOVENOX) injection 40 mg Start: 10/12/20 0800 SCD's Start: 10/11/20 1602   Code Status: Family Communication:   Disposition:   Status is: Inpatient     Dispo: The patient is from: home              Anticipated d/c is to: SNF              Anticipated d/c date is: pending snf bed, covid screening ordered on 9/28                Consultants:  Oncology Radiation oncology General surgery Palliative care GI  Procedures:  As above  Antimicrobials:   Anti-infectives (From admission, onward)    Start     Dose/Rate Route Frequency Ordered Stop   10/11/20 0600  ceFAZolin (ANCEF) IVPB 2g/100 mL premix        2 g 200 mL/hr over 30 Minutes Intravenous On call to O.R. 10/11/20 0418 10/11/20 1237   09/30/20 0900  cefoTEtan (CEFOTAN) 2 g in sodium chloride 0.9 % 100 mL IVPB        2 g 200 mL/hr over 30 Minutes Intravenous On call to O.R. 09/30/20 0800 09/30/20 1421           Objective: Vitals:   10/12/20 0315 10/12/20 1415 10/12/20 2109 10/13/20 0452  BP: (!) 159/74 128/62 140/68 (!) 148/91  Pulse: 74 85 81 88  Resp: 20 16 19 17   Temp: 98 F (36.7 C) 97.8 F (36.6 C) 98.6 F (37 C) 98.5 F (36.9 C)  TempSrc: Oral Oral    SpO2: 96% 97% 96% 95%  Weight:      Height:        Intake/Output Summary (Last 24 hours) at 10/13/2020 1040 Last data filed at 10/12/2020 2000 Gross per 24 hour  Intake 2456.57 ml  Output --  Net 2456.57 ml   Filed Weights   09/28/20 0800 10/10/20 1140 10/11/20 1023  Weight: 74.8 kg 67.2 kg 67.2 kg    Examination:  General exam: calm, NAD Respiratory system: Clear to auscultation. Respiratory effort normal. Cardiovascular system: S1 & S2 heard, RRR. Marland Kitchen Gastrointestinal system: + feeing tube, + colostomy, soft and nontender. Normal bowel sounds heard. Central nervous system: Alert and oriented. No focal neurological deficits. Extremities:  No edema Skin: No rashes, lesions or ulcers Psychiatry: Judgement and insight appear normal. Mood & affect appropriate.     Data Reviewed: I have personally reviewed following labs and imaging  studies  CBC: Recent Labs  Lab 10/07/20 0447 10/08/20 0515 10/09/20 0508 10/10/20 0436 10/11/20 1641  WBC 4.1 4.0 3.8* 3.8* 6.2  NEUTROABS  --   --  2.0 2.2  --   HGB 9.2* 9.2* 8.4* 8.2* 9.3*  HCT 28.5* 28.7* 26.6* 25.8* 29.6*  MCV 77.7* 77.4* 77.3* 77.2* 78.9*  PLT 362 376 364 371 439*    Basic Metabolic Panel: Recent Labs  Lab 10/07/20 0447 10/07/20 0730 10/08/20 0515 10/09/20 0508 10/10/20 0436 10/11/20 1641 10/13/20 0447  NA 126* 132* 134* 135 136  --  133*  K 5.8* 4.5 4.0 3.8 3.7  --  3.9  CL 94* 102 102 107 107  --  100  CO2 24 22 23  20* 23  --  25  GLUCOSE 117* 103* 107* 78 84  --  94  BUN <5* <5* 7* 8 5*  --  7*  CREATININE 1.00 0.86 0.95 0.86 0.93 0.70 0.80  CALCIUM 8.4* 8.7* 8.7* 8.2* 8.2*  --  8.3*  MG 2.0  --  2.0 2.0 1.8  --  1.8    GFR: Estimated Creatinine Clearance: 79.3 mL/min (by C-G formula based on SCr of 0.8 mg/dL).  Liver Function Tests: Recent Labs  Lab 10/08/20 0515 10/09/20 0508 10/10/20 0436  AST 20 14* 13*  ALT 46* 29 22  ALKPHOS 201* 153* 129*  BILITOT 0.6 1.0 0.8  PROT 6.3* 5.7* 5.5*  ALBUMIN 2.6* 2.5* 2.4*    CBG: No results for input(s): GLUCAP in the last 168 hours.   Recent Results (from the past 240 hour(s))  SARS CORONAVIRUS 2 (TAT 6-24 HRS) Nasopharyngeal Nasopharyngeal Swab     Status: None   Collection Time: 10/12/20 11:09 PM   Specimen: Nasopharyngeal Swab  Result Value Ref Range Status   SARS Coronavirus 2 NEGATIVE NEGATIVE Final    Comment: (NOTE) SARS-CoV-2 target nucleic acids are NOT DETECTED.  The SARS-CoV-2 RNA is generally detectable in upper and lower respiratory specimens during the acute phase of infection. Negative results do not preclude SARS-CoV-2 infection, do not rule out co-infections with other pathogens, and should not be used as the sole basis for treatment or other patient management decisions. Negative results must be combined with clinical observations, patient history, and  epidemiological information. The expected result is Negative.  Fact Sheet for Patients: SugarRoll.be  Fact Sheet for Healthcare Providers: https://www.woods-mathews.com/  This test is not yet approved or cleared by the [place] FDA and  has been authorized for detection and/or diagnosis of SARS-CoV-2 by FDA under an Emergency Use Authorization (EUA). This EUA will remain  in effect (meaning this test can be used) for the duration of the COVID-19 declaration under Se ction 564(b)(1) of the Act, 21 U.S.C. section 360bbb-3(b)(1), unless the authorization is terminated or revoked sooner.  Performed at [facility], [place] [address] [place], [place] [address]          Radiology Studies: DG CHEST PORT 1 VIEW  Result Date: 10/11/2020 CLINICAL DATA:  Postop Port-A-Cath placement, colon cancer EXAM: PORTABLE CHEST 1 VIEW COMPARISON:  08/31/2020 FINDINGS: Single frontal view of the chest demonstrates left chest wall port via subclavian approach tip overlying the superior vena cava. The cardiac silhouette is unremarkable. No airspace disease, effusion, or pneumothorax. Linear areas of consolidation at the lung bases most compatible with subsegmental atelectasis. Postsurgical changes from thoracic fusion. IMPRESSION: 1. No complication after left-sided chest port placement. 2. Bibasilar subsegmental atelectasis. Electronically Signed   By: Randa Ngo M.D.   On: 10/11/2020 15:39   DG C-Arm 1-60 Min-No Report  Result Date: 10/11/2020 Fluoroscopy was utilized by the requesting physician.  No radiographic interpretation.        Scheduled Meds:  acetaminophen  1,000 mg Oral Q6H   enoxaparin (LOVENOX) injection  40 mg Subcutaneous Q24H   Continuous Infusions:  magnesium sulfate bolus IVPB       LOS: 16 days   Time spent: 32mins Greater than 50% of this time was spent in counseling, explanation of diagnosis, planning of further  management, and coordination of care.   Voice Recognition Viviann Spare dictation system was used to create this note, attempts have been made to correct errors. Please contact the author with questions and/or clarifications.   Florencia Reasons, MD PhD FACP [facility]  Available via Epic secure chat 7am-7pm for nonurgent issues Please page for urgent issues To page the attending provider between 7A-7P or the covering provider during after hours 7P-7A, please log into the web site  [email or web address] and access using universal [place] password for that web site. If you do not have the password, please call the hospital operator.    10/13/2020, 10:40 AM

## 2020-10-13 NOTE — Progress Notes (Signed)
2 Days Post-Op  Subjective: Looks good.  Ate about 1/3 of his trays yesterday.    ROS: See above, otherwise other systems negative  Objective: Vital signs in last 24 hours: Temp:  [97.8 F (36.6 C)-98.6 F (37 C)] 98.5 F (36.9 C) (09/29 0452) Pulse Rate:  [81-88] 88 (09/29 0452) Resp:  [16-19] 17 (09/29 0452) BP: (128-148)/(62-91) 148/91 (09/29 0452) SpO2:  [95 %-97 %] 95 % (09/29 0452) Last BM Date: 10/12/20  Intake/Output from previous day: 09/28 0701 - 09/29 0700 In: 2692.6 [P.O.:2286; I.V.:406.6] Out: -  Intake/Output this shift: No intake/output data recorded.  PE: Skin: PAC site c/d/I with no erythema or drainage Abd: soft, appropriately tender, nondistended, g-tube clamped..  JP drain site is covered and dry.  Midline incision with staples out and steri-strips in place.  Colostomy with viable and pink stoma, with feculent liquid output and gas present  Lab Results:  Recent Labs    10/11/20 1641  WBC 6.2  HGB 9.3*  HCT 29.6*  PLT 439*   BMET Recent Labs    10/11/20 1641 10/13/20 0447  NA  --  133*  K  --  3.9  CL  --  100  CO2  --  25  GLUCOSE  --  94  BUN  --  7*  CREATININE 0.70 0.80  CALCIUM  --  8.3*   PT/INR No results for input(s): LABPROT, INR in the last 72 hours. CMP     Component Value Date/Time   NA 133 (L) 10/13/2020 0447   K 3.9 10/13/2020 0447   CL 100 10/13/2020 0447   CO2 25 10/13/2020 0447   GLUCOSE 94 10/13/2020 0447   BUN 7 (L) 10/13/2020 0447   CREATININE 0.80 10/13/2020 0447   CREATININE 1.01 09/15/2020 1415   CALCIUM 8.3 (L) 10/13/2020 0447   PROT 5.5 (L) 10/10/2020 0436   ALBUMIN 2.4 (L) 10/10/2020 0436   AST 13 (L) 10/10/2020 0436   AST 13 (L) 09/15/2020 1415   ALT 22 10/10/2020 0436   ALT 11 09/15/2020 1415   ALKPHOS 129 (H) 10/10/2020 0436   BILITOT 0.8 10/10/2020 0436   BILITOT 0.7 09/15/2020 1415   GFRNONAA >60 10/13/2020 0447   GFRNONAA >60 09/15/2020 1415   GFRAA >60 09/17/2018 0607   Lipase      Component Value Date/Time   LIPASE 36 09/14/2018 1838       Studies/Results: DG CHEST PORT 1 VIEW  Result Date: 10/11/2020 CLINICAL DATA:  Postop Port-A-Cath placement, colon cancer EXAM: PORTABLE CHEST 1 VIEW COMPARISON:  08/31/2020 FINDINGS: Single frontal view of the chest demonstrates left chest wall port via subclavian approach tip overlying the superior vena cava. The cardiac silhouette is unremarkable. No airspace disease, effusion, or pneumothorax. Linear areas of consolidation at the lung bases most compatible with subsegmental atelectasis. Postsurgical changes from thoracic fusion. IMPRESSION: 1. No complication after left-sided chest port placement. 2. Bibasilar subsegmental atelectasis. Electronically Signed   By: Randa Ngo M.D.   On: 10/11/2020 15:39   DG C-Arm 1-60 Min-No Report  Result Date: 10/11/2020 Fluoroscopy was utilized by the requesting physician.  No radiographic interpretation.    Anti-infectives: Anti-infectives (From admission, onward)    Start     Dose/Rate Route Frequency Ordered Stop   10/11/20 0600  ceFAZolin (ANCEF) IVPB 2g/100 mL premix        2 g 200 mL/hr over 30 Minutes Intravenous On call to O.R. 10/11/20 5056 10/11/20 1237   09/30/20 0900  cefoTEtan (CEFOTAN) 2 g in sodium chloride 0.9 % 100 mL IVPB        2 g 200 mL/hr over 30 Minutes Intravenous On call to O.R. 09/30/20 0800 09/30/20 1421        Assessment/Plan POD 13, s/p lap assisted Hartmann's with g-tube placement by Dr. Redmond Pulling 09/30/20 for Obstructing sigmoid colon mass, POD 1, s/p PAC placement by Dr. Harlow Asa - g-tube clamped.   -regular diet, ensure -WOC following for new colostomy, but signed off as he will be going to SNF so no further teaching -JP with serous fluid, DC 9/26 -awaiting SNF placement -DC staples 9/28 -PAC placement 9/27 -surgically stable for DC to SNF when bed available.   -will see prn at this point  FEN - regular diet, ensure, g-tube clamped/IVFs VTE -  Lovenox ID - ancef on call to OR  Circumferential non-obstructing esophageal mass - s/p EGD 9/12 and path with poorly differentiated carcinoma - given metastatic disease, esophagectomy or resection seems unlikely to be an option  -g-tube placed in case he needs it moving forward Metastatic prostate cancer - s/p radical prostatectomy and radiation therapy in 2015/2016 - s/p laminectomy for 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 Arnoldo Morale   LOS: 16 days    Henreitta Cea , [facility] Surgery 10/13/2020, 9:52 AM Please see Amion for pager number during day hours 7:00am-4:30pm or 7:00am -11:30am on weekends

## 2020-10-14 DIAGNOSIS — E871 Hypo-osmolality and hyponatremia: Secondary | ICD-10-CM | POA: Diagnosis not present

## 2020-10-14 DIAGNOSIS — C187 Malignant neoplasm of sigmoid colon: Secondary | ICD-10-CM | POA: Diagnosis not present

## 2020-10-14 DIAGNOSIS — C61 Malignant neoplasm of prostate: Secondary | ICD-10-CM | POA: Diagnosis not present

## 2020-10-14 LAB — BASIC METABOLIC PANEL
Anion gap: 5 (ref 5–15)
BUN: 8 mg/dL (ref 8–23)
CO2: 26 mmol/L (ref 22–32)
Calcium: 8.4 mg/dL — ABNORMAL LOW (ref 8.9–10.3)
Chloride: 105 mmol/L (ref 98–111)
Creatinine, Ser: 0.87 mg/dL (ref 0.61–1.24)
GFR, Estimated: >60 mL/min (ref >60)
Glucose, Bld: 95 mg/dL (ref 70–99)
Potassium: 3.9 mmol/L (ref 3.5–5.1)
Sodium: 136 mmol/L (ref 135–145)

## 2020-10-14 MED ORDER — DEGARELIX ACETATE(240 MG DOSE) 120 MG/VIAL ~~LOC~~ SOLR
240.0000 mg | Freq: Once | SUBCUTANEOUS | Status: AC
  Administered 2020-10-14: 240 mg via SUBCUTANEOUS
  Filled 2020-10-14: qty 6

## 2020-10-14 MED ORDER — COVID-19MRNA BIVAL VACC PFIZER 30 MCG/0.3ML IM SUSP
0.3000 mL | Freq: Once | INTRAMUSCULAR | Status: AC
  Administered 2020-10-15: 0.3 mL via INTRAMUSCULAR
  Filled 2020-10-14: qty 0.3

## 2020-10-14 NOTE — Progress Notes (Signed)
   10/14/20 1400  Mobility  Activity Transferred:  Bed to chair  Level of Assistance Standby assist, set-up cues, supervision of patient - no hands on  Assistive Device None  Mobility Ambulated with assistance in room  Mobility Response Tolerated well  Mobility performed by Mobility specialist  $Mobility charge 1 Mobility   Pt reluctant to mobilize in the hall today, but agreeable to transfer to the chair. Pt went from bed to chair without assistance or assistive device. States he feels like he is weakening, and wants to ambulate more but is constantly tired. Assured pt that it comes with time and to listen to his body for today, and that he will remain on the MS list. Left pt in chair with alarm on and call bell at side. Notified nurse of session.   [place] Specialist Acute Rehab Services Office: [phone]

## 2020-10-14 NOTE — Progress Notes (Signed)
Pt not seen since 2019 at our office, PSA was undetectabel then. Now with metastatic prostate cancer (BX-proven).  I ordered Mills Koller 240mg  that will serve as androgen deprivation as he will be in house for awhile longer.  I will request out office to contact him to re-establish care in our [facility].

## 2020-10-14 NOTE — Progress Notes (Signed)
PROGRESS NOTE    Marcus [name]  ZOX:[id number] DOB: [birth year] DOA: 09/25/2020 PCP: [name], MD    Chief Complaint  Patient presents with   Back Pain    Brief Narrative:  75 y.o. 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 Arnoldo Morale on 09/18/20 and anemia presented with upper back pain, decreased appetite and black stool.  EGD showed esophageal mass; biopsy consistent with poorly differentiated carcinoma.   colonoscopy showed sigmoid obstructing mass, biopsy + colon cancer.  He underwent Hartman's procedure with end colostomy on 09/30/2020.  Palliative care, oncology and radiation oncology were consulted.  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  X-ray of abdomen and CT of abdomen and pelvis on 10/07/2020 showed postoperative adynamic ileus with interval development of intra and extrahepatic biliary dilatation  -Diet advancement as per general surgery. -wound care as per general surgery recommendations.    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 -s/p feeding tube placement in  case future need -s/p port placement  on 9/27  -Palliative care had evaluated the patient during this hospitalization.  Patient remains full code at this time.  Palliative care following intermittently.   Intra and extrahepatic biliary dilatation -As seen on CT of the abdomen on 10/07/2020.  Unclear if this is from hepatic metastasis.  LFTs not elevated.  Will monitor intermittently    Metastatic prostate cancer Postsurgical back pain -S/p radical prostatectomy and radiation in 2015 -Thoracic spine tumor with cord compression status post T5-6 laminectomy with posterior lateral arthrodesis from T4-T8 by Dr. Arnoldo Morale on 09/18/2020 -Radiation oncology is recommending urology consult for androgen deprivation therapy.  Communicated with Dr. Macdiarmid/urology on 10/05/2020 who saw the patient on 10/05/2020 and recommended outpatient follow-up with Dr. Tresa Moore , -appears that patient may not be able to discharge as social worker has not been able to find any accepting facility due to him getting XRT, I have contacted Dr Tresa Moore regarding initiation androgen deprivation therapy , will follow recommendation -Outpatient follow-up with neurosurgery   Pyuria/bacteriuria -Currently being monitored off antibiotics   Leukocytosis -Resolved  Anemia of chronic disease -From cancer.  Hemoglobin stable  Hyponatremia -Improved. Monitor, he does not appear to take enough by mouth, will get calorie count, may need to utilize tube feed nutrition if oral nutrition does not meed his daily nutrition goal Consulted nutrition   Hyperkalemia -Resolved.  Generalized deconditioning -Will eventually need SNF placement   Nutritional Assessment:  The patient's BMI is: Body mass index is 21.26 kg/m.Marland Kitchen  Seen by dietician.  I agree with the assessment and plan as outlined below:  Nutrition Status: Nutrition Problem: Increased nutrient needs Etiology: acute illness Signs/Symptoms: estimated needs    .  Unresulted Labs (From admission, onward)     Start     Ordered   10/18/20 0500  Creatinine, serum  (enoxaparin (LOVENOX)  CrCl >/= 30 mL/min  )  Weekly,   R     Comments: while on enoxaparin therapy.    10/11/20 1602   10/17/20 1554  CBC  Every Monday,   R     Question:  Specimen collection method  Answer:  Lab=Lab collect   10/14/20 1553   10/15/20 8937  Basic metabolic panel  Tomorrow morning,   R       Question:  Specimen collection method  Answer:  Lab=Lab collect   10/14/20 1555   10/15/20 0500  Magnesium  Tomorrow morning,   R       Question:  Specimen collection method  Answer:  Lab=Lab collect   10/14/20 1555   10/14/20 3428  Basic metabolic panel  Every Mon-Wed-Fri,   R (with TIMED occurrences)     Question:  Specimen collection method  Answer:  Lab=Lab collect   10/14/20 1554              DVT prophylaxis: enoxaparin (LOVENOX) injection 40 mg Start: 10/12/20 0800 SCD's Start: 10/11/20 1602   Code Status: full Family Communication: patient  Disposition:   Status is: Inpatient     Dispo: The patient is from: home              Anticipated d/c is to: SNF              Anticipated d/c date is: pending snf bed, covid screening ordered on 9/28                Consultants:  Oncology Radiation oncology General surgery Palliative care GI Urology Neurosurgery   Procedures:  As above  Antimicrobials:   Anti-infectives (From admission, onward)    Start     Dose/Rate Route Frequency Ordered Stop   10/11/20 0600  ceFAZolin (ANCEF) IVPB 2g/100 mL premix        2 g 200 mL/hr over 30 Minutes Intravenous On call to O.R. 10/11/20 0418 10/11/20 1237   09/30/20 0900  cefoTEtan (CEFOTAN) 2 g in sodium chloride 0.9 % 100 mL IVPB        2 g 200 mL/hr over 30 Minutes Intravenous On call to O.R. 09/30/20 0800 09/30/20 1421           Objective: Vitals:   10/13/20 0452 10/13/20 2057 10/14/20 0256 10/14/20 1508  BP: (!) 148/91 130/68 121/69 138/71  Pulse:  88 80 78 80  Resp: 17 20 16    Temp: 98.5 F (36.9 C) 98.3 F (36.8 C) 98.5 F (36.9 C) 98 F (36.7 C)  TempSrc:  Oral  Oral  SpO2: 95% 96% 96% 99%  Weight:      Height:        Intake/Output Summary (Last 24 hours) at 10/14/2020 1556 Last data filed at 10/14/2020 0800 Gross per 24 hour  Intake 170 ml  Output 225 ml  Net -55 ml   Filed Weights   09/28/20 0800 10/10/20 1140 10/11/20 1023  Weight: 74.8 kg 67.2 kg 67.2 kg    Examination:  General exam: calm, NAD Respiratory system: Clear to auscultation. Respiratory effort normal. Chest port on left upper chest  Cardiovascular system: S1 & S2 heard, RRR. Marland Kitchen Gastrointestinal system: + feeing tube, + colostomy, soft and nontender. Normal bowel sounds heard. Central nervous system: Alert and oriented. No focal neurological deficits. Extremities:  No edema Skin: No rashes, lesions or ulcers Psychiatry: Judgement and insight appear normal. Mood & affect appropriate.     Data Reviewed: I have personally reviewed following labs and imaging studies  CBC: Recent Labs  Lab 10/08/20 0515 10/09/20 0508 10/10/20 0436 10/11/20 1641  WBC 4.0 3.8* 3.8* 6.2  NEUTROABS  --  2.0 2.2  --   HGB 9.2* 8.4* 8.2* 9.3*  HCT 28.7* 26.6* 25.8* 29.6*  MCV 77.4* 77.3* 77.2* 78.9*  PLT 376 364 371 439*    Basic Metabolic Panel: Recent Labs  Lab 10/08/20 0515 10/09/20 0508 10/10/20 0436 10/11/20 1641 10/13/20 0447  NA 134* 135 136  --  133*  K 4.0 3.8 3.7  --  3.9  CL 102 107 107  --  100  CO2 23 20* 23  --  25  GLUCOSE 107* 78 84  --  94  BUN 7* 8 5*  --  7*  CREATININE 0.95 0.86 0.93 0.70 0.80  CALCIUM 8.7* 8.2* 8.2*  --  8.3*  MG 2.0 2.0 1.8  --  1.8    GFR: Estimated Creatinine Clearance: 79.3 mL/min (by C-G formula based on SCr of 0.8 mg/dL).  Liver Function Tests: Recent Labs  Lab 10/08/20 0515 10/09/20 0508 10/10/20 0436  AST 20 14* 13*  ALT 46* 29 22  ALKPHOS 201* 153* 129*  BILITOT 0.6 1.0 0.8  PROT 6.3* 5.7*  5.5*  ALBUMIN 2.6* 2.5* 2.4*    CBG: No results for input(s): GLUCAP in the last 168 hours.   Recent Results (from the past 240 hour(s))  SARS CORONAVIRUS 2 (TAT 6-24 HRS) Nasopharyngeal Nasopharyngeal Swab     Status: None   Collection Time: 10/12/20 11:09 PM   Specimen: Nasopharyngeal Swab  Result Value Ref Range Status   SARS Coronavirus 2 NEGATIVE NEGATIVE Final    Comment: (NOTE) SARS-CoV-2 target nucleic acids are NOT DETECTED.  The SARS-CoV-2 RNA is generally detectable in upper and lower respiratory specimens during the acute phase of infection. Negative results do not preclude SARS-CoV-2 infection, do not rule out co-infections with other pathogens, and should not be used as the sole basis for treatment or other patient management decisions. Negative results must be combined with clinical observations, patient history, and epidemiological information. The expected result is Negative.  Fact Sheet for Patients: SugarRoll.be  Fact Sheet for Healthcare Providers: https://www.woods-mathews.com/  This test is not yet approved or cleared by the [place] FDA and  has been authorized for detection and/or diagnosis of SARS-CoV-2 by FDA under an Emergency Use Authorization (EUA). This EUA will remain  in effect (meaning this test can be used) for the duration of the COVID-19 declaration under Se ction 564(b)(1) of the Act, 21 U.S.C. section 360bbb-3(b)(1), unless the authorization is terminated or revoked sooner.  Performed at [facility], [place] [address] [place], [place] [address]          Radiology Studies: No results found.      Scheduled Meds:  acetaminophen  1,000 mg Oral Q6H   [START ON 10/15/2020] COVID-19 mRNA bivalent vaccine (Pfizer)  0.3 mL Intramuscular Once   enoxaparin (LOVENOX) injection  40 mg Subcutaneous Q24H   Continuous Infusions:     LOS: 17 days   Time spent: 31mins Greater than  50% of this time was spent in counseling, explanation of diagnosis, planning of further management, and coordination of care.   Voice Recognition Viviann Spare dictation system was used to create this note, attempts have been  made to correct errors. Please contact the author with questions and/or clarifications.   Florencia Reasons, MD PhD FACP [facility]  Available via Epic secure chat 7am-7pm for nonurgent issues Please page for urgent issues To page the attending provider between 7A-7P or the covering provider during after hours 7P-7A, please log into the web site [email or web address] and access using universal [place] password for that web site. If you do not have the password, please call the hospital operator.    10/14/2020, 3:56 PM

## 2020-10-15 DIAGNOSIS — C187 Malignant neoplasm of sigmoid colon: Secondary | ICD-10-CM | POA: Diagnosis not present

## 2020-10-15 LAB — BASIC METABOLIC PANEL
Anion gap: 4 — ABNORMAL LOW (ref 5–15)
BUN: 7 mg/dL — ABNORMAL LOW (ref 8–23)
CO2: 26 mmol/L (ref 22–32)
Calcium: 8.6 mg/dL — ABNORMAL LOW (ref 8.9–10.3)
Chloride: 106 mmol/L (ref 98–111)
Creatinine, Ser: 0.87 mg/dL (ref 0.61–1.24)
GFR, Estimated: >60 mL/min (ref >60)
Glucose, Bld: 111 mg/dL — ABNORMAL HIGH (ref 70–99)
Potassium: 3.6 mmol/L (ref 3.5–5.1)
Sodium: 136 mmol/L (ref 135–145)

## 2020-10-15 LAB — MAGNESIUM: Magnesium: 1.9 mg/dL (ref 1.7–2.4)

## 2020-10-15 NOTE — Progress Notes (Signed)
Calorie Count Note Acknowledgment   48-hour calorie count ordered by MD to access adequacy of oral intake. PEG is in place, MD would like to start EN feeds if intake is inadequate. Pt is currently being followed by RD and nutrition supplements were previously in place. Discontinued 9/27 - pt reported upset stomach after consuming. Discussed with RN, states that pt has been eating, but is not consuming his entire meal. Is drinking 1 ensure each day when he takes his medicine.   Reviewed [facility] dining records. It appears that pt routinely does not order lunch. Will add Magic Cup to meals as an intervention.  RD not onsite until Monday. Discussed new calorie count order with RN. Meal tickets to be collected over the weekend and oral intake will be assessed for adequacy.  Diet: Regular Supplements: Magic Cup BID  Nutrition Dx: Increased nutrient needs related to acute illness as evidenced by estimated needs.  Goal: Increased nutrient needs related to acute illness as evidenced by estimated needs.  Intervention:  - continue current diet as ordered, encourage PO intake - Magic cup BID with meals, each supplement provides 290 kcal and 9 grams of protein - Multivitamin with minerals daily  Ranell Patrick, RD, LDN Clinical Dietitian Pager on [place]

## 2020-10-15 NOTE — Progress Notes (Signed)
PROGRESS NOTE    Marcus [name]  EXN:[id number] DOB: 10-Oct-1945 DOA: 09/25/2020 PCP: [name], MD    Chief Complaint  Patient presents with   Back Pain    Brief Narrative:  75 y.o. 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 Arnoldo Morale on 09/18/20 and anemia presented with upper back pain, decreased appetite and black stool.  EGD showed esophageal mass; biopsy consistent with poorly differentiated carcinoma.   colonoscopy showed sigmoid obstructing mass, biopsy + colon cancer.  He underwent Hartman's procedure with end colostomy on 09/30/2020.  Palliative care, oncology and radiation oncology were consulted.  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  X-ray of abdomen and CT of abdomen and pelvis on 10/07/2020 showed postoperative adynamic ileus with interval development of intra and extrahepatic biliary dilatation  -Diet advancement as per general surgery. -wound care as per general surgery recommendations.    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 -s/p feeding tube placement in case future need -s/p port placement   on 9/27  -Palliative care had evaluated the patient during this hospitalization.  Patient remains full code at this time.  Palliative care following intermittently.   Intra and extrahepatic biliary dilatation -As seen on CT of the abdomen on 10/07/2020.  Unclear if this is from hepatic metastasis.  LFTs not elevated.  Will monitor intermittently    Metastatic prostate cancer Postsurgical back pain -S/p radical prostatectomy and radiation in 2015 -Thoracic spine tumor with cord compression status post T5-6 laminectomy with posterior lateral arthrodesis from T4-T8 by Dr. Arnoldo Morale on 09/18/2020 -Radiation oncology is recommending urology consult for androgen deprivation therapy.  Communicated with Dr. Macdiarmid/urology on 10/05/2020 who saw the patient on 10/05/2020 and recommended outpatient follow-up with Dr. Tresa Moore , -appears that patient may not be able to discharge as social worker has not been able to find any accepting facility due to him getting XRT, I have contacted Dr Tresa Moore regarding initiation androgen deprivation therapy , will follow recommendation -Outpatient follow-up with neurosurgery   Pyuria/bacteriuria -Currently being monitored off antibiotics   Leukocytosis -Resolved  Anemia of chronic disease -From cancer.  Hemoglobin stable  Hyponatremia -Improved. Monitor, he does not appear to take enough by mouth, will get calorie count, may need to utilize tube feed nutrition if oral nutrition does not meed his daily nutrition goal Consulted nutrition   Hyperkalemia -Resolved.  Generalized deconditioning -Will eventually need SNF placement   Nutritional Assessment:  The patient's BMI is: Body mass index is 21.26 kg/m.Marland Kitchen  Seen by dietician.  I agree with the assessment and plan as outlined below:  Nutrition Status: Nutrition Problem: Increased nutrient needs Etiology: acute illness Signs/Symptoms: estimated needs    .  Unresulted Labs (From admission, onward)      Start     Ordered   10/18/20 0500  Creatinine, serum  (enoxaparin (LOVENOX)  CrCl >/= 30 mL/min  )  Weekly,   R     Comments: while on enoxaparin therapy.    10/11/20 1602   10/17/20 1554  CBC  Every Monday,   R     Question:  Specimen collection method  Answer:  Lab=Lab collect   10/14/20 1553   10/16/20 0500  CBC  Tomorrow morning,   R       Question:  Specimen collection method  Answer:  Lab=Lab collect   10/15/20 0846   10/16/20 0500  Comprehensive metabolic panel  Tomorrow morning,   R       Question:  Specimen collection method  Answer:  Lab=Lab collect   10/15/20 0846   10/14/20 6237  Basic metabolic panel  Every Mon-Wed-Fri,   R     Question:  Specimen collection method  Answer:  Lab=Lab collect   10/14/20 1554              DVT prophylaxis: enoxaparin (LOVENOX) injection 40 mg Start: 10/12/20 0800 SCD's Start: 10/11/20 1602   Code Status: full Family Communication: patient  Disposition:   Status is: Inpatient     Dispo: The patient is from: home              Anticipated d/c is to: SNF              Anticipated d/c date is: pending snf bed, covid screening ordered on 9/28                Consultants:  Oncology Radiation oncology General surgery Palliative care GI Urology Neurosurgery   Procedures:  As above  Antimicrobials:   Anti-infectives (From admission, onward)    Start     Dose/Rate Route Frequency Ordered Stop   10/11/20 0600  ceFAZolin (ANCEF) IVPB 2g/100 mL premix        2 g 200 mL/hr over 30 Minutes Intravenous On call to O.R. 10/11/20 0418 10/11/20 1237   09/30/20 0900  cefoTEtan (CEFOTAN) 2 g in sodium chloride 0.9 % 100 mL IVPB        2 g 200 mL/hr over 30 Minutes Intravenous On call to O.R. 09/30/20 0800 09/30/20 1421           Objective: Vitals:   10/14/20 1508 10/14/20 2112 10/15/20 0419 10/15/20 1318  BP: 138/71 138/73 128/75 (!) 142/71  Pulse: 80 72 77 74  Resp:  20 16 18   Temp: 98 F (36.7 C) 98.4 F (36.9 C)  98.4 F (36.9 C) 98.4 F (36.9 C)  TempSrc: Oral Oral Oral Oral  SpO2: 99% 98% 99% 97%  Weight:      Height:        Intake/Output Summary (Last 24 hours) at 10/15/2020 1746 Last data filed at 10/15/2020 1300 Gross per 24 hour  Intake --  Output 700 ml  Net -700 ml   Filed Weights   09/28/20 0800 10/10/20 1140 10/11/20 1023  Weight: 74.8 kg 67.2 kg 67.2 kg    Examination:  General exam: calm, NAD Respiratory system: Clear to auscultation. Respiratory effort normal. Chest port on left upper chest  Cardiovascular system: S1 & S2 heard, RRR. Marland Kitchen Gastrointestinal system: + feeing tube, + colostomy, soft and nontender. Normal bowel sounds heard. Central nervous system: Alert and oriented. No focal neurological deficits. Extremities:  No edema Skin:  No rashes, lesions or ulcers Psychiatry: Judgement and insight appear normal. Mood & affect appropriate.     Data Reviewed: I have personally reviewed following labs and imaging studies  CBC: Recent Labs  Lab 10/09/20 0508 10/10/20 0436 10/11/20 1641  WBC 3.8* 3.8* 6.2  NEUTROABS 2.0 2.2  --   HGB 8.4* 8.2* 9.3*  HCT 26.6* 25.8* 29.6*  MCV 77.3* 77.2* 78.9*  PLT 364 371 439*    Basic Metabolic Panel: Recent Labs  Lab 10/09/20 0508 10/10/20 0436 10/11/20 1641 10/13/20 0447 10/14/20 1629 10/15/20 0538  NA 135 136  --  133* 136 136  K 3.8 3.7  --  3.9 3.9 3.6  CL 107 107  --  100 105 106  CO2 20* 23  --  25 26 26   GLUCOSE 78 84  --  94 95 111*  BUN 8 5*  --  7* 8 7*  CREATININE 0.86 0.93 0.70 0.80 0.87 0.87  CALCIUM 8.2* 8.2*  --  8.3* 8.4* 8.6*  MG 2.0 1.8  --  1.8  --  1.9    GFR: Estimated Creatinine Clearance: 73 mL/min (by C-G formula based on SCr of 0.87 mg/dL).  Liver Function Tests: Recent Labs  Lab 10/09/20 0508 10/10/20 0436  AST 14* 13*  ALT 29 22  ALKPHOS 153* 129*  BILITOT 1.0 0.8  PROT 5.7* 5.5*  ALBUMIN 2.5* 2.4*    CBG: No results for input(s): GLUCAP in the last 168  hours.   Recent Results (from the past 240 hour(s))  SARS CORONAVIRUS 2 (TAT 6-24 HRS) Nasopharyngeal Nasopharyngeal Swab     Status: None   Collection Time: 10/12/20 11:09 PM   Specimen: Nasopharyngeal Swab  Result Value Ref Range Status   SARS Coronavirus 2 NEGATIVE NEGATIVE Final    Comment: (NOTE) SARS-CoV-2 target nucleic acids are NOT DETECTED.  The SARS-CoV-2 RNA is generally detectable in upper and lower respiratory specimens during the acute phase of infection. Negative results do not preclude SARS-CoV-2 infection, do not rule out co-infections with other pathogens, and should not be used as the sole basis for treatment or other patient management decisions. Negative results must be combined with clinical observations, patient history, and epidemiological information. The expected result is Negative.  Fact Sheet for Patients: SugarRoll.be  Fact Sheet for Healthcare Providers: https://www.woods-mathews.com/  This test is not yet approved or cleared by the [place] FDA and  has been authorized for detection and/or diagnosis of SARS-CoV-2 by FDA under an Emergency Use Authorization (EUA). This EUA will remain  in effect (meaning this test can be used) for the duration of the COVID-19 declaration under Se ction 564(b)(1) of the Act, 21 U.S.C. section 360bbb-3(b)(1), unless the authorization is terminated or revoked sooner.  Performed at [facility], [place] [address] [place], [place] [address]          Radiology Studies: No results found.      Scheduled Meds:  acetaminophen  1,000 mg Oral Q6H   enoxaparin (LOVENOX) injection  40 mg Subcutaneous Q24H   Continuous Infusions:     LOS: 18 days   Time spent: 68mins Greater than 50% of this time was spent in counseling, explanation of diagnosis, planning of further management, and coordination of care.   Voice Recognition Viviann Spare dictation system was  used to create this note, attempts have been made to correct errors. Please contact the author with questions and/or clarifications.   Florencia Reasons, MD PhD FACP [facility]  Available  via Epic secure chat 7am-7pm for nonurgent issues Please page for urgent issues To page the attending provider between 7A-7P or the covering provider during after hours 7P-7A, please log into the web site [email or web address] and access using universal [place] password for that web site. If you do not have the password, please call the hospital operator.    10/15/2020, 5:46 PM

## 2020-10-16 DIAGNOSIS — E871 Hypo-osmolality and hyponatremia: Secondary | ICD-10-CM | POA: Diagnosis not present

## 2020-10-16 DIAGNOSIS — C61 Malignant neoplasm of prostate: Secondary | ICD-10-CM | POA: Diagnosis not present

## 2020-10-16 DIAGNOSIS — C187 Malignant neoplasm of sigmoid colon: Secondary | ICD-10-CM | POA: Diagnosis not present

## 2020-10-16 LAB — CBC
HCT: 27.5 % — ABNORMAL LOW (ref 39.0–52.0)
Hemoglobin: 8.7 g/dL — ABNORMAL LOW (ref 13.0–17.0)
MCH: 24.7 pg — ABNORMAL LOW (ref 26.0–34.0)
MCHC: 31.6 g/dL (ref 30.0–36.0)
MCV: 78.1 fL — ABNORMAL LOW (ref 80.0–100.0)
Platelets: 383 10*3/uL (ref 150–400)
RBC: 3.52 MIL/uL — ABNORMAL LOW (ref 4.22–5.81)
RDW: 20.1 % — ABNORMAL HIGH (ref 11.5–15.5)
WBC: 6.7 10*3/uL (ref 4.0–10.5)
nRBC: 0 % (ref 0.0–0.2)

## 2020-10-16 LAB — COMPREHENSIVE METABOLIC PANEL
ALT: 10 U/L (ref 0–44)
AST: 13 U/L — ABNORMAL LOW (ref 15–41)
Albumin: 2.7 g/dL — ABNORMAL LOW (ref 3.5–5.0)
Alkaline Phosphatase: 114 U/L (ref 38–126)
Anion gap: 4 — ABNORMAL LOW (ref 5–15)
BUN: 7 mg/dL — ABNORMAL LOW (ref 8–23)
CO2: 27 mmol/L (ref 22–32)
Calcium: 8.4 mg/dL — ABNORMAL LOW (ref 8.9–10.3)
Chloride: 103 mmol/L (ref 98–111)
Creatinine, Ser: 0.74 mg/dL (ref 0.61–1.24)
GFR, Estimated: >60 mL/min (ref >60)
Glucose, Bld: 102 mg/dL — ABNORMAL HIGH (ref 70–99)
Potassium: 3.4 mmol/L — ABNORMAL LOW (ref 3.5–5.1)
Sodium: 134 mmol/L — ABNORMAL LOW (ref 135–145)
Total Bilirubin: 0.4 mg/dL (ref 0.3–1.2)
Total Protein: 6.2 g/dL — ABNORMAL LOW (ref 6.5–8.1)

## 2020-10-16 MED ORDER — SODIUM CHLORIDE 0.9 % IV SOLN: INTRAVENOUS | Status: AC

## 2020-10-16 MED ORDER — POTASSIUM CHLORIDE CRYS ER 20 MEQ PO TBCR
40.0000 meq | EXTENDED_RELEASE_TABLET | Freq: Once | ORAL | Status: AC
  Administered 2020-10-16: 40 meq via ORAL
  Filled 2020-10-16: qty 2

## 2020-10-16 NOTE — Plan of Care (Signed)
  Problem: Clinical Measurements: Goal: Ability to maintain clinical measurements within normal limits will improve Outcome: Progressing Goal: Will remain free from infection Outcome: Progressing   Problem: Activity: Goal: Risk for activity intolerance will decrease Outcome: Progressing   Problem: Nutrition: Goal: Adequate nutrition will be maintained Outcome: Progressing   Problem: Coping: Goal: Level of anxiety will decrease Outcome: Progressing   Problem: Pain Managment: Goal: General experience of comfort will improve Outcome: Progressing   Problem: Safety: Goal: Ability to remain free from injury will improve Outcome: Progressing   Problem: Skin Integrity: Goal: Risk for impaired skin integrity will decrease Outcome: Progressing

## 2020-10-16 NOTE — Progress Notes (Addendum)
PROGRESS NOTE    Marcus [name]  ZOX:[id number] DOB: Mar 24, 1945 DOA: 09/25/2020 PCP: [name], MD    Chief Complaint  Patient presents with   Back Pain    Brief Narrative:  75 y.o. 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 Arnoldo Morale on 09/18/20 and anemia presented with upper back pain, decreased appetite and black stool.  EGD showed esophageal mass; biopsy consistent with poorly differentiated carcinoma.   colonoscopy showed sigmoid obstructing mass, biopsy + colon cancer.  He underwent Hartman's procedure with end colostomy on 09/30/2020.  Palliative care, oncology and radiation oncology were consulted.  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    New diagnosis of poorly differentiated esophageal carcinoma, nonobstructing, still able to  eat -Status post EGD on 09/26/2020. -s/p feeding tube placement in case future need -s/p port placement  on 9/27  -Palliative care had evaluated the patient during this hospitalization.  Patient remains full code at this time.  Palliative care following intermittently.   Intra and extrahepatic biliary dilatation -As seen on CT of the abdomen on 10/07/2020.  Unclear if this is from hepatic metastasis.  LFTs not elevated.  Will monitor intermittently    Metastatic prostate cancer Postsurgical back pain -S/p radical prostatectomy and radiation in 2015 -Thoracic spine tumor with cord compression status post T5-6 laminectomy with posterior lateral arthrodesis from T4-T8 by Dr. Arnoldo Morale on 09/18/2020 -Radiation oncology is recommending urology consult for androgen deprivation therapy.  Communicated with Dr. Macdiarmid/urology on 10/05/2020 who saw the patient on 10/05/2020 and recommended outpatient follow-up with Dr. Tresa Moore , -appears that patient may not be able to discharge as social worker has not been able to find any accepting facility due to him getting XRT, I have contacted Dr Tresa Moore regarding initiation androgen deprivation therapy , will follow recommendation -Outpatient follow-up with neurosurgery   Pyuria/bacteriuria -Currently being monitored off antibiotics   Leukocytosis -Resolved  Anemia of chronic disease -From cancer.  Hemoglobin stable  Hyponatremia -Improved. Monitor, he does not appear to take enough by mouth, will get calorie count, may need to utilize tube feed nutrition if oral nutrition does not meed his daily nutrition goal Consulted nutrition    Hypokalemia -replace k  Generalized deconditioning -medically stable to discharge to SNF placement   Nutritional Assessment:  The patient's BMI is: Body mass index is 21.26 kg/m.Marland Kitchen  Seen by dietician.  I agree with the assessment and plan as outlined below:  Nutrition Status: Nutrition Problem: Increased nutrient  needs Etiology: acute illness Signs/Symptoms: estimated needs    .    Unresulted Labs (From admission, onward)     Start     Ordered   10/18/20 0500  Creatinine, serum  (enoxaparin (LOVENOX)  CrCl >/= 30 mL/min  )  Weekly,   R     Comments: while on enoxaparin therapy.    10/11/20 1602   10/17/20 1554  CBC  Every Monday,   R     Question:  Specimen collection method  Answer:  Lab=Lab collect   10/14/20 1553   10/17/20 0500  Magnesium  Tomorrow morning,   R       Question:  Specimen collection method  Answer:  Lab=Lab collect   10/16/20 1026   10/14/20 4696  Basic metabolic panel  Every Mon-Wed-Fri,   R     Question:  Specimen collection method  Answer:  Lab=Lab collect   10/14/20 1554              DVT prophylaxis: enoxaparin (LOVENOX) injection 40 mg Start: 10/12/20 0800 SCD's Start: 10/11/20 1602   Code Status: full Family Communication: patient  Disposition:   Status is: Inpatient    Dispo: The patient is from: home              Anticipated d/c is to: SNF              Anticipated d/c date is: medically stable to discharge, awaiting for snf bed                Consultants:  Oncology Radiation oncology General surgery Palliative care GI Urology Neurosurgery   Procedures:  As above  Antimicrobials:   Anti-infectives (From admission, onward)    Start     Dose/Rate Route Frequency Ordered Stop   10/11/20 0600  ceFAZolin (ANCEF) IVPB 2g/100 mL premix        2 g 200 mL/hr over 30 Minutes Intravenous On call to O.R. 10/11/20 0418 10/11/20 1237   09/30/20 0900  cefoTEtan (CEFOTAN) 2 g in sodium chloride 0.9 % 100 mL IVPB        2 g 200 mL/hr over 30 Minutes Intravenous On call to O.R. 09/30/20 0800 09/30/20 1421           Objective: Vitals:   10/15/20 0419 10/15/20 1318 10/15/20 2052 10/16/20 0456  BP: 128/75 (!) 142/71 (!) 143/71 (!) 148/69  Pulse: 77 74 78 77  Resp: 16 18 20 16   Temp: 98.4 F (36.9 C) 98.4 F (36.9 C) 99 F (37.2 C) 99  F (37.2 C)  TempSrc: Oral Oral Oral Oral  SpO2: 99% 97% 100% 100%  Weight:      Height:        Intake/Output Summary (Last 24 hours) at 10/16/2020 1026 Last data filed at 10/16/2020 0002 Gross per 24 hour  Intake --  Output 525 ml  Net -525 ml   Filed Weights   09/28/20 0800 10/10/20 1140 10/11/20 1023  Weight: 74.8 kg 67.2 kg 67.2 kg    Examination:  General exam: calm, NAD Respiratory system: Clear to auscultation. Respiratory effort normal. Chest port on left upper chest  Cardiovascular system: S1 & S2 heard, RRR. Marland Kitchen Gastrointestinal system: + feeing tube, + colostomy, soft and nontender. Normal bowel sounds heard. Central nervous system: Alert and oriented. No focal neurological deficits. Extremities:  No edema Skin: No rashes, lesions or ulcers Psychiatry: Judgement and insight appear normal. Mood &  affect appropriate.     Data Reviewed: I have personally reviewed following labs and imaging studies  CBC: Recent Labs  Lab 10/10/20 0436 10/11/20 1641 10/16/20 0527  WBC 3.8* 6.2 6.7  NEUTROABS 2.2  --   --   HGB 8.2* 9.3* 8.7*  HCT 25.8* 29.6* 27.5*  MCV 77.2* 78.9* 78.1*  PLT 371 439* 703    Basic Metabolic Panel: Recent Labs  Lab 10/10/20 0436 10/11/20 1641 10/13/20 0447 10/14/20 1629 10/15/20 0538 10/16/20 0527  NA 136  --  133* 136 136 134*  K 3.7  --  3.9 3.9 3.6 3.4*  CL 107  --  100 105 106 103  CO2 23  --  25 26 26 27   GLUCOSE 84  --  94 95 111* 102*  BUN 5*  --  7* 8 7* 7*  CREATININE 0.93 0.70 0.80 0.87 0.87 0.74  CALCIUM 8.2*  --  8.3* 8.4* 8.6* 8.4*  MG 1.8  --  1.8  --  1.9  --     GFR: Estimated Creatinine Clearance: 79.3 mL/min (by C-G formula based on SCr of 0.74 mg/dL).  Liver Function Tests: Recent Labs  Lab 10/10/20 0436 10/16/20 0527  AST 13* 13*  ALT 22 10  ALKPHOS 129* 114  BILITOT 0.8 0.4  PROT 5.5* 6.2*  ALBUMIN 2.4* 2.7*    CBG: No results for input(s): GLUCAP in the last 168 hours.   Recent Results  (from the past 240 hour(s))  SARS CORONAVIRUS 2 (TAT 6-24 HRS) Nasopharyngeal Nasopharyngeal Swab     Status: None   Collection Time: 10/12/20 11:09 PM   Specimen: Nasopharyngeal Swab  Result Value Ref Range Status   SARS Coronavirus 2 NEGATIVE NEGATIVE Final    Comment: (NOTE) SARS-CoV-2 target nucleic acids are NOT DETECTED.  The SARS-CoV-2 RNA is generally detectable in upper and lower respiratory specimens during the acute phase of infection. Negative results do not preclude SARS-CoV-2 infection, do not rule out co-infections with other pathogens, and should not be used as the sole basis for treatment or other patient management decisions. Negative results must be combined with clinical observations, patient history, and epidemiological information. The expected result is Negative.  Fact Sheet for Patients: SugarRoll.be  Fact Sheet for Healthcare Providers: https://www.woods-mathews.com/  This test is not yet approved or cleared by the [place] FDA and  has been authorized for detection and/or diagnosis of SARS-CoV-2 by FDA under an Emergency Use Authorization (EUA). This EUA will remain  in effect (meaning this test can be used) for the duration of the COVID-19 declaration under Se ction 564(b)(1) of the Act, 21 U.S.C. section 360bbb-3(b)(1), unless the authorization is terminated or revoked sooner.  Performed at [facility], [place] [address] [place], [place] [address]          Radiology Studies: No results found.      Scheduled Meds:  acetaminophen  1,000 mg Oral Q6H   enoxaparin (LOVENOX) injection  40 mg Subcutaneous Q24H   potassium chloride  40 mEq Oral Once   Continuous Infusions:     LOS: 19 days   Time spent: 32mins Greater than 50% of this time was spent in counseling, explanation of diagnosis, planning of further management, and coordination of care.   Voice Recognition Viviann Spare dictation  system was used to create this note, attempts have been made to correct errors. Please contact the author with questions and/or clarifications.   Florencia Reasons, MD PhD FACP [facility]  Available  via Epic secure chat 7am-7pm for nonurgent issues Please page for urgent issues To page the attending provider between 7A-7P or the covering provider during after hours 7P-7A, please log into the web site [email or web address] and access using universal [place] password for that web site. If you do not have the password, please call the hospital operator.    10/16/2020, 10:26 AM

## 2020-10-17 ENCOUNTER — Ambulatory Visit
Admission: RE | Admit: 2020-10-17 | Discharge: 2020-10-17 | Disposition: A | Discharge status: Home / Self Care | Payer: Medicare Other | Source: Ambulatory Visit | Attending: Radiation Oncology | Admitting: Radiation Oncology

## 2020-10-17 ENCOUNTER — Encounter (HOSPITAL_COMMUNITY): Payer: Self-pay | Admitting: Internal Medicine

## 2020-10-17 DIAGNOSIS — E43 Unspecified severe protein-calorie malnutrition: Secondary | ICD-10-CM | POA: Insufficient documentation

## 2020-10-17 DIAGNOSIS — C154 Malignant neoplasm of middle third of esophagus: Secondary | ICD-10-CM | POA: Diagnosis not present

## 2020-10-17 DIAGNOSIS — R5381 Other malaise: Secondary | ICD-10-CM

## 2020-10-17 DIAGNOSIS — C187 Malignant neoplasm of sigmoid colon: Secondary | ICD-10-CM | POA: Diagnosis not present

## 2020-10-17 DIAGNOSIS — K2289 Other specified disease of esophagus: Secondary | ICD-10-CM | POA: Diagnosis not present

## 2020-10-17 DIAGNOSIS — K921 Melena: Secondary | ICD-10-CM | POA: Diagnosis not present

## 2020-10-17 LAB — BASIC METABOLIC PANEL
Anion gap: 7 (ref 5–15)
BUN: 11 mg/dL (ref 8–23)
CO2: 24 mmol/L (ref 22–32)
Calcium: 8.8 mg/dL — ABNORMAL LOW (ref 8.9–10.3)
Chloride: 110 mmol/L (ref 98–111)
Creatinine, Ser: 0.79 mg/dL (ref 0.61–1.24)
GFR, Estimated: >60 mL/min (ref >60)
Glucose, Bld: 104 mg/dL — ABNORMAL HIGH (ref 70–99)
Potassium: 4.2 mmol/L (ref 3.5–5.1)
Sodium: 141 mmol/L (ref 135–145)

## 2020-10-17 LAB — MAGNESIUM: Magnesium: 1.7 mg/dL (ref 1.7–2.4)

## 2020-10-17 LAB — CBC
HCT: 28.6 % — ABNORMAL LOW (ref 39.0–52.0)
Hemoglobin: 9 g/dL — ABNORMAL LOW (ref 13.0–17.0)
MCH: 25.1 pg — ABNORMAL LOW (ref 26.0–34.0)
MCHC: 31.5 g/dL (ref 30.0–36.0)
MCV: 79.9 fL — ABNORMAL LOW (ref 80.0–100.0)
Platelets: 397 10*3/uL (ref 150–400)
RBC: 3.58 MIL/uL — ABNORMAL LOW (ref 4.22–5.81)
RDW: 19.9 % — ABNORMAL HIGH (ref 11.5–15.5)
WBC: 5.8 10*3/uL (ref 4.0–10.5)
nRBC: 0 % (ref 0.0–0.2)

## 2020-10-17 LAB — GLUCOSE, CAPILLARY
Glucose-Capillary: 101 mg/dL — ABNORMAL HIGH (ref 70–99)
Glucose-Capillary: 92 mg/dL (ref 70–99)

## 2020-10-17 LAB — PHOSPHORUS: Phosphorus: 3.6 mg/dL (ref 2.5–4.6)

## 2020-10-17 MED ORDER — POTASSIUM CHLORIDE 20 MEQ PO PACK
40.0000 meq | PACK | Freq: Two times a day (BID) | ORAL | Status: AC
  Administered 2020-10-17 (×2): 40 meq via ORAL
  Filled 2020-10-17 (×2): qty 2

## 2020-10-17 MED ORDER — ADULT MULTIVITAMIN W/MINERALS CH
1.0000 | ORAL_TABLET | Freq: Every day | ORAL | Status: DC
  Administered 2020-10-17 – 2020-10-28 (×12): 1
  Filled 2020-10-17 (×12): qty 1

## 2020-10-17 MED ORDER — OSMOLITE 1.5 CAL PO LIQD
474.0000 mL | Freq: Three times a day (TID) | ORAL | Status: DC
  Administered 2020-10-17: 474 mL
  Administered 2020-10-18: 237 mL
  Administered 2020-10-18 – 2020-10-28 (×27): 474 mL
  Filled 2020-10-17 (×34): qty 474

## 2020-10-17 MED ORDER — PROSOURCE TF PO LIQD
45.0000 mL | Freq: Two times a day (BID) | ORAL | Status: DC
  Administered 2020-10-17 – 2020-10-28 (×23): 45 mL
  Filled 2020-10-17 (×23): qty 45

## 2020-10-17 MED ORDER — FREE WATER
100.0000 mL | Freq: Three times a day (TID) | Status: DC
  Administered 2020-10-17 – 2020-10-28 (×31): 100 mL

## 2020-10-17 NOTE — Progress Notes (Signed)
   10/17/20 1300  Mobility  Activity Ambulated in hall  Level of Assistance Contact guard assist, steadying assist  Assistive Device Front wheel walker  Distance Ambulated (ft) 80 ft  Mobility Ambulated with assistance in hallway  Mobility Response Tolerated well  Mobility performed by Mobility specialist  $Mobility charge 1 Mobility   Upon entering the room, NT and pt returning from the restroom. Pt reluctant to mobilize initially, but was agreeable. Ambulated in the hall about 47ft with RW, tolerated well. Left pt in chair with call bell at side and meal tray place in front of him. Notified NT of session.   [place] Specialist Acute Rehab Services Office: [phone]

## 2020-10-17 NOTE — Progress Notes (Addendum)
PROGRESS NOTE    Marcus [name]  LPF:[id number] DOB: 07/17/1945 DOA: 09/25/2020 PCP: Marcus Nova, MD    Brief Narrative:  Patient is a 75 y.o. 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 Arnoldo [name] on 09/18/20 and anemia presented hospital with upper back pain, decreased appetite and black stool.   EGD showed esophageal mass; biopsy consistent with poorly differentiated carcinoma.   Colonoscopy showed sigmoid obstructing mass, biopsy + colon cancer.  He underwent Hartman's procedure with end colostomy on 09/30/2020.  Palliative care, oncology and radiation oncology were consulted.  At this time, patient is stable for discharge awaiting for skilled nursing facility placement and currently getting radiation treatment.  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  Check orthostatic vitals.  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  Patient did have some postoperative ileus which has improved at this time.  Output noted in colostomy.  Has been advanced on oral diet.  Wound care as per general surgery.   New diagnosis of poorly differentiated esophageal carcinoma, nonobstructing, still able to eat some.  Status post EGD and PEG tube placement.  Status post port placement.  Full code.  Palliative care had seen the patient on this admission.   Intra and  extrahepatic biliary dilatation Unsure etiology.  LFTs not elevated   Metastatic prostate cancer/Postsurgical back pain -S/p radical prostatectomy and radiation in 2015 -Thoracic spine tumor with cord compression status post T5-6 laminectomy with posterior lateral arthrodesis from T4-T8 by Dr. Arnoldo [name] on 09/18/2020 -Radiation oncology on board for XRT.  Continue while in hospital.  Urology has recommended [facility] ,will need to follow-up with oncology  neurosurgery and neurology as outpatient   Pyuria/bacteriuria Monitored off antibiotic   Leukocytosis Latest WBC count of 6.7  Anemia of chronic disease Monitor hemoglobin.  Latest hemoglobin of 8.7.  Hyponatremia Mild.  We will continue to monitor  Hypokalemia.  We will continue to replenish.  Check levels in a.m.  Generalized deconditioning Waiting for discharge to skilled nursing facility for  Severe protein calorie malnutrition. Nutrition Problem: Increased nutrient needs Etiology: acute illness Signs/Symptoms: estimated needs.  Seen by dietitian. Patient had calorie count done which estimated less than 50% oral intake.  Dietitian recommends tube feeding to be initiated at this time.   DVT prophylaxis: enoxaparin (LOVENOX) injection 40 mg Start: 10/12/20 0800 SCD's Start: 10/11/20 1602  Code Status:  full code  Family Communication:  None today  Disposition:   Status is: Inpatient   Dispo: The patient is from: home              Anticipated d/c is to: SNF              Anticipated d/c date is: medically stable to discharge, awaiting for skilled nursing facility placement, currently undergoing XRT.  Unable to discharge to skilled nursing facility when getting XRT.  Consultants:  Oncology Radiation oncology General surgery Palliative care GI Urology Neurosurgery   Procedures:  Port-A-Cath placement Feeding tube placement Colostomy  Antimicrobials:   Anti-infectives (From admission, onward)     Start     Dose/Rate Route Frequency Ordered Stop   10/11/20 0600  ceFAZolin (ANCEF) IVPB 2g/100 mL premix        2 g 200 mL/hr over 30 Minutes Intravenous On call to O.R. 10/11/20 0418 10/11/20 1237   09/30/20 0900  cefoTEtan (CEFOTAN) 2 g in sodium chloride 0.9 % 100 mL IVPB        2 g 200 mL/hr over 30 Minutes Intravenous On call to O.R. 09/30/20 0800 09/30/20 1421      Subjective: Today, patient was seen and examined at bedside.  States that he is eating better.  Denies any nausea vomiting or pain.  Last bowel movement was recently.  Dietitian is stated that the patient has been taking less than 50% of oral intake   Objective: Vitals:   10/16/20 0456 10/16/20 1351 10/16/20 2014 10/17/20 0429  BP: (!) 148/69 (!) 160/72 127/70 (!) 155/77  Pulse: 77 75 82 75  Resp: 16 19 20 16   Temp: 99 F (37.2 C) 99 F (37.2 C) 98.4 F (36.9 C) 98.4 F (36.9 C)  TempSrc: Oral Oral Oral Oral  SpO2: 100% 100% 99% 99%  Weight:      Height:        Intake/Output Summary (Last 24 hours) at 10/17/2020 1202 Last data filed at 10/17/2020 1102 Gross per 24 hour  Intake 1209.69 ml  Output 1125 ml  Net 84.69 ml    Filed Weights   09/28/20 0800 10/10/20 1140 10/11/20 1023  Weight: 74.8 kg 67.2 kg 67.2 kg    Physical examination: General:  Average built, not in obvious distress HENT:   No scleral pallor or icterus noted. Oral mucosa is moist.  Chest:  Clear breath sounds.  Diminished breath sounds bilaterally. No crackles or wheezes.  Small port in place. CVS: S1 &S2 heard. No murmur.  Regular rate and rhythm. Abdomen: Soft, nontender, nondistended.  Bowel sounds are heard.  Ostomy bag in place.  Feeding tube in place. Extremities: No cyanosis, clubbing or edema.  Peripheral pulses are palpable. Psych: Alert, awake and oriented, normal mood CNS:  No cranial nerve deficits.  Power equal in all extremities.   Skin: Warm and dry.  No rashes noted.  Data Reviewed: I have personally reviewed  following labs and imaging studies  CBC: Recent Labs  Lab 10/11/20 1641 10/16/20 0527  WBC 6.2 6.7  HGB 9.3* 8.7*  HCT 29.6* 27.5*  MCV 78.9* 78.1*  PLT 439* 383     Basic Metabolic Panel: Recent Labs  Lab 10/11/20 1641 10/13/20 0447 10/14/20 1629 10/15/20 0538 10/16/20 0527 10/17/20 0442  NA  --  133* 136 136 134*  --   K  --  3.9 3.9 3.6 3.4*  --   CL  --  100 105 106 103  --   CO2  --  25 26 26 27   --   GLUCOSE  --  94 95 111* 102*  --   BUN  --  7* 8 7* 7*  --   CREATININE 0.70 0.80 0.87 0.87 0.74  --   CALCIUM  --  8.3* 8.4* 8.6* 8.4*  --   MG  --  1.8  --  1.9  --  1.7     GFR: Estimated Creatinine Clearance: 79.3 mL/min (by  C-G formula based on SCr of 0.74 mg/dL).  Liver Function Tests: Recent Labs  Lab 10/16/20 0527  AST 13*  ALT 10  ALKPHOS 114  BILITOT 0.4  PROT 6.2*  ALBUMIN 2.7*     CBG: No results for input(s): GLUCAP in the last 168 hours.   Recent Results (from the past 240 hour(s))  SARS CORONAVIRUS 2 (TAT 6-24 HRS) Nasopharyngeal Nasopharyngeal Swab     Status: None   Collection Time: 10/12/20 11:09 PM   Specimen: Nasopharyngeal Swab  Result Value Ref Range Status   SARS Coronavirus 2 NEGATIVE NEGATIVE Final    Comment: (NOTE) SARS-CoV-2 target nucleic acids are NOT DETECTED.  The SARS-CoV-2 RNA is generally detectable in upper and lower respiratory specimens during the acute phase of infection. Negative results do not preclude SARS-CoV-2 infection, do not rule out co-infections with other pathogens, and should not be used as the sole basis for treatment or other patient management decisions. Negative results must be combined with clinical observations, patient history, and epidemiological information. The expected result is Negative.  Fact Sheet for Patients: SugarRoll.be  Fact Sheet for Healthcare Providers: https://www.woods-mathews.com/  This test is not yet approved or cleared  by the [place] FDA and  has been authorized for detection and/or diagnosis of SARS-CoV-2 by FDA under an Emergency Use Authorization (EUA). This EUA will remain  in effect (meaning this test can be used) for the duration of the COVID-19 declaration under Se ction 564(b)(1) of the Act, 21 U.S.C. section 360bbb-3(b)(1), unless the authorization is terminated or revoked sooner.  Performed at [facility], [place] [address], [place], [place] [address]       Radiology Studies: No results found.    Scheduled Meds:  acetaminophen  1,000 mg Oral Q6H   enoxaparin (LOVENOX) injection  40 mg Subcutaneous Q24H   Continuous Infusions:  sodium chloride 75 mL/hr at 10/17/20 0324      LOS: 20 days   Flora Lipps, MD  [facility] 10/17/2020, 12:02 PM

## 2020-10-17 NOTE — Progress Notes (Signed)
Nutrition Follow-up  DOCUMENTATION CODES:   Severe malnutrition in context of chronic illness  INTERVENTION:   - MVI w/ minerals daily   - Pt at risk for refeeding risk  Bolus TF via PEG: - 2 cartons of Osmolite 1.5 - TID - 45 mL ProSource TF - BID - 50 mL water flush before and after each bolus - 200 mL water flushes - TID between boluses  Provides 2216 kcal, 111 gm of PRO, 1086 mL of free water (1986 mL total water including flushes)   NUTRITION DIAGNOSIS:   Severe Malnutrition related to chronic illness (cancer) as evidenced by moderate fat depletion, severe muscle depletion, percent weight loss, energy intake < or equal to 75% for > or equal to 1 month.  GOAL:   Patient will meet greater than or equal to 90% of their needs - Ongoing  MONITOR:   PO intake, TF tolerance, Weight trends, Labs  REASON FOR ASSESSMENT:   Consult Calorie Count, Enteral/tube feeding initiation and management  ASSESSMENT:   75 y.o. 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 Arnoldo Morale on 9/4 and anemia returning with upper back pain, decreased appetite and black stool.  9/4: s/p laminectomy for thoracic tumor  9/11: admitted 9/12: s/p EGD 9/14: s/p flex sigmoidoscopy 9/16: s/p lap assisted Hartman's procedure with end colostomy, PEG tube placement 9/17: FLD 9/18: NPO 9/21: CLD 9/22: FLD 9/23: NPO 9/25: CLD 9/26: FLD 9/27: Regular diet  Pt reports that he has been feeling okay. He stated that he cut his tongue on his tooth and it is now hard to swallow and certain foods hurt when they get into that cut.    48-hour Calorie Count  Day 1 10/1: Breakfast: 179 kcal, 9 gm PRO Lunch: No Documentation Dinner: 83 kcal, 5 gm PRO Supplements: 290 kcal, 9 gm PRO  Total intake: 552 kcal (31% of minimum  estimated needs)  23 protein (27% of minimum estimated needs)  Day 2 10/2: Breakfast: No Documentation Lunch: No Documentation Dinner: 205 kcal, 15 gm PRO  Total intake: 205 kcal (11% of minimum estimated needs)  15 protein (18% of minimum estimated needs)  Per calorie count, pt is eating <50% of his meals. Discussed results of calorie count with MD, received ok to start enteral feeds via PEG. Pt is at risk for refeeding syndrome.   Per EMR, pt weighed 71.4 kg at the beginning of September at the Medical Oncology visit. Pt now weighs 67.2 kg, this would be a 5.8% weight loss in 1 month.  Medications reviewed and include: Potassium Chloride Labs reviewed: Magnesium 1.7, BUN 7, Potassium 3.4, Sodium 134  NUTRITION - FOCUSED PHYSICAL EXAM:  Flowsheet Row Most Recent Value  Orbital Region Moderate depletion  Upper Arm Region Moderate depletion  Thoracic and Lumbar Region Moderate depletion  Buccal Region Moderate depletion  Temple Region Moderate depletion  Clavicle Bone Region Moderate depletion  Clavicle and Acromion Bone Region Moderate depletion  Scapular Bone Region Moderate depletion  Dorsal Hand Moderate depletion  Patellar Region Severe depletion  Anterior Thigh Region Severe depletion  Posterior Calf Region Severe depletion  Edema (RD Assessment) None  Hair Reviewed  Eyes Reviewed  Mouth Reviewed  Skin Reviewed  Nails Reviewed       Diet Order:   Diet Order  Diet regular Room service appropriate? Yes; Fluid consistency: Thin  Diet effective now                   EDUCATION NEEDS:   No education needs have been identified at this time  Skin:  Skin Assessment: Skin Integrity Issues: Skin Integrity Issues:: Incisions Incisions: 9/11 vertebral column, 9/16 abdomen  Last BM:  Colostomy  Height:   Ht Readings from Last 1 Encounters:  10/11/20 5\' 10"  (1.778 m)    Weight:   Wt Readings from Last 1 Encounters:  10/11/20 67.2 kg     Ideal Body Weight:  75.5 kg  BMI:  Body mass index is 21.26 kg/m.  Estimated Nutritional Needs:   Kcal:  2100-2300  Protein:  105-120 grams  Fluid:  >/= 2.1 L   Illianna Paschal BS, PLDN Clinical Dietitian See AMiON for contact information.

## 2020-10-18 ENCOUNTER — Ambulatory Visit
Admit: 2020-10-18 | Discharge: 2020-10-18 | Disposition: A | Discharge status: Home / Self Care | Payer: Medicare Other | Attending: Radiation Oncology | Admitting: Radiation Oncology

## 2020-10-18 DIAGNOSIS — C154 Malignant neoplasm of middle third of esophagus: Secondary | ICD-10-CM | POA: Diagnosis not present

## 2020-10-18 DIAGNOSIS — K2289 Other specified disease of esophagus: Secondary | ICD-10-CM | POA: Diagnosis not present

## 2020-10-18 DIAGNOSIS — C187 Malignant neoplasm of sigmoid colon: Secondary | ICD-10-CM | POA: Diagnosis not present

## 2020-10-18 DIAGNOSIS — K921 Melena: Secondary | ICD-10-CM | POA: Diagnosis not present

## 2020-10-18 LAB — BASIC METABOLIC PANEL
Anion gap: 7 (ref 5–15)
BUN: 9 mg/dL (ref 8–23)
CO2: 23 mmol/L (ref 22–32)
Calcium: 8.9 mg/dL (ref 8.9–10.3)
Chloride: 107 mmol/L (ref 98–111)
Creatinine, Ser: 0.63 mg/dL (ref 0.61–1.24)
GFR, Estimated: >60 mL/min (ref >60)
Glucose, Bld: 111 mg/dL — ABNORMAL HIGH (ref 70–99)
Potassium: 4.4 mmol/L (ref 3.5–5.1)
Sodium: 137 mmol/L (ref 135–145)

## 2020-10-18 LAB — CBC
HCT: 28.3 % — ABNORMAL LOW (ref 39.0–52.0)
Hemoglobin: 8.9 g/dL — ABNORMAL LOW (ref 13.0–17.0)
MCH: 24.7 pg — ABNORMAL LOW (ref 26.0–34.0)
MCHC: 31.4 g/dL (ref 30.0–36.0)
MCV: 78.4 fL — ABNORMAL LOW (ref 80.0–100.0)
Platelets: 327 10*3/uL (ref 150–400)
RBC: 3.61 MIL/uL — ABNORMAL LOW (ref 4.22–5.81)
RDW: 19.9 % — ABNORMAL HIGH (ref 11.5–15.5)
WBC: 5.4 10*3/uL (ref 4.0–10.5)
nRBC: 0 % (ref 0.0–0.2)

## 2020-10-18 LAB — GLUCOSE, CAPILLARY
Glucose-Capillary: 110 mg/dL — ABNORMAL HIGH (ref 70–99)
Glucose-Capillary: 94 mg/dL (ref 70–99)
Glucose-Capillary: 113 mg/dL — ABNORMAL HIGH (ref 70–99)
Glucose-Capillary: 90 mg/dL (ref 70–99)
Glucose-Capillary: 113 mg/dL — ABNORMAL HIGH (ref 70–99)
Glucose-Capillary: 106 mg/dL — ABNORMAL HIGH (ref 70–99)

## 2020-10-18 LAB — PHOSPHORUS
Phosphorus: 3.1 mg/dL (ref 2.5–4.6)
Phosphorus: 3.2 mg/dL (ref 2.5–4.6)

## 2020-10-18 LAB — MAGNESIUM
Magnesium: 1.8 mg/dL (ref 1.7–2.4)
Magnesium: 2.2 mg/dL (ref 1.7–2.4)

## 2020-10-18 MED ORDER — TRAMADOL HCL 50 MG PO TABS
50.0000 mg | ORAL_TABLET | Freq: Four times a day (QID) | ORAL | Status: DC | PRN
Start: 2020-10-18 — End: 2020-10-29
  Administered 2020-10-18 – 2020-10-28 (×6): 50 mg
  Filled 2020-10-18 (×7): qty 1

## 2020-10-18 MED ORDER — ACETAMINOPHEN 500 MG PO TABS
1000.0000 mg | ORAL_TABLET | Freq: Four times a day (QID) | ORAL | Status: DC
  Administered 2020-10-18 – 2020-10-26 (×26): 1000 mg
  Filled 2020-10-18 (×30): qty 2

## 2020-10-18 NOTE — Progress Notes (Signed)
PROGRESS NOTE    Marcus [name]  DJT:[id number] DOB: 02/25/1945 DOA: 09/25/2020 PCP: [name], MD    Brief Narrative:   Patient is a 75 y.o. 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 Arnoldo Morale on 09/18/20 and anemia presented hospital with upper back pain, decreased appetite and black stool.   EGD showed esophageal mass; biopsy consistent with poorly differentiated carcinoma.   Colonoscopy showed sigmoid obstructing mass, biopsy + colon cancer.  He underwent Hartman's procedure with end colostomy on 09/30/2020.  Palliative care, oncology and radiation oncology were consulted.  At this time, patient is stable for discharge awaiting for skilled nursing facility placement but since getting radiation treatment unable to get skilled nursing facility.  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  Received IV fluids.  Check orthostatic vitals when mobilized with PT..  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  Patient did have some postoperative ileus which has improved at this time.  Output noted in colostomy.  Tolerating oral diet.  Ostomy care as per wound care nursing.  New diagnosis of poorly differentiated esophageal carcinoma, nonobstructing, still able to eat some but inadequate intake..  Status post EGD and PEG tube placement.  Status post port placement.   Has been started on  PEG tube feeding after 48-hour calorie count.  Patient has met less than 50% of his needs via oral intake.  Intra and extrahepatic biliary dilatation Unsure etiology.  LFTs not elevated   Metastatic prostate cancer/Postsurgical back pain -S/p radical prostatectomy and radiation in 2015. -Thoracic spine tumor with cord compression status post T5-6 laminectomy with posterior lateral arthrodesis from T4-T8 by Dr. Arnoldo Morale on 09/18/2020. Radiation oncology on board for XRT.  Patient has been receiving XRT while in hospital.  Urology ordered Firmagon 240 mg for androgen depletion therapy on 10/14/2020.,will need to follow-up with oncology,  neurosurgery, urology and neurology as outpatient   Pyuria/bacteriuria, leukocytosis monitored off antibiotic   Anemia of chronic disease Periodically monitor hemoglobin.  Latest hemoglobin of 8.7>8.9.  Hyponatremia Resolved.  Hypokalemia.  Resolved.  Generalized deconditioning, debility Waiting for discharge to skilled nursing facility after completion of radiation treatment  Severe protein calorie malnutrition. Nutrition Problem: Severe Malnutrition Etiology: chronic illness (cancer) Signs/Symptoms: moderate fat depletion, severe muscle depletion, percent weight loss, energy intake < or equal to 75% for > or equal to 1 month Percent weight loss: 5.8 %.  Seen by dietitian. Patient had calorie count done which estimated less than 50% oral intake.  Tube feeding has been initiated.  DVT prophylaxis: enoxaparin (LOVENOX) injection 40 mg Start: 10/12/20 0800 SCD's Start: 10/11/20 1602  Code Status:  full code  Family Communication:  None   Disposition:   Status is: Inpatient   Dispo: The patient is from: home              Anticipated d/c is to: SNF  Anticipated d/c date is: medically stable to discharge, awaiting for skilled nursing facility placement, currently undergoing XRT.  Unable to discharge to skilled nursing facility when getting  XRT.               Consultants:  Oncology Radiation oncology General surgery Palliative care GI Urology Neurosurgery   Procedures:  Port-A-Cath placement Feeding tube placement Colostomy Tube feeding  Antimicrobials:   Anti-infectives (From admission, onward)    Start     Dose/Rate Route Frequency Ordered Stop   10/11/20 0600  ceFAZolin (ANCEF) IVPB 2g/100 mL premix        2 g 200 mL/hr over 30 Minutes Intravenous On call to O.R. 10/11/20 0418 10/11/20 1237   09/30/20 0900  cefoTEtan (CEFOTAN) 2 g in sodium chloride 0.9 % 100 mL IVPB        2 g 200 mL/hr over 30 Minutes Intravenous On call to O.R. 09/30/20 0800 09/30/20 1421      Subjective: Today, patient was seen and examined at bedside.  Complains of mild pain on the back and abdominal area.  Patient denies any nausea, vomiting.  Has been having output through the colostomy tube.  Objective: Vitals:   10/17/20 0429 10/17/20 2110 10/18/20 0317 10/18/20 0500  BP: (!) 155/77 (!) 159/68 136/68   Pulse: 75 77 91   Resp: _0 Temp: 98.4 F (36.9 C) 98.4 F (36.9 C) 98.4 F (36.9 C)   TempSrc: Oral Oral Oral   SpO2: 99% 99% 99%   Weight:  69.4 kg  69.4 kg  Height:        Intake/Output Summary (Last 24 hours) at 10/18/2020 1203 Last data filed at 10/18/2020 1047 Gross per 24 hour  Intake 1148 ml  Output 450 ml  Net 698 ml    Filed Weights   10/11/20 1023 10/17/20 2110 10/18/20 0500  Weight: 67.2 kg 69.4 kg 69.4 kg   Body mass index is 21.95 kg/m.   Physical examination: General:  Average built, not in obvious distress HENT:   No scleral pallor or icterus noted. Oral mucosa is moist.  Chest:  Clear breath sounds.  Diminished breath sounds bilaterally. No crackles or wheezes.  Port-A-Cath in place. CVS: S1 &S2 heard. No murmur.  Regular rate and rhythm. Abdomen: Soft, nontender, nondistended.  Bowel sounds are heard.  Ostomy bag in place, feeding tube in place.   Extremities: No cyanosis, clubbing or  edema.  Peripheral pulses are palpable. Psych: Alert, awake and oriented, normal mood CNS:  No cranial nerve deficits.  Moves all extremities. Skin: Warm and dry.  No rashes noted.  Data Reviewed: I have personally reviewed following labs and imaging studies  CBC: Recent Labs  Lab 10/11/20 1641 10/16/20 0527 10/17/20 1741 10/18/20 0503  WBC 6.2 6.7 5.8 5.4  HGB 9.3* 8.7* 9.0* 8.9*  HCT 29.6* 27.5* 28.6* 28.3*  MCV 78.9* 78.1* 79.9* 78.4*  PLT 439* 383 397 327     Basic Metabolic Panel: Recent Labs  Lab 10/13/20 0447 10/14/20 1629 10/15/20 0538 10/16/20 0527 10/17/20 0442 10/17/20 1741 10/18/20 0503  NA 133* 136 136 134*  --  141 137  K 3.9 3.9 3.6 3.4*  --  4.2 4.4  CL 100 105 106 103  --  110 107  CO2 _1 --  24 23  GLUCOSE 94 95 111* 102*  --  104* 111*  BUN 7* 8 7* 7*  --  11 9  CREATININE 0.80  0.87 0.87 0.74  --  0.79 0.63  CALCIUM 8.3* 8.4* 8.6* 8.4*  --  8.8* 8.9  MG 1.8  --  1.9  --  1.7  --  1.8  PHOS  --   --   --   --   --  3.6 3.1     GFR: Estimated Creatinine Clearance: 81.9 mL/min (by C-G formula based on SCr of 0.63 mg/dL).  Liver Function Tests: Recent Labs  Lab 10/16/20 0527  AST 13*  ALT 10  ALKPHOS 114  BILITOT 0.4  PROT 6.2*  ALBUMIN 2.7*     CBG: Recent Labs  Lab 10/17/20 2114 10/17/20 2310 10/18/20 0319 10/18/20 0738 10/18/20 1156  GLUCAP 101* 92 110* 94 113*     Recent Results (from the past 240 hour(s))  SARS CORONAVIRUS 2 (TAT 6-24 HRS) Nasopharyngeal Nasopharyngeal Swab     Status: None   Collection Time: 10/12/20 11:09 PM   Specimen: Nasopharyngeal Swab  Result Value Ref Range Status   SARS Coronavirus 2 NEGATIVE NEGATIVE Final    Comment: (NOTE) SARS-CoV-2 target nucleic acids are NOT DETECTED.  The SARS-CoV-2 RNA is generally detectable in upper and lower respiratory specimens during the acute phase of infection. Negative results do not preclude SARS-CoV-2 infection, do not rule out co-infections  with other pathogens, and should not be used as the sole basis for treatment or other patient management decisions. Negative results must be combined with clinical observations, patient history, and epidemiological information. The expected result is Negative.  Fact Sheet for Patients: SugarRoll.be  Fact Sheet for Healthcare Providers: https://www.woods-mathews.com/  This test is not yet approved or cleared by the [place] FDA and  has been authorized for detection and/or diagnosis of SARS-CoV-2 by FDA under an Emergency Use Authorization (EUA). This EUA will remain  in effect (meaning this test can be used) for the duration of the COVID-19 declaration under Se ction 564(b)(1) of the Act, 21 U.S.C. section 360bbb-3(b)(1), unless the authorization is terminated or revoked sooner.  Performed at [facility], [place] [address], [place], [place] [address]       Radiology Studies: No results found.    Scheduled Meds:  acetaminophen  1,000 mg Oral Q6H   enoxaparin (LOVENOX) injection  40 mg Subcutaneous Q24H   feeding supplement (OSMOLITE 1.5 CAL)  474 mL Per Tube TID PC   feeding supplement (PROSource TF)  45 mL Per Tube BID   free water  100 mL Per Tube TID   multivitamin with minerals  1 tablet Per Tube Daily   Continuous Infusions:    LOS: 21 days   Flora Lipps, MD  [facility] 10/18/2020, 12:03 PM

## 2020-10-18 NOTE — Progress Notes (Addendum)
Pt states no chest pain. Will continue monitor the pt

## 2020-10-18 NOTE — Progress Notes (Signed)
Pt complained of chest pain not radiating anywhere stating pain 6.he said it started after taking the pills. MD notified told to give some pain medicine and ordered an EKG. Given prn medicine.EKG in the system. Pt said his pain at 5 . Will continue monitor the pt.

## 2020-10-19 ENCOUNTER — Ambulatory Visit
Admit: 2020-10-19 | Discharge: 2020-10-19 | Disposition: A | Discharge status: Home / Self Care | Payer: Medicare Other | Attending: Radiation Oncology | Admitting: Radiation Oncology

## 2020-10-19 DIAGNOSIS — Z95828 Presence of other vascular implants and grafts: Secondary | ICD-10-CM

## 2020-10-19 DIAGNOSIS — E871 Hypo-osmolality and hyponatremia: Secondary | ICD-10-CM | POA: Diagnosis not present

## 2020-10-19 DIAGNOSIS — C189 Malignant neoplasm of colon, unspecified: Secondary | ICD-10-CM | POA: Diagnosis not present

## 2020-10-19 DIAGNOSIS — E43 Unspecified severe protein-calorie malnutrition: Secondary | ICD-10-CM

## 2020-10-19 DIAGNOSIS — C154 Malignant neoplasm of middle third of esophagus: Secondary | ICD-10-CM | POA: Diagnosis not present

## 2020-10-19 LAB — BASIC METABOLIC PANEL
Anion gap: 9 (ref 5–15)
BUN: 14 mg/dL (ref 8–23)
CO2: 30 mmol/L (ref 22–32)
Calcium: 9.2 mg/dL (ref 8.9–10.3)
Chloride: 102 mmol/L (ref 98–111)
Creatinine, Ser: 0.66 mg/dL (ref 0.61–1.24)
GFR, Estimated: >60 mL/min (ref >60)
Glucose, Bld: 95 mg/dL (ref 70–99)
Potassium: 5 mmol/L (ref 3.5–5.1)
Sodium: 141 mmol/L (ref 135–145)

## 2020-10-19 LAB — GLUCOSE, CAPILLARY
Glucose-Capillary: 116 mg/dL — ABNORMAL HIGH (ref 70–99)
Glucose-Capillary: 99 mg/dL (ref 70–99)
Glucose-Capillary: 138 mg/dL — ABNORMAL HIGH (ref 70–99)
Glucose-Capillary: 98 mg/dL (ref 70–99)
Glucose-Capillary: 141 mg/dL — ABNORMAL HIGH (ref 70–99)
Glucose-Capillary: 133 mg/dL — ABNORMAL HIGH (ref 70–99)

## 2020-10-19 LAB — MAGNESIUM
Magnesium: 2.2 mg/dL (ref 1.7–2.4)
Magnesium: 1.9 mg/dL (ref 1.7–2.4)

## 2020-10-19 LAB — PHOSPHORUS: Phosphorus: 4.3 mg/dL (ref 2.5–4.6)

## 2020-10-19 NOTE — Progress Notes (Signed)
   10/19/20 1300  Mobility  Activity Ambulated in hall  Level of Assistance Contact guard assist, steadying assist  Assistive Device Front wheel walker  Distance Ambulated (ft) 70 ft  Mobility Ambulated with assistance in hallway  Mobility Response Tolerated well  Mobility performed by Mobility specialist  $Mobility charge 1 Mobility   Upon entering room, pt was walking out of the bathroom. Pt agreeable to mobilizing. Ambulated in the hall about 24ft with RW, tolerated well. Stated he was having mid upper back "aching", but it was not due to ambulation. Pt left in bed with call bell at side. RN notified of session.   [place] Specialist Acute Rehab Services Office: [phone]

## 2020-10-19 NOTE — Progress Notes (Signed)
PROGRESS NOTE  Marcus [name]:[id number] DOB: 06/27/1945 DOA: 09/25/2020 PCP: [name], MD  HPI/Recap of past 80 hours: 75 year old male with past medical history of metastatic prostate cancer with bone metastases status post radical prostatectomy and radiation and then thoracic spine tumor with cord compression status postlaminectomy and lateral arthrodesis on 9/4 presented to the emergency room on 9/11 with back pain and black stool.  Admitted to hospitalist service and patient underwent EGD by GI which noted esophageal mass and biopsy consistent with poorly differentiated carcinoma.  Colonoscopy noted sigmoid obstructing mass with biopsy noting colon cancer.  Patient seen by surgery and underwent Hartman's procedure with end colostomy on 9/16.  Patient seen by palliative care plus oncology and radiation oncology and doing well.  Due to delays of obtaining skilled nursing facility, radiation treatment has been initiated.  Patient doing well with no complaints.  Fatigue.  Says he has been treated very well.  Assessment/Plan: Active Problems:   Prostate cancer [facility]) patient has been receiving radiation therapy while in hospital.  Started on Firmagon 240 mg by urology.   Hyponatremia: Secondary to dehydration.  Improved with IV fluids.    GI bleed   Malignant neoplasm of middle third of esophagus [facility]): New diagnosis.  Once confirmed by pathology, patient had PEG tube and tube feedings initiated after 48-hour calorie count noted nutritional deficiency.  Moderately differentiated adenocarcinoma of sigmoid colon: New diagnosis.  Status post Hartman's procedure with end colostomy and G-tube placement 9/16.  Had course of postop ileus which has since been improved.  Ostomy as per wound care.      Protein-calorie malnutrition, severe:  Nutrition Problem: Severe Malnutrition Etiology: chronic illness (cancer) Signs/Symptoms: moderate fat depletion, severe muscle depletion, percent  weight loss, energy intake < or equal to 75% for > or equal to 1 month Percent weight loss: 5.8 % Interventions: MVI, Tube feeding, Magic cup  Code Status: Full code  Family Communication: Left message for family  Disposition Plan: Needs skilled nursing.  Currently unavailable.  He has since started on radiation therapy.  This will need to be completed before he can go to skilled nursing   Consultants: Gastroenterology Radiation oncology Urology Neurosurgery Palliative care Medical oncology General surgery  Procedures: Status post Port-A-Cath placement EGD and colonoscopy Hartman's pouch procedure/colostomy G-tube placement  Antimicrobials: Preop Ancef Preop cefotetan  DVT prophylaxis: Lovenox  Level of care: Med-Surg   Objective: Vitals:   10/19/20 0330 10/19/20 1210  BP: 132/70 114/64  Pulse: 79 92  Resp: 18 20  Temp: 98.5 F (36.9 C) 98.2 F (36.8 C)  SpO2: 99% 97%    Intake/Output Summary (Last 24 hours) at 10/19/2020 1421 Last data filed at 10/18/2020 2100 Gross per 24 hour  Intake 450 ml  Output --  Net 450 ml   Filed Weights   10/18/20 0500 10/18/20 2033 10/19/20 0442  Weight: 69.4 kg 68.6 kg 68.6 kg   Body mass index is 21.7 kg/m.  Exam:  General: Alert and oriented x3, no acute distress HEENT: Normocephalic, atraumatic, mucous membranes are moist Cardiovascular: Regular rate and rhythm, S1-S2 Respiratory: Clear to auscultation bilaterally Abdomen: Soft, nontender, colostomy and feeding tube noted, hypoactive bowel sounds Musculoskeletal: No clubbing or cyanosis, trace pitting edema Skin: Port placement noted Psychiatry: Appropriate, no evidence of psychoses Neurology: No focal deficits   Data Reviewed: CBC: Recent Labs  Lab 10/16/20 0527 10/17/20 1741 10/18/20 0503  WBC 6.7 5.8 5.4  HGB 8.7* 9.0* 8.9*  HCT 27.5*  28.6* 28.3*  MCV 78.1* 79.9* 78.4*  PLT 383 397 182   Basic Metabolic Panel: Recent Labs  Lab 10/14/20 1629  10/15/20 0538 10/16/20 0527 10/17/20 0442 10/17/20 1741 10/18/20 0503 10/18/20 1551 10/19/20 0507  NA 136 136 134*  --  141 137  --   --   K 3.9 3.6 3.4*  --  4.2 4.4  --   --   CL 105 106 103  --  110 107  --   --   CO2 26 26 27   --  24 23  --   --   GLUCOSE 95 111* 102*  --  104* 111*  --   --   BUN 8 7* 7*  --  11 9  --   --   CREATININE 0.87 0.87 0.74  --  0.79 0.63  --   --   CALCIUM 8.4* 8.6* 8.4*  --  8.8* 8.9  --   --   MG  --  1.9  --  1.7  --  1.8 2.2 2.2  PHOS  --   --   --   --  3.6 3.1 3.2 4.3   GFR: Estimated Creatinine Clearance: 81 mL/min (by C-G formula based on SCr of 0.63 mg/dL). Liver Function Tests: Recent Labs  Lab 10/16/20 0527  AST 13*  ALT 10  ALKPHOS 114  BILITOT 0.4  PROT 6.2*  ALBUMIN 2.7*   No results for input(s): LIPASE, AMYLASE in the last 168 hours. No results for input(s): AMMONIA in the last 168 hours. Coagulation Profile: No results for input(s): INR, PROTIME in the last 168 hours. Cardiac Enzymes: No results for input(s): CKTOTAL, CKMB, CKMBINDEX, TROPONINI in the last 168 hours. BNP (last 3 results) No results for input(s): PROBNP in the last 8760 hours. HbA1C: No results for input(s): HGBA1C in the last 72 hours. CBG: Recent Labs  Lab 10/18/20 1621 10/18/20 2036 10/18/20 2309 10/19/20 0332 10/19/20 0824  GLUCAP 90 113* 106* 116* 99   Lipid Profile: No results for input(s): CHOL, HDL, LDLCALC, TRIG, CHOLHDL, LDLDIRECT in the last 72 hours. Thyroid Function Tests: No results for input(s): TSH, T4TOTAL, FREET4, T3FREE, THYROIDAB in the last 72 hours. Anemia Panel: No results for input(s): VITAMINB12, FOLATE, FERRITIN, TIBC, IRON, RETICCTPCT in the last 72 hours. Urine analysis:    Component Value Date/Time   COLORURINE YELLOW (A) 09/25/2020 1126   APPEARANCEUR CLOUDY (A) 09/25/2020 1126   LABSPEC 1.020 09/25/2020 1126   LABSPEC 1.010 07/09/2014 0950   PHURINE 6.0 09/25/2020 1126   GLUCOSEU NEGATIVE 09/25/2020 1126    GLUCOSEU Negative 07/09/2014 0950   HGBUR MODERATE (A) 09/25/2020 1126   BILIRUBINUR NEGATIVE 09/25/2020 1126   BILIRUBINUR Negative 07/09/2014 0950   KETONESUR TRACE (A) 09/25/2020 1126   PROTEINUR TRACE (A) 09/25/2020 1126   UROBILINOGEN 0.2 07/09/2014 0950   NITRITE POSITIVE (A) 09/25/2020 1126   LEUKOCYTESUR SMALL (A) 09/25/2020 1126   LEUKOCYTESUR Negative 07/09/2014 0950   Sepsis Labs: @LABRCNTIP (procalcitonin:4,lacticidven:4)  ) Recent Results (from the past 240 hour(s))  SARS CORONAVIRUS 2 (TAT 6-24 HRS) Nasopharyngeal Nasopharyngeal Swab     Status: None   Collection Time: 10/12/20 11:09 PM   Specimen: Nasopharyngeal Swab  Result Value Ref Range Status   SARS Coronavirus 2 NEGATIVE NEGATIVE Final    Comment: (NOTE) SARS-CoV-2 target nucleic acids are NOT DETECTED.  The SARS-CoV-2 RNA is generally detectable in upper and lower respiratory specimens during the acute phase of infection. Negative results do not preclude SARS-CoV-2  infection, do not rule out co-infections with other pathogens, and should not be used as the sole basis for treatment or other patient management decisions. Negative results must be combined with clinical observations, patient history, and epidemiological information. The expected result is Negative.  Fact Sheet for Patients: SugarRoll.be  Fact Sheet for Healthcare Providers: https://www.woods-mathews.com/  This test is not yet approved or cleared by the [place] FDA and  has been authorized for detection and/or diagnosis of SARS-CoV-2 by FDA under an Emergency Use Authorization (EUA). This EUA will remain  in effect (meaning this test can be used) for the duration of the COVID-19 declaration under Se ction 564(b)(1) of the Act, 21 U.S.C. section 360bbb-3(b)(1), unless the authorization is terminated or revoked sooner.  Performed at [facility], [place] [address], [place],  [place] [address]       Studies: No results found.  Scheduled Meds:  acetaminophen  1,000 mg Per Tube Q6H   enoxaparin (LOVENOX) injection  40 mg Subcutaneous Q24H   feeding supplement (OSMOLITE 1.5 CAL)  474 mL Per Tube TID PC   feeding supplement (PROSource TF)  45 mL Per Tube BID   free water  100 mL Per Tube TID   multivitamin with minerals  1 tablet Per Tube Daily    Continuous Infusions:   LOS: 22 days     Annita Brod, MD [facility]   10/19/2020, 2:21 PM

## 2020-10-20 ENCOUNTER — Ambulatory Visit
Admit: 2020-10-20 | Discharge: 2020-10-20 | Disposition: A | Discharge status: Home / Self Care | Payer: Medicare Other | Attending: Radiation Oncology | Admitting: Radiation Oncology

## 2020-10-20 DIAGNOSIS — K2289 Other specified disease of esophagus: Secondary | ICD-10-CM | POA: Diagnosis not present

## 2020-10-20 LAB — GLUCOSE, CAPILLARY
Glucose-Capillary: 110 mg/dL — ABNORMAL HIGH (ref 70–99)
Glucose-Capillary: 101 mg/dL — ABNORMAL HIGH (ref 70–99)
Glucose-Capillary: 169 mg/dL — ABNORMAL HIGH (ref 70–99)
Glucose-Capillary: 546 mg/dL (ref 70–99)

## 2020-10-20 NOTE — Progress Notes (Signed)
Mobility Specialist - Cancellation Note    10/20/20 1212  Mobility  Activity Off unit   Pt off unit for imaging. Will check back as schedule permits.   [place] Specialist Acute Rehabilitation Services Phone: [phone] 10/20/20, 12:12 PM

## 2020-10-20 NOTE — Progress Notes (Signed)
PROGRESS NOTE    Marcus [name]  BJS:[id number]  DOB: [birth year]  DOA: 09/25/2020 PCP: [name], MD Outpatient Specialists:   Hospital course:  75 year old man with metastatic prostate cancer, thoracic spine tumor with cord compression status postlaminectomy was admitted 09/18/2020 with GI bleed and diagnosed with poorly differentiated esophageal carcinoma as well as new diagnosis of colon cancer.  Patient was started on radiation therapy and is awaiting placement at SNF after XRT is completed.   Subjective: "I am doing okay.  Ain't braggin' but I am getting by.  They take good care of me here".   Objective: Vitals:   10/19/20 2109 10/20/20 0157 10/20/20 0318 10/20/20 1220  BP: (!) 120/54  126/74 113/68  Pulse: 87  79 86  Resp: 20  20   Temp: 98.2 F (36.8 C)  98.9 F (37.2 C) 98.1 F (36.7 C)  TempSrc: Oral  Oral Oral  SpO2: 97%  100% 100%  Weight: 67.8 kg 67.8 kg    Height:       No intake or output data in the 24 hours ending 10/20/20 1553 Filed Weights   10/19/20 0442 10/19/20 2109 10/20/20 0157  Weight: 68.6 kg 67.8 kg 67.8 kg     Exam:  General: Tired chronically ill appearing man in NAD Eyes: sclera anicteric, conjuctiva mild injection bilaterally CVS: S1-S2, regular  Respiratory:  decreased air entry bilaterally secondary to decreased inspiratory effort, rales at bases  GI: NABS, soft, NT  LE: No edema.   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)   Esophageal mass   Cancer of sigmoid colon (HCC) pending confirmatory biopsy   Goals of care, counseling/discussion   Protein-calorie malnutrition, severe  Colon cancer, new diagnosis Status post treatment with Hartman's pouch's procedure and end colostomy G-tube placed on 09/30/2020  Poorly differentiated esophageal CA, new diagnosis Nutrition per PEG tube although patient is able to eat  some  Metastatic prostate CA Getting XRT per radiation oncology Urology following and ordered Firmagon for androgen depletion therapy  Disposition Is awaiting SNFafter XRT is completed Will need follow-up with oncology, neurosurgery, urology and neurology    DVT prophylaxis: Lovenox Code Status: Full Family Communication: None Disposition Plan:   Patient is from: Home  Anticipated Discharge Location: SNF  Barriers to Discharge: Waiting completion of XRT  Is patient medically stable for Discharge: He will be after XRT is completed               Consultants:  Oncology Radiation oncology General surgery Palliative care GI Urology Neurosurgery    Procedures:  Port-A-Cath placement Feeding tube placement Colostomy Tube feeding   Scheduled Meds:  acetaminophen  1,000 mg Per Tube Q6H   enoxaparin (LOVENOX) injection  40 mg Subcutaneous Q24H   feeding supplement (OSMOLITE 1.5 CAL)  474 mL Per Tube TID PC   feeding supplement (PROSource TF)  45 mL Per Tube BID   free water  100 mL Per Tube TID   multivitamin with minerals  1 tablet Per Tube Daily   Continuous Infusions:  Data Reviewed:  Basic Metabolic Panel: Recent Labs  Lab 10/15/20 0538 10/16/20 0527 10/17/20 0442 10/17/20 1741 10/18/20 0503 10/18/20 1551 10/19/20 0507 10/19/20 1459 10/19/20 1524  NA 136 134*  --  141 137  --   --  141  --   K 3.6 3.4*  --  4.2 4.4  --   --  5.0  --   CL 106 103  --  110 107  --   --  102  --   CO2 26 27  --  24 23  --   --  30  --   GLUCOSE 111* 102*  --  104* 111*  --   --  95  --   BUN 7* 7*  --  11 9  --   --  14  --   CREATININE 0.87 0.74  --  0.79 0.63  --   --  0.66  --   CALCIUM 8.6* 8.4*  --  8.8* 8.9  --   --  9.2  --   MG 1.9  --  1.7  --  1.8 2.2 2.2  --  1.9  PHOS  --   --   --  3.6 3.1 3.2 4.3  --   --     CBC: Recent Labs  Lab 10/16/20 0527 10/17/20 1741 10/18/20 0503  WBC 6.7 5.8 5.4  HGB 8.7* 9.0* 8.9*  HCT 27.5* 28.6* 28.3*  MCV 78.1* 79.9*  78.4*  PLT 383 397 327    Studies: No results found.      Dewaine Oats Derek Jack, [facility]  If 7PM-7AM, please contact night-coverage [email or web address]   LOS: 23 days

## 2020-10-21 ENCOUNTER — Ambulatory Visit
Admit: 2020-10-21 | Discharge: 2020-10-21 | Disposition: A | Discharge status: Home / Self Care | Payer: Medicare Other | Attending: Radiation Oncology | Admitting: Radiation Oncology

## 2020-10-21 DIAGNOSIS — E43 Unspecified severe protein-calorie malnutrition: Secondary | ICD-10-CM | POA: Diagnosis not present

## 2020-10-21 DIAGNOSIS — C187 Malignant neoplasm of sigmoid colon: Secondary | ICD-10-CM | POA: Diagnosis not present

## 2020-10-21 DIAGNOSIS — C61 Malignant neoplasm of prostate: Secondary | ICD-10-CM | POA: Diagnosis not present

## 2020-10-21 DIAGNOSIS — K2289 Other specified disease of esophagus: Secondary | ICD-10-CM | POA: Diagnosis not present

## 2020-10-21 LAB — GLUCOSE, CAPILLARY
Glucose-Capillary: 140 mg/dL — ABNORMAL HIGH (ref 70–99)
Glucose-Capillary: 107 mg/dL — ABNORMAL HIGH (ref 70–99)
Glucose-Capillary: 117 mg/dL — ABNORMAL HIGH (ref 70–99)
Glucose-Capillary: 160 mg/dL — ABNORMAL HIGH (ref 70–99)
Glucose-Capillary: 87 mg/dL (ref 70–99)
Glucose-Capillary: 102 mg/dL — ABNORMAL HIGH (ref 70–99)

## 2020-10-21 LAB — BASIC METABOLIC PANEL
Anion gap: 8 (ref 5–15)
BUN: 17 mg/dL (ref 8–23)
CO2: 26 mmol/L (ref 22–32)
Calcium: 8.9 mg/dL (ref 8.9–10.3)
Chloride: 97 mmol/L — ABNORMAL LOW (ref 98–111)
Creatinine, Ser: 0.74 mg/dL (ref 0.61–1.24)
GFR, Estimated: >60 mL/min (ref >60)
Glucose, Bld: 106 mg/dL — ABNORMAL HIGH (ref 70–99)
Potassium: 3.9 mmol/L (ref 3.5–5.1)
Sodium: 131 mmol/L — ABNORMAL LOW (ref 135–145)

## 2020-10-21 NOTE — Progress Notes (Addendum)
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  Patient was started on radiation therapy and is awaiting placement for SNF after radiation treatment as he is having difficulty being placed while he is on radiation therapy.  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  Metastatic prostate cancer getting radiation therapy.  She actually had a radiation therapy today Oncology following  2.  Poorly differentiated esophageal cancer new diagnosis.  Patient started on PEG feeding  3.  Colon cancer new diagnosis.  Patient is status post Jeanette Caprice pouch procedure and queen STEMI NG tube placement of September 30, 2020  4.  Protein calorie malnutrition due to his esophageal cancer.  He has been started on PEG feeding Code Status: Full  Severity of Illness: The appropriate patient status for this patient is INPATIENT. Inpatient status is judged to be reasonable and necessary in order to provide the required intensity of service to ensure the patient's safety. The patient's presenting symptoms, physical exam findings, and initial radiographic and laboratory data in the context of their chronic comorbidities is felt to place them at high risk for further clinical  deterioration. Furthermore, it is not anticipated that the patient will be medically stable for discharge from the hospital within 2 midnights of admission. The following factors support the patient status of inpatient.   " With team for transfer to SNF  * I certify that at the point of admission it is my clinical judgment that the patient will require inpatient hospital care spanning beyond 2 midnights from the point of admission due to high intensity of service, high risk for further deterioration and high frequency of surveillance required.*   Family Communication: Discussed with patient  Disposition Plan:   Status is: Inpatient   Dispo: The patient is from: Home              Anticipated d/c is to:               Anticipated d/c date is:               Patient currently not medically stable for discharge  Consultants: Radiology oncology  Procedures: Hartmann procedure PEG placement   Antimicrobials:    DVT prophylaxis: Lovenox   Objective: Vitals:   10/20/20 1943 10/21/20 0450 10/21/20 0500 10/21/20 1307  BP: (!) 142/61 123/76  121/63  Pulse: 82 89  88  Resp: 18 14  18   Temp: 98.3 F (36.8 C) 99 F (37.2 C)  98.2 F (36.8 C)  TempSrc: Oral Oral  Oral  SpO2: 100% 96%  99%  Weight:   67.8 kg   Height:        Intake/Output Summary (Last 24 hours) at 10/21/2020 1813 Last data filed at 10/21/2020 1300 Gross per 24 hour  Intake --  Output 2 ml  Net -2 ml   Filed Weights   10/19/20 2109 10/20/20 0157 10/21/20 0500  Weight: 67.8 kg 67.8 kg 67.8 kg   Body mass index is 21.45 kg/m.  Exam:  General: 75 y.o. year-old male well developed well nourished in no acute distress.  Alert and oriented x3.  Thin looking Cardiovascular: Regular rate and rhythm with no rubs or gallops.  No thyromegaly or JVD noted.   Respiratory: Clear to auscultation with no wheezes or rales. Good inspiratory effort. Abdomen: Soft nontender nondistended with normal bowel sounds x4  quadrants.  Colostomy bag in place and functioning Musculoskeletal: No lower extremity edema. 2/4 pulses in all 4 extremities. Skin: No ulcerative lesions noted or rashes, Psychiatry: Mood is appropriate for condition and setting Neurology:    Data Reviewed: CBC: Recent Labs  Lab 10/16/20 0527 10/17/20 1741 10/18/20 0503  WBC 6.7 5.8 5.4  HGB 8.7* 9.0* 8.9*  HCT 27.5* 28.6* 28.3*  MCV 78.1* 79.9* 78.4*  PLT 383 397 768   Basic Metabolic Panel: Recent Labs  Lab 10/16/20 0527 10/17/20 0442 10/17/20 1741 10/18/20 0503 10/18/20 1551 10/19/20 0507 10/19/20 1459 10/19/20 1524 10/21/20 1536  NA 134*  --  141 137  --   --  141  --  131*  K 3.4*  --  4.2 4.4  --   --  5.0  --  3.9  CL 103  --  110 107  --   --  102  --  97*  CO2 27  --  24 23  --   --  30  --  26  GLUCOSE 102*  --  104* 111*  --   --  95  --  106*  BUN 7*  --  11 9  --   --  14  --  17  CREATININE 0.74  --  0.79 0.63  --   --  0.66  --  0.74  CALCIUM 8.4*  --  8.8* 8.9  --   --  9.2  --  8.9  MG  --  1.7  --  1.8 2.2 2.2  --  1.9  --   PHOS  --   --  3.6 3.1 3.2 4.3  --   --   --    GFR: Estimated Creatinine Clearance: 80 mL/min (by C-G formula based on SCr of 0.74 mg/dL). Liver Function Tests: Recent Labs  Lab 10/16/20 0527  AST 13*  ALT 10  ALKPHOS 114  BILITOT 0.4  PROT 6.2*  ALBUMIN 2.7*   No results for input(s): LIPASE, AMYLASE in the last 168 hours. No results for input(s): AMMONIA in the last 168 hours. Coagulation Profile: No results for input(s): INR, PROTIME in the last 168 hours. Cardiac Enzymes: No results for input(s): CKTOTAL, CKMB, CKMBINDEX, TROPONINI in the last 168 hours. BNP (last 3 results) No results for input(s): PROBNP in the last 8760 hours. HbA1C: No results for input(s): HGBA1C in the last 72 hours. CBG: Recent Labs  Lab 10/21/20 0101 10/21/20 0410 10/21/20 0811 10/21/20 1157 10/21/20 1621  GLUCAP 140* 107* 117* 160* 87   Lipid Profile: No results for  input(s): CHOL, HDL, LDLCALC, TRIG, CHOLHDL, LDLDIRECT in the last 72 hours. Thyroid Function Tests: No results for input(s): TSH, T4TOTAL, FREET4, T3FREE, THYROIDAB in the last 72 hours. Anemia Panel: No results for input(s): VITAMINB12, FOLATE, FERRITIN, TIBC, IRON, RETICCTPCT in the last 72 hours. Urine analysis:    Component Value Date/Time  COLORURINE YELLOW (A) 09/25/2020 1126   APPEARANCEUR CLOUDY (A) 09/25/2020 1126   LABSPEC 1.020 09/25/2020 1126   LABSPEC 1.010 07/09/2014 0950   PHURINE 6.0 09/25/2020 1126   GLUCOSEU NEGATIVE 09/25/2020 1126   GLUCOSEU Negative 07/09/2014 0950   HGBUR MODERATE (A) 09/25/2020 1126   BILIRUBINUR NEGATIVE 09/25/2020 1126   BILIRUBINUR Negative 07/09/2014 0950   KETONESUR TRACE (A) 09/25/2020 1126   PROTEINUR TRACE (A) 09/25/2020 1126   UROBILINOGEN 0.2 07/09/2014 0950   NITRITE POSITIVE (A) 09/25/2020 1126   LEUKOCYTESUR SMALL (A) 09/25/2020 1126   LEUKOCYTESUR Negative 07/09/2014 0950   Sepsis Labs: @LABRCNTIP (procalcitonin:4,lacticidven:4)  ) Recent Results (from the past 240 hour(s))  SARS CORONAVIRUS 2 (TAT 6-24 HRS) Nasopharyngeal Nasopharyngeal Swab     Status: None   Collection Time: 10/12/20 11:09 PM   Specimen: Nasopharyngeal Swab  Result Value Ref Range Status   SARS Coronavirus 2 NEGATIVE NEGATIVE Final    Comment: (NOTE) SARS-CoV-2 target nucleic acids are NOT DETECTED.  The SARS-CoV-2 RNA is generally detectable in upper and lower respiratory specimens during the acute phase of infection. Negative results do not preclude SARS-CoV-2 infection, do not rule out co-infections with other pathogens, and should not be used as the sole basis for treatment or other patient management decisions. Negative results must be combined with clinical observations, patient history, and epidemiological information. The expected result is Negative.  Fact Sheet for Patients: SugarRoll.be  Fact Sheet for  Healthcare Providers: https://www.woods-mathews.com/  This test is not yet approved or cleared by the [place] FDA and  has been authorized for detection and/or diagnosis of SARS-CoV-2 by FDA under an Emergency Use Authorization (EUA). This EUA will remain  in effect (meaning this test can be used) for the duration of the COVID-19 declaration under Se ction 564(b)(1) of the Act, 21 U.S.C. section 360bbb-3(b)(1), unless the authorization is terminated or revoked sooner.  Performed at [facility], [place] [address], [place], [place] [address]       Studies: No results found.  Scheduled Meds:  acetaminophen  1,000 mg Per Tube Q6H   enoxaparin (LOVENOX) injection  40 mg Subcutaneous Q24H   feeding supplement (OSMOLITE 1.5 CAL)  474 mL Per Tube TID PC   feeding supplement (PROSource TF)  45 mL Per Tube BID   free water  100 mL Per Tube TID   multivitamin with minerals  1 tablet Per Tube Daily    Continuous Infusions:   LOS: 24 days     Cristal Deer, MD [facility]  To reach me or the doctor on call, go to: [email or web address] Password [facility]  10/21/2020, 6:13 PM

## 2020-10-21 NOTE — Progress Notes (Signed)
Pt CBG 2035 [id number], was not made aware NT had to leave due to sick child, pt was asymptomatic. Once aware of his no insulin order in, rechecked before notifying provider and was in normal range. Continued to monitor q4 cbgs.

## 2020-10-21 NOTE — Progress Notes (Signed)
   10/21/20 1354  Mobility  Activity Ambulated in hall  Level of Assistance Contact guard assist, steadying assist  Assistive Device Front wheel walker  Distance Ambulated (ft) 50 ft  Mobility Ambulated with assistance in hallway  Mobility Response Tolerated well  Mobility performed by Mobility specialist  $Mobility charge 1 Mobility   Start: 12:37p End: 12:44p  Prior to entering room, NT stated pt recently was up to use bathroom. Upon entering, pt agreeable to mobilizing today. Ambulated about 21ft in the hall with RW, tolerated well. Took 1 rest break halfway through per pt request. Pt stated he was feeling tired, otherwise no complaints. Checked pt's HR prior to retuning (noted below). Rechecked vitals upon returning to pt room. Left pt in bed with call bell at side. RN notified of session.  Mobility Specialist - Progress Note Pre-mobility: HR 91, SpO2 98 % During mobility: HR 123, SpO2 96% Post-mobility: HR 110, SPO2 97%     [place] Office: [phone]

## 2020-10-22 DIAGNOSIS — E43 Unspecified severe protein-calorie malnutrition: Secondary | ICD-10-CM | POA: Diagnosis not present

## 2020-10-22 DIAGNOSIS — C187 Malignant neoplasm of sigmoid colon: Secondary | ICD-10-CM | POA: Diagnosis not present

## 2020-10-22 DIAGNOSIS — C61 Malignant neoplasm of prostate: Secondary | ICD-10-CM | POA: Diagnosis not present

## 2020-10-22 DIAGNOSIS — K2289 Other specified disease of esophagus: Secondary | ICD-10-CM | POA: Diagnosis not present

## 2020-10-22 LAB — BASIC METABOLIC PANEL
Anion gap: 6 (ref 5–15)
BUN: 17 mg/dL (ref 8–23)
CO2: 28 mmol/L (ref 22–32)
Calcium: 9.1 mg/dL (ref 8.9–10.3)
Chloride: 99 mmol/L (ref 98–111)
Creatinine, Ser: 0.81 mg/dL (ref 0.61–1.24)
GFR, Estimated: >60 mL/min (ref >60)
Glucose, Bld: 173 mg/dL — ABNORMAL HIGH (ref 70–99)
Potassium: 4.1 mmol/L (ref 3.5–5.1)
Sodium: 133 mmol/L — ABNORMAL LOW (ref 135–145)

## 2020-10-22 LAB — GLUCOSE, CAPILLARY
Glucose-Capillary: 101 mg/dL — ABNORMAL HIGH (ref 70–99)
Glucose-Capillary: 118 mg/dL — ABNORMAL HIGH (ref 70–99)
Glucose-Capillary: 146 mg/dL — ABNORMAL HIGH (ref 70–99)
Glucose-Capillary: 153 mg/dL — ABNORMAL HIGH (ref 70–99)
Glucose-Capillary: 157 mg/dL — ABNORMAL HIGH (ref 70–99)
Glucose-Capillary: 163 mg/dL — ABNORMAL HIGH (ref 70–99)
Glucose-Capillary: 172 mg/dL — ABNORMAL HIGH (ref 70–99)

## 2020-10-22 NOTE — Progress Notes (Signed)
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  Patient was started on radiation therapy and is awaiting placement for SNF after radiation treatment as he is having difficulty being placed while he is on radiation therapy.  Patient currently on tube feeding  Subjective: October 21, 2020: Patient seen and examined at bedside Denies any complain  October 22, 2020: Patient seen and examined at bedside Patient had radiation therapy yesterday.  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  Metastatic prostate cancer getting radiation therapy.  She actually had a radiation therapy today Oncology following  2.  Poorly differentiated esophageal cancer new diagnosis.  Patient started on PEG feeding  3.  Colon cancer new diagnosis.  Patient is status post Jeanette Caprice pouch procedure and queen STEMI NG tube placement of September 30, 2020  4.  Protein calorie malnutrition due to his esophageal cancer.  He has been started on PEG feeding  5.  Mild hyponatremia.  Slightly improved today to 133 we will continue to monitor patient encouraged to eat regularly Code Status: Full  Severity of Illness: The appropriate patient status for this patient is INPATIENT. Inpatient status is judged to be reasonable and necessary in order  to provide the required intensity of service to ensure the patient's safety. The patient's presenting symptoms, physical exam findings, and initial radiographic and laboratory data in the context of their chronic comorbidities is felt to place them at high risk for further clinical deterioration. Furthermore, it is not anticipated that the patient will be medically stable for discharge from the hospital within 2 midnights of admission. The following factors support the patient status of inpatient.   " Waiting for transfer to SNF  * I certify that at the point of admission it is my clinical judgment that the patient will require inpatient hospital care spanning beyond 2 midnights from the point of admission due to high intensity of service, high risk for further deterioration and high frequency of surveillance required.*   Family Communication: Discussed with patient  Disposition Plan:   Status is: Inpatient   Dispo: The patient is from: Home              Anticipated d/c is to:               Anticipated d/c date is:               Patient currently not medically stable for discharge  Consultants: Radiology oncology  Procedures: Hartmann procedure PEG placement   Antimicrobials: none   DVT prophylaxis: Lovenox   Objective: Vitals:   10/21/20 1307 10/21/20 2010 10/22/20 0442 10/22/20 0447  BP: 121/63 124/65 115/82   Pulse: 88 90 96   Resp: 18 16 18    Temp: 98.2 F (36.8 C) 98.4 F (36.9 C) 98.3 F (36.8 C)   TempSrc: Oral  Oral Oral   SpO2: 99% 100% 96%   Weight:    67.3 kg  Height:        Intake/Output Summary (Last 24 hours) at 10/22/2020 1001 Last data filed at 10/21/2020 2026 Gross per 24 hour  Intake 474 ml  Output 1 ml  Net 473 ml    Filed Weights   10/20/20 0157 10/21/20 0500 10/22/20 0447  Weight: 67.8 kg 67.8 kg 67.3 kg   Body mass index is 21.29 kg/m.  Exam:  General: 75 y.o. year-old male well developed well nourished in no acute distress.  Alert  and oriented x3.  Thin looking Cardiovascular: Regular rate and rhythm with no rubs or gallops.  No thyromegaly or JVD noted.   Respiratory: Clear to auscultation with no wheezes or rales. Good inspiratory effort. Abdomen: Soft nontender nondistended with normal bowel sounds x4 quadrants.  Colostomy bag in place and functioning Musculoskeletal: No lower extremity edema. 2/4 pulses in all 4 extremities. Skin: No ulcerative lesions noted or rashes, Psychiatry: Mood is appropriate for condition and setting Neurology:    Data Reviewed: CBC: Recent Labs  Lab 10/16/20 0527 10/17/20 1741 10/18/20 0503  WBC 6.7 5.8 5.4  HGB 8.7* 9.0* 8.9*  HCT 27.5* 28.6* 28.3*  MCV 78.1* 79.9* 78.4*  PLT 383 397 793    Basic Metabolic Panel: Recent Labs  Lab 10/16/20 0527 10/17/20 0442 10/17/20 1741 10/18/20 0503 10/18/20 1551 10/19/20 0507 10/19/20 1459 10/19/20 1524 10/21/20 1536  NA 134*  --  141 137  --   --  141  --  131*  K 3.4*  --  4.2 4.4  --   --  5.0  --  3.9  CL 103  --  110 107  --   --  102  --  97*  CO2 27  --  24 23  --   --  30  --  26  GLUCOSE 102*  --  104* 111*  --   --  95  --  106*  BUN 7*  --  11 9  --   --  14  --  17  CREATININE 0.74  --  0.79 0.63  --   --  0.66  --  0.74  CALCIUM 8.4*  --  8.8* 8.9  --   --  9.2  --  8.9  MG  --  1.7  --  1.8 2.2 2.2  --  1.9  --   PHOS  --   --  3.6 3.1 3.2 4.3  --   --   --     GFR: Estimated Creatinine Clearance: 79.5 mL/min (by C-G formula based on SCr of 0.74 mg/dL). Liver Function Tests: Recent Labs  Lab 10/16/20 0527  AST 13*  ALT 10  ALKPHOS 114  BILITOT 0.4  PROT 6.2*  ALBUMIN 2.7*    No results for input(s): LIPASE, AMYLASE in the last 168 hours. No results for input(s): AMMONIA in the last 168 hours. Coagulation Profile: No results for input(s): INR, PROTIME in the last 168 hours. Cardiac Enzymes: No results for input(s): CKTOTAL, CKMB, CKMBINDEX, TROPONINI in the last 168 hours. BNP (last 3  results) No results for input(s): PROBNP in the last 8760 hours. HbA1C: No results for input(s): HGBA1C in the last 72 hours. CBG: Recent Labs  Lab 10/21/20 1621 10/21/20 2106 10/22/20 0020 10/22/20 0321 10/22/20 0801  GLUCAP 87 102* 146* 118* 101*    Lipid Profile: No results for input(s): CHOL, HDL,  LDLCALC, TRIG, CHOLHDL, LDLDIRECT in the last 72 hours. Thyroid Function Tests: No results for input(s): TSH, T4TOTAL, FREET4, T3FREE, THYROIDAB in the last 72 hours. Anemia Panel: No results for input(s): VITAMINB12, FOLATE, FERRITIN, TIBC, IRON, RETICCTPCT in the last 72 hours. Urine analysis:    Component Value Date/Time   COLORURINE YELLOW (A) 09/25/2020 1126   APPEARANCEUR CLOUDY (A) 09/25/2020 1126   LABSPEC 1.020 09/25/2020 1126   LABSPEC 1.010 07/09/2014 0950   PHURINE 6.0 09/25/2020 1126   GLUCOSEU NEGATIVE 09/25/2020 1126   GLUCOSEU Negative 07/09/2014 0950   HGBUR MODERATE (A) 09/25/2020 1126   BILIRUBINUR NEGATIVE 09/25/2020 1126   BILIRUBINUR Negative 07/09/2014 0950   KETONESUR TRACE (A) 09/25/2020 1126   PROTEINUR TRACE (A) 09/25/2020 1126   UROBILINOGEN 0.2 07/09/2014 0950   NITRITE POSITIVE (A) 09/25/2020 1126   LEUKOCYTESUR SMALL (A) 09/25/2020 1126   LEUKOCYTESUR Negative 07/09/2014 0950   Sepsis Labs: @LABRCNTIP (procalcitonin:4,lacticidven:4)  ) Recent Results (from the past 240 hour(s))  SARS CORONAVIRUS 2 (TAT 6-24 HRS) Nasopharyngeal Nasopharyngeal Swab     Status: None   Collection Time: 10/12/20 11:09 PM   Specimen: Nasopharyngeal Swab  Result Value Ref Range Status   SARS Coronavirus 2 NEGATIVE NEGATIVE Final    Comment: (NOTE) SARS-CoV-2 target nucleic acids are NOT DETECTED.  The SARS-CoV-2 RNA is generally detectable in upper and lower respiratory specimens during the acute phase of infection. Negative results do not preclude SARS-CoV-2 infection, do not rule out co-infections with other pathogens, and should not be used as the sole  basis for treatment or other patient management decisions. Negative results must be combined with clinical observations, patient history, and epidemiological information. The expected result is Negative.  Fact Sheet for Patients: SugarRoll.be  Fact Sheet for Healthcare Providers: https://www.woods-mathews.com/  This test is not yet approved or cleared by the [place] FDA and  has been authorized for detection and/or diagnosis of SARS-CoV-2 by FDA under an Emergency Use Authorization (EUA). This EUA will remain  in effect (meaning this test can be used) for the duration of the COVID-19 declaration under Se ction 564(b)(1) of the Act, 21 U.S.C. section 360bbb-3(b)(1), unless the authorization is terminated or revoked sooner.  Performed at [facility], [place] [address] [place], [place] [address]       Studies: No results found.  Scheduled Meds:  acetaminophen  1,000 mg Per Tube Q6H   enoxaparin (LOVENOX) injection  40 mg Subcutaneous Q24H   feeding supplement (OSMOLITE 1.5 CAL)  474 mL Per Tube TID PC   feeding supplement (PROSource TF)  45 mL Per Tube BID   free water  100 mL Per Tube TID   multivitamin with minerals  1 tablet Per Tube Daily    Continuous Infusions:   LOS: 25 days     Cristal Deer, MD [facility]  To reach me or the doctor on call, go to: [email or web address] Password [email or web address]  10/22/2020, 10:01 AM

## 2020-10-22 NOTE — Progress Notes (Signed)
Mobility Specialist - Cancellation/Refusal Note    10/22/20 1318  Mobility  Activity Refused mobility   Pt refused mobility at this time due to increased pain levels and is requesting to rest today.   [place] Specialist Acute Rehabilitation Services Phone: [phone] 10/22/20, 1:19 PM

## 2020-10-22 NOTE — Plan of Care (Signed)
  Problem: Clinical Measurements: Goal: Ability to maintain clinical measurements within normal limits will improve Outcome: Progressing Goal: Will remain free from infection Outcome: Progressing Goal: Cardiovascular complication will be avoided Outcome: Progressing   Problem: Nutrition: Goal: Adequate nutrition will be maintained Outcome: Progressing   Problem: Pain Managment: Goal: General experience of comfort will improve Outcome: Progressing   Problem: Safety: Goal: Ability to remain free from injury will improve Outcome: Progressing   Problem: Skin Integrity: Goal: Risk for impaired skin integrity will decrease Outcome: Progressing   

## 2020-10-23 DIAGNOSIS — C61 Malignant neoplasm of prostate: Secondary | ICD-10-CM | POA: Diagnosis not present

## 2020-10-23 DIAGNOSIS — C187 Malignant neoplasm of sigmoid colon: Secondary | ICD-10-CM | POA: Diagnosis not present

## 2020-10-23 DIAGNOSIS — K2289 Other specified disease of esophagus: Secondary | ICD-10-CM | POA: Diagnosis not present

## 2020-10-23 DIAGNOSIS — E43 Unspecified severe protein-calorie malnutrition: Secondary | ICD-10-CM | POA: Diagnosis not present

## 2020-10-23 LAB — BASIC METABOLIC PANEL
Anion gap: 6 (ref 5–15)
BUN: 18 mg/dL (ref 8–23)
CO2: 27 mmol/L (ref 22–32)
Calcium: 9 mg/dL (ref 8.9–10.3)
Chloride: 99 mmol/L (ref 98–111)
Creatinine, Ser: 0.71 mg/dL (ref 0.61–1.24)
GFR, Estimated: >60 mL/min (ref >60)
Glucose, Bld: 145 mg/dL — ABNORMAL HIGH (ref 70–99)
Potassium: 4.1 mmol/L (ref 3.5–5.1)
Sodium: 132 mmol/L — ABNORMAL LOW (ref 135–145)

## 2020-10-23 LAB — GLUCOSE, CAPILLARY
Glucose-Capillary: 122 mg/dL — ABNORMAL HIGH (ref 70–99)
Glucose-Capillary: 113 mg/dL — ABNORMAL HIGH (ref 70–99)
Glucose-Capillary: 89 mg/dL (ref 70–99)
Glucose-Capillary: 126 mg/dL — ABNORMAL HIGH (ref 70–99)
Glucose-Capillary: 160 mg/dL — ABNORMAL HIGH (ref 70–99)

## 2020-10-23 NOTE — Progress Notes (Signed)
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  Patient was started on radiation therapy and is awaiting placement for SNF after radiation treatment as he is having difficulty being placed while he is on radiation therapy.  Patient currently on tube feeding  Subjective: October 21, 2020: Patient seen and examined at bedside Denies any complain  October 22, 2020: Patient seen and examined at bedside Patient had radiation therapy yesterday.  He stated everything went well denies any complain except for his pain  October 23, 2020: Patient seen and examined at bedside he denies any new complaints he is doing well eating well  Assessment/Plan: Active Problems:   Prostate cancer ([place])   Hyponatremia   GI bleed   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[place])   Esophageal mass   Cancer of sigmoid colon ([place]) pending confirmatory biopsy   Goals of care, counseling/discussion   Protein-calorie malnutrition, severe  1.  Metastatic prostate cancer getting radiation therapy.  She actually had a radiation therapy today Oncology following  2.  Poorly differentiated esophageal cancer new diagnosis.  Patient started on PEG feeding  3.  Colon cancer new diagnosis.  Patient is status post Jeanette Caprice pouch procedure and queen STEMI NG tube placement of September 30, 2020  4.  Protein calorie malnutrition due to his esophageal cancer.  He has been started on PEG feeding  5.  Mild hyponatremia.  Slightly improved today to 133 we will continue to monitor patient encouraged to eat regularly Code Status: Full  Severity of Illness: The  appropriate patient status for this patient is INPATIENT. Inpatient status is judged to be reasonable and necessary in order to provide the required intensity of service to ensure the patient's safety. The patient's presenting symptoms, physical exam findings, and initial radiographic and laboratory data in the context of their chronic comorbidities is felt to place them at high risk for further clinical deterioration. Furthermore, it is not anticipated that the patient will be medically stable for discharge from the hospital within 2 midnights of admission. The following factors support the patient status of inpatient.   " Waiting for transfer to SNF  * I certify that at the point of admission it is my clinical judgment that the patient will require inpatient hospital care spanning beyond 2 midnights from the point of admission due to high intensity of service, high risk for further deterioration and high frequency of surveillance required.*   Family Communication: Discussed with patient  Disposition Plan:   Status is: Inpatient   Dispo: The patient is from: Home              Anticipated d/c is to:               Anticipated d/c date is:               Patient currently not medically stable for discharge  Consultants: Radiology oncology  Procedures: Hartmann procedure PEG placement   Antimicrobials: none   DVT prophylaxis: Lovenox   Objective: Vitals:   10/23/20 0402 10/23/20 0439 10/23/20 1215 10/23/20 1359  BP: 125/69   (!) 119/55  Pulse: 88   (!) 102  Resp: 16   17  Temp: 98.8 F (37.1 C)   98.3 F (36.8 C)  TempSrc: Oral     SpO2: 99%  99% 98%  Weight:  67.5 kg    Height:       No intake or output data in the 24 hours ending 10/23/20 1716  Filed Weights   10/21/20 0500 10/22/20 0447 10/23/20 0439  Weight: 67.8 kg 67.3 kg 67.5 kg   Body mass index is 21.35 kg/m.  Exam:  General: 75 y.o. year-old male well developed well nourished in no acute distress.  Alert  and oriented x3.  Thin looking Cardiovascular: Regular rate and rhythm with no rubs or gallops.  No thyromegaly or JVD noted.   Respiratory: Clear to auscultation with no wheezes or rales. Good inspiratory effort. Abdomen: Soft nontender nondistended with normal bowel sounds x4 quadrants.  Colostomy bag in place and functioning Musculoskeletal: No lower extremity edema. 2/4 pulses in all 4 extremities. Skin: No ulcerative lesions noted or rashes, Psychiatry: Mood is appropriate for condition and setting Neurology:    Data Reviewed: CBC: Recent Labs  Lab 10/17/20 1741 10/18/20 0503  WBC 5.8 5.4  HGB 9.0* 8.9*  HCT 28.6* 28.3*  MCV 79.9* 78.4*  PLT 397 993    Basic Metabolic Panel: Recent Labs  Lab 10/17/20 0442 10/17/20 1741 10/18/20 0503 10/18/20 1551 10/19/20 0507 10/19/20 1459 10/19/20 1524 10/21/20 1536 10/22/20 1138  NA  --  141 137  --   --  141  --  131* 133*  K  --  4.2 4.4  --   --  5.0  --  3.9 4.1  CL  --  110 107  --   --  102  --  97* 99  CO2  --  24 23  --   --  30  --  26 28  GLUCOSE  --  104* 111*  --   --  95  --  106* 173*  BUN  --  11 9  --   --  14  --  17 17  CREATININE  --  0.79 0.63  --   --  0.66  --  0.74 0.81  CALCIUM  --  8.8* 8.9  --   --  9.2  --  8.9 9.1  MG 1.7  --  1.8 2.2 2.2  --  1.9  --   --   PHOS  --  3.6 3.1 3.2 4.3  --   --   --   --     GFR: Estimated Creatinine Clearance: 78.7 mL/min (by C-G formula based on SCr of 0.81 mg/dL). Liver Function Tests: No results for input(s): AST, ALT, ALKPHOS, BILITOT, PROT, ALBUMIN in the last 168 hours.  No results for input(s): LIPASE, AMYLASE in the last 168 hours. No results for input(s): AMMONIA in the last 168 hours. Coagulation Profile: No results for input(s): INR, PROTIME in the last 168 hours. Cardiac Enzymes: No results for input(s): CKTOTAL, CKMB, CKMBINDEX, TROPONINI in the last 168 hours. BNP (last 3 results) No results for input(s): PROBNP in the last 8760  hours. HbA1C: No results for input(s): HGBA1C in the last 72 hours. CBG: Recent Labs  Lab 10/22/20 2349 10/23/20 0359 10/23/20 0756 10/23/20 1145 10/23/20 1608  GLUCAP 163* 122* 113* 89 126*    Lipid Profile: No results for input(s): CHOL, HDL, LDLCALC, TRIG, CHOLHDL, LDLDIRECT in the last 72 hours. Thyroid Function Tests: No results for input(s): TSH, T4TOTAL, FREET4,  T3FREE, THYROIDAB in the last 72 hours. Anemia Panel: No results for input(s): VITAMINB12, FOLATE, FERRITIN, TIBC, IRON, RETICCTPCT in the last 72 hours. Urine analysis:    Component Value Date/Time   COLORURINE YELLOW (A) 09/25/2020 1126   APPEARANCEUR CLOUDY (A) 09/25/2020 1126   LABSPEC 1.020 09/25/2020 1126   LABSPEC 1.010 07/09/2014 0950   PHURINE 6.0 09/25/2020 1126   GLUCOSEU NEGATIVE 09/25/2020 1126   GLUCOSEU Negative 07/09/2014 0950   HGBUR MODERATE (A) 09/25/2020 1126   [place] 09/25/2020 1126   BILIRUBINUR Negative 07/09/2014 0950   KETONESUR TRACE (A) 09/25/2020 1126   PROTEINUR TRACE (A) 09/25/2020 1126   UROBILINOGEN 0.2 07/09/2014 0950   NITRITE POSITIVE (A) 09/25/2020 1126   LEUKOCYTESUR SMALL (A) 09/25/2020 1126   LEUKOCYTESUR Negative 07/09/2014 0950   Sepsis Labs: @LABRCNTIP (procalcitonin:4,lacticidven:4)  ) No results found for this or any previous visit (from the past 240 hour(s)).     Studies: No results found.  Scheduled Meds:  acetaminophen  1,000 mg Per Tube Q6H   enoxaparin (LOVENOX) injection  40 mg Subcutaneous Q24H   feeding supplement (OSMOLITE 1.5 CAL)  474 mL Per Tube TID PC   feeding supplement (PROSource TF)  45 mL Per Tube BID   free water  100 mL Per Tube TID   multivitamin with minerals  1 tablet Per Tube Daily    Continuous Infusions:   LOS: 26 days     Cristal Deer, MD [facility]  To reach me or the doctor on call, go to: [email or web address] Password [email or web address]  10/23/2020, 5:16 PM

## 2020-10-23 NOTE — Progress Notes (Signed)
   10/23/20 1600  Mobility  Activity Transferred:  Bed to chair  Assistive Device None  Mobility Sit up in bed/chair position for meals  Mobility Response Tolerated well  Mobility performed by Mobility specialist  $Mobility charge 1 Mobility    Pt reluctant to mobilize today, stated he was more tired than usual. Agreed to sit up in the chair. Pt needed contact guard to get from the bed to chair. Left pt in chair with call bell at side. Notified RN of session.    [place] Specialist Acute Rehab Services Office: [phone]

## 2020-10-24 ENCOUNTER — Ambulatory Visit
Admit: 2020-10-24 | Discharge: 2020-10-24 | Disposition: A | Discharge status: Home / Self Care | Payer: Medicare Other | Attending: Radiation Oncology | Admitting: Radiation Oncology

## 2020-10-24 DIAGNOSIS — K2289 Other specified disease of esophagus: Secondary | ICD-10-CM | POA: Diagnosis not present

## 2020-10-24 DIAGNOSIS — C61 Malignant neoplasm of prostate: Secondary | ICD-10-CM | POA: Diagnosis not present

## 2020-10-24 DIAGNOSIS — E43 Unspecified severe protein-calorie malnutrition: Secondary | ICD-10-CM | POA: Diagnosis not present

## 2020-10-24 DIAGNOSIS — C187 Malignant neoplasm of sigmoid colon: Secondary | ICD-10-CM | POA: Diagnosis not present

## 2020-10-24 LAB — GLUCOSE, CAPILLARY
Glucose-Capillary: 108 mg/dL — ABNORMAL HIGH (ref 70–99)
Glucose-Capillary: 129 mg/dL — ABNORMAL HIGH (ref 70–99)
Glucose-Capillary: 133 mg/dL — ABNORMAL HIGH (ref 70–99)
Glucose-Capillary: 145 mg/dL — ABNORMAL HIGH (ref 70–99)
Glucose-Capillary: 185 mg/dL — ABNORMAL HIGH (ref 70–99)
Glucose-Capillary: 205 mg/dL — ABNORMAL HIGH (ref 70–99)
Glucose-Capillary: 96 mg/dL (ref 70–99)

## 2020-10-24 LAB — CBC
HCT: 29.5 % — ABNORMAL LOW (ref 39.0–52.0)
Hemoglobin: 9.5 g/dL — ABNORMAL LOW (ref 13.0–17.0)
MCH: 25.8 pg — ABNORMAL LOW (ref 26.0–34.0)
MCHC: 32.2 g/dL (ref 30.0–36.0)
MCV: 80.2 fL (ref 80.0–100.0)
Platelets: 273 10*3/uL (ref 150–400)
RBC: 3.68 MIL/uL — ABNORMAL LOW (ref 4.22–5.81)
RDW: 19.7 % — ABNORMAL HIGH (ref 11.5–15.5)
WBC: 4.4 10*3/uL (ref 4.0–10.5)
nRBC: 0 % (ref 0.0–0.2)

## 2020-10-24 MED ORDER — DRONABINOL 2.5 MG PO CAPS: 2.5000 mg | ORAL_CAPSULE | Freq: Every day | ORAL | Status: DC

## 2020-10-24 NOTE — Progress Notes (Signed)
Nutrition Follow-up  DOCUMENTATION CODES:   Severe malnutrition in context of chronic illness  INTERVENTION:   Continue Bolus TF via PEG: - 2 cartons (439ml) of Osmolite 1.5 - TID - 45 mL ProSource TF - BID - 50 mL water flush before and after each bolus - 200 mL water flushes - TID between boluses   Provides 2216 kcal, 111 gm of PRO, 1086 mL of free water (1986 mL total water including flushes)  -Multivitamin with minerals daily  NUTRITION DIAGNOSIS:   Severe Malnutrition related to chronic illness (cancer) as evidenced by moderate fat depletion, severe muscle depletion, percent weight loss, energy intake < or equal to 75% for > or equal to 1 month.  Ongoing.  GOAL:   Patient will meet greater than or equal to 90% of their needs  Meeting with TF  MONITOR:   PO intake, TF tolerance, Weight trends, Labs  ASSESSMENT:   75 y.o. 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 Arnoldo Morale on 9/4 and anemia returning with upper back pain, decreased appetite and black stool.  9/4: s/p laminectomy for thoracic tumor  9/11: admitted 9/12: s/p EGD 9/14: s/p flex sigmoidoscopy 9/16: s/p lap assisted Hartman's procedure with end colostomy, PEG tube placement 9/17: FLD 9/18: NPO 9/21: CLD 9/22: FLD 9/23: NPO 9/25: CLD 9/26: FLD 9/27: Regular diet   Pt continues to receive bolus feeds of Osmolite 1.5 and Prosource TF.  Per nursing reports, pt only consuming ~10% of meals. Poor appetite but TF is meeting 100% of needs. Plan is for pt to go to rehab at the end of this week per TOC note.   Admission weight: 155 lbs. Current weight: 143 lbs. -lowest weight of admission  Medications: Multivitamin with minerals daily  Labs reviewed: CBGs: 108-185 Low Na  Diet Order:   Diet Order             Diet  regular Room service appropriate? Yes; Fluid consistency: Thin  Diet effective now                   EDUCATION NEEDS:   No education needs have been identified at this time  Skin:  Skin Assessment: Skin Integrity Issues: Skin Integrity Issues:: Incisions Incisions: 9/11 vertebral column, 9/16 abdomen  Last BM:  10/10 colostomy  Height:   Ht Readings from Last 1 Encounters:  10/24/20 5\' 10"  (1.778 m)    Weight:   Wt Readings from Last 1 Encounters:  10/24/20 64.9 kg    Ideal Body Weight:  75.5 kg  BMI:  Body mass index is 20.53 kg/m.  Estimated Nutritional Needs:   Kcal:  2100-2300  Protein:  105-120 grams  Fluid:  >/= 2.1 L  Clayton Bibles, MS, RD, LDN Inpatient Clinical Dietitian Contact information available via Amion

## 2020-10-24 NOTE — TOC Progression Note (Signed)
Transition of Care [facility]) - Progression Note    Patient Details  Name: Marcus [name] MRN: [id number] Date of Birth: 02-Apr-1945  Transition of Care [facility]) CM/SW [place], [place] Phone Number: 10/24/2020, 1:20 PM  Clinical Narrative:   Met with patient to confirm plan for post radiation.  He is discouraged because he has no appetite and is increasingly weak.  He wanted to make sure that I saw [facility] POA paperwork, which I did, and reached out to chaplain to ask for help.  [name] confirmed that he still plans to go to rehab at end of week. We will make sure he has a bed and insurance authorization for Friday. TOC will continue to follow during the course of hospitalization.     Expected Discharge Plan: Skilled Nursing Facility Barriers to Discharge: SNF Pending bed offer  Expected Discharge Plan and Services Expected Discharge Plan: [place]                                               Social Determinants of Health (SDOH) Interventions    Readmission Risk Interventions Readmission Risk Prevention Plan 09/30/2020  Transportation Screening Complete  PCP or Specialist Appt within 5-7 Days Complete  Home Care Screening Complete  Medication Review (RN CM) Complete  Some recent data might be hidden

## 2020-10-24 NOTE — Progress Notes (Addendum)
PROGRESS NOTE  Marcus [name] PFX:[id number] DOB: [birth year] DOA: 09/25/2020 PCP: [name], MD  HPI/Recap of past 24 hours:   75 year old male with metastatic prostate cancer, thoracic spine tumor with cord compression status post laminectomy who was admitted September 18, 2020 with GI bleed and diagnosed with poorly defeated esophageal carcinoma as well as new diagnosis of colon cancer.  Patient was started on radiation therapy and is awaiting placement for SNF after radiation treatment as he is having difficulty being placed while he is on radiation therapy.  Patient currently on tube feeding  Subjective: October 21, 2020: Patient seen and examined at bedside Denies any complain  October 22, 2020: Patient seen and examined at bedside Patient had radiation therapy yesterday.  He stated everything went well denies any complain except for his pain  October 23, 2020: Patient seen and examined at bedside he denies any new complaints he is doing well eating well  October 24, 2020: Patient seen and examined at bedside he is complaining of poor appetite he is getting tube feeding through his gastrostomy tube he also gets a tree but nurse reported he only eats about 10%.  He has radiation therapy today  Assessment/Plan: Active Problems:   Prostate cancer ([facility])   Hyponatremia   GI bleed   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[facility])   Esophageal mass   Cancer of sigmoid colon ([place]) pending confirmatory biopsy   Goals of care, counseling/discussion   Protein-calorie malnutrition, severe  1.  Metastatic prostate cancer getting radiation therapy.  She actually had a radiation therapy today Oncology following  2.  Poorly differentiated esophageal cancer new diagnosis.  Patient started on PEG feeding  3.  Colon cancer new diagnosis.  Patient is status post Jeanette Caprice pouch procedure and queen STEMI NG tube placement of September 30, 2020  4.  Protein calorie malnutrition  due to his esophageal cancer.  He has been started on PEG feeding. He is complaining of poor appetite I will start him on Marinol but patient declined it and so I will discontinue it  5.  Mild hyponatremia.  Slightly improved today to 133 we will continue to monitor patient encouraged to eat regularly Code Status: Full  Severity of Illness: The appropriate patient status for this patient is INPATIENT. Inpatient status is judged to be reasonable and necessary in order to provide the required intensity of service to ensure the patient's safety. The patient's presenting symptoms, physical exam findings, and initial radiographic and laboratory data in the context of their chronic comorbidities is felt to place them at high risk for further clinical deterioration. Furthermore, it is not anticipated that the patient will be medically stable for discharge from the hospital within 2 midnights of admission. The following factors support the patient status of inpatient.   " Waiting for transfer to SNF  * I certify that at the point of admission it is my clinical judgment that the patient will require inpatient hospital care spanning beyond 2 midnights from the point of admission due to high intensity of service, high risk for further deterioration and high frequency of surveillance required.*   Family Communication: Discussed with patient  Disposition Plan:   Status is: Inpatient   Dispo: The patient is from: Home              Anticipated d/c is to: SNF/REHAB              Anticipated d/c date is: BY WEEKEND  Patient currently not medically stable for discharge  Consultants: Radiology oncology  Procedures: Hartmann procedure PEG placement   Antimicrobials: none   DVT prophylaxis: Lovenox   Objective: Vitals:   10/23/20 2007 10/24/20 0402 10/24/20 0524 10/24/20 1416  BP: 133/70  125/77 138/77  Pulse: 97  95 96  Resp: 16  18 18   Temp: 98.6 F (37 C)  99.5 F (37.5 C)  97.7 F (36.5 C)  TempSrc: Oral  Oral Oral  SpO2: 99%  98% 100%  Weight:  64.9 kg    Height:  5\' 10"  (1.778 m)     No intake or output data in the 24 hours ending 10/24/20 1551  Filed Weights   10/22/20 0447 10/23/20 0439 10/24/20 0402  Weight: 67.3 kg 67.5 kg 64.9 kg   Body mass index is 20.53 kg/m.  Exam:  General: 75 y.o. year-old male well developed well nourished in no acute distress.  Alert and oriented x3.  Thin looking Cardiovascular: Regular rate and rhythm with no rubs or gallops.  No thyromegaly or JVD noted.   Respiratory: Clear to auscultation with no wheezes or rales. Good inspiratory effort. Abdomen: Soft nontender nondistended with normal bowel sounds x4 quadrants.  Colostomy bag in place and functioning Musculoskeletal: No lower extremity edema. 2/4 pulses in all 4 extremities. Skin: No ulcerative lesions noted or rashes, Psychiatry: Mood is appropriate for condition and setting Neurology:    Data Reviewed: CBC: Recent Labs  Lab 10/17/20 1741 10/18/20 0503  WBC 5.8 5.4  HGB 9.0* 8.9*  HCT 28.6* 28.3*  MCV 79.9* 78.4*  PLT 397 867    Basic Metabolic Panel: Recent Labs  Lab 10/17/20 1741 10/18/20 0503 10/18/20 1551 10/19/20 0507 10/19/20 1459 10/19/20 1524 10/21/20 1536 10/22/20 1138 10/23/20 1829  NA 141 137  --   --  141  --  131* 133* 132*  K 4.2 4.4  --   --  5.0  --  3.9 4.1 4.1  CL 110 107  --   --  102  --  97* 99 99  CO2 24 23  --   --  30  --  26 28 27   GLUCOSE 104* 111*  --   --  95  --  106* 173* 145*  BUN 11 9  --   --  14  --  17 17 18   CREATININE 0.79 0.63  --   --  0.66  --  0.74 0.81 0.71  CALCIUM 8.8* 8.9  --   --  9.2  --  8.9 9.1 9.0  MG  --  1.8 2.2 2.2  --  1.9  --   --   --   PHOS 3.6 3.1 3.2 4.3  --   --   --   --   --     GFR: Estimated Creatinine Clearance: 76.6 mL/min (by C-G formula based on SCr of 0.71 mg/dL). Liver Function Tests: No results for input(s): AST, ALT, ALKPHOS, BILITOT, PROT, ALBUMIN in the  last 168 hours.  No results for input(s): LIPASE, AMYLASE in the last 168 hours. No results for input(s): AMMONIA in the last 168 hours. Coagulation Profile: No results for input(s): INR, PROTIME in the last 168 hours. Cardiac Enzymes: No results for input(s): CKTOTAL, CKMB, CKMBINDEX, TROPONINI in the last 168 hours. BNP (last 3 results) No results for input(s): PROBNP in the last 8760 hours. HbA1C: No results for input(s): HGBA1C in the last 72 hours. CBG: Recent Labs  Lab 10/23/20 2003  10/24/20 0018 10/24/20 0400 10/24/20 0740 10/24/20 1151  GLUCAP 160* 185* 129* 108* 145*    Lipid Profile: No results for input(s): CHOL, HDL, LDLCALC, TRIG, CHOLHDL, LDLDIRECT in the last 72 hours. Thyroid Function Tests: No results for input(s): TSH, T4TOTAL, FREET4, T3FREE, THYROIDAB in the last 72 hours. Anemia Panel: No results for input(s): VITAMINB12, FOLATE, FERRITIN, TIBC, IRON, RETICCTPCT in the last 72 hours. Urine analysis:    Component Value Date/Time   COLORURINE YELLOW (A) 09/25/2020 1126   APPEARANCEUR CLOUDY (A) 09/25/2020 1126   LABSPEC 1.020 09/25/2020 1126   LABSPEC 1.010 07/09/2014 0950   PHURINE 6.0 09/25/2020 1126   GLUCOSEU NEGATIVE 09/25/2020 1126   GLUCOSEU Negative 07/09/2014 0950   HGBUR MODERATE (A) 09/25/2020 1126   [place] 09/25/2020 1126   BILIRUBINUR Negative 07/09/2014 0950   KETONESUR TRACE (A) 09/25/2020 1126   PROTEINUR TRACE (A) 09/25/2020 1126   UROBILINOGEN 0.2 07/09/2014 0950   NITRITE POSITIVE (A) 09/25/2020 1126   LEUKOCYTESUR SMALL (A) 09/25/2020 1126   LEUKOCYTESUR Negative 07/09/2014 0950   Sepsis Labs: @LABRCNTIP (procalcitonin:4,lacticidven:4)  ) No results found for this or any previous visit (from the past 240 hour(s)).     Studies: No results found.  Scheduled Meds:  acetaminophen  1,000 mg Per Tube Q6H   enoxaparin (LOVENOX) injection  40 mg Subcutaneous Q24H   feeding supplement (OSMOLITE 1.5 CAL)  474 mL  Per Tube TID PC   feeding supplement (PROSource TF)  45 mL Per Tube BID   free water  100 mL Per Tube TID   multivitamin with minerals  1 tablet Per Tube Daily    Continuous Infusions:   LOS: 27 days     Cristal Deer, MD [facility]  To reach me or the doctor on call, go to: [email or web address] Password [email or web address]  10/24/2020, 3:51 PM

## 2020-10-24 NOTE — Progress Notes (Signed)
Pt declined bath when offered by nurse tech Kennyth Lose.

## 2020-10-24 NOTE — Progress Notes (Signed)
   10/24/20 1200  Mobility  Activity Refused mobility   Pt refused mobility today secondary to up coming radiation appt at 1p. Stated he would like to rest.    [place] Office: [phone]

## 2020-10-25 ENCOUNTER — Telehealth: Payer: Self-pay | Admitting: Hematology

## 2020-10-25 ENCOUNTER — Ambulatory Visit
Admit: 2020-10-25 | Discharge: 2020-10-25 | Disposition: A | Discharge status: Home / Self Care | Payer: Medicare Other | Attending: Radiation Oncology | Admitting: Radiation Oncology

## 2020-10-25 DIAGNOSIS — E43 Unspecified severe protein-calorie malnutrition: Secondary | ICD-10-CM | POA: Diagnosis not present

## 2020-10-25 DIAGNOSIS — C61 Malignant neoplasm of prostate: Secondary | ICD-10-CM | POA: Diagnosis not present

## 2020-10-25 DIAGNOSIS — C187 Malignant neoplasm of sigmoid colon: Secondary | ICD-10-CM | POA: Diagnosis not present

## 2020-10-25 DIAGNOSIS — K2289 Other specified disease of esophagus: Secondary | ICD-10-CM | POA: Diagnosis not present

## 2020-10-25 LAB — GLUCOSE, CAPILLARY
Glucose-Capillary: 118 mg/dL — ABNORMAL HIGH (ref 70–99)
Glucose-Capillary: 127 mg/dL — ABNORMAL HIGH (ref 70–99)
Glucose-Capillary: 141 mg/dL — ABNORMAL HIGH (ref 70–99)
Glucose-Capillary: 142 mg/dL — ABNORMAL HIGH (ref 70–99)
Glucose-Capillary: 155 mg/dL — ABNORMAL HIGH (ref 70–99)
Glucose-Capillary: 209 mg/dL — ABNORMAL HIGH (ref 70–99)

## 2020-10-25 LAB — CREATININE, SERUM
Creatinine, Ser: 0.6 mg/dL — ABNORMAL LOW (ref 0.61–1.24)
GFR, Estimated: >60 mL/min (ref >60)

## 2020-10-25 MED ORDER — ALUM & MAG HYDROXIDE-SIMETH 200-200-20 MG/5ML PO SUSP
30.0000 mL | Freq: Four times a day (QID) | ORAL | Status: DC | PRN
  Administered 2020-10-25: 30 mL
  Filled 2020-10-25: qty 30

## 2020-10-25 NOTE — Progress Notes (Signed)
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  Patient was started on radiation therapy and is awaiting placement for SNF after radiation treatment as he is having difficulty being placed while he is on radiation therapy.  Patient currently on tube feeding  Subjective: October 21, 2020: Patient seen and examined at bedside Denies any complain  October 22, 2020: Patient seen and examined at bedside Patient had radiation therapy yesterday.  He stated everything went well denies any complain except for his pain  October 23, 2020: Patient seen and examined at bedside he denies any new complaints he is doing well eating well  October 24, 2020: Patient seen and examined at bedside he is complaining of poor appetite he is getting tube feeding through his gastrostomy tube he also gets a tree but nurse reported he only eats about 10%.  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  Metastatic prostate cancer getting radiation therapy.  She actually had a radiation therapy today Oncology following  2.  Poorly differentiated esophageal cancer new diagnosis.  Patient started on PEG feeding  3.  Colon cancer new diagnosis.  Patient is status post Jeanette Caprice pouch procedure and queen STEMI NG tube placement of September 30, 2020  4.  Protein calorie malnutrition  due to his esophageal cancer.  He has been started on PEG feeding. He is complaining of poor appetite I will start him on Marinol but patient declined it and so I will discontinue it  5.  Mild hyponatremia.  Slightly improved today to 133 we will continue to monitor patient encouraged to eat regularly Code Status: Full  Severity of Illness: The appropriate patient status for this patient is INPATIENT. Inpatient status is judged to be reasonable and necessary in order to provide the required intensity of service to ensure the patient's safety. The patient's presenting symptoms, physical exam findings, and initial radiographic and laboratory data in the context of their chronic comorbidities is felt to place them at high risk for further clinical deterioration. Furthermore, it is not anticipated that the patient will be medically stable for discharge from the hospital within 2 midnights of admission. The following factors support the patient status of inpatient.   " Waiting for transfer to SNF  * I certify that at the point of admission it is my clinical judgment that the patient will require inpatient hospital care spanning beyond 2 midnights from the point of admission due to high intensity of service, high risk for further deterioration and high frequency of surveillance required.*   Family Communication: Discussed with patient  Disposition Plan:   Status is: Inpatient   Dispo: The patient is from: Home              Anticipated d/c is to: SNF/REHAB              Anticipated d/c date is: BY WEEKEND  Patient currently not medically stable for discharge  Consultants: Radiology oncology  Procedures: Hartmann procedure PEG placement   Antimicrobials: none   DVT prophylaxis: Lovenox   Objective: Vitals:   10/24/20 1416 10/24/20 2043 10/24/20 2051 10/25/20 0305  BP: 138/77 137/65  134/69  Pulse: 96 87  92  Resp: 18 18  18   Temp: 97.7 F (36.5 C) 98.6 F (37 C)   98.4 F (36.9 C)  TempSrc: Oral Oral  Oral  SpO2: 100% 98%  100%  Weight:   64.9 kg   Height:       No intake or output data in the 24 hours ending 10/25/20 0920  Filed Weights   10/23/20 0439 10/24/20 0402 10/24/20 2051  Weight: 67.5 kg 64.9 kg 64.9 kg   Body mass index is 20.53 kg/m.  Exam:  General: 75 y.o. year-old male well developed well nourished in no acute distress.  Alert and oriented x3.  Thin looking Cardiovascular: Regular rate and rhythm with no rubs or gallops.  No thyromegaly or JVD noted.   Respiratory: Clear to auscultation with no wheezes or rales. Good inspiratory effort. Abdomen: Soft nontender nondistended with normal bowel sounds x4 quadrants.  Colostomy bag in place and functioning Musculoskeletal: No lower extremity edema. 2/4 pulses in all 4 extremities. Skin: No ulcerative lesions noted or rashes, Psychiatry: Mood is appropriate for condition and setting Neurology:    Data Reviewed: CBC: Recent Labs  Lab 10/24/20 1605  WBC 4.4  HGB 9.5*  HCT 29.5*  MCV 80.2  PLT 322    Basic Metabolic Panel: Recent Labs  Lab 10/18/20 1551 10/19/20 0507 10/19/20 1459 10/19/20 1524 10/21/20 1536 10/22/20 1138 10/23/20 1829 10/25/20 0440  NA  --   --  141  --  131* 133* 132*  --   K  --   --  5.0  --  3.9 4.1 4.1  --   CL  --   --  102  --  97* 99 99  --   CO2  --   --  30  --  26 28 27   --   GLUCOSE  --   --  95  --  106* 173* 145*  --   BUN  --   --  14  --  17 17 18   --   CREATININE  --   --  0.66  --  0.74 0.81 0.71 0.60*  CALCIUM  --   --  9.2  --  8.9 9.1 9.0  --   MG 2.2 2.2  --  1.9  --   --   --   --   PHOS 3.2 4.3  --   --   --   --   --   --     GFR: Estimated Creatinine Clearance: 76.6 mL/min (A) (by C-G formula based on SCr of 0.6 mg/dL (L)). Liver Function Tests: No results for input(s): AST, ALT, ALKPHOS, BILITOT, PROT, ALBUMIN in the last 168 hours.  No results for input(s): LIPASE, AMYLASE in the last 168 hours. No results  for input(s): AMMONIA in the last 168 hours. Coagulation Profile: No results for input(s): INR, PROTIME in the last 168 hours. Cardiac Enzymes: No results for input(s): CKTOTAL, CKMB, CKMBINDEX, TROPONINI in the last 168 hours. BNP (last 3 results) No results for input(s): PROBNP in the last 8760 hours. HbA1C: No results for input(s): HGBA1C in the last 72 hours. CBG: Recent Labs  Lab 10/24/20 1640 10/24/20 2040 10/24/20 2304  10/25/20 0309 10/25/20 0822  GLUCAP 96 133* 205* 127* 118*    Lipid Profile: No results for input(s): CHOL, HDL, LDLCALC, TRIG, CHOLHDL, LDLDIRECT in the last 72 hours. Thyroid Function Tests: No results for input(s): TSH, T4TOTAL, FREET4, T3FREE, THYROIDAB in the last 72 hours. Anemia Panel: No results for input(s): VITAMINB12, FOLATE, FERRITIN, TIBC, IRON, RETICCTPCT in the last 72 hours. Urine analysis:    Component Value Date/Time   COLORURINE YELLOW (A) 09/25/2020 1126   APPEARANCEUR CLOUDY (A) 09/25/2020 1126   LABSPEC 1.020 09/25/2020 1126   LABSPEC 1.010 07/09/2014 0950   PHURINE 6.0 09/25/2020 1126   GLUCOSEU NEGATIVE 09/25/2020 1126   GLUCOSEU Negative 07/09/2014 0950   HGBUR MODERATE (A) 09/25/2020 1126   [place] 09/25/2020 1126   BILIRUBINUR Negative 07/09/2014 0950   KETONESUR TRACE (A) 09/25/2020 1126   PROTEINUR TRACE (A) 09/25/2020 1126   UROBILINOGEN 0.2 07/09/2014 0950   NITRITE POSITIVE (A) 09/25/2020 1126   LEUKOCYTESUR SMALL (A) 09/25/2020 1126   LEUKOCYTESUR Negative 07/09/2014 0950   Sepsis Labs: @LABRCNTIP (procalcitonin:4,lacticidven:4)  ) No results found for this or any previous visit (from the past 240 hour(s)).     Studies: No results found.  Scheduled Meds:  acetaminophen  1,000 mg Per Tube Q6H   enoxaparin (LOVENOX) injection  40 mg Subcutaneous Q24H   feeding supplement (OSMOLITE 1.5 CAL)  474 mL Per Tube TID PC   feeding supplement (PROSource TF)  45 mL Per Tube BID   free water  100 mL  Per Tube TID   multivitamin with minerals  1 tablet Per Tube Daily    Continuous Infusions:   LOS: 28 days     Cristal Deer, MD [facility]  To reach me or the doctor on call, go to: [email or web address] Password [email or web address]  10/25/2020, 9:20 AM

## 2020-10-25 NOTE — Telephone Encounter (Signed)
Scheduled per sch msg. Called and left msg  

## 2020-10-25 NOTE — Progress Notes (Addendum)
Mobility Specialist - Progress Note    10/25/20 1530  Oxygen Therapy  SpO2 100 %  O2 Device Room Air  Mobility  Activity Ambulated in hall;Ambulated to bathroom  Level of Assistance Contact guard assist, steadying assist  Assistive Device Front wheel walker  Distance Ambulated (ft) 80 ft  Mobility Ambulated with assistance in hallway;Ambulated with assistance in room  Mobility Response Tolerated well  Mobility performed by Mobility specialist  $Mobility charge 1 Mobility   Upon entry pt agreed to ambulate and required no assistance to sit at EOB, but did require some assistance when ambulating 80 ft in hallway while using RW. Pt c/o of pain and rated it a 7/10. On return trip pt c/o of back pain, but refused to sit up in recliner and insisted on going back to bed. Pt returned to room to use bathroom and was then left in bed with call bell at side.   [place] Specialist [facility] Phone: [phone] 10/25/20, 3:33 PM

## 2020-10-25 NOTE — Progress Notes (Signed)
Chaplain provided support around Advance Directive / [place] at referral from [place].   Provided support with pt around questions and clarification of document.  Pt presented as alert and oriented x4 in front of witnesses and notary.    Documents notarized.  Pt has original and copies.  Requested they be placed in cabinet in room with his other belongings.  Copy scanned to advance care planning to include in pt's VYNCA.    Marcus [name]

## 2020-10-25 NOTE — Progress Notes (Signed)
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  Patient was started on radiation therapy and is awaiting placement for SNF after radiation treatment as he is having difficulty being placed while he is on radiation therapy.  Patient currently on tube feeding  Subjective: October 21, 2020: Patient seen and examined at bedside Denies any complain  October 22, 2020: Patient seen and examined at bedside Patient had radiation therapy yesterday.  He stated everything went well denies any complain except for his pain  October 23, 2020: Patient seen and examined at bedside he denies any new complaints he is doing well eating well  October 24, 2020: Patient seen and examined at bedside he is complaining of poor appetite he is getting tube feeding through his gastrostomy tube he also gets a tree but nurse reported he only eats about 10%.  He has radiation therapy today  October 25, 2020: Patient seen and examined at bedside still complaining of no appetite.  I explained to him that he is getting his tube feeding with a lot of nutrient and that he and the dietitian has reevaluated and stated that he is getting enough nutrition from his feeding tube.  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  Metastatic  prostate cancer getting radiation therapy.  She actually had a radiation therapy today Oncology following  2.  Poorly differentiated esophageal cancer new diagnosis.  Patient started on PEG feeding  3.  Colon cancer new diagnosis.  Patient is status post Jeanette Caprice pouch procedure and queen STEMI NG tube placement of September 30, 2020  4.  Protein calorie malnutrition due to his esophageal cancer.  He has been started on PEG feeding. He is complaining of poor appetite I will start him on Marinol but patient declined it and so I will discontinue it  5.  Mild hyponatremia.  Slightly improved today to 133 we will continue to monitor patient encouraged to eat regularly Code Status: Full  Severity of Illness: The appropriate patient status for this patient is INPATIENT. Inpatient status is judged to be reasonable and necessary in order to provide the required intensity of service to ensure the patient's safety. The patient's presenting symptoms, physical exam findings, and initial radiographic and laboratory data in the context of their chronic comorbidities is felt to place them at high risk for further clinical deterioration. Furthermore, it is not anticipated that the patient will be medically stable for discharge from the hospital within 2 midnights of admission. The following factors support the patient status of inpatient.   " Waiting for transfer to SNF  * I certify that at the point of admission it is my clinical judgment that the patient will require inpatient hospital care spanning beyond 2 midnights from the point of admission due to high intensity of service, high risk  for further deterioration and high frequency of surveillance required.*   Family Communication: Discussed with patient  Disposition Plan:   Status is: Inpatient   Dispo: The patient is from: Home              Anticipated d/c is to: SNF/REHAB              Anticipated d/c date is: BY WEEKEND              Patient  currently not medically stable for discharge  Consultants: Radiology oncology  Procedures: Hartmann procedure PEG placement   Antimicrobials: none   DVT prophylaxis: Lovenox   Objective: Vitals:   10/24/20 2051 10/25/20 0305 10/25/20 1453 10/25/20 1530  BP:  134/69 133/65   Pulse:  92 89   Resp:  18 16   Temp:  98.4 F (36.9 C) 98.3 F (36.8 C)   TempSrc:  Oral Oral   SpO2:  100% 100% 100%  Weight: 64.9 kg     Height:       No intake or output data in the 24 hours ending 10/25/20 1755  Filed Weights   10/23/20 0439 10/24/20 0402 10/24/20 2051  Weight: 67.5 kg 64.9 kg 64.9 kg   Body mass index is 20.53 kg/m.  Exam:  General: 75 y.o. year-old male well developed well nourished in no acute distress.  Alert and oriented x3.  Thin looking Cardiovascular: Regular rate and rhythm with no rubs or gallops.  No thyromegaly or JVD noted.   Respiratory: Clear to auscultation with no wheezes or rales. Good inspiratory effort. Abdomen: Soft nontender nondistended with normal bowel sounds x4 quadrants.  Colostomy bag in place and functioning Musculoskeletal: No lower extremity edema. 2/4 pulses in all 4 extremities. Skin: No ulcerative lesions noted or rashes, Psychiatry: Mood is appropriate for condition and setting Neurology:    Data Reviewed: CBC: Recent Labs  Lab 10/24/20 1605  WBC 4.4  HGB 9.5*  HCT 29.5*  MCV 80.2  PLT 846    Basic Metabolic Panel: Recent Labs  Lab 10/19/20 0507 10/19/20 1459 10/19/20 1524 10/21/20 1536 10/22/20 1138 10/23/20 1829 10/25/20 0440  NA  --  141  --  131* 133* 132*  --   K  --  5.0  --  3.9 4.1 4.1  --   CL  --  102  --  97* 99 99  --   CO2  --  30  --  26 28 27   --   GLUCOSE  --  95  --  106* 173* 145*  --   BUN  --  14  --  17 17 18   --   CREATININE  --  0.66  --  0.74 0.81 0.71 0.60*  CALCIUM  --  9.2  --  8.9 9.1 9.0  --   MG 2.2  --  1.9  --   --   --   --   PHOS 4.3  --   --   --   --   --   --      GFR: Estimated Creatinine Clearance: 76.6 mL/min (A) (by C-G formula based on SCr of 0.6 mg/dL (L)). Liver Function Tests: No results for input(s): AST, ALT, ALKPHOS, BILITOT, PROT, ALBUMIN in the last 168 hours.  No results for input(s): LIPASE, AMYLASE in the last 168 hours. No results for input(s): AMMONIA in the last 168 hours. Coagulation Profile: No results for input(s): INR, PROTIME in the last 168 hours.  Cardiac Enzymes: No results for input(s): CKTOTAL, CKMB, CKMBINDEX, TROPONINI in the last 168 hours. BNP (last 3 results) No results for input(s): PROBNP in the last 8760 hours. HbA1C: No results for input(s): HGBA1C in the last 72 hours. CBG: Recent Labs  Lab 10/24/20 2304 10/25/20 0309 10/25/20 0822 10/25/20 1210 10/25/20 1616  GLUCAP 205* 127* 118* 155* 142*    Lipid Profile: No results for input(s): CHOL, HDL, LDLCALC, TRIG, CHOLHDL, LDLDIRECT in the last 72 hours. Thyroid Function Tests: No results for input(s): TSH, T4TOTAL, FREET4, T3FREE, THYROIDAB in the last 72 hours. Anemia Panel: No results for input(s): VITAMINB12, FOLATE, FERRITIN, TIBC, IRON, RETICCTPCT in the last 72 hours. Urine analysis:    Component Value Date/Time   COLORURINE YELLOW (A) 09/25/2020 1126   APPEARANCEUR CLOUDY (A) 09/25/2020 1126   LABSPEC 1.020 09/25/2020 1126   LABSPEC 1.010 07/09/2014 0950   PHURINE 6.0 09/25/2020 1126   GLUCOSEU NEGATIVE 09/25/2020 1126   GLUCOSEU Negative 07/09/2014 0950   HGBUR MODERATE (A) 09/25/2020 1126   [place] 09/25/2020 1126   BILIRUBINUR Negative 07/09/2014 0950   KETONESUR TRACE (A) 09/25/2020 1126   PROTEINUR TRACE (A) 09/25/2020 1126   UROBILINOGEN 0.2 07/09/2014 0950   NITRITE POSITIVE (A) 09/25/2020 1126   LEUKOCYTESUR SMALL (A) 09/25/2020 1126   LEUKOCYTESUR Negative 07/09/2014 0950   Sepsis Labs: @LABRCNTIP (procalcitonin:4,lacticidven:4)  ) No results found for this or any previous visit (from the past 240  hour(s)).     Studies: No results found.  Scheduled Meds:  acetaminophen  1,000 mg Per Tube Q6H   enoxaparin (LOVENOX) injection  40 mg Subcutaneous Q24H   feeding supplement (OSMOLITE 1.5 CAL)  474 mL Per Tube TID PC   feeding supplement (PROSource TF)  45 mL Per Tube BID   free water  100 mL Per Tube TID   multivitamin with minerals  1 tablet Per Tube Daily    Continuous Infusions:   LOS: 28 days     Cristal Deer, MD [facility]  To reach me or the doctor on call, go to: [email or web address] Password [email or web address]  10/25/2020, 5:55 PM

## 2020-10-26 ENCOUNTER — Ambulatory Visit
Admit: 2020-10-26 | Discharge: 2020-10-26 | Disposition: A | Discharge status: Home / Self Care | Payer: Medicare Other | Attending: Radiation Oncology | Admitting: Radiation Oncology

## 2020-10-26 DIAGNOSIS — C187 Malignant neoplasm of sigmoid colon: Secondary | ICD-10-CM | POA: Diagnosis not present

## 2020-10-26 DIAGNOSIS — C154 Malignant neoplasm of middle third of esophagus: Secondary | ICD-10-CM | POA: Diagnosis not present

## 2020-10-26 DIAGNOSIS — C61 Malignant neoplasm of prostate: Secondary | ICD-10-CM | POA: Diagnosis not present

## 2020-10-26 DIAGNOSIS — E43 Unspecified severe protein-calorie malnutrition: Secondary | ICD-10-CM | POA: Diagnosis not present

## 2020-10-26 LAB — GLUCOSE, CAPILLARY
Glucose-Capillary: 111 mg/dL — ABNORMAL HIGH (ref 70–99)
Glucose-Capillary: 113 mg/dL — ABNORMAL HIGH (ref 70–99)
Glucose-Capillary: 113 mg/dL — ABNORMAL HIGH (ref 70–99)
Glucose-Capillary: 125 mg/dL — ABNORMAL HIGH (ref 70–99)
Glucose-Capillary: 92 mg/dL (ref 70–99)
Glucose-Capillary: 97 mg/dL (ref 70–99)

## 2020-10-26 MED ORDER — ACETAMINOPHEN 160 MG/5ML PO SOLN: 650.0000 mg | Freq: Four times a day (QID) | ORAL | Status: DC | PRN

## 2020-10-26 NOTE — Evaluation (Addendum)
Physical Therapy Evaluation Patient Details Name: Marcus [name] MRN: [id number] DOB: August 01, 1945 Today's Date: 10/26/2020  History of Present Illness  Pt is 75 yo male admitted on 09/25/20 with GI bleed, anemia. 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 Arnoldo Morale on 9/4,  R THA, S/P colostomy 09/30/20  Clinical Impression  Pt admitted with above diagnosis. At baseline pt is independent and lives with roommate.  Today, pt requiring min guard for all transfers.  He also required cues for back precautions with all transfers.  Pt with some decreased safety awareness and short term memory issues (was unaware he had back surgery).  Pt also very easily fatigued - he ambulated household distance earlier with mobility tech but was unable to repeat this afternoon.  Due to alone during day, decreased cognition/safety awareness, easily fatigued, and multiple medical needs (PEG and ostomy) - recommend SNF at d/c.  Pt currently with functional limitations due to the deficits listed below (see PT Problem List). Pt will benefit from skilled PT to increase their independence and safety with mobility to allow discharge to the venue listed below.          Recommendations for follow up therapy are one component of a multi-disciplinary discharge planning process, led by the attending physician.  Recommendations may be updated based on patient status, additional functional criteria and insurance authorization.  Follow Up Recommendations SNF    Equipment Recommendations  None recommended by PT    Recommendations for Other Services       Precautions / Restrictions Precautions Precautions: Fall;Back;Other (comment) Precaution Booklet Issued: Yes (comment) Precaution Comments: now has colostomy, cannot wear brace,reviewed log roll technique Required Braces or Orthoses: Spinal Brace      Mobility  Bed  Mobility Overal bed mobility: Needs Assistance Bed Mobility: Rolling;Sidelying to Sit;Sit to Sidelying Rolling: Min guard Sidelying to sit: Min guard     Sit to sidelying: Min guard General bed mobility comments: Cues for log roll, used rail    Transfers Overall transfer level: Needs assistance Equipment used: Rolling walker (2 wheeled) Transfers: Sit to/from Stand Sit to Stand: Min guard         General transfer comment: min guard to steady  Ambulation/Gait Ambulation/Gait assistance: Min guard Gait Distance (Feet): 60 Feet Assistive device: Rolling walker (2 wheeled) Gait Pattern/deviations: Step-to pattern;Decreased stride length Gait velocity: Decreased   General Gait Details: motivated but fatigued very easily; cues for RW proximity  Stairs            Wheelchair Mobility    Modified Rankin (Stroke Patients Only)       Balance Overall balance assessment: Needs assistance Sitting-balance support: Feet supported Sitting balance-Leahy Scale: Fair Sitting balance - Comments: Able to maintain balance without support   Standing balance support: Single extremity supported;Bilateral upper extremity supported Standing balance-Leahy Scale: Poor Standing balance comment: Requiring at least single UE use; pt attempted to lift hand from RW to don mask but was unable                             Pertinent Vitals/Pain Pain Assessment: Faces Faces Pain Scale: Hurts little more Pain Location: colostomy site and back Pain Descriptors / Indicators: Moaning Pain Intervention(s): Limited activity within patient's tolerance;Monitored during session;Repositioned;Relaxation    Home Living Family/patient expects to be discharged to:: Private residence Living Arrangements: Non-relatives/Friends  Available Help at Discharge: Friend(s);Available PRN/intermittently Type of Home: House Home Access: Stairs to enter Entrance Stairs-Rails: None Entrance  Stairs-Number of Steps: 4 steps to front porch and threshold to enter. does not use back door Home Layout: Multi-level Home Equipment: Walker - 2 wheels Additional Comments: Pt alone during day    Prior Function Level of Independence: Independent         Comments: Performs ADLs. roommate does IADLs. Friends give him rides; Reports active and could ambulate     Hand Dominance        Extremity/Trunk Assessment   Upper Extremity Assessment Upper Extremity Assessment: Generalized weakness    Lower Extremity Assessment Lower Extremity Assessment: LLE deficits/detail;RLE deficits/detail RLE Deficits / Details: ROM WFL; MMT at least 3/5 but not further tested due to back pain LLE Deficits / Details: ROM WFL; MMT at least 3/5 but not further tested due to back pain    Cervical / Trunk Assessment Cervical / Trunk Assessment: Other exceptions Cervical / Trunk Exceptions: forward head  Communication   Communication: No difficulties  Cognition Arousal/Alertness: Awake/alert Behavior During Therapy: WFL for tasks assessed/performed Overall Cognitive Status: Impaired/Different from baseline Area of Impairment: Memory;Safety/judgement                     Memory: Decreased short-term memory   Safety/Judgement: Decreased awareness of safety     General Comments: Pt recalled back precautions but needed cues to follow.  He was also unaware as to why his back hurt and when told he had surgery he was shocked.      General Comments      Exercises     Assessment/Plan    PT Assessment Patient needs continued PT services  PT Problem List Decreased strength;Decreased knowledge of use of DME;Decreased activity tolerance;Decreased safety awareness;Decreased balance;Decreased knowledge of precautions;Decreased mobility;Pain;Decreased cognition       PT Treatment Interventions DME instruction;Gait training;Therapeutic exercise;Balance training;Functional mobility  training;Patient/family education;Therapeutic activities;Stair training    PT Goals (Current goals can be found in the Care Plan section)  Acute Rehab PT Goals Patient Stated Goal: less pain, regain IND    Frequency Min 2X/week   Barriers to discharge        Co-evaluation               AM-PAC PT "6 Clicks" Mobility  Outcome Measure Help needed turning from your back to your side while in a flat bed without using bedrails?: A Little Help needed moving from lying on your back to sitting on the side of a flat bed without using bedrails?: A Little Help needed moving to and from a bed to a chair (including a wheelchair)?: A Little Help needed standing up from a chair using your arms (e.g., wheelchair or bedside chair)?: A Little Help needed to walk in hospital room?: A Little Help needed climbing 3-5 steps with a railing? : A Little 6 Click Score: 18    End of Session Equipment Utilized During Treatment: Gait belt Activity Tolerance: Patient limited by fatigue Patient left: in bed;with call bell/phone within reach;with bed alarm set Nurse Communication: Mobility status PT Visit Diagnosis: Unsteadiness on feet (R26.81);Muscle weakness (generalized) (M62.81);Difficulty in walking, not elsewhere classified (R26.2);Pain    Time: 1647-1700 PT Time Calculation (min) (ACUTE ONLY): 13 min   Charges:   PT Evaluation $PT Eval Low Complexity: 1 Low         Allie Ousley, PT Acute Rehab Services Pager [phone] [facility] [phone]  Mikael Spray [place] Filice 10/26/2020, 5:22 PM

## 2020-10-26 NOTE — Progress Notes (Signed)
PROGRESS NOTE    Marcus [name]  QVZ:[id number] DOB: Aug 16, 1945 DOA: 09/25/2020 PCP: [name], MD   Brief Narrative: Marcus [name] is a 75 y.o. male with a history of metastatic prostate cancer, thoracic spine tumor with cord compression status post laminectomy. Patient with new diagnosis of poorly differentiated squamous cell esophageal cancer in addition to sigmoid colon adenocarcinoma. During this admission, he is s/p Hartmann's procedure with end colostomy and G-tube placement. Port-A-Cath placed for anticipate systemic chemotherapy as an outpatient. Currently receiving radiation therapy to his T-spine and esophagus for palliation.   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 Irene Limbo and has initiated radiation therapy with plans for last session on 10/14.  Severe malnutrition PEG tube placed. -Dietitian recommendations (10/10):  Continue Bolus TF via PEG: - 2 cartons (437ml) of Osmolite 1.5 - TID - 45 mL ProSource TF - BID - 50 mL water flush before and after each bolus - 200 mL water flushes - TID between boluses  Sigmoid colon adenocarcinoma Confirmed via surgical biopsy; zero out of 7 positive lymph nodes. Patient is s/p Hartmann's procedure with end colostomy and G-tube placement on 9/16. Port-A-Cath placed on 9/27. Medical oncology with plans for systemic chemotherapy.  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 Feeding tube placed as mentioned above. Consideration for biopsy of liver lesion in the future, per medical oncology -Radiation  oncology recommendations: radiation to esophagus  Ileus Post-op. Resolved.  Hyponatremia Moderate hyponatremia resolved. Now with intermittent mild hyponatremia without symptoms.  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 Patient started on IV steroids and underwent T5 and T6 laminectomy for resection of extradural tumor; posterior segmental instrumentation from T4 to T8 with globus titanium pedicle screws and rods; posterior lateral arthrodesis at T4-5, T5-6, T6-7, T7-8 with local morselized autograft bone and Zimmer DBM.  -Radiation oncology recommendations: radiation therapy to T-spine   DVT prophylaxis: Lovenox Code Status:   Code Status: Full Code Family Communication: None at bedside Disposition Plan: Discharge to SNF likely in 2 days pending completion of radiation therapy   Consultants:  Urology Palliative care medicine General surgery Gastroenterology Neurosurgery  Procedures:  UPPER ENDOSCOPY (9/12) HARTMAN'S PPROCEDURE (9/16) PORT-A-CATH INSERTION [Placed by General Surgery] (9/27)  Antimicrobials: None    Subjective: No concerns this morning.  Objective: Vitals:   10/25/20 1530 10/25/20 2006 10/26/20 0414 10/26/20 0417  BP:  119/70  132/77  Pulse:  93  94  Resp:  (!) 22  (!) 22  Temp:  98.8 F (37.1 C)  98.2 F (36.8 C)  TempSrc:  Oral    SpO2: 100% 100%  100%  Weight:   68.3 kg   Height:        Intake/Output Summary (Last 24 hours) at 10/26/2020 1054 Last data filed at 10/26/2020 0424 Gross per 24 hour  Intake 320 ml  Output 25 ml  Net 295 ml   Filed Weights   10/24/20 0402 10/24/20 2051 10/26/20 0414  Weight: 64.9 kg 64.9 kg 68.3 kg    Examination:  General exam: Appears calm and comfortable Respiratory system:  Clear to auscultation. Respiratory effort normal. Cardiovascular system: S1 & S2 heard, RRR. 2/6 systolic murmur. Gastrointestinal system: Abdomen is nondistended, soft and  nontender. No organomegaly or masses felt. Normal bowel sounds heard. Central nervous system: Alert and oriented. No focal neurological deficits. Musculoskeletal: No edema. No calf tenderness Skin: No cyanosis. No rashes Psychiatry: Judgement and insight appear normal. Mood & affect appropriate.     Data Reviewed: I have personally reviewed following labs and imaging studies  CBC Lab Results  Component Value Date   WBC 4.4 10/24/2020   RBC 3.68 (L) 10/24/2020   HGB 9.5 (L) 10/24/2020   HCT 29.5 (L) 10/24/2020   MCV 80.2 10/24/2020   MCH 25.8 (L) 10/24/2020   PLT 273 10/24/2020   MCHC 32.2 10/24/2020   RDW 19.7 (H) 10/24/2020   LYMPHSABS 0.7 10/10/2020   MONOABS 0.7 10/10/2020   EOSABS 0.1 10/10/2020   BASOSABS 0.0 23/30/0762     Last metabolic panel Lab Results  Component Value Date   NA 132 (L) 10/23/2020   K 4.1 10/23/2020   CL 99 10/23/2020   CO2 27 10/23/2020   BUN 18 10/23/2020   CREATININE 0.60 (L) 10/25/2020   GLUCOSE 145 (H) 10/23/2020   GFRNONAA >60 10/25/2020   GFRAA >60 09/17/2018   CALCIUM 9.0 10/23/2020   PHOS 4.3 10/19/2020   PROT 6.2 (L) 10/16/2020   ALBUMIN 2.7 (L) 10/16/2020   LABGLOB 3.3 09/15/2020   BILITOT 0.4 10/16/2020   ALKPHOS 114 10/16/2020   AST 13 (L) 10/16/2020   ALT 10 10/16/2020   ANIONGAP 6 10/23/2020    CBG (last 3)  Recent Labs    10/25/20 2346 10/26/20 0414 10/26/20 0814  GLUCAP 209* 125* 113*     GFR: Estimated Creatinine Clearance: 80.6 mL/min (A) (by C-G formula based on SCr of 0.6 mg/dL (L)).  Coagulation Profile: No results for input(s): INR, PROTIME in the last 168 hours.  No results found for this or any previous visit (from the past 240 hour(s)).      Radiology Studies: No results found.      Scheduled Meds:  acetaminophen  1,000 mg Per Tube Q6H   enoxaparin (LOVENOX) injection  40 mg Subcutaneous Q24H   feeding supplement (OSMOLITE 1.5 CAL)  474 mL Per Tube TID PC   feeding supplement  (PROSource TF)  45 mL Per Tube BID   free water  100 mL Per Tube TID   multivitamin with minerals  1 tablet Per Tube Daily   Continuous Infusions:   LOS: 29 days     Cordelia Poche, MD [facility] 10/26/2020, 10:54 AM  If 7PM-7AM, please contact night-coverage [email or web address]

## 2020-10-26 NOTE — Progress Notes (Signed)
   10/26/20 1100  Mobility  Activity Ambulated in hall  Level of Assistance Standby assist, set-up cues, supervision of patient - no hands on  Assistive Device Front wheel walker  Distance Ambulated (ft) 160 ft  Mobility Ambulated with assistance in hallway  Mobility Response Tolerated well  Mobility performed by Mobility specialist  $Mobility charge 1 Mobility   Pt agreeable and eager to mobilizing this afternoon. Prior to mobilizing, pt noted some left upper back pain, but is unsure of the cause. Ambulated about 128ft in the hall with RW, tolerated well. Had 1 standing rest break, pt wanted to look out the window. Upon return, pt stated he was feeling "a little tired", but otherwise no complaints. Pt left in bed with call bell at side. RN notified of session.   [place] Specialist Acute Rehab Services Office: [phone]

## 2020-10-27 ENCOUNTER — Ambulatory Visit
Admit: 2020-10-27 | Discharge: 2020-10-27 | Disposition: A | Discharge status: Home / Self Care | Payer: Medicare Other | Attending: Radiation Oncology | Admitting: Radiation Oncology

## 2020-10-27 DIAGNOSIS — C154 Malignant neoplasm of middle third of esophagus: Secondary | ICD-10-CM | POA: Diagnosis not present

## 2020-10-27 DIAGNOSIS — C61 Malignant neoplasm of prostate: Secondary | ICD-10-CM | POA: Diagnosis not present

## 2020-10-27 DIAGNOSIS — C187 Malignant neoplasm of sigmoid colon: Secondary | ICD-10-CM | POA: Diagnosis not present

## 2020-10-27 DIAGNOSIS — K2289 Other specified disease of esophagus: Secondary | ICD-10-CM | POA: Diagnosis not present

## 2020-10-27 LAB — GLUCOSE, CAPILLARY
Glucose-Capillary: 165 mg/dL — ABNORMAL HIGH (ref 70–99)
Glucose-Capillary: 99 mg/dL (ref 70–99)
Glucose-Capillary: 180 mg/dL — ABNORMAL HIGH (ref 70–99)
Glucose-Capillary: 146 mg/dL — ABNORMAL HIGH (ref 70–99)
Glucose-Capillary: 113 mg/dL — ABNORMAL HIGH (ref 70–99)
Glucose-Capillary: 152 mg/dL — ABNORMAL HIGH (ref 70–99)

## 2020-10-27 LAB — RESP PANEL BY RT-PCR (FLU A&B, COVID) ARPGX2
Influenza A by PCR: NEGATIVE
Influenza B by PCR: NEGATIVE
SARS Coronavirus 2 by RT PCR: NEGATIVE

## 2020-10-27 NOTE — TOC Progression Note (Signed)
Transition of Care [facility]) - Progression Note    Patient Details  Name: Marcus [name] MRN: [id number] Date of Birth: 04-26-1945  Transition of Care [facility]) CM/SW [place], [place] Phone Number: 10/27/2020, 1:00 PM  Clinical Narrative:   Patient has been officially offered bed at [facility]. Insurance authorization 10/14-18.  Reference #[id number]. MD ordered COVID test. TOC will continue to follow during the course of hospitalization.     Expected Discharge Plan: Skilled Nursing Facility Barriers to Discharge: SNF Pending bed offer  Expected Discharge Plan and Services Expected Discharge Plan: [place]                                               Social Determinants of Health (SDOH) Interventions    Readmission Risk Interventions Readmission Risk Prevention Plan 09/30/2020  Transportation Screening Complete  PCP or Specialist Appt within 5-7 Days Complete  Home Care Screening Complete  Medication Review (RN CM) Complete  Some recent data might be hidden

## 2020-10-27 NOTE — Progress Notes (Signed)
Pt was not wanting to try  osmolite scheduled at 2000, states stomach did not feel right, ribs hurt.  Free water was given after the Prosource @ 2255

## 2020-10-27 NOTE — Progress Notes (Addendum)
Pt is reluctant to take any pain medication despite c/o pain in his chest ribs and abdomen. Tylenol or tramadol were offered several times but he declined meds  I was able to give 1 serving of the osmolite 264ml at this time, pt was c/o pain in his chest and ribs and did not want a full dose that was scheduled at 8pm

## 2020-10-27 NOTE — Progress Notes (Signed)
PROGRESS NOTE    Marcus [name]  [id number]:[id number] DOB: 1945/11/10 DOA: 09/25/2020 PCP: [name], MD   Brief Narrative: Marcus [name] is a 75 y.o. male with a history of metastatic prostate cancer, thoracic spine tumor with cord compression status post laminectomy. Patient with new diagnosis of poorly differentiated squamous cell esophageal cancer in addition to sigmoid colon adenocarcinoma. During this admission, he is s/p Hartmann's procedure with end colostomy and G-tube placement. Port-A-Cath placed for anticipate systemic chemotherapy as an outpatient. Currently receiving radiation therapy to his T-spine and esophagus for palliation.   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 Irene Limbo and has initiated radiation therapy with plans for last session on 10/14.  Severe malnutrition PEG tube placed. -Dietitian recommendations (10/10):  Continue Bolus TF via PEG: - 2 cartons (437ml) of Osmolite 1.5 - TID - 45 mL ProSource TF - BID - 50 mL water flush before and after each bolus - 200 mL water flushes - TID between boluses  Sigmoid colon adenocarcinoma Confirmed via surgical biopsy; zero out of 7 positive lymph nodes. Patient is s/p Hartmann's procedure with end colostomy and G-tube placement on 9/16. Port-A-Cath placed on 9/27. Medical oncology with plans for systemic chemotherapy as an outpatient.  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 Feeding tube placed as mentioned above. Consideration for biopsy of liver lesion in the future, per medical  oncology -Radiation oncology recommendations: palliative radiation to esophagus  Ileus Post-op. Resolved.  Hyponatremia Moderate hyponatremia resolved. Now with intermittent mild hyponatremia without symptoms.  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 Patient started on IV steroids and underwent T5 and T6 laminectomy for resection of extradural tumor; posterior segmental instrumentation from T4 to T8 with globus titanium pedicle screws and rods; posterior lateral arthrodesis at T4-5, T5-6, T6-7, T7-8 with local morselized autograft bone and Zimmer DBM.  -Radiation oncology recommendations: palliative radiation therapy to T-spine   DVT prophylaxis: Lovenox Code Status:   Code Status: Full Code Family Communication: None at bedside Disposition Plan: Discharge to SNF likely in 1 day pending completion of radiation therapy   Consultants:  Urology Palliative care medicine General surgery Gastroenterology Neurosurgery  Procedures:  UPPER ENDOSCOPY (9/12) HARTMAN'S PPROCEDURE (9/16) PORT-A-CATH INSERTION [Placed by General Surgery] (9/27)  Antimicrobials: None    Subjective: Patient reports no concerns today. States that he plans to move to [place] to live with his brother at some point. I discussed importance of letting his oncologist know his plans ahead of time whenever they are finalized.  Objective: Vitals:   10/26/20 1225 10/26/20 2003 10/27/20 0348 10/27/20 0352  BP: 138/73 131/78 117/62   Pulse: 90 95 100   Resp:  20 20   Temp: 98 F (36.7 C) 98.2 F (36.8 C) 98.4 F (36.9 C)   TempSrc: Oral Oral Oral   SpO2: 100% 97% 95%   Weight:    68.5 kg  Height:       No intake or output data in the 24 hours ending 10/27/20 Marcus [name] Weights   10/24/20 2051 10/26/20 0414 10/27/20 0352  Weight: 64.9 kg 68.3 kg 68.5 kg    Examination:  General exam: Appears calm and comfortable Respiratory system: Clear to  auscultation. Respiratory effort normal. Cardiovascular system: S1 & S2 heard, RRR. Gastrointestinal system: Abdomen is nondistended, soft and nontender. No organomegaly or masses felt. Normal bowel sounds heard. Brown stool in ostomy bag Central nervous system: Alert and oriented. No focal neurological deficits. Musculoskeletal: No edema. No calf tenderness Skin: No cyanosis. No rashes Psychiatry: Judgement and insight appear normal. Mood & affect appropriate.     Data Reviewed: I have personally reviewed following labs and imaging studies  CBC Lab Results  Component Value Date   WBC 4.4 10/24/2020   RBC 3.68 (L) 10/24/2020   HGB 9.5 (L) 10/24/2020   HCT 29.5 (L) 10/24/2020   MCV 80.2 10/24/2020   MCH 25.8 (L) 10/24/2020   PLT 273 10/24/2020   MCHC 32.2 10/24/2020   RDW 19.7 (H) 10/24/2020   LYMPHSABS 0.7 10/10/2020   MONOABS 0.7 10/10/2020   EOSABS 0.1 10/10/2020   BASOSABS 0.0 49/67/5916     Last metabolic panel Lab Results  Component Value Date   NA 132 (L) 10/23/2020   K 4.1 10/23/2020   CL 99 10/23/2020   CO2 27 10/23/2020   BUN 18 10/23/2020   CREATININE 0.60 (L) 10/25/2020   GLUCOSE 145 (H) 10/23/2020   GFRNONAA >60 10/25/2020   GFRAA >60 09/17/2018   CALCIUM 9.0 10/23/2020   PHOS 4.3 10/19/2020   PROT 6.2 (L) 10/16/2020   ALBUMIN 2.7 (L) 10/16/2020   LABGLOB 3.3 09/15/2020   BILITOT 0.4 10/16/2020   ALKPHOS 114 10/16/2020   AST 13 (L) 10/16/2020   ALT 10 10/16/2020   ANIONGAP 6 10/23/2020    CBG (last 3)  Recent Labs    10/26/20 2345 10/27/20 0351 10/27/20 0828  GLUCAP 111* 165* 99      GFR: Estimated Creatinine Clearance: 80.9 mL/min (A) (by C-G formula based on SCr of 0.6 mg/dL (L)).  Coagulation Profile: No results for input(s): INR, PROTIME in the last 168 hours.  No results found for this or any previous visit (from the past 240 hour(s)).      Radiology Studies: No results found.      Scheduled Meds:  enoxaparin  (LOVENOX) injection  40 mg Subcutaneous Q24H   feeding supplement (OSMOLITE 1.5 CAL)  474 mL Per Tube TID PC   feeding supplement (PROSource TF)  45 mL Per Tube BID   free water  100 mL Per Tube TID   multivitamin with minerals  1 tablet Per Tube Daily   Continuous Infusions:   LOS: 30 days     Cordelia Poche, MD [facility] 10/27/2020, 8:35 AM  If 7PM-7AM, please contact night-coverage [email or web address]

## 2020-10-28 ENCOUNTER — Ambulatory Visit
Admit: 2020-10-28 | Discharge: 2020-10-28 | Disposition: A | Discharge status: Home / Self Care | Payer: Medicare Other | Attending: Radiation Oncology | Admitting: Radiation Oncology

## 2020-10-28 ENCOUNTER — Ambulatory Visit: Payer: Medicare Other

## 2020-10-28 ENCOUNTER — Encounter: Payer: Self-pay | Admitting: Urology

## 2020-10-28 DIAGNOSIS — K2289 Other specified disease of esophagus: Secondary | ICD-10-CM | POA: Diagnosis not present

## 2020-10-28 DIAGNOSIS — C7951 Secondary malignant neoplasm of bone: Secondary | ICD-10-CM

## 2020-10-28 DIAGNOSIS — C154 Malignant neoplasm of middle third of esophagus: Secondary | ICD-10-CM

## 2020-10-28 DIAGNOSIS — C187 Malignant neoplasm of sigmoid colon: Secondary | ICD-10-CM | POA: Diagnosis not present

## 2020-10-28 LAB — GLUCOSE, CAPILLARY
Glucose-Capillary: 118 mg/dL — ABNORMAL HIGH (ref 70–99)
Glucose-Capillary: 122 mg/dL — ABNORMAL HIGH (ref 70–99)
Glucose-Capillary: 129 mg/dL — ABNORMAL HIGH (ref 70–99)
Glucose-Capillary: 94 mg/dL (ref 70–99)
Glucose-Capillary: 96 mg/dL (ref 70–99)

## 2020-10-28 MED ORDER — ADULT MULTIVITAMIN W/MINERALS CH: 1.0000 | ORAL_TABLET | Freq: Every day | ORAL | Status: DC

## 2020-10-28 MED ORDER — PROSOURCE TF PO LIQD: 45.0000 mL | Freq: Two times a day (BID) | ORAL | Status: DC

## 2020-10-28 MED ORDER — FERROUS SULFATE 300 (60 FE) MG/5ML PO SYRP: 300.0000 mg | ORAL_SOLUTION | Freq: Every day | ORAL | Status: DC

## 2020-10-28 MED ORDER — FREE WATER: Status: DC

## 2020-10-28 MED ORDER — FREE WATER: 100.0000 mL | Freq: Three times a day (TID) | Status: DC

## 2020-10-28 MED ORDER — ACETAMINOPHEN 160 MG/5ML PO SOLN: 650.0000 mg | Freq: Four times a day (QID) | ORAL | Status: DC | PRN

## 2020-10-28 MED ORDER — OSMOLITE 1.5 CAL PO LIQD: 474.0000 mL | Freq: Three times a day (TID) | ORAL | 0 refills | Status: DC

## 2020-10-28 NOTE — Progress Notes (Signed)
[facility] ([facility])  Hospital Liaison: RN note         This patient has been referred to our palliative care services in the community.  [facility] will continue to follow for any discharge planning needs and to coordinate continuation of palliative care in the outpatient setting.    If you have questions or need assistance, please call [phone] or contact the hospital Liaison listed on AMION.      Thank you for this referral.         Mary Anne Robertson, RN, CCM  [facility] Hospital Liaison   [phone] 

## 2020-10-28 NOTE — Progress Notes (Signed)
Report called and given to Ander Gaster, LPN at [facility] facility.

## 2020-10-28 NOTE — Progress Notes (Signed)
Attempted call to [place] at this time, to give report to RN. No response at this time. Awaiting call from RN.

## 2020-10-28 NOTE — Discharge Summary (Signed)
Physician Discharge Summary  Marcus [name] ZES:[id number] DOB: [birth year] DOA: 09/25/2020  PCP: [name], MD  Admit date: 09/25/2020 Discharge date: 10/28/2020  Admitted From: Home Disposition: SNF  Recommendations for Outpatient Follow-up:  Follow up with PCP in 1 week Follow-up with oncology/general surgery Please obtain BMP/CBC in one week Please follow up on the following pending results: None   Discharge Condition: Stable CODE STATUS: Full code Diet recommendation: Tube feeds per medication list   Brief/Interim Summary:  Admission HPI written by Wendee Beavers, MD  HPI: Marcus [name] is a 75 y.o. 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 Arnoldo Morale on 9/4 and anemia returning with upper back pain, decreased appetite and black stool.   Patient reports thoracic back pain since surgery.  Describes the pain as sharp and constant.  He rates his pain 10/10 at its worst.  Improved to 8/10 after IV morphine in ED. Also improved with p.o. oxycodone but not much.  Pain is worse with movement.  He denies radiation, numbness, tingling or weakness in his legs or arms.  He denies bowel or bladder habit change.  He reports poor p.o. intake since he had a back surgery which prompted his friends to call EMS.  He also noted black stool but he denies hematochezia.  Has been taking 2 tablets of ibuprofen daily since surgery.  He is not on blood thinner.  Denies any other over-the-counter pain medication.  Denies taking oral iron.  He denies fever, chills, URI symptoms, cough, chest pain, dyspnea, nausea, vomiting, dysuria, frequency or urgency.  He admits to lightheadedness even before surgery.   Patient was discharged home with home health and rolling walker after recent surgery.  Home health has not started  service yet.   Patient denies personal or family history of colon cancer.  He says, he never had EGD and colonoscopy.   He lives with 2 friends.  He smokes about 6 to 7 cigarettes a day.  Admits to drinking about 40 ounce of beer a day.  He denies recreational drug use.  Prefers to remain full code.    Hospital course:  Prostate cancer Patient follows with Dr. Irene Limbo and has initiated radiation therapy with plans for last session on 10/14.    Severe malnutrition PEG tube placed. On tube feeds with free water.   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 Patient is s/p Hartmann's procedure with end colostomy and G-tube placement on 9/16. Port-A-Cath placed on 9/27. Medical oncology with plans for systemic chemotherapy as an outpatient.   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 Feeding tube placed as mentioned above. Consideration for biopsy of liver lesion in the future, per medical oncology. Completed palliative radiation to esophagus x14 days.   Ileus Post-op. Resolved.   Hyponatremia Moderate hyponatremia resolved. Now with intermittent mild hyponatremia without symptoms.   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 Patient started on IV steroids and underwent T5 and T6 laminectomy for resection of extradural tumor; posterior segmental instrumentation from T4 to T8 with globus titanium pedicle screws and rods; posterior lateral arthrodesis at T4-5, T5-6, T6-7, T7-8 with local morselized autograft bone and Zimmer DBM. Completed palliative radiation x14 days.  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)   Esophageal mass   Cancer of sigmoid colon (HCC) pending  confirmatory biopsy   Goals of care, counseling/discussion   Protein-calorie malnutrition, severe    Discharge Instructions  Discharge Instructions     No wound care   Complete by: As directed       Allergies as of 10/28/2020   No Known Allergies      Medication List     STOP taking these medications    acetaminophen 325 MG tablet Commonly known as: Tylenol Replaced by: acetaminophen 160 MG/5ML solution   amLODipine 10 MG tablet Commonly known as: NORVASC   cyclobenzaprine 10 MG tablet Commonly known as: FLEXERIL   docusate sodium 100 MG capsule Commonly known as: COLACE   ferrous sulfate 325 (65 FE) MG tablet Commonly known as: FerrouSul Replaced by: ferrous sulfate 300 (60 Fe) MG/5ML syrup   IBUPROFEN IB PO   Oxycodone HCl 10 MG Tabs       TAKE these medications    acetaminophen 160 MG/5ML solution Commonly known as: TYLENOL Place 20.3 mLs (650 mg total) into feeding tube every 6 (six) hours as needed for mild pain. Replaces: acetaminophen 325 MG tablet   feeding supplement (OSMOLITE 1.5 CAL) Liqd Place 474 mLs into feeding tube 3 (three) times daily after meals.   feeding supplement (PROSource TF) liquid Place 45 mLs into feeding tube 2 (two) times daily.   ferrous sulfate 300 (60 Fe) MG/5ML syrup Place 5 mLs (300 mg total) into feeding tube daily. Replaces: ferrous sulfate 325 (65 FE) MG tablet   free water Soln 50 ml free water flush before and after each TF bolus given   multivitamin with minerals Tabs tablet Place 1 tablet into feeding tube daily.        Follow-up Information     Greer Pickerel, MD Follow up on 11/04/2020.   Specialty: General Surgery Why: arrive at 1:00pm for a 1:30pm appointment Contact information: [place] [id number] [phone]                No Known Allergies  Consultations: Urology Palliative care medicine General  surgery Gastroenterology   Procedures/Studies: DG Abd 1 View  Result Date: 10/02/2020 CLINICAL DATA:  Postoperative Hartmann's and PEG placement. Generalized abdominal pain. EXAM: ABDOMEN - 1 VIEW COMPARISON:  CT chest abdomen and pelvis 09/26/2020. FINDINGS: There is skin staples and surgical drain in the pelvis, new from prior. There is diffuse gaseous distension of large and small bowel to the level of the rectum. There is some mildly dilated air-filled small bowel loops. There is mild elevation of the right hemidiaphragm. Free air cannot be excluded on a supine view. Peg tube overlies the upper abdomen. 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 IMPRESSION: 1. There is diffuse gaseous distension of large and small bowel suspicious for ileus. Some small bowel loops are mildly dilated and a small bowel obstruction cannot be excluded. 2. New postsurgical changes in the pelvis. Electronically Signed   By: Ronney Asters M.D.   On: 10/02/2020 15:51   CT ABDOMEN PELVIS W CONTRAST  Result Date: 10/07/2020 CLINICAL DATA:  Abdominal pain, fever, colon cancer status post sigmoid colectomy. Remote history of prostate cancer with osseous metastases. EXAM: CT ABDOMEN AND PELVIS WITH CONTRAST TECHNIQUE: Multidetector CT imaging of the abdomen  and pelvis was performed using the standard protocol following bolus administration of intravenous contrast. CONTRAST:  51mL OMNIPAQUE IOHEXOL 350 MG/ML SOLN COMPARISON:  None. FINDINGS: Lower chest: Scattered areas of subsegmental atelectasis are noted within the visualized lung bases. More focal consolidation within the peripheral basilar right middle lobe is nonspecific, best seen on image # 36/4, possibly infectious or inflammatory in nature. Trace pericardial fluid is stable. Cardiac size within normal limits. Hepatobiliary: Scattered hepatic hypodensities are again identified and are indeterminate. Hypoenhancing mass within the right hepatic lobe most  compatible with a hepatic metastasis is unchanged measuring 2.6 x 3.2 cm at axial image # 20/2. There is interval development of mild intra and extrahepatic biliary ductal dilation, new since prior examination, as well as distention of the gallbladder. The extrahepatic bile duct measures 10-11 mm in diameter, previously measuring 7 mm. Extrahepatic biliary ductal dilation extends to the level of the ampulla. No definite intraluminal filling defect is identified. No pericholecystic inflammatory changes seen. Pancreas: Unremarkable Spleen: Unremarkable Adrenals/Urinary Tract: The adrenal glands are unremarkable. The kidneys are normal in size and position. Multiple simple cortical cysts are seen within the left kidney. The kidneys are otherwise unremarkable. The bladder is partially obscured by streak artifact from right total hip arthroplasty, however, appears decompressed. Stomach/Bowel: Interval gastrostomy, sigmoid colectomy, left lower quadrant descending colostomy, and Hartmann pouch formation. Right lower quadrant surgical drainage catheter extends into the deep pelvis. The proximal small bowel appears diffusely dilated and fluid-filled to the level of the distal jejunum where this demonstrates gradual transition to decompressed small bowel, best seen on coronal image # 47/5 and axial image # 59/2. There is no obstructing mass identified in this location. The terminal ileum and large bowel appears decompressed. No free intraperitoneal gas. No free or loculated fluid noted within the peritoneum. The appendix is unremarkable. Vascular/Lymphatic: Partially necrotic periceliac and gastrohepatic lymphadenopathy is unchanged with the index lymph node measuring 1.3 x 2.0 cm at axial image # 23-2. Retroperitoneal adenopathy within the left periaortic and aortocaval lymph node groups appears stable. There is extensive aortoiliac atherosclerotic calcification. No aortic aneurysm. Ulcerated atheromatous plaque again noted  within the left common iliac artery with a a concomitant greater than 75% stenosis of this vessel. Roughly 50% focal stenosis of the right common iliac artery again noted. Reproductive: Status post prostatectomy. Other: Mild presacral edema is likely postoperative in nature. Laparotomy skin staples noted within the infraumbilical midline. Tiny fat containing umbilical and small bilateral fat containing inguinal hernias are present. Subcutaneous gas within the right abdominal wall may relate to subcutaneous injection or may be postoperative in nature. 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 Right total hip arthroplasty has been performed. Thoracic fusion hardware is partially visualized. No acute bone abnormality. IMPRESSION: Diffusely dilated fluid-filled proximal small bowel with gradual transition to decompressed ileum most in keeping with a postoperative adynamic ileus. Surgical changes in keeping with percutaneous gastrostomy, sigmoid colectomy, descending colostomy, and Hartmann pouch formation. No evidence of surgical complication. Interval development of intra and extrahepatic biliary dilation. Correlation with liver enzymes would be helpful to exclude a obstructive process. ERCP or MRCP examination may be helpful for further evaluation if abnormal. Stable findings of metastatic disease with pathologic retroperitoneal adenopathy and dominant hepatic metastasis within the right hepatic lobe. Additional scattered hypodensities within the liver are too small to accurately characterize and would be better assessed with contrast enhanced MRI examination once the patient is  clinically able to do so. Bibasilar segmental atelectasis. More focal consolidation within the basilar right middle lobe is nonspecific and may reflect a superimposed focal infectious or inflammatory process. Peripheral vascular disease. 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 Aortic Atherosclerosis (ICD10-I70.0). Electronically Signed   By: Fidela Salisbury M.D.   On: 10/07/2020 21:02   DG CHEST PORT 1 VIEW  Result Date: 10/11/2020 CLINICAL DATA:  Postop Port-A-Cath placement, colon cancer EXAM: PORTABLE CHEST 1 VIEW COMPARISON:  08/31/2020 FINDINGS: Single frontal view of the chest demonstrates left chest wall port via subclavian approach tip overlying the superior vena cava. The cardiac silhouette is unremarkable. No airspace disease, effusion, or pneumothorax. Linear areas of consolidation at the lung bases most compatible with subsegmental atelectasis. Postsurgical changes from thoracic fusion. IMPRESSION: 1. No complication after left-sided chest port placement. 2. Bibasilar subsegmental atelectasis. Electronically Signed   By: Randa Ngo M.D.   On: 10/11/2020 15:39   DG Abd 2 Views  Result Date: 10/07/2020 CLINICAL DATA:  75 year old male with history of recent gastrointestinal surgery. Postoperative ileus. EXAM: ABDOMEN - 2 VIEW COMPARISON:  Abdominal radiograph 10/02/2020. FINDINGS: Stomach is not distended. There is a paucity of colonic gas, however, there is some gas in the splenic flexure of the colon. Numerous markedly dilated loops of gas-filled small bowel are noted throughout the central abdomen, which demonstrate multiple air-fluid levels on the upright projection. Loops are dilated up to 4.7 cm in diameter, substantially worsened compared to the prior study. Midline skin staples are noted in the lower pelvis. Surgical drain extending into the low anatomic pelvis. No definite pneumoperitoneum. Status post right hip arthroplasty. IMPRESSION: 1. Worsening postoperative ileus, as above. Electronically Signed   By: Vinnie Langton M.D.   On: 10/07/2020 13:55   DG C-Arm 1-60 Min-No Report  Result Date: 10/11/2020 Fluoroscopy was utilized by the requesting physician.  No radiographic  interpretation.      Subjective: No issues overnight. No abdominal pain, nausea or vomiting.  Discharge Exam: Vitals:   10/27/20 2010 10/28/20 0354  BP: 130/75 126/74  Pulse: 93 (!) 108  Resp: 18 20  Temp: 98.1 F (36.7 C) 98.8 F (37.1 C)  SpO2: 100% 98%   Vitals:   10/27/20 1207 10/27/20 2010 10/28/20 0354 10/28/20 0500  BP: 127/73 130/75 126/74   Pulse: 100 93 (!) 108   Resp: 18 18 20    Temp: 98.1 F (36.7 C) 98.1 F (36.7 C) 98.8 F (37.1 C)   TempSrc: Oral Oral Oral   SpO2: 99% 100% 98%   Weight:    67.2 kg  Height:        General: Pt is alert, awake, not in acute distress Cardiovascular: RRR, S1/S2 +, no rubs, no gallops Respiratory: CTA bilaterally, no wheezing, no rhonchi Abdominal: Soft, NT, ND, bowel sounds + Extremities: no edema, no cyanosis    The results of significant diagnostics from this hospitalization (including imaging, microbiology, ancillary and laboratory) are listed below for reference.     Microbiology: Recent Results (from the past 240 hour(s))  Resp Panel by RT-PCR (Flu A&B, Covid) Nasopharyngeal Swab     Status: None   Collection Time: 10/27/20  2:09 PM   Specimen: Nasopharyngeal Swab; Nasopharyngeal(NP) swabs in vial transport medium  Result Value Ref Range Status   SARS Coronavirus 2 by RT PCR NEGATIVE NEGATIVE Final    Comment: (NOTE) SARS-CoV-2 target nucleic acids are NOT DETECTED.  The SARS-CoV-2 RNA is generally detectable  in upper respiratory specimens during the acute phase of infection. The lowest concentration of SARS-CoV-2 viral copies this assay can detect is 138 copies/mL. A negative result does not preclude SARS-Cov-2 infection and should not be used as the sole basis for treatment or other patient management decisions. A negative result may occur with  improper specimen collection/handling, submission of specimen other than nasopharyngeal swab, presence of viral mutation(s) within the areas targeted by this  assay, and inadequate number of viral copies(<138 copies/mL). A negative result must be combined with clinical observations, patient history, and epidemiological information. The expected result is Negative.  Fact Sheet for Patients:  EntrepreneurPulse.com.au  Fact Sheet for Healthcare Providers:  IncredibleEmployment.be  This test is no t yet approved or cleared by the [place] FDA and  has been authorized for detection and/or diagnosis of SARS-CoV-2 by FDA under an Emergency Use Authorization (EUA). This EUA will remain  in effect (meaning this test can be used) for the duration of the COVID-19 declaration under Section 564(b)(1) of the Act, 21 U.S.C.section 360bbb-3(b)(1), unless the authorization is terminated  or revoked sooner.       Influenza A by PCR NEGATIVE NEGATIVE Final   Influenza B by PCR NEGATIVE NEGATIVE Final    Comment: (NOTE) The Xpert Xpress SARS-CoV-2/FLU/RSV plus assay is intended as an aid in the diagnosis of influenza from Nasopharyngeal swab specimens and should not be used as a sole basis for treatment. Nasal washings and aspirates are unacceptable for Xpert Xpress SARS-CoV-2/FLU/RSV testing.  Fact Sheet for Patients: EntrepreneurPulse.com.au  Fact Sheet for Healthcare Providers: IncredibleEmployment.be  This test is not yet approved or cleared by the [place] FDA and has been authorized for detection and/or diagnosis of SARS-CoV-2 by FDA under an Emergency Use Authorization (EUA). This EUA will remain in effect (meaning this test can be used) for the duration of the COVID-19 declaration under Section 564(b)(1) of the Act, 21 U.S.C. section 360bbb-3(b)(1), unless the authorization is terminated or revoked.  Performed at [facility], [place] [address], [place], [place] [address]      Labs: BNP (last 3 results) No results for input(s): BNP in the  last 8760 hours. Basic Metabolic Panel: Recent Labs  Lab 10/21/20 1536 10/22/20 1138 10/23/20 1829 10/25/20 0440  NA 131* 133* 132*  --   K 3.9 4.1 4.1  --   CL 97* 99 99  --   CO2 26 28 27   --   GLUCOSE 106* 173* 145*  --   BUN 17 17 18   --   CREATININE 0.74 0.81 0.71 0.60*  CALCIUM 8.9 9.1 9.0  --    Liver Function Tests: No results for input(s): AST, ALT, ALKPHOS, BILITOT, PROT, ALBUMIN in the last 168 hours. No results for input(s): LIPASE, AMYLASE in the last 168 hours. No results for input(s): AMMONIA in the last 168 hours. CBC: Recent Labs  Lab 10/24/20 1605  WBC 4.4  HGB 9.5*  HCT 29.5*  MCV 80.2  PLT 273   Cardiac Enzymes: No results for input(s): CKTOTAL, CKMB, CKMBINDEX, TROPONINI in the last 168 hours. BNP: Invalid input(s): POCBNP CBG: Recent Labs  Lab 10/27/20 1741 10/27/20 2006 10/27/20 2315 10/28/20 0352 10/28/20 0748  GLUCAP 146* 113* 152* 118* 122*   D-Dimer No results for input(s): DDIMER in the last 72 hours. Hgb A1c No results for input(s): HGBA1C in the last 72 hours. Lipid Profile No results for input(s): CHOL, HDL, LDLCALC, TRIG, CHOLHDL, LDLDIRECT in the last 72 hours.  Thyroid function studies No results for input(s): TSH, T4TOTAL, T3FREE, THYROIDAB in the last 72 hours.  Invalid input(s): FREET3 Anemia work up No results for input(s): VITAMINB12, FOLATE, FERRITIN, TIBC, IRON, RETICCTPCT in the last 72 hours. Urinalysis    Component Value Date/Time   COLORURINE YELLOW (A) 09/25/2020 1126   APPEARANCEUR CLOUDY (A) 09/25/2020 1126   LABSPEC 1.020 09/25/2020 1126   LABSPEC 1.010 07/09/2014 0950   PHURINE 6.0 09/25/2020 1126   GLUCOSEU NEGATIVE 09/25/2020 1126   GLUCOSEU Negative 07/09/2014 0950   HGBUR MODERATE (A) 09/25/2020 1126   [place] 09/25/2020 1126   BILIRUBINUR Negative 07/09/2014 0950   KETONESUR TRACE (A) 09/25/2020 1126   PROTEINUR TRACE (A) 09/25/2020 1126   UROBILINOGEN 0.2 07/09/2014 0950    NITRITE POSITIVE (A) 09/25/2020 1126   LEUKOCYTESUR SMALL (A) 09/25/2020 1126   LEUKOCYTESUR Negative 07/09/2014 0950   Sepsis Labs Invalid input(s): PROCALCITONIN,  WBC,  LACTICIDVEN Microbiology Recent Results (from the past 240 hour(s))  Resp Panel by RT-PCR (Flu A&B, Covid) Nasopharyngeal Swab     Status: None   Collection Time: 10/27/20  2:09 PM   Specimen: Nasopharyngeal Swab; Nasopharyngeal(NP) swabs in vial transport medium  Result Value Ref Range Status   SARS Coronavirus 2 by RT PCR NEGATIVE NEGATIVE Final    Comment: (NOTE) SARS-CoV-2 target nucleic acids are NOT DETECTED.  The SARS-CoV-2 RNA is generally detectable in upper respiratory specimens during the acute phase of infection. The lowest concentration of SARS-CoV-2 viral copies this assay can detect is 138 copies/mL. A negative result does not preclude SARS-Cov-2 infection and should not be used as the sole basis for treatment or other patient management decisions. A negative result may occur with  improper specimen collection/handling, submission of specimen other than nasopharyngeal swab, presence of viral mutation(s) within the areas targeted by this assay, and inadequate number of viral copies(<138 copies/mL). A negative result must be combined with clinical observations, patient history, and epidemiological information. The expected result is Negative.  Fact Sheet for Patients:  EntrepreneurPulse.com.au  Fact Sheet for Healthcare Providers:  IncredibleEmployment.be  This test is no t yet approved or cleared by the [place] FDA and  has been authorized for detection and/or diagnosis of SARS-CoV-2 by FDA under an Emergency Use Authorization (EUA). This EUA will remain  in effect (meaning this test can be used) for the duration of the COVID-19 declaration under Section 564(b)(1) of the Act, 21 U.S.C.section 360bbb-3(b)(1), unless the authorization is terminated  or  revoked sooner.       Influenza A by PCR NEGATIVE NEGATIVE Final   Influenza B by PCR NEGATIVE NEGATIVE Final    Comment: (NOTE) The Xpert Xpress SARS-CoV-2/FLU/RSV plus assay is intended as an aid in the diagnosis of influenza from Nasopharyngeal swab specimens and should not be used as a sole basis for treatment. Nasal washings and aspirates are unacceptable for Xpert Xpress SARS-CoV-2/FLU/RSV testing.  Fact Sheet for Patients: EntrepreneurPulse.com.au  Fact Sheet for Healthcare Providers: IncredibleEmployment.be  This test is not yet approved or cleared by the [place] FDA and has been authorized for detection and/or diagnosis of SARS-CoV-2 by FDA under an Emergency Use Authorization (EUA). This EUA will remain in effect (meaning this test can be used) for the duration of the COVID-19 declaration under Section 564(b)(1) of the Act, 21 U.S.C. section 360bbb-3(b)(1), unless the authorization is terminated or revoked.  Performed at [facility], [place] [address] [place], [place] [address]      Time coordinating  discharge: 35 minutes  SIGNED:   Cordelia Poche, MD [facility] 10/28/2020, 9:21 AM

## 2020-10-28 NOTE — TOC Transition Note (Signed)
Transition of Care [facility]) - CM/SW Discharge Note   Patient Details  Name: Marcus [name] MRN: [id number] Date of Birth: [birth year]  Transition of Care [facility]) CM/SW Contact:  Trish Mage, LCSW Phone Number: 10/28/2020, 10:26 AM   Clinical Narrative:   Patient who is stable for d/c will transfer to [facility] today.  Family informed.  PTAR arranged.  Nursing, please call report to [phone]. Bed 212. Made referral to [facility] for outpatient palliative. TOC sign off.    Final next level of care: [place] Barriers to Discharge: Barriers Resolved   Patient Goals and CMS Choice Patient states their goals for this hospitalization and ongoing recovery are:: Pt gave CSW permission to contact [facility]. CMS Medicare.gov Compare Post Acute Care list provided to:: Patient Choice offered to / list presented to : NA  Discharge Placement                       Discharge Plan and Services                                     Social Determinants of Health (SDOH) Interventions     Readmission Risk Interventions Readmission Risk Prevention Plan 09/30/2020  Transportation Screening Complete  PCP or Specialist Appt within 5-7 Days Complete  Home Care Screening Complete  Medication Review (RN CM) Complete  Some recent data might be hidden

## 2020-10-28 NOTE — Progress Notes (Signed)
PT Cancellation Note  Patient Details Name: Marcus [name] MRN: [id number] DOB: September 26, 1945   Cancelled Treatment:    Reason Eval/Treat Not Completed: Other (comment) Pt d/cing to SNF and PTAR called.  Did check with pt and he request to rest as he should be leaving shortly. Marcus [name], PT Acute Rehab Services Pager [phone] [facility] Rehab [phone]   Marcus [name] 10/28/2020, 4:35 PM

## 2020-10-31 ENCOUNTER — Encounter: Payer: Self-pay | Admitting: Adult Health

## 2020-10-31 ENCOUNTER — Ambulatory Visit: Payer: Medicare Other

## 2020-10-31 ENCOUNTER — Non-Acute Institutional Stay (SKILLED_NURSING_FACILITY): Payer: Medicare Other | Admitting: Adult Health

## 2020-10-31 DIAGNOSIS — C187 Malignant neoplasm of sigmoid colon: Secondary | ICD-10-CM

## 2020-10-31 DIAGNOSIS — E43 Unspecified severe protein-calorie malnutrition: Secondary | ICD-10-CM

## 2020-10-31 DIAGNOSIS — C159 Malignant neoplasm of esophagus, unspecified: Secondary | ICD-10-CM

## 2020-10-31 DIAGNOSIS — C7951 Secondary malignant neoplasm of bone: Secondary | ICD-10-CM

## 2020-10-31 DIAGNOSIS — C61 Malignant neoplasm of prostate: Secondary | ICD-10-CM

## 2020-10-31 DIAGNOSIS — R531 Weakness: Secondary | ICD-10-CM

## 2020-10-31 DIAGNOSIS — D500 Iron deficiency anemia secondary to blood loss (chronic): Secondary | ICD-10-CM

## 2020-10-31 NOTE — Progress Notes (Signed)
Location:  [place] Room Number: 108 A Place of Service:  SNF (31) Provider:  Durenda Age, DNP, FNP-BC  Patient Care Team: Marcus Nova, MD as PCP - General (Family Medicine)  Extended Emergency Contact Information Primary Emergency Contact: Marcus [name] (*Brother in law) Mobile Phone: [phone] Relation: Relative Secondary Emergency Contact: Marcus [name] Relation: Brother Interpreter needed? No  Code Status:  FULL CODE  Goals of care: Advanced Directive information Advanced Directives 10/31/2020  Does Patient Have a Medical Advance Directive? Yes  Type of Paramedic of [place];Living will  Does patient want to make changes to medical advance directive? No - Patient declined  Copy of [place] in Chart? Yes - validated most recent copy scanned in chart (See row information)  Would patient like information on creating a medical advance directive? -     Chief Complaint  Patient presents with   Hospitalization Follow-up    HPI:  Pt is a 75 y.o. male who was admitted to [facility] on.  post hospital admission 09/25/2020 to 10/28/2020.  He has a medical history significant for prostate cancer with bone mets S/P radical prostatectomy and radiation in 2015, thoracic spine tumor with cord compression S/P T5-6 laminectomy with posterior lateral arthrodesis from T4-8 by Dr. Arnoldo Morale on 9/4 and anemia returning with upper back pain, decreased appetite and black stool.  He reported having thoracic back pain since surgery.  He describes pain as sharp and constant, 10/10.  Pain improved to 8/10 after IV morphine in the ED.  Oxycodone orally was also given but pain did not improve much.  Pain is worse with movement.  He has been having poor p.o. intake since he had a back surgery which prompted his friends to call EMS.  He noted black stool but denies hematochezia.  He has been taking 2  tablets of ibuprofen daily since surgery.  He denies having had EGD and colonoscopy.  He lives with 2 friends.  He smokes about 6 to 7 cigarettes a day.  He admits to drinking about 40 oz of beer a day.  He denies recreational drug use.  He follows up with Dr. Irene Limbo and has initiated radiation therapy with last session on 10/14.  Confirmed sigmoid colon adenocarcinoma via surgical biopsy, 0/7 positive lymph nodes.  He had procedure with end colostomy and G-tube placement on 9/16.  Port-A-Cath was placed on 9/27.  Medical oncology plans systemic chemotherapy as an outpatient.  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  Liver biopsy will be considered in the future per medical oncology.  Palliative radiation to esophagus x14 days was completed.  T6 bone metastasis/T6 spinal cord compression/paraparesis was treated with IV steroids and T5 and T6 laminectomy for resection of extra dural tumor, posterior segmental instrumentation from T4-T8 with globus titanium pedicle screws and rods.  He was seen walking with a walker from hallway to his room.  He sat on the edge of the bed leaning forward and complained of back pain, 10/10.      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    Prostate cancer ([place]) 09/04/13   Adenocarcinoma   Past Surgical History:  Procedure Laterality Date  BIOPSY  09/26/2020   Procedure: BIOPSY;  Surgeon: Ronnette Juniper, MD;  Location: [facility];  Service: Gastroenterology;;   BIOPSY  09/28/2020   Procedure: BIOPSY;  Surgeon: Ronnette Juniper, MD;  Location: [facility];  Service: Gastroenterology;;   COLON RESECTION N/A 09/30/2020   Procedure: HAND ASSISTED LAPAROSCOPIC HARTMAN'S PROCEDURE WITH END COLOSTOMY; TAP BLOCK, PLACEMENT OF PEG TUBE;  Surgeon: Greer Pickerel,  MD;  Location: [facility];  Service: General;  Laterality: N/A;   ESOPHAGOGASTRODUODENOSCOPY (EGD) WITH PROPOFOL N/A 09/26/2020   Procedure: ESOPHAGOGASTRODUODENOSCOPY (EGD) WITH PROPOFOL;  Surgeon: Ronnette Juniper, MD;  Location: [facility];  Service: Gastroenterology;  Laterality: N/A;   FLEXIBLE SIGMOIDOSCOPY N/A 09/28/2020   Procedure: FLEXIBLE SIGMOIDOSCOPY;  Surgeon: Ronnette Juniper, MD;  Location: [facility];  Service: Gastroenterology;  Laterality: N/A;   LAMINECTOMY N/A 09/18/2020   Procedure: THORACIC LAMINECTOMY FOR TUMOR;  Surgeon: Newman Pies, MD;  Location: [facility];  Service: Neurosurgery;  Laterality: N/A;   LYMPHADENECTOMY Bilateral 09/04/2013   Procedure: PELVIC LYMPH NODE DISSECTION;  Surgeon: Alexis Frock, MD;  Location: [facility];  Service: Urology;  Laterality: Bilateral;   PORTACATH PLACEMENT N/A 10/11/2020   Procedure: INSERTION PORT-A-CATH;  Surgeon: Armandina Gemma, MD;  Location: [facility];  Service: General;  Laterality: N/A;  60 MINUTES ROOM 4   ROBOT ASSISTED LAPAROSCOPIC RADICAL PROSTATECTOMY N/A 09/04/2013   Procedure: ROBOTIC ASSISTED LAPAROSCOPIC RADICAL PROSTATECTOMY, INDOCYANINE GREEN DYE;  Surgeon: Alexis Frock, MD;  Location: [facility];  Service: Urology;  Laterality: N/A;   SUBMUCOSAL TATTOO INJECTION  09/28/2020   Procedure: SUBMUCOSAL TATTOO INJECTION;  Surgeon: Ronnette Juniper, MD;  Location: [facility];  Service: Gastroenterology;;   TOTAL HIP ARTHROPLASTY Right 05/15/2016   Procedure: RIGHT TOTAL HIP ARTHROPLASTY ANTERIOR APPROACH;  Surgeon: Paralee Cancel, MD;  Location: [facility];  Service: Orthopedics;  Laterality: Right;   WRIST GANGLION EXCISION      No Known Allergies  Outpatient Encounter Medications as of 10/31/2020  Medication Sig   acetaminophen (TYLENOL) 160 MG/5ML solution Place 20.3 mLs (650 mg total) into feeding tube every 6 (six) hours as needed for mild pain.   Amino Acids-Protein Hydrolys (PRO-STAT MAX) LIQD Take by mouth. Give 45 ml per g/t twice a day for  supplement   ferrous sulfate 300 (60 Fe) MG/5ML syrup Place 5 mLs (300 mg total) into feeding tube daily.   Multiple Vitamin (MULTIVITAMIN) LIQD Place 10 mLs into feeding tube daily.   Water For Irrigation, Sterile (FREE WATER) SOLN Place 100 mLs into feeding tube every 8 (eight) hours. 50 ml free water flush before and after each TF bolus given   Nutritional Supplements (FEEDING SUPPLEMENT, OSMOLITE 1.5 CAL,) LIQD Place 474 mLs into feeding tube 3 (three) times daily after meals.   Nutritional Supplements (FEEDING SUPPLEMENT, PROSOURCE TF,) liquid Place 45 mLs into feeding tube 2 (two) times daily.   [DISCONTINUED] Multiple Vitamin (MULTIVITAMIN WITH MINERALS) TABS tablet Place 1 tablet into feeding tube daily.   No facility-administered encounter medications on file as of 10/31/2020.    Review of Systems  GENERAL: No fever or chills  MOUTH and THROAT: Denies oral discomfort, gingival pain or bleeding RESPIRATORY: no cough, SOB, DOE, wheezing, hemoptysis CARDIAC: No chest pain, edema or palpitations GI: No abdominal pain, diarrhea, constipation, heart burn, nausea or vomiting GU: Denies dysuria, frequency, hematuria, incontinence, or discharge NEUROLOGICAL: Denies dizziness, syncope, numbness, or headache PSYCHIATRIC: Denies feelings of depression or anxiety. No report of hallucinations, insomnia, paranoia, or agitation   Immunization History  Administered Date(s) Administered  Pension scheme manager 28yrs & up 10/15/2020   Pneumococcal Polysaccharide-23 09/05/2013   Pertinent  Health Maintenance Due  Topic Date Due   COLONOSCOPY (Pts 45-8yrs Insurance coverage will need to be confirmed)  Never done   INFLUENZA VACCINE  Never done   Fall Risk  06/07/2014 04/27/2014 02/16/2014  Falls in the past year? No No No     Vitals:   10/31/20 0935  BP: (!) 163/64  Pulse: 72  Resp: 20  Temp: 97.6 F (36.4 C)  Height: 5\' 10"  (1.778 m)   Body mass index is 21.26  kg/m.  Physical Exam  GENERAL APPEARANCE: Well nourished. In no acute distress. Normal body habitus SKIN:  Skin is warm and dry.  MOUTH and THROAT: Lips are without lesions. Oral mucosa is moist and without lesions.  RESPIRATORY: Breathing is even & unlabored, BS CTAB CARDIAC: RRR, no murmur,no extra heart sounds, no edema GI: Has peg tube and colostomy NEUROLOGICAL: There is no tremor. Speech is clear. Alert and oriented X 3. PSYCHIATRIC:  Affect and behavior are appropriate  Labs reviewed: Recent Labs    10/18/20 0503 10/18/20 1551 10/19/20 0507 10/19/20 1459 10/19/20 1524 10/21/20 1536 10/22/20 1138 10/23/20 1829 10/25/20 0440  NA 137  --   --    < >  --  131* 133* 132*  --   K 4.4  --   --    < >  --  3.9 4.1 4.1  --   CL 107  --   --    < >  --  97* 99 99  --   CO2 23  --   --    < >  --  26 28 27   --   GLUCOSE 111*  --   --    < >  --  106* 173* 145*  --   BUN 9  --   --    < >  --  17 17 18   --   CREATININE 0.63  --   --    < >  --  0.74 0.81 0.71 0.60*  CALCIUM 8.9  --   --    < >  --  8.9 9.1 9.0  --   MG 1.8 2.2 2.2  --  1.9  --   --   --   --   PHOS 3.1 3.2 4.3  --   --   --   --   --   --    < > = values in this interval not displayed.   Recent Labs    10/09/20 0508 10/10/20 0436 10/16/20 0527  AST 14* 13* 13*  ALT 29 22 10   ALKPHOS 153* 129* 114  BILITOT 1.0 0.8 0.4  PROT 5.7* 5.5* 6.2*  ALBUMIN 2.5* 2.4* 2.7*   Recent Labs    09/25/20 1304 09/25/20 1554 10/09/20 0508 10/10/20 0436 10/11/20 1641 10/17/20 1741 10/18/20 0503 10/24/20 1605  WBC 8.2   < > 3.8* 3.8*   < > 5.8 5.4 4.4  NEUTROABS 6.4  --  2.0 2.2  --   --   --   --   HGB 9.9*   < > 8.4* 8.2*   < > 9.0* 8.9* 9.5*  HCT 31.4*   < > 26.6* 25.8*   < > 28.6* 28.3* 29.5*  MCV 80.5   < > 77.3* 77.2*   < > 79.9* 78.4* 80.2  PLT 187   < > 364 371   < >  397 327 273   < > = values in this interval not displayed.   No results found for: TSH Lab Results  Component Value Date   HGBA1C 6.5  (H) 10/02/2020   Lab Results  Component Value Date   CHOL 197 07/14/2009   HDL 55 07/14/2009   LDLCALC 130 (H) 07/14/2009   TRIG 62 07/14/2009   CHOLHDL 3.6 Ratio 07/14/2009    Significant Diagnostic Results in last 30 days:  DG Abd 1 View  Result Date: 10/02/2020 CLINICAL DATA:  Postoperative Hartmann's and PEG placement. Generalized abdominal pain. EXAM: ABDOMEN - 1 VIEW COMPARISON:  CT chest abdomen and pelvis 09/26/2020. FINDINGS: There is skin staples and surgical drain in the pelvis, new from prior. There is diffuse gaseous distension of large and small bowel to the level of the rectum. There is some mildly dilated air-filled small bowel loops. There is mild elevation of the right hemidiaphragm. Free air cannot be excluded on a supine view. Peg tube overlies the upper abdomen. 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 IMPRESSION: 1. There is diffuse gaseous distension of large and small bowel suspicious for ileus. Some small bowel loops are mildly dilated and a small bowel obstruction cannot be excluded. 2. New postsurgical changes in the pelvis. Electronically Signed   By: Ronney Asters M.D.   On: 10/02/2020 15:51   CT ABDOMEN PELVIS W CONTRAST  Result Date: 10/07/2020 CLINICAL DATA:  Abdominal pain, fever, colon cancer status post sigmoid colectomy. Remote history of prostate cancer with osseous metastases. EXAM: CT ABDOMEN AND PELVIS WITH CONTRAST TECHNIQUE: Multidetector CT imaging of the abdomen and pelvis was performed using the standard protocol following bolus administration of intravenous contrast. CONTRAST:  23mL OMNIPAQUE IOHEXOL 350 MG/ML SOLN COMPARISON:  None. FINDINGS: Lower chest: Scattered areas of subsegmental atelectasis are noted within the visualized lung bases. More focal consolidation within the peripheral basilar right middle lobe is nonspecific, best seen on image # 36/4, possibly infectious or inflammatory in nature. Trace pericardial fluid is  stable. Cardiac size within normal limits. Hepatobiliary: Scattered hepatic hypodensities are again identified and are indeterminate. Hypoenhancing mass within the right hepatic lobe most compatible with a hepatic metastasis is unchanged measuring 2.6 x 3.2 cm at axial image # 20/2. There is interval development of mild intra and extrahepatic biliary ductal dilation, new since prior examination, as well as distention of the gallbladder. The extrahepatic bile duct measures 10-11 mm in diameter, previously measuring 7 mm. Extrahepatic biliary ductal dilation extends to the level of the ampulla. No definite intraluminal filling defect is identified. No pericholecystic inflammatory changes seen. Pancreas: Unremarkable Spleen: Unremarkable Adrenals/Urinary Tract: The adrenal glands are unremarkable. The kidneys are normal in size and position. Multiple simple cortical cysts are seen within the left kidney. The kidneys are otherwise unremarkable. The bladder is partially obscured by streak artifact from right total hip arthroplasty, however, appears decompressed. Stomach/Bowel: Interval gastrostomy, sigmoid colectomy, left lower quadrant descending colostomy, and Hartmann pouch formation. Right lower quadrant surgical drainage catheter extends into the deep pelvis. The proximal small bowel appears diffusely dilated and fluid-filled to the level of the distal jejunum where this demonstrates gradual transition to decompressed small bowel, best seen on coronal image # 47/5 and axial image # 59/2. There is no obstructing mass identified in this location. The terminal ileum and large bowel appears decompressed. No free intraperitoneal gas. No free or loculated fluid noted within the peritoneum. The appendix is unremarkable. Vascular/Lymphatic: Partially necrotic periceliac and  gastrohepatic lymphadenopathy is unchanged with the index lymph node measuring 1.3 x 2.0 cm at axial image # 23-2. Retroperitoneal adenopathy within the  left periaortic and aortocaval lymph node groups appears stable. There is extensive aortoiliac atherosclerotic calcification. No aortic aneurysm. Ulcerated atheromatous plaque again noted within the left common iliac artery with a a concomitant greater than 75% stenosis of this vessel. Roughly 50% focal stenosis of the right common iliac artery again noted. Reproductive: Status post prostatectomy. Other: Mild presacral edema is likely postoperative in nature. Laparotomy skin staples noted within the infraumbilical midline. Tiny fat containing umbilical and small bilateral fat containing inguinal hernias are present. Subcutaneous gas within the right abdominal wall may relate to subcutaneous injection or may be postoperative in nature. 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 Right total hip arthroplasty has been performed. Thoracic fusion hardware is partially visualized. No acute bone abnormality. IMPRESSION: Diffusely dilated fluid-filled proximal small bowel with gradual transition to decompressed ileum most in keeping with a postoperative adynamic ileus. Surgical changes in keeping with percutaneous gastrostomy, sigmoid colectomy, descending colostomy, and Hartmann pouch formation. No evidence of surgical complication. Interval development of intra and extrahepatic biliary dilation. Correlation with liver enzymes would be helpful to exclude a obstructive process. ERCP or MRCP examination may be helpful for further evaluation if abnormal. Stable findings of metastatic disease with pathologic retroperitoneal adenopathy and dominant hepatic metastasis within the right hepatic lobe. Additional scattered hypodensities within the liver are too small to accurately characterize and would be better assessed with contrast enhanced MRI examination once the patient is clinically able to do so. Bibasilar segmental atelectasis.  More focal consolidation within the basilar right middle lobe is nonspecific and may reflect a superimposed focal infectious or inflammatory process. Peripheral vascular disease. 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 Aortic Atherosclerosis (ICD10-I70.0). Electronically Signed   By: Fidela Salisbury M.D.   On: 10/07/2020 21:02   DG CHEST PORT 1 VIEW  Result Date: 10/11/2020 CLINICAL DATA:  Postop Port-A-Cath placement, colon cancer EXAM: PORTABLE CHEST 1 VIEW COMPARISON:  08/31/2020 FINDINGS: Single frontal view of the chest demonstrates left chest wall port via subclavian approach tip overlying the superior vena cava. The cardiac silhouette is unremarkable. No airspace disease, effusion, or pneumothorax. Linear areas of consolidation at the lung bases most compatible with subsegmental atelectasis. Postsurgical changes from thoracic fusion. IMPRESSION: 1. No complication after left-sided chest port placement. 2. Bibasilar subsegmental atelectasis. Electronically Signed   By: Randa Ngo M.D.   On: 10/11/2020 15:39   DG Abd 2 Views  Result Date: 10/07/2020 CLINICAL DATA:  75 year old male with history of recent gastrointestinal surgery. Postoperative ileus. EXAM: ABDOMEN - 2 VIEW COMPARISON:  Abdominal radiograph 10/02/2020. FINDINGS: Stomach is not distended. There is a paucity of colonic gas, however, there is some gas in the splenic flexure of the colon. Numerous markedly dilated loops of gas-filled small bowel are noted throughout the central abdomen, which demonstrate multiple air-fluid levels on the upright projection. Loops are dilated up to 4.7 cm in diameter, substantially worsened compared to the prior study. Midline skin staples are noted in the lower pelvis. Surgical drain extending into the low anatomic pelvis. No definite pneumoperitoneum. Status post right hip arthroplasty. IMPRESSION: 1. Worsening postoperative ileus, as above. Electronically  Signed   By: Vinnie Langton M.D.   On: 10/07/2020 13:55   DG C-Arm 1-60 Min-No Report  Result Date:  10/11/2020 Fluoroscopy was utilized by the requesting physician.  No radiographic interpretation.    Assessment/Plan  1. Prostate cancer [facility]) -Had radiation therapy with last session on 10/14, follows up with Dr. Irene Limbo  2. Protein-calorie malnutrition, severe Wt Readings from Last 3 Encounters:  10/28/20 148 lb 2.4 oz (67.2 kg)  09/20/20 155 lb 12.1 oz (70.7 kg)  09/15/20 157 lb 4.8 oz (71.4 kg)   Body mass index is 21.26 kg/m. -  albumin 2.7, 10/16/2020 -   PEG tube was placed for Osmolite 1.5 feedings -   Continue Prostat  3. Adenocarcinoma of sigmoid colon [facility]) -    Was contained by a surgical biopsy, s/p Hartman's procedure with end colostomy and peg tube placement on 9/16 -   Port-A-Cath placed on 9/27 -   Medical oncology plans to have liver lesion biopsy in the future  4. 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 Bone metastases ([place]) -    Was started on IV steroids and underwent T5 and T6 laminectomy for resection of extradural tumor, posterior segmental instrumentation from T4-T8 with globus titanium pedicle screws and rods, posterior lateral arthrodesis at C4-5, T5-6, T6-7, T7-8 with local morselized autograft bone and Zimmer DBM -    Completed palliative radiation x14 days  6. Anemia due to chronic blood loss Lab Results  Component Value Date   HGB 9.5 (L) 10/24/2020   -   Continue ferrous sulfate 300 mg / 5 mL daily  7.  Generalized weakness -   For PT and OT, for therapeutic strengthening exercises     Family/ staff Communication:   Discussed plan of care with resident and charge nurse.  Labs/tests ordered: None  Goals of care:   Short-term care   Marcus Age,  DNP, MSN, FNP-BC [facility] [phone] (Monday-Friday 8:00 a.m. - 5:00 p.m.) [phone] (after hours)

## 2020-11-01 ENCOUNTER — Ambulatory Visit: Payer: Medicare Other

## 2020-11-01 ENCOUNTER — Non-Acute Institutional Stay (SKILLED_NURSING_FACILITY): Payer: Medicare Other | Admitting: Internal Medicine

## 2020-11-01 ENCOUNTER — Encounter: Payer: Self-pay | Admitting: Internal Medicine

## 2020-11-01 DIAGNOSIS — E43 Unspecified severe protein-calorie malnutrition: Secondary | ICD-10-CM | POA: Diagnosis not present

## 2020-11-01 DIAGNOSIS — C187 Malignant neoplasm of sigmoid colon: Secondary | ICD-10-CM | POA: Diagnosis not present

## 2020-11-01 DIAGNOSIS — Z7189 Other specified counseling: Secondary | ICD-10-CM

## 2020-11-01 DIAGNOSIS — C154 Malignant neoplasm of middle third of esophagus: Secondary | ICD-10-CM | POA: Diagnosis not present

## 2020-11-01 DIAGNOSIS — C61 Malignant neoplasm of prostate: Secondary | ICD-10-CM | POA: Diagnosis not present

## 2020-11-01 NOTE — Assessment & Plan Note (Signed)
Oncology plans for systemic chemotherapy as an outpatient.

## 2020-11-01 NOTE — Progress Notes (Signed)
NURSING HOME LOCATION:  [facility] ROOM NUMBER:  108  CODE STATUS:  Full Code PCP:  Keith Rake MD  This is a comprehensive admission note to this SNFperformed on this date less than 30 days from date of admission. Included are preadmission medical/surgical history; reconciled medication list; family history; social history and comprehensive review of systems.  Corrections and additions to the records were documented. Comprehensive physical exam was also performed. Additionally a clinical summary was entered for each active diagnosis pertinent to this admission in the Problem List to enhance continuity of care.  HPI: He was hospitalized 9/11 - 10/28/2020 to address with symptoms which proved to be due to multiple malignancies with mets.   He has a history of prostate cancer with bony metastases for which he had a radical prostatectomy and radiation in 2015.  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 Arnoldo Morale, NS on 09/18/2020.  He returned to the ED 9/11 with intractable back pain, anorexia, and melena. He reports the intractable thoracic back pain since the surgery 9/4, described as sharp and constant.  Pain level is up to 10 out of 10.  IV morphine in the ED reduce the pain to 8 out of 10.  Oxycodone provided less pain relief.  Pain was worse with position change of the thorax.  Since the back surgery he had had poor oral intake which prompted his roommates to call EMS.  He described black stool without hematochezia.  He has no history of having had colonoscopy or EGD.  He had been on 2 tablets of ibuprofen since his surgery; he was not on anticoagulant.  He also describes lightheadedness which has been present even prior to the September surgery.  Following that surgery had been discharged with home health and a rolling walker but as of readmission  9/11 home health services have not been initiated. On 9/12 Dr. 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  Possible subsequent biopsy of liver lesion in the future was discussed.  He received 14 days of palliative radiation to the esophagus. Course was complicated by ileus which subsequently resolved.  Also he had hyponatremia which was repleted. He had received IV steroids for the bony mets and Dr. Irene Limbo had initiated palliative radiation therapy which was to be completed 10/14.  Flex sig 9/14 by Dr. 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  0 out of 7 positive lymph nodes.  Hartman's procedure with end colostomy and G-tube placement was completed on 9/14  for severe malnutrition.  Subsequently Port-A-Cath was placed 9/27. Oncology plans systemic chemotherapy as an outpatient. He was discharged to SNF for rehab.  General surgery follow-up with Dr. Greer Pickerel is scheduled for 10/21.  Past medical and surgical history: In addition to diagnoses delineated above; he has medical diagnoses of essential hypertension and Paget's disease of the pelvis.  He has a history of having received transfusions as a child for unknown reasons. In addition to the extensive surgery as above; he had lymphadenectomy at the time of the diagnosis of prostate cancer 2015 via robot-assisted laparoscopy. He also had a THA in 2018 and ganglion removed from the wrist.  Social history: He lives with 2 friends.  He drinks a case of beer per week and smokes a pack every 2 weeks.  He worked for [employer] of [place] for  most of his adult working life, eventually becoming a driver.  He states that he plans to relocate to [place] to live with his brother following rehab and completion of chemotherapy.  Family history: Limited history reviewed.   Review of systems: He has a general understanding of what transpired to the hospital.  He stated that he had  "cancer and radiation for 2 weeks but more cancer (found) but I forget where... in low back, bowels, or somewhere".  He states that he has fairly constant back pain but also has pain in the upper chest as well.  The majority the pain he states is from "my butt to the shoulder blades like flames in a fire".  He does state that two 600 mg Tylenol helps him go to sleep and relieve the pain.  Other than that he states that he is "fine without nothing else".  Constitutional: No fever Eyes: No redness, discharge, pain, vision change ENT/mouth: No nasal congestion, purulent discharge, earache, change in hearing, sore throat  Cardiovascular: No  palpitations, paroxysmal nocturnal dyspnea, claudication, edema  Respiratory: No cough, sputum production, hemoptysis, DOE Gastrointestinal: No heartburn, dysphagia,  nausea /vomiting, rectal bleeding Genitourinary: No dysuria, hematuria, pyuria, incontinence, nocturia Dermatologic: No rash, pruritus, change in appearance of skin Neurologic: No dizziness, headache, syncope, seizures, numbness, tingling Psychiatric: No significant insomnia, anorexia. He must stop eating if ostomy bag is full. Endocrine: No change in hair/skin/nails, excessive thirst, excessive hunger, excessive urination  Hematologic/lymphatic: No significant bruising, lymphadenopathy, abnormal bleeding  Physical exam:  Pertinent or positive findings: He appears somewhat chronically ill.  Alopecia is present.  His beard and mustache are unkempt.  He has dense arcus senilis.  He is edentulous except for 6 mandibular teeth.  Port-A-Cath is present @ the left chest.  He has low-grade rhonchi anteriorly greater on the right than the left.  Ostomy bag is present.  Posterior tibial pulses are stronger than dorsalis pedis pulses.  General appearance:  no acute distress, increased work of breathing is present.   Lymphatic: No lymphadenopathy about the head, neck, axilla. Eyes: No conjunctival inflammation  or lid edema is present. There is no scleral icterus. Ears:  External ear exam shows no significant lesions or deformities.   Nose:  External nasal examination shows no deformity or inflammation. Nasal mucosa are pink and moist without lesions, exudates Neck:  No thyromegaly, masses, tenderness noted.    Heart:  Normal rate and regular rhythm. S1 and S2 normal without gallop, murmur, click, rub.  Lungs:  without wheezes,rales, rubs. Abdomen: Bowel sounds are normal.  Abdomen is soft and nontender with no organomegaly, hernias, masses. GU: Deferred  Extremities:  No cyanosis, clubbing, edema. Skin: Warm & dry w/o tenting. No significant lesions or rash.  See clinical summary under each active problem in the Problem List with associated updated therapeutic plan

## 2020-11-01 NOTE — Assessment & Plan Note (Signed)
Patient remains a full code despite the presence of 3 malignancies.  Prognosis long-term is basically nonexistent but the patient does not comprehend this.  He plans to relocate to [place] to live with his brother after chemotherapy is completed. CODE STATUS will be revisited as he is receptive.

## 2020-11-01 NOTE — Assessment & Plan Note (Signed)
Presently he indicates Tylenol is controlling the pain.  Opioids will be initiated & titrated as clinically indicated.

## 2020-11-01 NOTE — Assessment & Plan Note (Signed)
No dysphagia; able to eat unless ostomy bag full

## 2020-11-01 NOTE — Assessment & Plan Note (Signed)
Nutrition consult @ SNF

## 2020-11-01 NOTE — Patient Instructions (Signed)
See assessment and plan under each diagnosis in the problem list and acutely for this visit 

## 2020-11-02 ENCOUNTER — Other Ambulatory Visit: Payer: Self-pay | Admitting: Adult Health

## 2020-11-02 ENCOUNTER — Encounter: Payer: Self-pay | Admitting: Internal Medicine

## 2020-11-02 MED ORDER — TRAMADOL HCL 50 MG PO TABS: 50.0000 mg | ORAL_TABLET | Freq: Three times a day (TID) | ORAL | 0 refills | Status: DC | PRN

## 2020-11-04 LAB — BASIC METABOLIC PANEL
BUN: 20 (ref 4–21)
CO2: 24 — AB (ref 13–22)
Chloride: 100 (ref 99–108)
Creatinine: 0.7 (ref 0.6–1.3)
Glucose: 119
Potassium: 4.6 (ref 3.4–5.3)
Sodium: 137 (ref 137–147)

## 2020-11-04 LAB — CBC AND DIFFERENTIAL
HCT: 31 — AB (ref 41–53)
Hemoglobin: 10.3 — AB (ref 13.5–17.5)
Neutrophils Absolute: 2.4
Platelets: 220 (ref 150–399)
WBC: 4.9

## 2020-11-04 LAB — COMPREHENSIVE METABOLIC PANEL
Calcium: 9.3 (ref 8.7–10.7)
GFR calc Af Amer: >90
GFR calc non Af Amer: >90

## 2020-11-04 LAB — CBC: RBC: 3.99 (ref 3.87–5.11)

## 2020-11-07 ENCOUNTER — Non-Acute Institutional Stay (SKILLED_NURSING_FACILITY): Payer: Medicare Other | Admitting: Adult Health

## 2020-11-07 ENCOUNTER — Other Ambulatory Visit: Payer: Self-pay

## 2020-11-07 ENCOUNTER — Encounter: Payer: Self-pay | Admitting: Adult Health

## 2020-11-07 DIAGNOSIS — E43 Unspecified severe protein-calorie malnutrition: Secondary | ICD-10-CM | POA: Diagnosis not present

## 2020-11-07 DIAGNOSIS — C61 Malignant neoplasm of prostate: Secondary | ICD-10-CM | POA: Diagnosis not present

## 2020-11-07 DIAGNOSIS — C187 Malignant neoplasm of sigmoid colon: Secondary | ICD-10-CM

## 2020-11-07 DIAGNOSIS — C154 Malignant neoplasm of middle third of esophagus: Secondary | ICD-10-CM | POA: Diagnosis not present

## 2020-11-07 DIAGNOSIS — D500 Iron deficiency anemia secondary to blood loss (chronic): Secondary | ICD-10-CM | POA: Diagnosis not present

## 2020-11-07 DIAGNOSIS — R531 Weakness: Secondary | ICD-10-CM

## 2020-11-07 DIAGNOSIS — C7951 Secondary malignant neoplasm of bone: Secondary | ICD-10-CM

## 2020-11-07 NOTE — Progress Notes (Signed)
Location:  Portal Room Number: 108 A Place of Service:  SNF (31) Provider:  Durenda Age, DNP, FNP-BC  Patient Care Team: [name], MD as PCP - General (Family Medicine)  Extended Emergency Contact Information Primary Emergency Contact: perry,albert (*Brother in law) Mobile Phone: [phone] Relation: Relative Secondary Emergency Contact: Virginia,Robert Relation: Brother Interpreter needed? No  Code Status:  Full Code  Goals of care: Advanced Directive information Advanced Directives 10/31/2020  Does Patient Have a Medical Advance Directive? Yes  Type of Paramedic of [place];Living will  Does patient want to make changes to medical advance directive? No - Patient declined  Copy of [place] in Chart? Yes - validated most recent copy scanned in chart (See row information)  Would patient like information on creating a medical advance directive? -     Chief Complaint  Patient presents with   Acute Visit    Short-term care visit    HPI:  Pt is a 75 y.o. male seen today for medical management of chronic diseases. He is currently having short-term rehabilitation at [facility]. He was seen in the room with brother-in-law at bedside.  He takes ferrous sulfate 300 mg / 5 mL daily for anemia.  Latest hemoglobin 10.3, 11/04/2020.  He follows up with [facility] and for his prostate cancer, adenocarcinoma of sigmoid colon and cancer of the esophagus, has bone metastases.   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    Prostate cancer ([place]) 09/04/13   Adenocarcinoma   Past Surgical History:  Procedure Laterality Date   BIOPSY  09/26/2020   Procedure: BIOPSY;  Surgeon: Ronnette Juniper, MD;  Location:  [facility];  Service: Gastroenterology;;   BIOPSY  09/28/2020   Procedure: BIOPSY;  Surgeon: Ronnette Juniper, MD;  Location: [facility];  Service: Gastroenterology;;   COLON RESECTION N/A 09/30/2020   Procedure: HAND ASSISTED LAPAROSCOPIC HARTMAN'S PROCEDURE WITH END COLOSTOMY; TAP BLOCK, PLACEMENT OF PEG TUBE;  Surgeon: Greer Pickerel, MD;  Location: [facility];  Service: General;  Laterality: N/A;   ESOPHAGOGASTRODUODENOSCOPY (EGD) WITH PROPOFOL N/A 09/26/2020   Procedure: ESOPHAGOGASTRODUODENOSCOPY (EGD) WITH PROPOFOL;  Surgeon: Ronnette Juniper, MD;  Location: [facility];  Service: Gastroenterology;  Laterality: N/A;   FLEXIBLE SIGMOIDOSCOPY N/A 09/28/2020   Procedure: FLEXIBLE SIGMOIDOSCOPY;  Surgeon: Ronnette Juniper, MD;  Location: [facility];  Service: Gastroenterology;  Laterality: N/A;   LAMINECTOMY N/A 09/18/2020   Procedure: THORACIC LAMINECTOMY FOR TUMOR;  Surgeon: Newman Pies, MD;  Location: [facility];  Service: Neurosurgery;  Laterality: N/A;   LYMPHADENECTOMY Bilateral 09/04/2013   Procedure: PELVIC LYMPH NODE DISSECTION;  Surgeon: Alexis Frock, MD;  Location: [facility];  Service: Urology;  Laterality: Bilateral;   PORTACATH PLACEMENT N/A 10/11/2020   Procedure: INSERTION PORT-A-CATH;  Surgeon: Armandina Gemma, MD;  Location: [facility];  Service: General;  Laterality: N/A;  60 MINUTES ROOM 4   ROBOT ASSISTED LAPAROSCOPIC RADICAL PROSTATECTOMY N/A 09/04/2013   Procedure: ROBOTIC ASSISTED LAPAROSCOPIC RADICAL PROSTATECTOMY, INDOCYANINE GREEN DYE;  Surgeon: Alexis Frock, MD;  Location: [facility];  Service: Urology;  Laterality: N/A;   SUBMUCOSAL TATTOO INJECTION  09/28/2020   Procedure: SUBMUCOSAL TATTOO INJECTION;  Surgeon: Ronnette Juniper, MD;  [facility]: [facility];  Service:  Gastroenterology;;   TOTAL HIP ARTHROPLASTY Right 05/15/2016   Procedure: RIGHT TOTAL HIP ARTHROPLASTY ANTERIOR APPROACH;  Surgeon: Paralee Cancel, MD;  Location: [facility];  Service: Orthopedics;  Laterality: Right;   WRIST GANGLION EXCISION       No Known Allergies  Outpatient Encounter Medications as of 11/07/2020  Medication Sig   acetaminophen (TYLENOL) 160 MG/5ML solution Place 20.3 mLs (650 mg total) into feeding tube every 6 (six) hours as needed for mild pain.   Amino Acids-Protein Hydrolys (PRO-STAT MAX) LIQD Take by mouth. Give 45 ml per g/t twice a day for supplement   ferrous sulfate 300 (60 Fe) MG/5ML syrup Place 5 mLs (300 mg total) into feeding tube daily.   Multiple Vitamin (MULTIVITAMIN) LIQD Place 10 mLs into feeding tube daily.   Nutritional Supplements (FEEDING SUPPLEMENT, OSMOLITE 1.5 CAL,) LIQD Place 474 mLs into feeding tube 3 (three) times daily after meals.   Nutritional Supplements (FEEDING SUPPLEMENT, PROSOURCE TF,) liquid Place 45 mLs into feeding tube 2 (two) times daily.   traMADol (ULTRAM) 50 MG tablet Take 1 tablet (50 mg total) by mouth every 8 (eight) hours as needed.   Water For Irrigation, Sterile (FREE WATER) SOLN Place 100 mLs into feeding tube every 8 (eight) hours. 50 ml free water flush before and after each TF bolus given   No facility-administered encounter medications on file as of 11/07/2020.    Review of Systems  GENERAL: No fever or chills  MOUTH and THROAT: Denies oral discomfort, gingival pain or bleeding RESPIRATORY: no cough, SOB, DOE, wheezing, hemoptysis CARDIAC: No chest pain, edema or palpitations GI: No abdominal pain, diarrhea, constipation, heart burn, nausea or vomiting GU: Denies dysuria, frequency, hematuria, incontinence, or discharge NEUROLOGICAL: Denies dizziness, syncope, numbness, or headache PSYCHIATRIC: Denies feelings of depression or anxiety. No report of hallucinations, insomnia, paranoia, or agitation   Immunization History  Administered Date(s) Administered   Ambulance person Booster 35yrs & up 10/15/2020   Pneumococcal Polysaccharide-23 09/05/2013   Pertinent  Health Maintenance Due  Topic Date Due   COLONOSCOPY (Pts 45-21yrs  Insurance coverage will need to be confirmed)  Never done   INFLUENZA VACCINE  Never done   Fall Risk  06/07/2014 04/27/2014 02/16/2014  Falls in the past year? No No No     Vitals:   11/07/20 1000  BP: 113/71  Pulse: 97  Resp: 18  Temp: 97.9 F (36.6 C)  Weight: 138 lb 6.4 oz (62.8 kg)  Height: 5\' 10"  (1.778 m)   Body mass index is 19.86 kg/m.  Physical Exam  GENERAL APPEARANCE: In no acute distress.  SKIN:  Skin is warm and dry.  MOUTH and THROAT: Lips are without lesions. Oral mucosa is moist and without lesions.  RESPIRATORY: Breathing is even & unlabored, BS CTAB CARDIAC: RRR, no murmur,no extra heart sounds, no edema,  GI:Has colostomy and peg tube NEUROLOGICAL: There is no tremor. Speech is clear. Alert and oriented X 3. PSYCHIATRIC:  Affect and behavior are appropriate  Labs reviewed: Recent Labs    10/18/20 0503 10/18/20 1551 10/19/20 0507 10/19/20 1459 10/19/20 1524 10/21/20 1536 10/22/20 1138 10/23/20 1829 10/25/20 0440  NA 137  --   --    < >  --  131* 133* 132*  --   K 4.4  --   --    < >  --  3.9 4.1 4.1  --   CL 107  --   --    < >  --  97* 99 99  --   CO2 23  --   --    < >  --  26 28 27   --   GLUCOSE 111*  --   --    < >  --  106* 173* 145*  --   BUN 9  --   --    < >  --  17 17 18   --   CREATININE 0.63  --   --    < >  --  0.74 0.81 0.71 0.60*  CALCIUM 8.9  --   --    < >  --  8.9 9.1 9.0  --   MG 1.8 2.2 2.2  --  1.9  --   --   --   --   PHOS 3.1 3.2 4.3  --   --   --   --   --   --    < > = values in this interval not displayed.   Recent Labs    10/09/20 0508 10/10/20 0436 10/16/20 0527  AST 14* 13* 13*  ALT 29 22 10   ALKPHOS 153* 129* 114  BILITOT 1.0 0.8 0.4  PROT 5.7* 5.5* 6.2*  ALBUMIN 2.5* 2.4* 2.7*   Recent Labs    09/25/20 1304 09/25/20 1554 10/09/20 0508 10/10/20 0436 10/11/20 1641 10/17/20 1741 10/18/20 0503 10/24/20 1605  WBC 8.2   < > 3.8* 3.8*   < > 5.8 5.4 4.4  NEUTROABS 6.4  --  2.0 2.2  --   --   --   --    HGB 9.9*   < > 8.4* 8.2*   < > 9.0* 8.9* 9.5*  HCT 31.4*   < > 26.6* 25.8*   < > 28.6* 28.3* 29.5*  MCV 80.5   < > 77.3* 77.2*   < > 79.9* 78.4* 80.2  PLT 187   < > 364 371   < > 397 327 273   < > = values in this interval not displayed.   No results found for: TSH Lab Results  Component Value Date   HGBA1C 6.5 (H) 10/02/2020   Lab Results  Component Value Date   CHOL 197 07/14/2009   HDL 55 07/14/2009   LDLCALC 130 (H) 07/14/2009   TRIG 62 07/14/2009   CHOLHDL 3.6 Ratio 07/14/2009    Significant Diagnostic Results in last 30 days:  DG CHEST PORT 1 VIEW  Result Date: 10/11/2020 CLINICAL DATA:  Postop Port-A-Cath placement, colon cancer EXAM: PORTABLE CHEST 1 VIEW COMPARISON:  08/31/2020 FINDINGS: Single frontal view of the chest demonstrates left chest wall port via subclavian approach tip overlying the superior vena cava. The cardiac silhouette is unremarkable. No airspace disease, effusion, or pneumothorax. Linear areas of consolidation at the lung bases most compatible with subsegmental atelectasis. Postsurgical changes from thoracic fusion. IMPRESSION: 1. No complication after left-sided chest port placement. 2. Bibasilar subsegmental atelectasis. Electronically Signed   By: Randa Ngo M.D.   On: 10/11/2020 15:39   DG C-Arm 1-60 Min-No Report  Result Date: 10/11/2020 Fluoroscopy was utilized by the requesting physician.  No radiographic interpretation.    Assessment/Plan  1. Anemia due to chronic blood loss -  hgb 10.3, continue ferrous sulfate  2. Protein-calorie malnutrition, severe  Wt Readings from Last 3 Encounters:     11/07/20 138 lb 6.4 oz (62.8 kg)  10/31/20 138 lb 6.4 oz (62.8 kg)   -   Continue supplementation with Magic cup and Osmolite 1.5 cal  3. 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 Generalized weakness -   Continue PT and OT, for therapeutic strengthening exercises   Family/ staff Communication:   Discussed plan of care with resident and charge nurse.  Labs/tests ordered: None  Goals of care:   Short-term care   Durenda Age, DNP, MSN, FNP-BC [facility] and Adult [facility] [phone] (Monday-Friday 8:00 a.m. - 5:00 p.m.) [phone] (after hours)

## 2020-11-08 ENCOUNTER — Other Ambulatory Visit: Payer: Self-pay

## 2020-11-08 ENCOUNTER — Inpatient Hospital Stay: Payer: Medicare Other | Attending: Hematology | Admitting: Hematology

## 2020-11-08 ENCOUNTER — Inpatient Hospital Stay: Payer: Medicare Other

## 2020-11-08 VITALS — BP 114/75 | HR 112 | Temp 97.7°F | Resp 17 | Ht 70.0 in | Wt 136.7 lb

## 2020-11-08 DIAGNOSIS — Z8546 Personal history of malignant neoplasm of prostate: Secondary | ICD-10-CM | POA: Insufficient documentation

## 2020-11-08 DIAGNOSIS — K922 Gastrointestinal hemorrhage, unspecified: Secondary | ICD-10-CM | POA: Insufficient documentation

## 2020-11-08 DIAGNOSIS — F17210 Nicotine dependence, cigarettes, uncomplicated: Secondary | ICD-10-CM | POA: Insufficient documentation

## 2020-11-08 DIAGNOSIS — C7951 Secondary malignant neoplasm of bone: Secondary | ICD-10-CM

## 2020-11-08 DIAGNOSIS — C154 Malignant neoplasm of middle third of esophagus: Secondary | ICD-10-CM | POA: Diagnosis not present

## 2020-11-08 DIAGNOSIS — C187 Malignant neoplasm of sigmoid colon: Secondary | ICD-10-CM | POA: Diagnosis not present

## 2020-11-08 DIAGNOSIS — C61 Malignant neoplasm of prostate: Secondary | ICD-10-CM | POA: Diagnosis not present

## 2020-11-08 DIAGNOSIS — C159 Malignant neoplasm of esophagus, unspecified: Secondary | ICD-10-CM | POA: Insufficient documentation

## 2020-11-08 DIAGNOSIS — D500 Iron deficiency anemia secondary to blood loss (chronic): Secondary | ICD-10-CM | POA: Diagnosis not present

## 2020-11-08 LAB — CBC WITH DIFFERENTIAL (CANCER CENTER ONLY)
Abs Immature Granulocytes: 0.05 10*3/uL (ref 0.00–0.07)
Basophils Absolute: 0 10*3/uL (ref 0.0–0.1)
Basophils Relative: 0 %
Eosinophils Absolute: 1.1 10*3/uL — ABNORMAL HIGH (ref 0.0–0.5)
Eosinophils Relative: 19 %
HCT: 35.8 % — ABNORMAL LOW (ref 39.0–52.0)
Hemoglobin: 11.3 g/dL — ABNORMAL LOW (ref 13.0–17.0)
Immature Granulocytes: 1 %
Lymphocytes Relative: 15 %
Lymphs Abs: 0.9 10*3/uL (ref 0.7–4.0)
MCH: 24.9 pg — ABNORMAL LOW (ref 26.0–34.0)
MCHC: 31.6 g/dL (ref 30.0–36.0)
MCV: 79 fL — ABNORMAL LOW (ref 80.0–100.0)
Monocytes Absolute: 0.6 10*3/uL (ref 0.1–1.0)
Monocytes Relative: 10 %
Neutro Abs: 3.1 10*3/uL (ref 1.7–7.7)
Neutrophils Relative %: 55 %
Platelet Count: 217 10*3/uL (ref 150–400)
RBC: 4.53 MIL/uL (ref 4.22–5.81)
RDW: 19 % — ABNORMAL HIGH (ref 11.5–15.5)
WBC Count: 5.6 10*3/uL (ref 4.0–10.5)
nRBC: 0 % (ref 0.0–0.2)

## 2020-11-08 LAB — CMP (CANCER CENTER ONLY)
ALT: 74 U/L — ABNORMAL HIGH (ref 0–44)
AST: 51 U/L — ABNORMAL HIGH (ref 15–41)
Albumin: 3.3 g/dL — ABNORMAL LOW (ref 3.5–5.0)
Alkaline Phosphatase: 148 U/L — ABNORMAL HIGH (ref 38–126)
Anion gap: 11 (ref 5–15)
BUN: 15 mg/dL (ref 8–23)
CO2: 25 mmol/L (ref 22–32)
Calcium: 9.7 mg/dL (ref 8.9–10.3)
Chloride: 99 mmol/L (ref 98–111)
Creatinine: 0.94 mg/dL (ref 0.61–1.24)
GFR, Estimated: >60 mL/min (ref >60)
Glucose, Bld: 209 mg/dL — ABNORMAL HIGH (ref 70–99)
Potassium: 3.8 mmol/L (ref 3.5–5.1)
Sodium: 135 mmol/L (ref 135–145)
Total Bilirubin: 0.6 mg/dL (ref 0.3–1.2)
Total Protein: 8 g/dL (ref 6.5–8.1)

## 2020-11-08 LAB — IRON AND TIBC
Iron: 31 ug/dL — ABNORMAL LOW (ref 42–163)
Saturation Ratios: 12 % — ABNORMAL LOW (ref 20–55)
TIBC: 256 ug/dL (ref 202–409)
UIBC: 225 ug/dL (ref 117–376)

## 2020-11-08 LAB — SAMPLE TO BLOOD BANK

## 2020-11-08 LAB — FERRITIN: Ferritin: 497 ng/mL — ABNORMAL HIGH (ref 24–336)

## 2020-11-08 MED ORDER — OXYCODONE HCL 5 MG/5ML PO SOLN: 5.0000 mg | ORAL | 0 refills | Status: DC | PRN

## 2020-11-08 NOTE — Progress Notes (Addendum)
HEMATOLOGY-ONCOLOGY PROGRESS NOTE  DOS .11/08/2020  SUBJECTIVE: Marcus [name] is here for posthospitalization follow-up as a new clinic patient for his recently diagnosed metastatic prostate cancer, sigmoid colon cancer and esophageal cancer. He is recovering from surgery for bowel obstruction caused by his sigmoid colon cancer status post ostomy.  Has a feeding tube and is undergoing tube feeding. He has completed his palliative radiation to the esophageal mass.  Notes some throat pain while swallowing due to radiation esophagitis. Notes significant fatigue.  Currently at a skilled nursing facility.  Notes he has no place to return to in [place] since he has given up his previous room which he shared with a roommate.  His brother-in-law lives here in town but his brother who is his closest family member lives in [place]. Patient notes that his brother is following him for [place] but that he feels he is too weak to travel by himself and does not have any medical care set up in [place] at this time.  He notes that he shall be requesting his brother to try to move to [place]. We discussed in detail again his goals of care.  He understands that his current functional status makes it difficult for him to tolerate palliative chemotherapy.  He is strongly considering hospice cares but is not finally decided at this time and would like to talk with his brother-in-law and brother to make a decision on where and what kind of care he would like to continue. Denies any rectal bleeding or any bleeding into the colostomy. No hematemesis.  REVIEW OF SYSTEMS:   .10 Point review of Systems was done is negative except as noted above.  I have reviewed the past medical history, past surgical history, social history and family history with the patient and they are unchanged from previous note.   PHYSICAL EXAMINATION: ECOG PERFORMANCE STATUS: 1 - Symptomatic but completely ambulatory  Vitals:   11/08/20 1103   BP: 114/75  Pulse: (!) 112  Resp: 17  Temp: 97.7 F (36.5 C)  SpO2: 97%   Filed Weights   11/08/20 1103  Weight: 136 lb 11.2 oz (62 kg)    Intake/Output from previous day: No intake/output data recorded.  Marland Kitchen GENERAL:alert, in no acute distress and comfortable SKIN: no acute rashes, no significant lesions EYES: conjunctiva are pink and non-injected, sclera anicteric OROPHARYNX: MMM, no exudates, no oropharyngeal erythema or ulceration NECK: supple, no JVD LYMPH:  no palpable lymphadenopathy in the cervical, axillary or inguinal regions LUNGS: clear to auscultation b/l with normal respiratory effort HEART: regular rate & rhythm ABDOMEN:  normoactive bowel sounds , non tender, ostomy and G-tube in situ Extremity: no pedal edema PSYCH: alert & oriented x 3 with fluent speech NEURO: no focal motor/sensory deficits   LABORATORY DATA:  I have reviewed the data as listed CMP Latest Ref Rng & Units 11/08/2020 10/25/2020 10/23/2020  Glucose 70 - 99 mg/dL 209(H) - 145(H)  BUN 8 - 23 mg/dL 15 - 18  Creatinine 0.61 - 1.24 mg/dL 0.94 0.60(L) 0.71  Sodium 135 - 145 mmol/L 135 - 132(L)  Potassium 3.5 - 5.1 mmol/L 3.8 - 4.1  Chloride 98 - 111 mmol/L 99 - 99  CO2 22 - 32 mmol/L 25 - 27  Calcium 8.9 - 10.3 mg/dL 9.7 - 9.0  Total Protein 6.5 - 8.1 g/dL 8.0 - -  Total Bilirubin 0.3 - 1.2 mg/dL 0.6 - -  Alkaline Phos 38 - 126 U/L 148(H) - -  AST 15 - 41 U/L  51(H) - -  ALT 0 - 44 U/L 74(H) - -   .CBC    Component Value Date/Time   WBC 5.6 11/08/2020 1018   WBC 4.4 10/24/2020 1605   RBC 4.53 11/08/2020 1018   HGB 11.3 (L) 11/08/2020 1018   HCT 35.8 (L) 11/08/2020 1018   PLT 217 11/08/2020 1018   MCV 79.0 (L) 11/08/2020 1018   MCH 24.9 (L) 11/08/2020 1018   MCHC 31.6 11/08/2020 1018   RDW 19.0 (H) 11/08/2020 1018   LYMPHSABS 0.9 11/08/2020 1018   MONOABS 0.6 11/08/2020 1018   EOSABS 1.1 (H) 11/08/2020 1018   BASOSABS 0.0 11/08/2020 1018     Lab Results  Component Value Date    WBC 5.6 11/08/2020   HGB 11.3 (L) 11/08/2020   HCT 35.8 (L) 11/08/2020   MCV 79.0 (L) 11/08/2020   PLT 217 11/08/2020   NEUTROABS 3.1 11/08/2020   . Lab Results  Component Value Date   IRON 31 (L) 11/08/2020   TIBC 256 11/08/2020   IRONPCTSAT 12 (L) 11/08/2020   (Iron and TIBC)  Lab Results  Component Value Date   FERRITIN 497 (H) 11/08/2020      DG CHEST PORT 1 VIEW  Result Date: 10/11/2020 CLINICAL DATA:  Postop Port-A-Cath placement, colon cancer EXAM: PORTABLE CHEST 1 VIEW COMPARISON:  08/31/2020 FINDINGS: Single frontal view of the chest demonstrates left chest wall port via subclavian approach tip overlying the superior vena cava. The cardiac silhouette is unremarkable. No airspace disease, effusion, or pneumothorax. Linear areas of consolidation at the lung bases most compatible with subsegmental atelectasis. Postsurgical changes from thoracic fusion. IMPRESSION: 1. No complication after left-sided chest port placement. 2. Bibasilar subsegmental atelectasis. Electronically Signed   By: Randa Ngo M.D.   On: 10/11/2020 15:39   DG C-Arm 1-60 Min-No Report  Result Date: 10/11/2020 Fluoroscopy was utilized by the requesting physician.  No radiographic interpretation.    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 Tresa Moore.  Had extracapsular extension and positive resection margins. -Status post postoperative radiation therapy by Dr. Tammi Klippel. -Lost to follow-up after treatment.   2) T5-T7 sclerotic bone lesion involving posterior elements with right-sided epidural tumor and T5-6 T6-7 foraminal infiltration. -09/15/2020 PSA was 28.7 -Spinal cord compression noted on MRI of the thoracic spine from 09/16/2020 at T5-T6 -09/18/2020 T5 and T6 laminectomy for  resection of extradural tumor -Pathology from 09/18/2020 consistent with metastatic prostate adenocarcinoma  3) poorly differentiated carcinoma of the esophagus diagnosed 09/26/2020 -tumor is positive with cytokeratin 5/6, p63, p40 and cytokeratin AE1/AE3.  The tumor is negative with CDX2, cytokeratin 7, cytokeratin 20, MOC31, prostein and prostate-specific antigen.  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 -Biopsy from 09/28/2020 showed a high-grade dysplasia arising in a tubular adenoma-biopsy may not be representative of the mass seen endoscopically -Status post laparoscopic-assisted Hartman's with G-tube placement on 09/30/2020  5) Extensive lymphadenopathy in the abdomen and hypodensities in the liver concerning for metastatic disease likely from metastatic esophageal cancer  6) microcytic  anemia secondary to GI bleed, B12 deficiency -Labs from 09/25/2020-ferritin 374, iron 14, TIBC 235, percent saturation 6%, folate 7.5, vitamin B12 140 -Iron sucrose 200 mg IV x3 doses started on 09/29/2020   7) active smoker half a pack per day 8) poor medical follow-up 9) family history of GI malignancy -Reported sister with colon malignancy 10) poor functional status PLAN: -Patient is recovering from his sigmoid colon surgery due to obstructive sigmoid colon lesion.  Currently has ostomy and a feeding tube. -Having some esophageal pain due to radiation done palliatively for his esophageal cancer.  Given liquid oxycodone for as needed. -Currently undergoing rehabilitation at skilled nursing facility. -We had a detailed discussion regarding his goals of care and the fact of his poor  performance status makes it unlikely for him to tolerate palliative chemotherapy of this time.  He continues to have significant debility. -PSA levels have normalized at this time.  Holding off on using Lupron at this time depending on goals of care. -If the patient functionally improves and wants to pursue palliative chemotherapy would need to biopsy one of his liver lesions to determine if it does represent colonic cancer metastasis versus esophageal cancer metastases versus prostate cancer metastases. -Patient will engage in further discussion with his brother-in-law and his brother from [place] and call us with the decision.  We shall see him back in 2 weeks in clinic to finalize a treatment plan going ahead.   . The total time spent in the appointment was 40 minutes and more than 50% was on counseling and direct patient care, goals of care discussions.  Sullivan Lone MD MS Hematology/Oncology Physician [facility]

## 2020-11-09 ENCOUNTER — Telehealth: Payer: Self-pay | Admitting: Hematology

## 2020-11-09 LAB — PSA, TOTAL AND FREE
PSA, Free Pct: 41.6 %
PSA, Free: 1.33 ng/mL
Prostate Specific Ag, Serum: 3.2 ng/mL (ref 0.0–4.0)

## 2020-11-09 NOTE — Telephone Encounter (Signed)
Scheduled follow-up appointment per 10/25 los. Patient's brother is aware.

## 2020-11-19 ENCOUNTER — Other Ambulatory Visit: Payer: Self-pay | Admitting: Adult Health

## 2020-11-19 MED ORDER — OXYCODONE HCL 5 MG/5ML PO SOLN: 5.0000 mg | ORAL | 0 refills | Status: DC | PRN

## 2020-11-21 ENCOUNTER — Other Ambulatory Visit: Payer: Self-pay

## 2020-11-21 DIAGNOSIS — C61 Malignant neoplasm of prostate: Secondary | ICD-10-CM

## 2020-11-22 ENCOUNTER — Other Ambulatory Visit: Payer: Self-pay

## 2020-11-22 ENCOUNTER — Inpatient Hospital Stay: Payer: Medicare Other

## 2020-11-22 ENCOUNTER — Inpatient Hospital Stay: Payer: Medicare Other | Attending: Hematology | Admitting: Hematology

## 2020-11-22 VITALS — BP 132/94 | HR 95 | Temp 97.0°F | Resp 18 | Wt 134.3 lb

## 2020-11-22 DIAGNOSIS — C787 Secondary malignant neoplasm of liver and intrahepatic bile duct: Secondary | ICD-10-CM | POA: Diagnosis not present

## 2020-11-22 DIAGNOSIS — C187 Malignant neoplasm of sigmoid colon: Secondary | ICD-10-CM | POA: Insufficient documentation

## 2020-11-22 DIAGNOSIS — C154 Malignant neoplasm of middle third of esophagus: Secondary | ICD-10-CM

## 2020-11-22 DIAGNOSIS — K922 Gastrointestinal hemorrhage, unspecified: Secondary | ICD-10-CM | POA: Diagnosis not present

## 2020-11-22 DIAGNOSIS — Z800 Family history of malignant neoplasm of digestive organs: Secondary | ICD-10-CM | POA: Diagnosis not present

## 2020-11-22 DIAGNOSIS — Z933 Colostomy status: Secondary | ICD-10-CM | POA: Diagnosis not present

## 2020-11-22 DIAGNOSIS — C7951 Secondary malignant neoplasm of bone: Secondary | ICD-10-CM | POA: Insufficient documentation

## 2020-11-22 DIAGNOSIS — C61 Malignant neoplasm of prostate: Secondary | ICD-10-CM

## 2020-11-22 DIAGNOSIS — C7989 Secondary malignant neoplasm of other specified sites: Secondary | ICD-10-CM | POA: Diagnosis not present

## 2020-11-22 DIAGNOSIS — D500 Iron deficiency anemia secondary to blood loss (chronic): Secondary | ICD-10-CM | POA: Insufficient documentation

## 2020-11-22 DIAGNOSIS — F17210 Nicotine dependence, cigarettes, uncomplicated: Secondary | ICD-10-CM | POA: Insufficient documentation

## 2020-11-22 LAB — CMP (CANCER CENTER ONLY)
ALT: 39 U/L (ref 0–44)
AST: 33 U/L (ref 15–41)
Albumin: 3.6 g/dL (ref 3.5–5.0)
Alkaline Phosphatase: 131 U/L — ABNORMAL HIGH (ref 38–126)
Anion gap: 11 (ref 5–15)
BUN: 12 mg/dL (ref 8–23)
CO2: 25 mmol/L (ref 22–32)
Calcium: 9.9 mg/dL (ref 8.9–10.3)
Chloride: 99 mmol/L (ref 98–111)
Creatinine: 0.9 mg/dL (ref 0.61–1.24)
GFR, Estimated: >60 mL/min (ref >60)
Glucose, Bld: 131 mg/dL — ABNORMAL HIGH (ref 70–99)
Potassium: 4 mmol/L (ref 3.5–5.1)
Sodium: 135 mmol/L (ref 135–145)
Total Bilirubin: 0.3 mg/dL (ref 0.3–1.2)
Total Protein: 8.5 g/dL — ABNORMAL HIGH (ref 6.5–8.1)

## 2020-11-22 LAB — CBC WITH DIFFERENTIAL (CANCER CENTER ONLY)
Abs Immature Granulocytes: 0.01 10*3/uL (ref 0.00–0.07)
Basophils Absolute: 0 10*3/uL (ref 0.0–0.1)
Basophils Relative: 1 %
Eosinophils Absolute: 0.1 10*3/uL (ref 0.0–0.5)
Eosinophils Relative: 3 %
HCT: 36.5 % — ABNORMAL LOW (ref 39.0–52.0)
Hemoglobin: 11.7 g/dL — ABNORMAL LOW (ref 13.0–17.0)
Immature Granulocytes: 0 %
Lymphocytes Relative: 22 %
Lymphs Abs: 0.9 10*3/uL (ref 0.7–4.0)
MCH: 24.7 pg — ABNORMAL LOW (ref 26.0–34.0)
MCHC: 32.1 g/dL (ref 30.0–36.0)
MCV: 77 fL — ABNORMAL LOW (ref 80.0–100.0)
Monocytes Absolute: 0.4 10*3/uL (ref 0.1–1.0)
Monocytes Relative: 10 %
Neutro Abs: 2.6 10*3/uL (ref 1.7–7.7)
Neutrophils Relative %: 64 %
Platelet Count: 248 10*3/uL (ref 150–400)
RBC: 4.74 MIL/uL (ref 4.22–5.81)
RDW: 17.9 % — ABNORMAL HIGH (ref 11.5–15.5)
WBC Count: 4 10*3/uL (ref 4.0–10.5)
nRBC: 0 % (ref 0.0–0.2)

## 2020-11-22 LAB — SAMPLE TO BLOOD BANK

## 2020-11-22 LAB — IRON AND TIBC
Iron: 47 ug/dL (ref 42–163)
Saturation Ratios: 17 % — ABNORMAL LOW (ref 20–55)
TIBC: 275 ug/dL (ref 202–409)
UIBC: 228 ug/dL (ref 117–376)

## 2020-11-22 LAB — FERRITIN: Ferritin: 471 ng/mL — ABNORMAL HIGH (ref 24–336)

## 2020-11-23 LAB — PSA, TOTAL AND FREE
PSA, Free Pct: 41.3 %
PSA, Free: 1.24 ng/mL
Prostate Specific Ag, Serum: 3 ng/mL (ref 0.0–4.0)

## 2020-11-25 ENCOUNTER — Encounter: Payer: Self-pay | Admitting: Adult Health

## 2020-11-25 ENCOUNTER — Non-Acute Institutional Stay (SKILLED_NURSING_FACILITY): Payer: Medicare Other | Admitting: Adult Health

## 2020-11-25 DIAGNOSIS — R1319 Other dysphagia: Secondary | ICD-10-CM

## 2020-11-25 DIAGNOSIS — C61 Malignant neoplasm of prostate: Secondary | ICD-10-CM

## 2020-11-25 DIAGNOSIS — E43 Unspecified severe protein-calorie malnutrition: Secondary | ICD-10-CM | POA: Diagnosis not present

## 2020-11-25 DIAGNOSIS — C187 Malignant neoplasm of sigmoid colon: Secondary | ICD-10-CM

## 2020-11-25 DIAGNOSIS — C154 Malignant neoplasm of middle third of esophagus: Secondary | ICD-10-CM

## 2020-11-25 DIAGNOSIS — C7951 Secondary malignant neoplasm of bone: Secondary | ICD-10-CM

## 2020-11-25 NOTE — Progress Notes (Signed)
Location:  [place] Room Number: 108-A Place of Service:  SNF (31) Provider:  Durenda Age, DNP, FNP-BC  Patient Care Team: [name], MD as PCP - General (Family Medicine)  Extended Emergency Contact Information Primary Emergency Contact: perry,albert (*Brother in law) Mobile Phone: [phone] Relation: Relative  Code Status: Full Code   Goals of care: Advanced Directive information Advanced Directives 11/30/2020  Does Patient Have a Medical Advance Directive? Yes  Type of Advance Directive Living will  Does patient want to make changes to medical advance directive? No - Patient declined  Copy of [place] in Chart? -  Would patient like information on creating a medical advance directive? -     Chief Complaint  Patient presents with   Acute Visit    Short term rehabilitation     HPI:  Pt is a 75 y.o. male seen today for short-term rehabilitation visit. He was seen in his room today. He was smiling but verbalized having abdominal pain 5/10/ He is currently taking Oxycodone 5 mg/5 ml every 4 hours PRN. He s follows up with oncology for prostate cancer, sigmoid colon adenocarcinoma and esophageal cancer with liver metastasis and upper abdominal lymphadenopathy. Referral has been made for hospice per cancer center recommendation.   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    Prostate cancer ([place]) 09/04/13   Adenocarcinoma   Past Surgical History:  Procedure Laterality Date   BIOPSY  09/26/2020   Procedure: BIOPSY;  Surgeon: Ronnette Juniper, MD;  Location: [facility];  Service: Gastroenterology;;   BIOPSY  09/28/2020   Procedure: BIOPSY;  Surgeon: Ronnette Juniper, MD;  Location: [facility];  Service: Gastroenterology;;   COLON RESECTION N/A  09/30/2020   Procedure: HAND ASSISTED LAPAROSCOPIC HARTMAN'S PROCEDURE WITH END COLOSTOMY; TAP BLOCK, PLACEMENT OF PEG TUBE;  Surgeon: Greer Pickerel, MD;  Location: [facility];  Service: General;  Laterality: N/A;   ESOPHAGOGASTRODUODENOSCOPY (EGD) WITH PROPOFOL N/A 09/26/2020   Procedure: ESOPHAGOGASTRODUODENOSCOPY (EGD) WITH PROPOFOL;  Surgeon: Ronnette Juniper, MD;  Location: [facility];  Service: Gastroenterology;  Laterality: N/A;   FLEXIBLE SIGMOIDOSCOPY N/A 09/28/2020   Procedure: FLEXIBLE SIGMOIDOSCOPY;  Surgeon: Ronnette Juniper, MD;  Location: [facility];  Service: Gastroenterology;  Laterality: N/A;   LAMINECTOMY N/A 09/18/2020   Procedure: THORACIC LAMINECTOMY FOR TUMOR;  Surgeon: Newman Pies, MD;  Location: [facility];  Service: Neurosurgery;  Laterality: N/A;   LYMPHADENECTOMY Bilateral 09/04/2013   Procedure: PELVIC LYMPH NODE DISSECTION;  Surgeon: Alexis Frock, MD;  Location: [facility];  Service: Urology;  Laterality: Bilateral;   PORTACATH PLACEMENT N/A 10/11/2020   Procedure: INSERTION PORT-A-CATH;  Surgeon: Armandina Gemma, MD;  Location: [facility];  Service: General;  Laterality: N/A;  60 MINUTES ROOM 4   ROBOT ASSISTED LAPAROSCOPIC RADICAL PROSTATECTOMY N/A 09/04/2013   Procedure: ROBOTIC ASSISTED LAPAROSCOPIC RADICAL PROSTATECTOMY, INDOCYANINE GREEN DYE;  Surgeon: Alexis Frock, MD;  Location: [facility];  Service: Urology;  Laterality: N/A;   SUBMUCOSAL TATTOO INJECTION  09/28/2020   Procedure: SUBMUCOSAL TATTOO INJECTION;  Surgeon: Ronnette Juniper, MD;  Location: [facility];  Service: Gastroenterology;;   TOTAL HIP ARTHROPLASTY Right 05/15/2016   Procedure: RIGHT TOTAL HIP ARTHROPLASTY ANTERIOR APPROACH;  Surgeon: Paralee Cancel, MD;  Location: [facility];  Service: Orthopedics;  Laterality: Right;   WRIST GANGLION EXCISION      No Known Allergies  Outpatient Encounter Medications as of 11/25/2020  Medication Sig   acetaminophen (TYLENOL) 160 MG/5ML solution Place 20.3 mLs (650 mg total) into feeding tube  every 6 (six) hours as needed for mild pain.   Amino Acids-Protein Hydrolys (PRO-STAT MAX) LIQD Take by mouth. Give 45 ml per g/t twice a day for supplement   ferrous sulfate 300 (60 Fe) MG/5ML syrup Place 5 mLs (300 mg total) into feeding tube daily.   Multiple Vitamin (MULTIVITAMIN) LIQD Place 10 mLs into feeding tube daily.   Nutritional Supplements (FEEDING SUPPLEMENT, OSMOLITE 1.5 CAL,) LIQD 1 CARTON 5 TIMES PER DAY WITH 50ML BEFORE AND AFTER EACH BOLUS.   Nutritional Supplements (FEEDING SUPPLEMENT, PROSOURCE TF,) liquid Place 45 mLs into feeding tube 2 (two) times daily.   ondansetron (ZOFRAN) 4 MG tablet Give 1 tab per peg tube every 8 hours for nausea/vomiting   oxyCODONE (ROXICODONE) 5 MG/5ML solution Place 5 mLs (5 mg total) into feeding tube every 4 (four) hours as needed for severe pain (cancer related pain).   Water For Irrigation, Sterile (FREE WATER) SOLN Place 100 mLs into feeding tube every 8 (eight) hours. 50 ml free water flush before and after each TF bolus given   [DISCONTINUED] Nutritional Supplements (FEEDING SUPPLEMENT, OSMOLITE 1.5 CAL,) LIQD Place 474 mLs into feeding tube 3 (three) times daily after meals. (Patient taking differently: 1 CARTON 5 TIMES PER DAY WITH 50ML BEFORE AND AFTER EACH BOLUS.)   No facility-administered encounter medications on file as of 11/25/2020.    Review of Systems  GENERAL: No fever or chills  MOUTH and THROAT: Denies oral discomfort, gingival pain or bleeding RESPIRATORY: no cough, SOB, DOE, wheezing, hemoptysis CARDIAC: No chest pain, edema or palpitations GI: No abdominal pain, diarrhea, constipation, heart burn, nausea or vomiting GU: Denies dysuria, frequency, hematuria, incontinence, or discharge NEUROLOGICAL: Denies dizziness, syncope, numbness, or headache PSYCHIATRIC: Denies feelings of depression or anxiety. No report of hallucinations, insomnia, paranoia, or agitation    Immunization History  Administered Date(s)  Administered   Pension scheme manager 35yrs & up 10/15/2020   Pneumococcal Polysaccharide-23 09/05/2013   Pertinent  Health Maintenance Due  Topic Date Due   COLONOSCOPY (Pts 45-46yrs Insurance coverage will need to be confirmed)  Never done   INFLUENZA VACCINE  Never done   Fall Risk 10/27/2020 10/28/2020 10/28/2020 11/30/2020 11/30/2020  Falls in the past year? - - - - -  Patient Fall Risk Level Moderate fall risk Moderate fall risk Moderate fall risk High fall risk High fall risk     Vitals:   11/25/20 1454  BP: 125/79  Pulse: 81  Resp: 18  Temp: (!) 97.5 F (36.4 C)  Weight: 131 lb (59.4 kg)  Height: 5\' 10"  (1.778 m)   Body mass index is 18.8 kg/m.  Physical Exam  GENERAL APPEARANCE:  In no acute distress.  SKIN:  Skin is warm and dry.  MOUTH and THROAT: Lips are without lesions. Oral mucosa is moist and without lesions.  RESPIRATORY: Breathing is even & unlabored, BS CTAB CARDIAC: RRR, no murmur,no extra heart sounds, no edema GI:  normal BS, +peg tube and colostomy EXTREMITIES:  Able to move X 4 extremities NEUROLOGICAL: There is no tremor. Speech is clear. Alert and oriented X 3. PSYCHIATRIC:  Affect and behavior are appropriate  Labs reviewed: Recent Labs    10/18/20 0503 10/18/20 1551 10/19/20 0507 10/19/20 1459 10/19/20  1524 10/21/20 1536 10/23/20 1829 10/25/20 0440 11/08/20 1018 11/22/20 1040  NA 137  --   --    < >  --    < > 132*  --  135 135  K 4.4  --   --    < >  --    < > 4.1  --  3.8 4.0  CL 107  --   --    < >  --    < > 99  --  99 99  CO2 23  --   --    < >  --    < > 27  --  25 25  GLUCOSE 111*  --   --    < >  --    < > 145*  --  209* 131*  BUN 9  --   --    < >  --    < > 18  --  15 12  CREATININE 0.63  --   --    < >  --    < > 0.71 0.60* 0.94 0.90  CALCIUM 8.9  --   --    < >  --    < > 9.0  --  9.7 9.9  MG 1.8 2.2 2.2  --  1.9  --   --   --   --   --   PHOS 3.1 3.2 4.3  --   --   --   --   --   --   --    < > =  values in this interval not displayed.   Recent Labs    10/16/20 0527 11/08/20 1018 11/22/20 1040  AST 13* 51* 33  ALT 10 74* 39  ALKPHOS 114 148* 131*  BILITOT 0.4 0.6 0.3  PROT 6.2* 8.0 8.5*  ALBUMIN 2.7* 3.3* 3.6   Recent Labs    10/10/20 0436 10/11/20 1641 10/24/20 1605 11/08/20 1018 11/22/20 1040  WBC 3.8*   < > 4.4 5.6 4.0  NEUTROABS 2.2  --   --  3.1 2.6  HGB 8.2*   < > 9.5* 11.3* 11.7*  HCT 25.8*   < > 29.5* 35.8* 36.5*  MCV 77.2*   < > 80.2 79.0* 77.0*  PLT 371   < > 273 217 248   < > = values in this interval not displayed.   No results found for: TSH Lab Results  Component Value Date   HGBA1C 6.5 (H) 10/02/2020   Lab Results  Component Value Date   CHOL 197 07/14/2009   HDL 55 07/14/2009   LDLCALC 130 (H) 07/14/2009   TRIG 62 07/14/2009   CHOLHDL 3.6 Ratio 07/14/2009    Significant Diagnostic Results in last 30 days:  No results found.  Assessment/Plan  1. Esophageal dysphagia -  now on full liquid diet  -  aspiration precautions  2. Protein-calorie malnutrition, severe Wt Readings from Last 3 Encounters:  11/30/20 132 lb 12.8 oz (60.2 kg)  11/25/20 131 lb (59.4 kg)  11/22/20 134 lb 4.8 oz (60.9 kg)    -   albumin 3.6, Body mass index is 18.8 kg/m.  -    Magic cup BID, Prostat 45 ml BIDand Osmolite       1.5 cal 1 carton 5X/day via peg tube  3. Malignant neoplasm of middle third of esophagus [facility])      Prostate cancer ([place])      Cancer of sigmoid colon ([place]) pending  confirmatory biopsy      Bone metastases ([place])  -    followed by cancer center and now referred to hospice. -     continue Oxycodone 5 mg/5 ml every 4 hours PRN   Family/ staff Communication:  Discussed plan of care with resident and charge nurse.  Labs/tests ordered:  None  Goals of care:   Long-term care/hospice care   Durenda Age, DNP, MSN, FNP-BC [facility] [phone] (Monday-Friday 8:00 a.m. - 5:00 p.m.) [phone]  (after hours)

## 2020-11-29 ENCOUNTER — Non-Acute Institutional Stay: Payer: Medicare Other | Admitting: Hospice

## 2020-11-29 ENCOUNTER — Other Ambulatory Visit: Payer: Self-pay

## 2020-11-29 DIAGNOSIS — R531 Weakness: Secondary | ICD-10-CM

## 2020-11-29 DIAGNOSIS — Z515 Encounter for palliative care: Secondary | ICD-10-CM

## 2020-11-29 DIAGNOSIS — R1310 Dysphagia, unspecified: Secondary | ICD-10-CM

## 2020-11-29 DIAGNOSIS — C154 Malignant neoplasm of middle third of esophagus: Secondary | ICD-10-CM

## 2020-11-29 NOTE — Progress Notes (Signed)
[facility] Consult Note Telephone: [phone]  Fax: [phone]  PATIENT NAME: Marcus [name] [address] [place] [address] [phone] (home) [phone] (work) DOB: 08-08-1945 MRN: [id number]  PRIMARY CARE PROVIDER:    [name], MD,  [place] [address] [phone]  REFERRING PROVIDER:   Dr Marcus [name]  RESPONSIBLE PARTY:   self Marcus [name] (Brother) Preferred Number: [phone] Contact Information     Name Relation Home Work Mobile   perry,albert (*Brother in Sports coach) Relative   [phone]        I met face to face with patient at facility. Palliative Care was asked to follow this patient by consultation request of Dr Gean Birchwood to address advance care planning, complex medical decision making and goals of care clarification. Patient endorsed palliative service. This is the initial visit.    ASSESSMENT AND / RECOMMENDATIONS:   Advance Care Planning: Our advance care planning conversation included a discussion about:    The value and importance of advance care planning  Difference between Hospice and Palliative care Exploration of goals of care in the event of a sudden injury or illness  Identification and preparation of a healthcare agent  Review and updating or creation of an  advance directive document .  CODE STATUS: Patient is a Full code.   Goals of Care: Goals include to maximize quality of life and symptom management  I spent  16 minutes providing this initial consultation. More than 50% of the time in this consultation was spent on counseling patient and coordinating communication. --------------------------------------------------------------------------------------------------------------------------------------  Symptom Management/Plan: Malignant neoplasm of middle third of esophagus: Followed by Oncology.   Port a cath placed 10/11/20 for chemotherapy outpatient.   Dysphagia: likely related to CA of esophagus. Continue nutritional augmentation via G tube.  Routine CBC CMP Weakness: PT/OT is ongoing.  Follow up: Palliative care will continue to follow for complex medical decision making, advance care planning, and clarification of goals. Return 6 weeks or prn.Encouraged to call provider sooner with any concerns.   Family /Caregiver/Community Supports: Patient in SNF for ongoing care.  HOSPICE ELIGIBILITY/DIAGNOSIS: TBD  Chief Complaint: Initial Palliative care visit  HISTORY OF PRESENT ILLNESS:  Marcus [name] is a 75 y.o. year old male  with multiple medical conditions including Malignant neoplasm of middle third of esophagus, s/p 14 days of palliative radiation to the esophagus, with associated dysphagia and malnutrition; depends on nutritional augmentation via G tube. 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 He is followed by Oncology. He endorses weakness, denies pain during visit  but endorses occasional pain in his back.  History obtained from review of EMR, discussion with primary team, caregiver, family and/or Marcus [name].  Review and summarization of Epic records shows history from other than patient. Rest of 10 point ROS asked and negative.  I reviewed, as needed, available labs, patient records, imaging, studies and related documents from the EMR.  Recent Labs  Lab 11/22/20 1040  WBC 4.0  HGB 11.7*  HCT 36.5*  PLT 248  MCV 77.0*   Recent Labs  Lab 11/22/20 1040  NA 135  K 4.0  CL 99  CO2 25  BUN 12  CREATININE 0.90  GLUCOSE 131*    Recent Labs  Lab 11/22/20 1040  AST 33  ALT 39  ALKPHOS 131*     ROS General: NAD EYES: denies vision changes ENMT: denies dysphagia Cardiovascular: denies  chest pain/discomfort Pulmonary: denies cough, denies SOB Abdomen: endorses good appetite, denies constipation/diarrhea GU: denies dysuria, urinary frequency MSK:  endorses weakness,  no falls  reported Skin: denies rashes or wounds Neurological: endorse occasional pain, denies insomnia Psych: Endorses positive mood Heme/lymph/immuno: denies bruises, abnormal bleeding  Physical Exam: Constitutional: NAD General: Well groomed, cooperative EYES: anicteric sclera, lids intact, no discharge  ENMT: Moist mucous membrane CV: S1 S2, RRR, no LE edema Pulmonary: LCTA, no increased work of breathing, no cough, Abdomen: active BS + 4 quadrants, soft and non tender, colostomy present; G tube present GU: no suprapubic tenderness MSK: weakness, sarcopenia, limited ROM, ambulatory with rolling walker Skin: warm and dry, no rashes or wounds on visible skin Neuro:  weakness, otherwise non focal Psych: non-anxious affect Hem/lymph/immuno: no widespread bruising   PAST MEDICAL HISTORY:  Active Ambulatory Problems    Diagnosis Date Noted   Prostate cancer ([facility]) 09/04/2013   S/P right THA, 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   Hyponatremia 09/14/2018   Hypokalemia 09/14/2018   ARF (acute renal failure) ([facility]) 09/14/2018   Acute lower UTI 09/14/2018   Thoracic spine tumor 09/17/2020   Bone metastases ([facility])    Cord compression ([facility])    GI bleed 09/25/2020   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[facility]) 09/27/2020   Esophageal mass    Cancer of sigmoid colon ([facility]) pending confirmatory biopsy    Goals of care, counseling/discussion    Protein-calorie malnutrition, severe 10/17/2020   Resolved Ambulatory Problems    Diagnosis Date Noted   No Resolved Ambulatory Problems   Past Medical History:  Diagnosis Date   Arthritis    Depression    History of transfusion    Hypertension    Numbness and tingling of right arm    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     SOCIAL HX:  Social History   Tobacco Use   Smoking status: Every Day    Packs/day: 0.25    Types: Cigarettes   Smokeless tobacco: Never  Substance Use Topics   Alcohol use: Not Currently    Comment: 2- 40 ounce beers  per week     FAMILY HX:  Family History  Problem Relation Age of Onset   Seizures Mother    Cancer Father        throat cancer      ALLERGIES: No Known Allergies    PERTINENT MEDICATIONS:  Outpatient Encounter Medications as of 11/29/2020  Medication Sig   acetaminophen (TYLENOL) 160 MG/5ML solution Place 20.3 mLs (650 mg total) into feeding tube every 6 (six) hours as needed for mild pain.   Amino Acids-Protein Hydrolys (PRO-STAT MAX) LIQD Take by mouth. Give 45 ml per g/t twice a day for supplement   ferrous sulfate 300 (60 Fe) MG/5ML syrup Place 5 mLs (300 mg total) into feeding tube daily.   Multiple Vitamin (MULTIVITAMIN) LIQD Place 10 mLs into feeding tube daily.   Nutritional Supplements (FEEDING SUPPLEMENT, OSMOLITE 1.5 CAL,) LIQD 1 CARTON 5 TIMES PER DAY WITH 50ML BEFORE AND AFTER EACH BOLUS.   Nutritional Supplements (FEEDING SUPPLEMENT, PROSOURCE TF,) liquid Place 45 mLs into feeding tube 2 (two) times daily.   ondansetron (ZOFRAN) 4 MG tablet Give 1 tab per peg tube every 8 hours for nausea/vomiting   oxyCODONE (ROXICODONE) 5 MG/5ML solution Place 5 mLs (5 mg total) into feeding tube every 4 (four) hours as needed for severe pain (cancer related pain).   Water For Irrigation, Sterile (FREE WATER) SOLN Place  100 mLs into feeding tube every 8 (eight) hours. 50 ml free water flush before and after each TF bolus given   No facility-administered encounter medications on file as of 11/29/2020.     Thank you for the opportunity to participate in the care of Mr. Glennon.  The palliative care team will continue to follow. Please call our office at [phone] if we can be of additional assistance.   Note: Portions of this note were generated with Lobbyist. Dictation errors may occur despite best attempts at proofreading.  Teodoro Spray, NP

## 2020-11-30 ENCOUNTER — Encounter: Payer: Self-pay | Admitting: Urology

## 2020-11-30 ENCOUNTER — Ambulatory Visit
Admission: RE | Admit: 2020-11-30 | Discharge: 2020-11-30 | Disposition: A | Discharge status: Home / Self Care | Payer: Medicare Other | Source: Ambulatory Visit | Attending: Urology | Admitting: Urology

## 2020-11-30 ENCOUNTER — Telehealth: Payer: Self-pay

## 2020-11-30 VITALS — BP 116/79 | HR 88 | Temp 96.5°F | Resp 18 | Ht 67.0 in | Wt 132.8 lb

## 2020-11-30 DIAGNOSIS — C7951 Secondary malignant neoplasm of bone: Secondary | ICD-10-CM

## 2020-11-30 DIAGNOSIS — C61 Malignant neoplasm of prostate: Secondary | ICD-10-CM

## 2020-11-30 DIAGNOSIS — C154 Malignant neoplasm of middle third of esophagus: Secondary | ICD-10-CM

## 2020-11-30 NOTE — Telephone Encounter (Signed)
I spoke w/ patient's brother Marcus [name] who states Mr. Marcus [name] has moved to a hospice facility very close to [facility]. Mr. Marcus [name] did not have the contact info/address for the facility. I asked him to please have [name] call us if he has any questions.

## 2020-11-30 NOTE — Progress Notes (Signed)
  Radiation Oncology         [phone] ________________________________  Name: Marcus [name] MRN: [id number]  Date: 10/28/2020  DOB: [birth year]  End of Treatment Note  Diagnosis:   75 yo man s/p laminectomy and spinal cord decompression at T6 for metastatic prostate cancer with new diagnosis of obstructing carcinoma of the middle third of the esophagus and likely sigmoid colon cancer with inconclusive biopsy.     Indication for treatment:  Palliation       Radiation treatment dates:   10/17/20 - 10/28/20  Site/dose:   The targets at T6 and the esophagus were treated to 30 Gy in 10 fxs of 3 Gy each.  Beams/energy:   A 3D field set-up was employed with 6 MV X-rays  Narrative: The patient tolerated radiation treatment relatively well.   He did not appreciate much improvement in the back pain and did have some mild esophagitis towards the end of treatment.  Plan: The patient has completed radiation treatment. The patient will return to radiation oncology clinic for routine followup in one month. I advised him to call or return sooner if he has any questions or concerns related to his recovery or treatment. ________________________________  Sheral Apley. Tammi Klippel, M.D.

## 2020-11-30 NOTE — Progress Notes (Signed)
Radiation Oncology         [phone] ________________________________  Name: Marcus [name] MRN: [id number]  Date: 11/30/2020  DOB: 1945-03-26  Post Treatment Note  CC: Marcus Nova, MD  Marcus Nova, MD  Diagnosis:   75 yo man s/p laminectomy and spinal cord decompression at T6 for metastatic prostate cancer with new diagnosis of obstructing carcinoma of the middle third of the esophagus and likely sigmoid colon cancer with inconclusive biopsy.    Interval Since Last Radiation:  4.5 weeks  10/17/20 - 10/28/20:  The targets at T6 and the esophagus were treated to 30 Gy in 10 fxs of 3 Gy each.  Narrative:  The patient returns today with his brother, sister and brother-in-law, for routine follow-up.  He tolerated radiation treatment relatively well.   He did not appreciate much improvement in the back pain and did have some mild esophagitis towards the end of treatment.                              On review of systems, the patient states that he has continued with significant pain in his upper back and did not appreciate much if any improvement with the radiation. He has had significant improvement in his swallowing which he is quite pleased with. He remains quite weak and debilitated but is working with PT at [facility] facility currently. He reports a good appetite and is maintaining his weight, trying to gain a few pounds back. He denies any issues with his bowels or bladder, and has not had paraesthesias in the upper or lower extremities.  ALLERGIES:  has No Known Allergies.  Meds: Current Outpatient Medications  Medication Sig Dispense Refill   acetaminophen (TYLENOL) 160 MG/5ML solution Place 20.3 mLs (650 mg total) into feeding tube every 6 (six) hours as needed for mild pain.     Amino Acids-Protein Hydrolys (PRO-STAT MAX) LIQD Take by mouth. Give 45 ml per g/t twice a day for supplement     ferrous sulfate 300 (60 Fe) MG/5ML syrup Place 5 mLs (300 mg total) into  feeding tube daily.     Multiple Vitamin (MULTIVITAMIN) LIQD Place 10 mLs into feeding tube daily.     Nutritional Supplements (FEEDING SUPPLEMENT, OSMOLITE 1.5 CAL,) LIQD 1 CARTON 5 TIMES PER DAY WITH 50ML BEFORE AND AFTER EACH BOLUS.     Nutritional Supplements (FEEDING SUPPLEMENT, PROSOURCE TF,) liquid Place 45 mLs into feeding tube 2 (two) times daily.     ondansetron (ZOFRAN) 4 MG tablet Give 1 tab per peg tube every 8 hours for nausea/vomiting     oxyCODONE (ROXICODONE) 5 MG/5ML solution Place 5 mLs (5 mg total) into feeding tube every 4 (four) hours as needed for severe pain (cancer related pain). 60 mL 0   Water For Irrigation, Sterile (FREE WATER) SOLN Place 100 mLs into feeding tube every 8 (eight) hours. 50 ml free water flush before and after each TF bolus given     No current facility-administered medications for this encounter.    Physical Findings:  height is 5\' 7"  (1.702 m) and weight is 132 lb 12.8 oz (60.2 kg). His oral temperature is 96.5 F (35.8 C) (abnormal). His blood pressure is 116/79 and his pulse is 88. His respiration is 18 and oxygen saturation is 100%.  Pain Assessment Pain Score: 8  (bilateral shoulder blades)/10 In general this is a frail appearing African American male  in no acute distress. He's alert and oriented x4 and appropriate throughout the examination. Cardiopulmonary assessment is negative for acute distress and he exhibits normal effort.   Lab Findings: Lab Results  Component Value Date   WBC 4.0 11/22/2020   HGB 11.7 (L) 11/22/2020   HCT 36.5 (L) 11/22/2020   MCV 77.0 (L) 11/22/2020   PLT 248 11/22/2020     Radiographic Findings: No results found.  Impression/Plan: 57. 75 yo man s/p laminectomy and spinal cord decompression at T6 for metastatic prostate cancer with new diagnosis of obstructing carcinoma of the middle third of the esophagus and likely sigmoid colon cancer with inconclusive biopsy.   He appears to have recovered well from the  effects of his recent palliative radiotherapy.  He has continued with significant pain in the upper back and did not appreciate much, if any, improvement in his pain with the radiation treatments.  Fortunately, he did appreciate improvement in his swallowing which he is quite pleased with.  He remains quite weak but is working with physical therapy at [facility] rehab facility to try and regain some strength and improve his performance status, in hopes that he will be eligible for some palliative systemic therapy to help further delay progression of his disease.  He and his family are quite eager to meet with Dr. Irene Limbo to further discuss systemic treatment options.  We discussed that while we are happy to continue to participate in his care if clinically indicated, at this point, we will plan to see him back on as-needed basis.  We will work to try and coordinate a follow-up visit with Dr. Irene Limbo in the near future and look forward to continuing to follow his progress.  He and his family are comfortable and in agreement with the stated plan.    Marcus Johns, PA-C

## 2020-11-30 NOTE — Progress Notes (Signed)
Patient reports bilateral shoulder blade pain 8/10, reduced appetite, and fatigue.  Feeding tube intact  Meaningful use complete.  I-PSS Score of 4 (mild).  No current urinary management medications. No urology follow-up scheduled at this time -per [facility].  Patient's 9:30am -11/30/20 telephone appointment showed up as in-person and is requesting to see Ashlyn Bruning PA-C.

## 2020-12-01 ENCOUNTER — Encounter: Payer: Self-pay | Admitting: General Practice

## 2020-12-01 NOTE — Progress Notes (Signed)
[facility] Psychosocial Distress Screening Clinical Social Work  Clinical Social Work was referred by distress screening protocol.  The patient scored a 10 on the Psychosocial Distress Thermometer which indicates severe distress. Clinical Social Worker contacted patient by phone to assess for distress and other psychosocial needs. Patient is at [facility]hab.  He wants to return to live w brother in [place] at discharge from SNF.  Family is trying to get this in order; however, oncologist has recommended against flying.  They are waiting on a call to set up an appointment w oncologist prior to family returning to [place] on Nov 19th (2 days from now).  CSW will relay this urgent concern to desk RN.    ONCBCN DISTRESS SCREENING 11/30/2020  Screening Type   Distress experienced in past week (1-10) 10  Practical problem type   Family Problem type   Emotional problem type Nervousness/Anxiety;Adjusting to illness;Isolation/feeling alone;Feeling hopeless  Spiritual/Religous concerns type   Physical Problem type   Physician notified of physical symptoms   Referral to clinical social work   Referral to dietition   Referral to financial advocate     Clinical Social Worker follow up needed: Yes.    If yes, follow up plan:  follow up as appropriate per response from medical oncologist.   Beverely Pace, [place], [place] Worker Phone:  [phone]

## 2020-12-02 ENCOUNTER — Other Ambulatory Visit: Payer: Self-pay | Admitting: Adult Health

## 2020-12-02 MED ORDER — OXYCODONE HCL 5 MG/5ML PO SOLN: 5.0000 mg | ORAL | 0 refills | Status: DC | PRN

## 2020-12-02 NOTE — Progress Notes (Signed)
HEMATOLOGY-ONCOLOGY PROGRESS NOTE  DOS .11/22/2020  SUBJECTIVE:   Marcus [name] is here for interval follow-up for his metastatic prostate cancer, sigmoid colon cancer and esophageal cancer and to again define goals of care.  He is here with his brother-in-law in person and with his other family members from [place] on the phone during the discussion today. Patient notes he wants to consider moving to [place] to be with his brother and brother's family but does not feel strong enough to travel.  He also does not have much of a sense of how he will be cared for once he gets to [place].  We again discussed his concerning situations with poor functional status and multiple noncurable cancers and poor candidacy for systemic palliative therapies.  He is not keen to consider palliative chemotherapy at this time nor would he be a good candidate for this. We discussed that if he did not get much stronger with nutrition support that this question could be revisited.  We would have to likely get repeat scans and a biopsy of his liver metastases to determine the etiology of his liver metastases and abdominal metastases to try to prioritize which cancer to treat. Is colostomy is working well and he continues to get tube feeding at his current facility. At this time after extensive discussion with his brother-in-law at bedside and other family members on the phone and with the patient he prefers to be referred to hospice for best supportive cares and does not feel like he can travel to [place].  We discussed that if he does decide to travel [place] with his family he will need to have a doctor set up there and possible hospice services and if there is consideration for palliative treatments and he changes mind he will need an oncologist as well. We discussed that setting of these care services will likely take some time and if he does decide to do so we shall be happy to refer him to an oncology practice in [place]  close to his residence.  At this point the patient notes he is too weak to think about traveling and would like to focus on being kept comfortable. He is family member from [place] notes that have plans to visit him soon and plan to spend time with him here in [place] as opposed to moving him to [place].  REVIEW OF SYSTEMS:   .10 Point review of Systems was done is negative except as noted above.   I have reviewed the past medical history, past surgical history, social history and family history with the patient and they are unchanged from previous note.   PHYSICAL EXAMINATION: ECOG PERFORMANCE STATUS: 1 - Symptomatic but completely ambulatory  Vitals:   11/22/20 1150  BP: (!) 132/94  Pulse: 95  Resp: 18  Temp: (!) 97 F (36.1 C)  SpO2: 100%   Filed Weights   11/22/20 1150  Weight: 134 lb 4.8 oz (60.9 kg)    Intake/Output from previous day: No intake/output data recorded. Very frail and tired appearing elderly gentleman in no acute distress GENERAL:alert, in no acute distress and comfortable SKIN: no acute rashes, no significant lesions EYES: conjunctiva are pink and non-injected, sclera anicteric OROPHARYNX: MMM, no exudates, no oropharyngeal erythema or ulceration NECK: supple, no JVD LYMPH:  no palpable lymphadenopathy in the cervical, axillary or inguinal regions LUNGS: clear to auscultation b/l with normal respiratory effort HEART: regular rate & rhythm ABDOMEN:  normoactive bowel sounds , non tender, not distended. ostomy and G-tube  in situ Extremity: no pedal edema PSYCH: alert & oriented x 3 with fluent speech NEURO: no focal motor/sensory deficits     LABORATORY DATA:  I have reviewed the data as listed  . CBC Latest Ref Rng & Units 11/22/2020 11/08/2020 10/24/2020  WBC 4.0 - 10.5 K/uL 4.0 5.6 4.4  Hemoglobin 13.0 - 17.0 g/dL 11.7(L) 11.3(L) 9.5(L)  Hematocrit 39.0 - 52.0 % 36.5(L) 35.8(L) 29.5(L)  Platelets 150 - 400 K/uL 248 217 273    CMP  Latest Ref Rng & Units 11/22/2020 11/08/2020 10/25/2020  Glucose 70 - 99 mg/dL 131(H) 209(H) -  BUN 8 - 23 mg/dL 12 15 -  Creatinine 0.61 - 1.24 mg/dL 0.90 0.94 0.60(L)  Sodium 135 - 145 mmol/L 135 135 -  Potassium 3.5 - 5.1 mmol/L 4.0 3.8 -  Chloride 98 - 111 mmol/L 99 99 -  CO2 22 - 32 mmol/L 25 25 -  Calcium 8.9 - 10.3 mg/dL 9.9 9.7 -  Total Protein 6.5 - 8.1 g/dL 8.5(H) 8.0 -  Total Bilirubin 0.3 - 1.2 mg/dL 0.3 0.6 -  Alkaline Phos 38 - 126 U/L 131(H) 148(H) -  AST 15 - 41 U/L 33 51(H) -  ALT 0 - 44 U/L 39 74(H) -      No results found.  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 Tresa [name].  Had extracapsular extension and positive resection margins. -Status post postoperative radiation therapy by Dr. Tammi [name]. -Lost to follow-up after treatment.   2) T5-T7 sclerotic bone lesion involving posterior elements with right-sided epidural tumor and T5-6 T6-7 foraminal infiltration. 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  The tumor is negative with CDX2, cytokeratin 7, cytokeratin 20, MOC31, prostein and prostate-specific antigen.  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 -Biopsy from 09/28/2020 showed a high-grade dysplasia arising in a tubular adenoma-biopsy may not be representative of the mass seen endoscopically -Status post laparoscopic-assisted Hartman's with G-tube placement on 09/30/2020  5) Extensive lymphadenopathy in the abdomen and hypodensities in the liver concerning for metastatic disease likely from metastatic esophageal cancer  6) microcytic anemia secondary to GI bleed, B12 deficiency -Labs from 09/25/2020-ferritin 374, iron 14, TIBC 235, percent saturation 6%, folate 7.5, vitamin B12 140 -Iron sucrose 200 mg IV x3 doses started on 09/29/2020   7) active smoker half a pack per day 8) poor medical follow-up 9) family history of GI malignancy -Reported sister with colon malignancy 10) poor functional status ECOG PS 3-4 PLAN: -Patient is here for a second extensive discussion of goals of care - We again discussed his concerning situations with poor functional status and multiple noncurable cancers and poor candidacy for systemic palliative therapies.  He is not keen to consider palliative  chemotherapy at this time nor would he be a good candidate for this. We discussed that if he did not get much stronger with nutrition support that this question could be revisited.  We would have to likely get repeat scans and a biopsy of his liver metastases to determine the etiology of his liver metastases and abdominal metastases to try to prioritize which cancer to treat. Is colostomy is working well and he continues to get tube feeding at his current facility. At this time after extensive discussion with his brother-in-law at bedside and other family members on the phone and with the patient he prefers to be referred to hospice for best supportive cares and does not feel like he can travel to [place].  We discussed that if he does decide to travel [place] with his family he  will need to have a doctor set up there and possible hospice services and if there is consideration for palliative treatments and he changes mind he will need an oncologist as well. We discussed that setting of these care services will likely take some time and if he does decide to do so we shall be happy to refer him to an oncology practice in [place] close to his residence.  At this point the patient notes he is too weak to think about traveling and would like to focus on being kept comfortable. He is family member from [place] notes that have plans to visit him soon and plan to spend time with him here in [place] as opposed to moving him to [place].  -With patient's consent and in keeping with the shared decision making by patient, his brother-in-law and his family on the phone from [place] we have proceeded to refer him to hospice for best supportive cares.  -We shall see him back as needed.  . The total time spent in the appointment was 35 minutes and more than 50% was on counseling and direct patient cares.   Marcus Lone MD MS Hematology/Oncology Physician [facility]

## 2020-12-15 ENCOUNTER — Non-Acute Institutional Stay (SKILLED_NURSING_FACILITY): Payer: Medicare Other | Admitting: Adult Health

## 2020-12-15 DIAGNOSIS — K5901 Slow transit constipation: Secondary | ICD-10-CM | POA: Diagnosis not present

## 2020-12-15 DIAGNOSIS — C187 Malignant neoplasm of sigmoid colon: Secondary | ICD-10-CM | POA: Diagnosis not present

## 2020-12-15 DIAGNOSIS — G893 Neoplasm related pain (acute) (chronic): Secondary | ICD-10-CM

## 2020-12-15 DIAGNOSIS — C154 Malignant neoplasm of middle third of esophagus: Secondary | ICD-10-CM

## 2020-12-15 DIAGNOSIS — R630 Anorexia: Secondary | ICD-10-CM

## 2020-12-15 DIAGNOSIS — D500 Iron deficiency anemia secondary to blood loss (chronic): Secondary | ICD-10-CM

## 2020-12-15 DIAGNOSIS — C7951 Secondary malignant neoplasm of bone: Secondary | ICD-10-CM | POA: Diagnosis not present

## 2020-12-16 ENCOUNTER — Encounter: Payer: Self-pay | Admitting: Adult Health

## 2020-12-16 NOTE — Progress Notes (Addendum)
Location:  [facility] Room Number: 108-A Place of Service:  SNF (31) Provider:  Durenda Age, DNP, FNP-BC  Patient Care Team: Hendricks Limes, MD as PCP - General (Internal Medicine)  Extended Emergency Contact Information Primary Emergency Contact: perry,albert (*Brother in law) Mobile Phone: [phone] Relation: Relative  Code Status:  FULL CODE  Goals of care: Advanced Directive information Advanced Directives 12/16/2020  Does Patient Have a Medical Advance Directive? Yes  Type of Paramedic of [place];Living will  Does patient want to make changes to medical advance directive? No - Patient declined  Copy of [place] in Chart? Yes - validated most recent copy scanned in chart (See row information)  Would patient like information on creating a medical advance directive? -     Chief Complaint  Patient presents with   Medical Management of Chronic Issues    Routine Visit.     HPI:  Pt is a 75 y.o. male seen today for medical management of chronic diseases. He is a long-term care resident of [facility]. He has a PMH of prostate cancer with bone mets status post radical prostatectomy and radiation in 2015, thoracic spine tumor with cord compression S/P T5-6 laminectomy with posterior lateral arthrodesis from T4-T8 and anemia.  He was recently diagnosed with poorly differentiated squamous cell esophageal cancer in addition to sigmoid colon adenocarcinoma.  During his recent admission to the hospital, he had Hartmann's procedure with end colostomy. He is now followed by hospice. He complained of having constipation, having hard stools on colostomy. Staff reported that he has poor appetite, as well.  He takes ferrous sulfate 300 mg / 5 mL daily for anemia.  Latest hemoglobin 11.7, 11/22/2020.  He is currently on regular diet with supplementations of Osmolite 1.5 Cal liquid 1 carton 5  times/day, pro-stat 45 mL twice a day and Magic cup twice a day for supplementation.   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    Prostate cancer ([place]) 09/04/13   Adenocarcinoma   Past Surgical History:  Procedure Laterality Date   BIOPSY  09/26/2020   Procedure: BIOPSY;  Surgeon: Ronnette Juniper, MD;  Location: [facility];  Service: Gastroenterology;;   BIOPSY  09/28/2020   Procedure: BIOPSY;  Surgeon: Ronnette Juniper, MD;  Location: [facility];  Service: Gastroenterology;;   COLON RESECTION N/A 09/30/2020   Procedure: HAND ASSISTED LAPAROSCOPIC HARTMAN'S PROCEDURE WITH END COLOSTOMY; TAP BLOCK, PLACEMENT OF PEG TUBE;  Surgeon: Greer Pickerel, MD;  Location: [facility];  Service: General;  Laterality: N/A;   ESOPHAGOGASTRODUODENOSCOPY (EGD) WITH PROPOFOL N/A 09/26/2020   Procedure: ESOPHAGOGASTRODUODENOSCOPY (EGD) WITH PROPOFOL;  Surgeon: Ronnette Juniper, MD;  Location: [facility];  Service: Gastroenterology;  Laterality: N/A;   FLEXIBLE SIGMOIDOSCOPY N/A 09/28/2020   Procedure: FLEXIBLE SIGMOIDOSCOPY;  Surgeon: Ronnette Juniper, MD;  Location: [facility];  Service: Gastroenterology;  Laterality: N/A;   LAMINECTOMY N/A 09/18/2020   Procedure: THORACIC LAMINECTOMY FOR TUMOR;  Surgeon: Newman Pies, MD;  Location: [facility];  Service: Neurosurgery;  Laterality: N/A;   LYMPHADENECTOMY Bilateral 09/04/2013   Procedure: PELVIC LYMPH NODE DISSECTION;  Surgeon: Alexis Frock, MD;  Location: [facility];  Service: Urology;  Laterality: Bilateral;   PORTACATH PLACEMENT N/A 10/11/2020  Procedure: INSERTION PORT-A-CATH;  Surgeon: Armandina Gemma, MD;  Location: [facility];  Service: General;  Laterality: N/A;  60 MINUTES ROOM 4   ROBOT ASSISTED LAPAROSCOPIC RADICAL PROSTATECTOMY N/A 09/04/2013   Procedure: ROBOTIC ASSISTED LAPAROSCOPIC RADICAL PROSTATECTOMY,  INDOCYANINE GREEN DYE;  Surgeon: Alexis Frock, MD;  Location: [facility];  Service: Urology;  Laterality: N/A;   SUBMUCOSAL TATTOO INJECTION  09/28/2020   Procedure: SUBMUCOSAL TATTOO INJECTION;  Surgeon: Ronnette Juniper, MD;  Location: [facility];  Service: Gastroenterology;;   TOTAL HIP ARTHROPLASTY Right 05/15/2016   Procedure: RIGHT TOTAL HIP ARTHROPLASTY ANTERIOR APPROACH;  Surgeon: Paralee Cancel, MD;  Location: [facility];  Service: Orthopedics;  Laterality: Right;   WRIST GANGLION EXCISION      No Known Allergies  Outpatient Encounter Medications as of 12/15/2020  Medication Sig   acetaminophen (TYLENOL) 160 MG/5ML solution Place 20.3 mLs (650 mg total) into feeding tube every 6 (six) hours as needed for mild pain.   Amino Acids-Protein Hydrolys (PRO-STAT MAX) LIQD Take by mouth. Give 45 ml per g/t twice a day for supplement   ferrous sulfate 300 (60 Fe) MG/5ML syrup Place 5 mLs (300 mg total) into feeding tube daily.   mirtazapine (REMERON) 15 MG tablet Take 15 mg by mouth at bedtime.   Multiple Vitamin (MULTIVITAMIN) LIQD Place 10 mLs into feeding tube daily.   Nutritional Supplements (FEEDING SUPPLEMENT, OSMOLITE 1.5 CAL,) LIQD 1 CARTON 5 TIMES PER DAY WITH 50ML BEFORE AND AFTER EACH BOLUS.   Nutritional Supplements (NUTRITIONAL SUPPLEMENT PO) Take by mouth in the morning and at bedtime. Magic Cup.   ondansetron (ZOFRAN) 4 MG tablet Give 1 tab per peg tube every 8 hours for nausea/vomiting   oxyCODONE (ROXICODONE) 5 MG/5ML solution Place 5 mLs (5 mg total) into feeding tube every 4 (four) hours as needed for severe pain (cancer related pain).   polyethylene glycol (MIRALAX / GLYCOLAX) 17 g packet Take 17 g by mouth daily.   Water For Irrigation, Sterile (FREE WATER) SOLN Place 100 mLs into feeding tube every 8 (eight) hours. 50 ml free water flush before and after each TF bolus given   [DISCONTINUED] Nutritional Supplements (FEEDING SUPPLEMENT, PROSOURCE TF,) liquid Place 45 mLs into feeding tube  2 (two) times daily.   No facility-administered encounter medications on file as of 12/15/2020.    Review of Systems  GENERAL: No change in appetite, no fatigue, no weight changes, no fever or chills  MOUTH and THROAT: Denies oral discomfort, gingival pain or bleeding RESPIRATORY: no cough, SOB, DOE, wheezing, hemoptysis CARDIAC: No chest pain, edema or palpitations GI: +constipation GU: Denies dysuria, frequency, hematuria or discharge NEUROLOGICAL: Denies dizziness, syncope, numbness, or headache PSYCHIATRIC: Denies feelings of depression or anxiety. No report of hallucinations, insomnia, paranoia, or agitation   Immunization History  Administered Date(s) Administered   DTaP 10/15/2017   Influenza-Unspecified 08/16/2019   PFIZER(Purple Top)SARS-COV-2 Vaccination 02/16/2019, 03/16/2019   Pfizer Covid-19 Vaccine Bivalent Booster 23yrs & up 10/15/2020   Pneumococcal Polysaccharide-23 09/05/2013   Pertinent  Health Maintenance Due  Topic Date Due   COLONOSCOPY (Pts 45-16yrs Insurance coverage will need to be confirmed)  Never done   INFLUENZA VACCINE  08/15/2020   Fall Risk 10/27/2020 10/28/2020 10/28/2020 11/30/2020 11/30/2020  Falls in the past year? - - - - -  Patient Fall Risk Level Moderate fall risk Moderate fall risk Moderate fall risk High fall risk High fall risk     Vitals:   12/16/20 1504  BP: 140/81  Pulse: 80  Resp: (!) 21  Temp: (!) 97.2 F (36.2 C)  Weight: 131 lb (59.4 kg)  Height: 5\' 7"  (1.702 m)   Body mass index is 20.52 kg/m.  Physical Exam  GENERAL APPEARANCE: Well nourished. In no acute distress. Normal body habitus SKIN:  Skin is warm and dry.  MOUTH and THROAT: Lips are without lesions. Oral mucosa is moist and without lesions.  RESPIRATORY: Breathing is even & unlabored, BS CTAB CARDIAC: RRR, no murmur,no extra heart sounds, no edema GI: +peg tube and colostomy EXTREMITIES:  Able to move X 4 extremities NEUROLOGICAL: There is no tremor.  Speech is clear. Alert and oriented X 3. PSYCHIATRIC:  Affect and behavior are appropriate  Labs reviewed: Recent Labs    10/18/20 0503 10/18/20 1551 10/19/20 0507 10/19/20 1459 10/19/20 1524 10/21/20 1536 10/23/20 1829 10/25/20 0440 11/08/20 1018 11/22/20 1040  NA 137  --   --    < >  --    < > 132*  --  135 135  K 4.4  --   --    < >  --    < > 4.1  --  3.8 4.0  CL 107  --   --    < >  --    < > 99  --  99 99  CO2 23  --   --    < >  --    < > 27  --  25 25  GLUCOSE 111*  --   --    < >  --    < > 145*  --  209* 131*  BUN 9  --   --    < >  --    < > 18  --  15 12  CREATININE 0.63  --   --    < >  --    < > 0.71 0.60* 0.94 0.90  CALCIUM 8.9  --   --    < >  --    < > 9.0  --  9.7 9.9  MG 1.8 2.2 2.2  --  1.9  --   --   --   --   --   PHOS 3.1 3.2 4.3  --   --   --   --   --   --   --    < > = values in this interval not displayed.   Recent Labs    10/16/20 0527 11/08/20 1018 11/22/20 1040  AST 13* 51* 33  ALT 10 74* 39  ALKPHOS 114 148* 131*  BILITOT 0.4 0.6 0.3  PROT 6.2* 8.0 8.5*  ALBUMIN 2.7* 3.3* 3.6   Recent Labs    10/10/20 0436 10/11/20 1641 10/24/20 1605 11/08/20 1018 11/22/20 1040  WBC 3.8*   < > 4.4 5.6 4.0  NEUTROABS 2.2  --   --  3.1 2.6  HGB 8.2*   < > 9.5* 11.3* 11.7*  HCT 25.8*   < > 29.5* 35.8* 36.5*  MCV 77.2*   < > 80.2 79.0* 77.0*  PLT 371   < > 273 217 248   < > = values in this interval not displayed.   No results found for: TSH Lab Results  Component Value Date   HGBA1C 6.5 (H) 10/02/2020   Lab Results  Component Value Date   CHOL 197 07/14/2009   HDL 55 07/14/2009   LDLCALC 130 (H) 07/14/2009   TRIG 62 07/14/2009   CHOLHDL 3.6 Ratio 07/14/2009  Significant Diagnostic Results in last 30 days:  No results found.  Assessment/Plan  1. 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HCC) Cancer of sigmoid colon [facility]) pending confirmatory biopsy Bone metastases ([place]) -  now on comfort care -   followed by  hospice  4. Slow transit constipation -  will start on MiraLAX 17 g daily  5. Poor appetite -   Continue diet as tolerated with supplementations -    will start on Remeron 15 mg daily  6. Anemia due to chronic blood loss Lab Results  Component Value Date   HGB 11.7 (L) 11/22/2020   -   Continue ferrous sulfate  7. Chronic pain due to neoplasm -   rated generalized pain as 6/10 -   Continue oxycodone 5 mg every 12 hours    Family/ staff Communication: Discussed plan of care with resident and charge nurse.  Labs/tests ordered:   None  Goals of care:    Long-term care/hospice care   Durenda Age, DNP, MSN, FNP-BC [facility] [phone] (Monday-Friday 8:00 a.m. - 5:00 p.m.) [phone] (after hours)

## 2021-01-10 ENCOUNTER — Encounter: Payer: Self-pay | Admitting: Adult Health

## 2021-01-10 ENCOUNTER — Non-Acute Institutional Stay (SKILLED_NURSING_FACILITY): Payer: Medicare Other | Admitting: Adult Health

## 2021-01-10 DIAGNOSIS — C7951 Secondary malignant neoplasm of bone: Secondary | ICD-10-CM

## 2021-01-10 DIAGNOSIS — D500 Iron deficiency anemia secondary to blood loss (chronic): Secondary | ICD-10-CM | POA: Diagnosis not present

## 2021-01-10 DIAGNOSIS — R6884 Jaw pain: Secondary | ICD-10-CM | POA: Diagnosis not present

## 2021-01-10 NOTE — Progress Notes (Signed)
Location:  [facility] Room Number: [place] of Service:  SNF (31) Provider:  Durenda Age, DNP, FNP-BC  Patient Care Team: Hendricks Limes, MD as PCP - General (Internal Medicine)  Extended Emergency Contact Information Primary Emergency Contact: perry,albert (*Brother in law) Mobile Phone: [phone] Relation: Relative  Code Status:  FULL CODE  Goals of care: Advanced Directive information Advanced Directives 01/10/2021  Does Patient Have a Medical Advance Directive? Yes  Type of Paramedic of [place];Living will  Does patient want to make changes to medical advance directive? No - Patient declined  Copy of [place] in Chart? Yes - validated most recent copy scanned in chart (See row information)  Would patient like information on creating a medical advance directive? -     Chief Complaint  Patient presents with   Acute Visit    Jaw and gum pain.    HPI:  Pt is a 75 y.o. male seen today for an acute visit. He is a long-term care resident of [facility]. He is being followed by hospice.  He has a PMH of prostate cancer with bone metastasis S/P radical prostatectomy and radiation in 2015, thoracic spine tumor with cord compression S/P T5-6 laminectomy with posterior lateral arthrodesis from T4-T8 and anemia. He is complaining of pain on his right jaw, 8/10 pain. He denies having pain on his gums.  He is currently taking oxycodone 5 mg/0.25 mL every 6 hours PRN and 5 mg / 0.25 mL every 8 hours routinely for pain. Latest hgb 10.3, 11/04/20. He takes ferrous sulfate 220 mg / 5 mL give 6.8 mL = 300 mg via G-tube daily for anemia.   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    Prostate cancer ([place]) 09/04/13    Adenocarcinoma   Past Surgical History:  Procedure Laterality Date   BIOPSY  09/26/2020   Procedure: BIOPSY;  Surgeon: Ronnette Juniper, MD;  Location: [facility];  Service: Gastroenterology;;   BIOPSY  09/28/2020   Procedure: BIOPSY;  Surgeon: Ronnette Juniper, MD;  Location: [facility];  Service: Gastroenterology;;   COLON RESECTION N/A 09/30/2020   Procedure: HAND ASSISTED LAPAROSCOPIC HARTMAN'S PROCEDURE WITH END COLOSTOMY; TAP BLOCK, PLACEMENT OF PEG TUBE;  Surgeon: Greer Pickerel, MD;  Location: [facility];  Service: General;  Laterality: N/A;   ESOPHAGOGASTRODUODENOSCOPY (EGD) WITH PROPOFOL N/A 09/26/2020   Procedure: ESOPHAGOGASTRODUODENOSCOPY (EGD) WITH PROPOFOL;  Surgeon: Ronnette Juniper, MD;  [facility];  Service: Gastroenterology;  Laterality: N/A;   FLEXIBLE SIGMOIDOSCOPY N/A 09/28/2020   Procedure: FLEXIBLE SIGMOIDOSCOPY;  Surgeon: Ronnette Juniper, MD;  [facility];  Service: Gastroenterology;  Laterality: N/A;   LAMINECTOMY N/A 09/18/2020   Procedure: THORACIC LAMINECTOMY FOR TUMOR;  Surgeon: Newman Pies, MD;  Location: [facility];  Service: Neurosurgery;  Laterality: N/A;   LYMPHADENECTOMY Bilateral 09/04/2013   Procedure: PELVIC LYMPH NODE DISSECTION;  Surgeon: Alexis Frock, MD;  Location: [facility];  Service: Urology;  Laterality: Bilateral;   PORTACATH PLACEMENT N/A 10/11/2020   Procedure: INSERTION PORT-A-CATH;  Surgeon: Armandina Gemma, MD;  Location: [facility];  Service: General;  Laterality: N/A;  60 MINUTES ROOM 4   ROBOT ASSISTED LAPAROSCOPIC RADICAL PROSTATECTOMY N/A 09/04/2013   Procedure: ROBOTIC ASSISTED LAPAROSCOPIC RADICAL  PROSTATECTOMY, INDOCYANINE GREEN DYE;  Surgeon: Alexis Frock, MD;  Location: [facility];  Service: Urology;  Laterality: N/A;   SUBMUCOSAL TATTOO INJECTION  09/28/2020   Procedure: SUBMUCOSAL TATTOO INJECTION;  Surgeon: Ronnette Juniper, MD;  Location[facility];  Service: Gastroenterology;;   TOTAL HIP ARTHROPLASTY Right 05/15/2016   Procedure: RIGHT TOTAL HIP  ARTHROPLASTY ANTERIOR APPROACH;  Surgeon: Paralee Cancel, MD;  Location: [facility];  Service: Orthopedics;  Laterality: Right;   WRIST GANGLION EXCISION      No Known Allergies  Outpatient Encounter Medications as of 01/10/2021  Medication Sig   acetaminophen (TYLENOL) 160 MG/5ML solution Place 20.3 mLs (650 mg total) into feeding tube every 6 (six) hours as needed for mild pain.   Amino Acids-Protein Hydrolys (PRO-STAT MAX) LIQD Take by mouth. Give 45 ml per g/t twice a day for supplement   ferrous sulfate 300 (60 Fe) MG/5ML syrup Place 5 mLs (300 mg total) into feeding tube daily.   mirtazapine (REMERON) 15 MG tablet Take 15 mg by mouth at bedtime.   Multiple Vitamin (MULTIVITAMIN) LIQD Place 10 mLs into feeding tube daily.   Nutritional Supplements (FEEDING SUPPLEMENT, OSMOLITE 1.5 CAL,) LIQD 1 CARTON 5 TIMES PER DAY WITH 50ML BEFORE AND AFTER EACH BOLUS.   Nutritional Supplements (NUTRITIONAL SUPPLEMENT PO) Take by mouth in the morning and at bedtime. Magic Cup.   ondansetron (ZOFRAN) 4 MG tablet Give 1 tab per peg tube every 8 hours for nausea/vomiting   oxyCODONE (ROXICODONE) 5 MG/5ML solution Place 5 mLs (5 mg total) into feeding tube every 4 (four) hours as needed for severe pain (cancer related pain).   oxyCODONE HCl 10 MG/0.5ML CONC Take 5 mg by mouth. Give 5MG =0.25 EVERY SIX HOURS PO/PEG SCHEDULED PAIN MANGEMENT.   polyethylene glycol (MIRALAX / GLYCOLAX) 17 g packet Take 17 g by mouth daily.   Water For Irrigation, Sterile (FREE WATER) SOLN Place 100 mLs into feeding tube every 8 (eight) hours. 50 ml free water flush before and after each TF bolus given   No facility-administered encounter medications on file as of 01/10/2021.    Review of Systems  GENERAL: No change in appetite, no fatigue, no weight changes, no fever or chills  MOUTH and THROAT: Has oral discomfort RESPIRATORY: no cough, SOB, DOE, wheezing, hemoptysis CARDIAC: No chest pain, edema or palpitations GI: No  abdominal pain, diarrhea, constipation, heart burn, nausea or vomiting GU: Denies dysuria, frequency, hematuria or discharge NEUROLOGICAL: Denies dizziness, syncope, numbness, or headache PSYCHIATRIC: Denies feelings of depression or anxiety. No report of hallucinations, insomnia, paranoia, or agitation  Immunization History  Administered Date(s) Administered   DTaP 10/15/2017   Influenza-Unspecified 08/16/2019   PFIZER(Purple Top)SARS-COV-2 Vaccination 02/16/2019, 03/16/2019   Pfizer Covid-19 Vaccine Bivalent Booster 46yrs & up 10/15/2020   Pneumococcal Polysaccharide-23 09/05/2013   Pertinent  Health Maintenance Due  Topic Date Due   COLONOSCOPY (Pts 45-32yrs Insurance coverage will need to be confirmed)  Never done   INFLUENZA VACCINE  08/15/2020   Fall Risk 10/27/2020 10/28/2020 10/28/2020 11/30/2020 11/30/2020  Falls in the past year? - - - - -  Patient Fall Risk Level Moderate fall risk Moderate fall risk Moderate fall risk High fall risk High fall risk     Vitals:   01/10/21 1551  BP: 123/79  Pulse: 81  Resp: 18  Temp: (!) 97.5 F (36.4 C)  Height: 5\' 7"  (1.702 m)   Body mass index is 20.52 kg/m.  Physical Exam  GENERAL APPEARANCE: Normal body habitus SKIN:  Skin is warm and dry.  MOUTH and THROAT: Lips are without lesions. Oral mucosa is moist and without lesions.  RESPIRATORY: Breathing is even & unlabored, BS CTAB CARDIAC: RRR, no murmur,no extra heart sounds, no edema GI: Has peg tube and colostomy intact NEUROLOGICAL: There is no tremor. Speech is clear. Alert and oriented X 3. PSYCHIATRIC:  Affect and behavior are appropriate  Labs reviewed: Recent Labs    10/18/20 0503 10/18/20 1551 10/19/20 0507 10/19/20 1459 10/19/20 1524 10/21/20 1536 10/23/20 1829 10/25/20 0440 11/08/20 1018 11/22/20 1040  NA 137  --   --    < >  --    < > 132*  --  135 135  K 4.4  --   --    < >  --    < > 4.1  --  3.8 4.0  CL 107  --   --    < >  --    < > 99  --  99  99  CO2 23  --   --    < >  --    < > 27  --  25 25  GLUCOSE 111*  --   --    < >  --    < > 145*  --  209* 131*  BUN 9  --   --    < >  --    < > 18  --  15 12  CREATININE 0.63  --   --    < >  --    < > 0.71 0.60* 0.94 0.90  CALCIUM 8.9  --   --    < >  --    < > 9.0  --  9.7 9.9  MG 1.8 2.2 2.2  --  1.9  --   --   --   --   --   PHOS 3.1 3.2 4.3  --   --   --   --   --   --   --    < > = values in this interval not displayed.   Recent Labs    10/16/20 0527 11/08/20 1018 11/22/20 1040  AST 13* 51* 33  ALT 10 74* 39  ALKPHOS 114 148* 131*  BILITOT 0.4 0.6 0.3  PROT 6.2* 8.0 8.5*  ALBUMIN 2.7* 3.3* 3.6   Recent Labs    10/10/20 0436 10/11/20 1641 10/24/20 1605 11/08/20 1018 11/22/20 1040  WBC 3.8*   < > 4.4 5.6 4.0  NEUTROABS 2.2  --   --  3.1 2.6  HGB 8.2*   < > 9.5* 11.3* 11.7*  HCT 25.8*   < > 29.5* 35.8* 36.5*  MCV 77.2*   < > 80.2 79.0* 77.0*  PLT 371   < > 273 217 248   < > = values in this interval not displayed.   No results found for: TSH Lab Results  Component Value Date   HGBA1C 6.5 (H) 10/02/2020   Lab Results  Component Value Date   CHOL 197 07/14/2009   HDL 55 07/14/2009   LDLCALC 130 (H) 07/14/2009   TRIG 62 07/14/2009   CHOLHDL 3.6 Ratio 07/14/2009    Significant Diagnostic Results in last 30 days:  No results found.  Assessment/Plan  1. Jaw pain -   will increase oxycodone 5 mg / 0.25 mL SL from every 8 hours to every 6 hours and continue oxycodone 5 mg / 0.25 mm SL every 6 hours as  needed  2. Anemia due to chronic blood loss -  Hgb 10.3, stable -   Continue ferrous sulfate 300 mg via PEG tube daily  3. Bone metastases ([place]) -  Followed by hospice -   Continue comfort care   Family/ staff Communication: Discussed plan of care with resident and brother on the phone.  Labs/tests ordered: Discussed plan of care with resident and charge nurse.  Goals of care:   Long-term care/hospice care   Durenda Age, DNP, MSN,  FNP-BC [facility] [phone] (Monday-Friday 8:00 a.m. - 5:00 p.m.) [phone] (after hours)

## 2021-01-18 ENCOUNTER — Non-Acute Institutional Stay (SKILLED_NURSING_FACILITY): Admitting: Internal Medicine

## 2021-01-18 ENCOUNTER — Encounter: Payer: Self-pay | Admitting: Internal Medicine

## 2021-01-18 DIAGNOSIS — D509 Iron deficiency anemia, unspecified: Secondary | ICD-10-CM

## 2021-01-18 DIAGNOSIS — E538 Deficiency of other specified B group vitamins: Secondary | ICD-10-CM

## 2021-01-18 DIAGNOSIS — C154 Malignant neoplasm of middle third of esophagus: Secondary | ICD-10-CM

## 2021-01-18 DIAGNOSIS — R6884 Jaw pain: Secondary | ICD-10-CM | POA: Diagnosis not present

## 2021-01-18 DIAGNOSIS — C61 Malignant neoplasm of prostate: Secondary | ICD-10-CM | POA: Diagnosis not present

## 2021-01-18 DIAGNOSIS — E43 Unspecified severe protein-calorie malnutrition: Secondary | ICD-10-CM

## 2021-01-18 DIAGNOSIS — E559 Vitamin D deficiency, unspecified: Secondary | ICD-10-CM

## 2021-01-18 NOTE — Assessment & Plan Note (Addendum)
11/23/2018 2H/H11.7/36.5 with MCV of 77 but normal MCHC.  Supplemental iron listed on med list.

## 2021-01-18 NOTE — Assessment & Plan Note (Addendum)
Follow-up was to be scheduled with Dr. Irene Limbo, Oncologist; but he has now transitioned to Hospice.

## 2021-01-18 NOTE — Patient Instructions (Signed)
See assessment and plan under each diagnosis in the problem list and acutely for this visit 

## 2021-01-18 NOTE — Assessment & Plan Note (Addendum)
Hospice will be notified to see if he is a candidate for imaging and possible additional radiation therapy to the painful area.  The pain is precluding nutritional intake except for desserts or ice cream.  Speech Therapy will initiate pured diet if Hospice allows.

## 2021-01-18 NOTE — Assessment & Plan Note (Addendum)
Surprisingly the most recent albumin and protein were normal and in fact the total protein was elevated prompting a serum protein electrophoresis panel. Marked atrophy of the calves is present.  The recent onset of pain in the mandible is precluding any significant oral nutritional intake.  Speech Therapy will initiate pured diet if Hospice authorizes

## 2021-01-18 NOTE — Progress Notes (Signed)
NURSING HOME LOCATION:  [facility] [place] NUMBER:  [id number] [phone]  PCP: [employer]   This is a nursing facility acute visit for R jaw pain with associated poor oral intake.  HPI: He remains in the facility although he had planned to move to [place] to be with family.  Medical diagnoses include prostate cancer with spinal metastases, esophageal cancer, ,sigmoid colon cancer with inconclusive biopsy , and essential hypertension, history of Paget's disease, . He has had a laminectomy and spinal cord decompression at T6.  He received radiation therapy to T6 and the esophagus. Unfortunately there was not significant improvement in the back pain with radiation therapy and he had some esophagitis following the esophageal radiation.  He did have some improvement in swallowing function following the radiation.  He has transitioned to [facility].  Most recent labs were 11/22/2020.  Despite his comorbidities and advanced disease; renal function was normal with a creatinine of 0.90 and GFR of greater than 90.  On 11/8 mild anemia was present with H/H of 11.7/36.5.  MCV was reduced to 77 but MCHC was normal.  In September his vitamin D level had been 15.28 and B12 level 140.  He is on supplemental iron; no B12 or vitamin D supplements listed in the Epic med list.  Most recent A1c was 10/02/2020 with a value of 6.5%.  Review of systems: As noted he previously had had a good response to the esophageal radiation with improved oral intake.  Speech Therapy states that diet was liberalized.  He now states that pain with mastication in the right posterior mandible precludes him eating anything except soft foods such as desserts or ice cream.  He states that the pain is exacerbated by chewing motion or yawning.  This persists despite an increase in his pain medications by the [facility] NP.  He denies any dysphagia at this time. He continues to have the bilateral back pain. Despite the  markedly low B12 level; he denies any paresthesias.  Physical exam:  Pertinent or positive findings: He appears somewhat cachectic.  Head is shaven.  The eyebrows are absent laterally.  He has dense arcus senilis.  He has only 6 remaining mandibular teeth but is otherwise edentulous.  There is no pain to palpation of the right lateral mandible or of TMJ , but he describes severe pain when he tried to open his mouth widely for evaluation.  There appears to be an olive sized , flesh colored mass in the right oropharyngeal area. Heart sounds are markedly distant.  A faint systolic murmur is suggested.  Radiation of such is heard in the epigastrium.  Breath sounds are decreased.  Feeding tube and ostomy bag are present.  The calves are markedly atrophic.  Posterior tibial pulses are stronger than dorsalis pedis pulses.  General appearance: no acute distress, increased work of breathing is present.   Lymphatic: No lymphadenopathy about the head, neck, axilla. Eyes: No conjunctival inflammation or lid edema is present. There is no scleral icterus. Ears:  External ear exam shows no significant lesions or deformities.   Nose:  External nasal examination shows no deformity or inflammation. Nasal mucosa are pink and moist without lesions, exudates Neck:  No thyromegaly, masses, tenderness noted.    Heart:  No gallop, click, rub .  Lungs:  without wheezes, rhonchi, rales, rubs. Abdomen: Bowel sounds are normal. Abdomen is soft and nontender with no organomegaly, hernias, masses. GU: Deferred  Extremities:  No cyanosis, clubbing,  edema  Neurologic exam :Balance, Rhomberg, finger to nose testing could not be completed due to clinical state Skin: Warm & dry w/o tenting. No significant lesions or rash.  See summary under each active problem in the Problem List with associated updated therapeutic plan

## 2021-01-18 NOTE — Assessment & Plan Note (Signed)
He denies any paresthesias. Supplementation deferred to Hospice.

## 2021-01-18 NOTE — Assessment & Plan Note (Addendum)
Vitamin D supplementation deferred to Hospice

## 2021-01-19 NOTE — Assessment & Plan Note (Signed)
Dysphagia denied but oropharyngeal / mandibular pain with yawning or mastication  in context of R posterior oropharyngeal mass precludes ingestion of solid food. Speech Therapy can order puree diet if [facility] authorizes such.

## 2021-01-24 ENCOUNTER — Other Ambulatory Visit: Payer: Self-pay | Admitting: Adult Health

## 2021-01-24 MED ORDER — OXYCODONE HCL 10 MG/0.5ML PO CONC: 5.0000 mg | Freq: Four times a day (QID) | ORAL | 0 refills | Status: DC

## 2021-01-31 ENCOUNTER — Encounter: Payer: Self-pay | Admitting: Adult Health

## 2021-01-31 ENCOUNTER — Non-Acute Institutional Stay (SKILLED_NURSING_FACILITY): Payer: Medicare Other | Admitting: Adult Health

## 2021-01-31 DIAGNOSIS — C154 Malignant neoplasm of middle third of esophagus: Secondary | ICD-10-CM | POA: Diagnosis not present

## 2021-01-31 DIAGNOSIS — R1319 Other dysphagia: Secondary | ICD-10-CM

## 2021-01-31 DIAGNOSIS — C7951 Secondary malignant neoplasm of bone: Secondary | ICD-10-CM

## 2021-01-31 DIAGNOSIS — R6884 Jaw pain: Secondary | ICD-10-CM

## 2021-01-31 DIAGNOSIS — C187 Malignant neoplasm of sigmoid colon: Secondary | ICD-10-CM | POA: Diagnosis not present

## 2021-01-31 DIAGNOSIS — C61 Malignant neoplasm of prostate: Secondary | ICD-10-CM

## 2021-01-31 DIAGNOSIS — D509 Iron deficiency anemia, unspecified: Secondary | ICD-10-CM

## 2021-01-31 DIAGNOSIS — E43 Unspecified severe protein-calorie malnutrition: Secondary | ICD-10-CM

## 2021-01-31 DIAGNOSIS — Z7189 Other specified counseling: Secondary | ICD-10-CM | POA: Diagnosis not present

## 2021-01-31 NOTE — Progress Notes (Signed)
Location:  [place] Room Number: 108-A Place of Service:  SNF (31) Provider:  Durenda Age, DNP, FNP-BC  Patient Care Team: Hendricks Limes, MD as PCP - General (Internal Medicine)  Extended Emergency Contact Information Primary Emergency Contact: perry,albert (*Brother in law) Mobile Phone: [phone] Relation: Relative  Code Status:  Full Code   Goals of care: Advanced Directive information Advanced Directives 01/31/2021  Does Patient Have a Medical Advance Directive? Yes  Type of Paramedic of [place];Living will  Does patient want to make changes to medical advance directive? No - Patient declined  Copy of [place] in Chart? Yes - validated most recent copy scanned in chart (See row information)  Would patient like information on creating a medical advance directive? -     Chief Complaint  Patient presents with   Advanced Directive    Advance Care Planning     HPI:  Pt is a 76 y.o. male who had a care plan meeting today attended by resident, ex-wife, daughter, social worker X 2,  NP, hospice nurse and [facility]. He remains to be full code. Discussed medications with, vital signs and weights. He complained of having jaw pain which interferes with his eating, 9/10. He has esophageal cancer with bone metastasis. He takes oxycodone 5 mg every 6 hours and 5 mg every 6 hours PRN.  Family requested for me to get out of the daily.  He participates in restorative walking as tolerated twice a week, walking 25 to 100 feet. Family asked patient his food preferences that they can bring. Colostomy care was discussed. The meeting lasted for 30 minutes.   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    Prostate cancer ([place]) 09/04/13    Adenocarcinoma   Past Surgical History:  Procedure Laterality Date   BIOPSY  09/26/2020   Procedure: BIOPSY;  Surgeon: Ronnette Juniper, MD;  [facility];  Service: Gastroenterology;;   BIOPSY  09/28/2020   Procedure: BIOPSY;  Surgeon: Ronnette Juniper, MD;  [facility];  Service: Gastroenterology;;   COLON RESECTION N/A 09/30/2020   Procedure: HAND ASSISTED LAPAROSCOPIC HARTMAN'S PROCEDURE WITH END COLOSTOMY; TAP BLOCK, PLACEMENT OF PEG TUBE;  Surgeon: Greer Pickerel, MD;  [facility];  Service: General;  Laterality: N/A;   ESOPHAGOGASTRODUODENOSCOPY (EGD) WITH PROPOFOL N/A 09/26/2020   Procedure: ESOPHAGOGASTRODUODENOSCOPY (EGD) WITH PROPOFOL;  Surgeon: Ronnette Juniper, MD;  [facility];  Service: Gastroenterology;  Laterality: N/A;   FLEXIBLE SIGMOIDOSCOPY N/A 09/28/2020   Procedure: FLEXIBLE SIGMOIDOSCOPY;  Surgeon: Ronnette Juniper, MD;  [facility];  Service: Gastroenterology;  Laterality: N/A;   LAMINECTOMY N/A 09/18/2020   Procedure: THORACIC LAMINECTOMY FOR TUMOR;  Surgeon: Newman Pies, MD;  Location: [facility];  Service: Neurosurgery;  Laterality: N/A;   LYMPHADENECTOMY Bilateral 09/04/2013   Procedure: PELVIC LYMPH NODE DISSECTION;  Surgeon: Alexis Frock, MD;  [facility];  Service: Urology;  Laterality: Bilateral;   PORTACATH PLACEMENT N/A 10/11/2020   Procedure: INSERTION PORT-A-CATH;  Surgeon: Armandina Gemma, MD;  [facility];  Service: General;  Laterality: N/A;  60 MINUTES ROOM 4   ROBOT ASSISTED LAPAROSCOPIC RADICAL PROSTATECTOMY N/A 09/04/2013   Procedure: ROBOTIC ASSISTED LAPAROSCOPIC RADICAL PROSTATECTOMY, INDOCYANINE GREEN DYE;  Surgeon: Hubbard Robinson  Tresa Moore, MD;  [facility];  Service: Urology;  Laterality: N/A;   SUBMUCOSAL TATTOO INJECTION  09/28/2020   Procedure: SUBMUCOSAL TATTOO INJECTION;  Surgeon: Ronnette Juniper, MD;  [facility];  Service: Gastroenterology;;   TOTAL HIP ARTHROPLASTY Right 05/15/2016   Procedure: RIGHT TOTAL HIP  ARTHROPLASTY ANTERIOR APPROACH;  Surgeon: Paralee Cancel, MD;  [facility];  Service: Orthopedics;  Laterality: Right;   WRIST GANGLION EXCISION      No Known Allergies  Outpatient Encounter Medications as of 01/31/2021  Medication Sig   acetaminophen (TYLENOL) 160 MG/5ML solution Place 20.3 mLs (650 mg total) into feeding tube every 6 (six) hours as needed for mild pain.   Amino Acids-Protein Hydrolys (PRO-STAT MAX) LIQD Give 30 mLs by tube 2 (two) times daily. for supplement   ferrous sulfate 220 (44 Fe) MG/5ML solution Place 300 mg into feeding tube daily.   mirtazapine (REMERON) 15 MG tablet Place 15 mg into feeding tube at bedtime.   Multiple Vitamin (MULTIVITAMIN) LIQD Place 10 mLs into feeding tube daily.   Nutritional Supplements (FEEDING SUPPLEMENT, OSMOLITE 1.5 CAL,) LIQD 1 CARTON 5 TIMES PER DAY WITH 50ML BEFORE AND AFTER EACH BOLUS.   Nutritional Supplements (NUTRITIONAL SUPPLEMENT PO) Take by mouth in the morning and at bedtime. Magic Cup.   ondansetron (ZOFRAN) 4 MG tablet Give 1 tab per peg tube every 8 hours for nausea/vomiting   oxyCODONE HCl 10 MG/0.5ML CONC Take 5 mg by mouth every 6 (six) hours. Give 5MG =0.25 EVERY SIX HOURS PO/PEG SCHEDULED PAIN MANGEMENT.   oxyCODONE HCl 10 MG/0.5ML CONC Take 5 mg by mouth every 6 (six) hours as needed (for pain management).   polyethylene glycol (MIRALAX / GLYCOLAX) 17 g packet Take 17 g by mouth daily.   Water For Irrigation, Sterile (FREE WATER) SOLN Place 100 mLs into feeding tube every 8 (eight) hours. 50 ml free water flush before and after each TF bolus given   [DISCONTINUED] ferrous sulfate 300 (60 Fe) MG/5ML syrup Place 5 mLs (300 mg total) into feeding tube daily.   [DISCONTINUED] oxyCODONE (ROXICODONE) 5 MG/5ML solution Place 5 mLs (5 mg total) into feeding tube every 4 (four) hours as needed for severe pain (cancer related pain).   No facility-administered encounter medications on file as of 01/31/2021.    Review of  Systems  Constitutional:  Negative for activity change, chills and fever.  HENT:  Negative for sore throat.   Eyes: Negative.   Cardiovascular:  Negative for chest pain and leg swelling.  Gastrointestinal:  Positive for abdominal pain. Negative for abdominal distention, diarrhea and vomiting.  Genitourinary:  Negative for dysuria, frequency and urgency.  Skin:  Negative for color change.  Neurological:  Negative for dizziness and headaches.  Psychiatric/Behavioral:  Negative for behavioral problems and sleep disturbance. The patient is not nervous/anxious.       Immunization History  Administered Date(s) Administered   Influenza-Unspecified 08/16/2019   PFIZER(Purple Top)SARS-COV-2 Vaccination 02/16/2019, 03/16/2019   Pfizer Covid-19 Vaccine Bivalent Booster 45yrs & up 10/15/2020   Pneumococcal Conjugate-13 09/16/2019   Pneumococcal Polysaccharide-23 09/05/2013   Tdap 10/15/2017   Pertinent  Health Maintenance Due  Topic Date Due   COLONOSCOPY (Pts 45-42yrs Insurance coverage will need to be confirmed)  Never done   INFLUENZA VACCINE  08/15/2020   Fall Risk 10/27/2020 10/28/2020 10/28/2020 11/30/2020 11/30/2020  Falls in the past year? - - - - -  Patient Fall Risk Level Moderate fall risk Moderate fall risk Moderate fall risk High fall  risk High fall risk     Vitals:   01/31/21 0926  BP: 124/88  Pulse: 95  Resp: (!) 21  Temp: 97.7 F (36.5 C)  Weight: 125 lb 6.4 oz (56.9 kg)  Height: 5\' 10"  (1.778 m)   Body mass index is 17.99 kg/m.  Physical Exam Constitutional:      Comments: Cachectic.  HENT:     Head: Normocephalic and atraumatic.     Mouth/Throat:     Mouth: Mucous membranes are moist.  Eyes:     Conjunctiva/sclera: Conjunctivae normal.  Cardiovascular:     Rate and Rhythm: Normal rate and regular rhythm.     Pulses: Normal pulses.     Heart sounds: Normal heart sounds.  Pulmonary:     Effort: Pulmonary effort is normal.     Breath sounds: Normal  breath sounds.  Abdominal:     General: Bowel sounds are normal.     Palpations: Abdomen is soft.     Comments: Has peg tube and colostomy intact  Musculoskeletal:        General: No swelling. Normal range of motion.     Cervical back: Normal range of motion.  Skin:    General: Skin is warm and dry.  Neurological:     Mental Status: He is alert and oriented to person, place, and time.  Psychiatric:        Mood and Affect: Mood normal.        Behavior: Behavior normal.        Thought Content: Thought content normal.        Judgment: Judgment normal.       Labs reviewed: Recent Labs    10/18/20 0503 10/18/20 1551 10/19/20 0507 10/19/20 1459 10/19/20 1524 10/21/20 1536 10/23/20 1829 10/25/20 0440 11/04/20 0000 11/08/20 1018 11/22/20 1040  NA 137  --   --    < >  --    < > 132*  --  137 135 135  K 4.4  --   --    < >  --    < > 4.1  --  4.6 3.8 4.0  CL 107  --   --    < >  --    < > 99  --  100 99 99  CO2 23  --   --    < >  --    < > 27  --  24* 25 25  GLUCOSE 111*  --   --    < >  --    < > 145*  --   --  209* 131*  BUN 9  --   --    < >  --    < > 18  --  20 15 12   CREATININE 0.63  --   --    < >  --    < > 0.71   < > 0.7 0.94 0.90  CALCIUM 8.9  --   --    < >  --    < > 9.0  --  9.3 9.7 9.9  MG 1.8 2.2 2.2  --  1.9  --   --   --   --   --   --   PHOS 3.1 3.2 4.3  --   --   --   --   --   --   --   --    < > = values in this interval not displayed.  Recent Labs    10/16/20 0527 11/08/20 1018 11/22/20 1040  AST 13* 51* 33  ALT 10 74* 39  ALKPHOS 114 148* 131*  BILITOT 0.4 0.6 0.3  PROT 6.2* 8.0 8.5*  ALBUMIN 2.7* 3.3* 3.6   Recent Labs    10/24/20 1605 11/04/20 0000 11/08/20 1018 11/22/20 1040  WBC 4.4 4.9 5.6 4.0  NEUTROABS  --  2.40 3.1 2.6  HGB 9.5* 10.3* 11.3* 11.7*  HCT 29.5* 31* 35.8* 36.5*  MCV 80.2  --  79.0* 77.0*  PLT 273 220 217 248   No results found for: TSH Lab Results  Component Value Date   HGBA1C 6.5 (H) 10/02/2020   Lab  Results  Component Value Date   CHOL 197 07/14/2009   HDL 55 07/14/2009   LDLCALC 130 (H) 07/14/2009   TRIG 62 07/14/2009   CHOLHDL 3.6 Ratio 07/14/2009    Significant Diagnostic Results in last 30 days:  No results found.  Assessment/Plan  1. ACP (advance care planning) -   Remains to be full code -    Discussed medications, vital signs and weights  2. 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 Protein-calorie malnutrition, severe -  discussed his food preferences and what family can bring -   Continue Magic cup, Protostat and Ensure Enlive  4. Jaw pain -  will increase oxycodone from 5 mg every 6 hours to 5 mg every 4 hours and continue oxycodone 5 mg every 6 hours PRN  5. Esophageal dysphagia -  Continue mechanical soft diet -   Discussed food preferences  6. Microcytic anemia Lab Results  Component Value Date   WBC 4.0 11/22/2020   HGB 11.7 (L) 11/22/2020   HCT 36.5 (L) 11/22/2020   MCV 77.0 (L) 11/22/2020   PLT 248 11/22/2020   -    Continue ferrous sulfate 300 mg daily   Family/ staff Communication: Discussed plan of care with resident, family and IDT.   Labs/tests ordered:    None    Durenda Age, DNP, MSN, FNP-BC [facility] and Adult Medicine [phone] (Monday-Friday 8:00 a.m. - 5:00 p.m.) [phone] (after hours)

## 2021-02-20 ENCOUNTER — Encounter: Payer: Self-pay | Admitting: Adult Health

## 2021-02-20 ENCOUNTER — Non-Acute Institutional Stay (SKILLED_NURSING_FACILITY): Payer: Medicare Other | Admitting: Adult Health

## 2021-02-20 DIAGNOSIS — C154 Malignant neoplasm of middle third of esophagus: Secondary | ICD-10-CM

## 2021-02-20 DIAGNOSIS — G893 Neoplasm related pain (acute) (chronic): Secondary | ICD-10-CM | POA: Diagnosis not present

## 2021-02-20 DIAGNOSIS — C61 Malignant neoplasm of prostate: Secondary | ICD-10-CM

## 2021-02-20 DIAGNOSIS — R141 Gas pain: Secondary | ICD-10-CM

## 2021-02-20 DIAGNOSIS — D500 Iron deficiency anemia secondary to blood loss (chronic): Secondary | ICD-10-CM | POA: Diagnosis not present

## 2021-02-20 DIAGNOSIS — C7951 Secondary malignant neoplasm of bone: Secondary | ICD-10-CM

## 2021-02-20 NOTE — Progress Notes (Signed)
Location:  [place] Room Number: [place] of Service:  SNF (31) Provider:  Durenda Age, DNP, FNP-BC  Patient Care Team: Hendricks Limes, MD as PCP - General (Internal Medicine)  Extended Emergency Contact Information Primary Emergency Contact: perry,albert (*Brother in law) Mobile Phone: [phone] Relation: Relative  Code Status:   Full Code  Goals of care: Advanced Directive information Advanced Directives 01/31/2021  Does Patient Have a Medical Advance Directive? Yes  Type of Paramedic of [place];Living will  Does patient want to make changes to medical advance directive? No - Patient declined  Copy of [place] in Chart? Yes - validated most recent copy scanned in chart (See row information)  Would patient like information on creating a medical advance directive? -     Chief Complaint  Patient presents with   Medical Management of Chronic Issues    Routine Visit    HPI:  Pt is a 76 y.o. male seen today for medical management of chronic diseases. He is a long-term care resident of [facility]. 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 He has been refusing bolus feedings due to abdominal pain. He had weight loss 8.45%, 10.6 lbs, in 30 days.  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    Prostate cancer ([place]) 09/04/13   Adenocarcinoma   Past Surgical History:  Procedure Laterality Date   BIOPSY  09/26/2020   Procedure: BIOPSY;  Surgeon: Ronnette Juniper, MD;  [facility];  Service: Gastroenterology;;   BIOPSY  09/28/2020   Procedure: BIOPSY;  Surgeon: Ronnette Juniper, MD;  [facility];  Service: Gastroenterology;;   COLON RESECTION N/A 09/30/2020   Procedure: HAND ASSISTED LAPAROSCOPIC HARTMAN'S PROCEDURE WITH END COLOSTOMY; TAP BLOCK, PLACEMENT OF PEG TUBE;  Surgeon: Greer Pickerel, MD;  [facility];  Service: General;  Laterality: N/A;   ESOPHAGOGASTRODUODENOSCOPY (EGD) WITH PROPOFOL N/A 09/26/2020   Procedure: ESOPHAGOGASTRODUODENOSCOPY (EGD) WITH PROPOFOL;  Surgeon: Ronnette Juniper, MD;  [facility];  Service: Gastroenterology;  Laterality: N/A;   FLEXIBLE SIGMOIDOSCOPY N/A 09/28/2020   Procedure: FLEXIBLE SIGMOIDOSCOPY;  Surgeon: Ronnette Juniper, MD;  [facility];  Service: Gastroenterology;  Laterality: N/A;   LAMINECTOMY N/A 09/18/2020   Procedure: THORACIC LAMINECTOMY FOR TUMOR;  Surgeon: Newman Pies, MD;  Location: [facility];  Service: Neurosurgery;  Laterality: N/A;   LYMPHADENECTOMY Bilateral 09/04/2013   Procedure: PELVIC LYMPH NODE DISSECTION;  Surgeon: Alexis Frock, MD;  [facility];  Service: Urology;  Laterality: Bilateral;   PORTACATH PLACEMENT N/A 10/11/2020   Procedure: INSERTION PORT-A-CATH;  Surgeon: Armandina Gemma, MD;  [facility];  Service: General;  Laterality: N/A;  60 MINUTES ROOM 4   ROBOT  ASSISTED LAPAROSCOPIC RADICAL PROSTATECTOMY N/A 09/04/2013   Procedure: ROBOTIC ASSISTED LAPAROSCOPIC RADICAL PROSTATECTOMY, INDOCYANINE GREEN DYE;  Surgeon: Alexis Frock, MD;  [facility];  Service: Urology;  Laterality: N/A;   SUBMUCOSAL TATTOO INJECTION  09/28/2020   Procedure: SUBMUCOSAL TATTOO INJECTION;  Surgeon: Ronnette Juniper, MD;  [facility];  Service: Gastroenterology;;   TOTAL HIP ARTHROPLASTY Right 05/15/2016   Procedure: RIGHT TOTAL HIP ARTHROPLASTY ANTERIOR APPROACH;  Surgeon: Paralee Cancel, MD;  [facility];  Service: Orthopedics;  Laterality: Right;   WRIST GANGLION EXCISION      No Known Allergies  Outpatient Encounter Medications as of 02/20/2021  Medication Sig   acetaminophen (TYLENOL) 160 MG/5ML solution Place 20.3 mLs (650 mg total) into feeding tube every 6 (six) hours as needed for mild pain.   Amino Acids-Protein Hydrolys (PRO-STAT MAX) LIQD Give 30 mLs by tube 2 (two) times daily. for supplement   ferrous sulfate 220 (44 Fe) MG/5ML solution Place 300 mg into feeding tube daily.   mirtazapine (REMERON) 15 MG tablet Place 15 mg into feeding tube at bedtime.   Multiple Vitamin (MULTIVITAMIN) LIQD Place 10 mLs into feeding tube daily.   Nutritional Supplements (FEEDING SUPPLEMENT, OSMOLITE 1.5 CAL,) LIQD 1 CARTON 5 TIMES PER DAY WITH 50ML BEFORE AND AFTER EACH BOLUS.   Nutritional Supplements (NUTRITIONAL SUPPLEMENT PO) Take by mouth in the morning and at bedtime. Magic Cup.   ondansetron (ZOFRAN) 4 MG tablet Give 1 tab per peg tube every 8 hours for nausea/vomiting   oxyCODONE HCl 10 MG/0.5ML CONC Take 5 mg by mouth every 6 (six) hours. Give 5MG =0.25 EVERY SIX HOURS PO/PEG SCHEDULED PAIN MANGEMENT.   oxyCODONE HCl 10 MG/0.5ML CONC Take 5 mg by mouth every 6 (six) hours as needed (for pain management).   polyethylene glycol (MIRALAX / GLYCOLAX) 17 g packet Take 17 g by mouth daily.   Water For Irrigation, Sterile (FREE WATER) SOLN Place 100 mLs into feeding tube every 8 (eight) hours. 50 ml free water flush before and after each TF bolus given   No facility-administered encounter medications on file as of 02/20/2021.    Review of Systems  Constitutional:  Negative for activity change and fever.  HENT:  Negative for sore throat.   Eyes: Negative.   Cardiovascular:  Negative for chest pain and leg  swelling.  Gastrointestinal:  Positive for abdominal pain. Negative for abdominal distention, diarrhea and vomiting.  Genitourinary:  Negative for dysuria, frequency and urgency.  Skin:  Negative for color change.  Neurological:  Negative for dizziness and headaches.  Psychiatric/Behavioral:  Negative for behavioral problems and sleep disturbance. The patient is not nervous/anxious.       Immunization History  Administered Date(s) Administered   Influenza-Unspecified 08/16/2019   PFIZER(Purple Top)SARS-COV-2 Vaccination 02/16/2019, 03/16/2019   Pfizer Covid-19 Vaccine Bivalent Booster 36yrs & up 10/15/2020   Pneumococcal Conjugate-13 09/16/2019   Pneumococcal Polysaccharide-23 09/05/2013   Tdap 10/15/2017   Pertinent  Health Maintenance Due  Topic Date Due   COLONOSCOPY (Pts 45-39yrs Insurance coverage will need to be confirmed)  Never done   INFLUENZA VACCINE  08/15/2020   Fall Risk 10/27/2020 10/28/2020 10/28/2020 11/30/2020 11/30/2020  Falls in the past year? - - - - -  Patient Fall Risk Level Moderate fall risk Moderate fall risk Moderate fall risk High fall risk High fall risk     Vitals:   02/20/21 1437  BP: 103/61  Pulse: 86  Resp: 18  Temp: 97.6 F (36.4 C)  Weight:  114 lb 12.8 oz (52.1 kg)  Height: 5\' 10"  (1.778 m)   Body mass index is 16.47 kg/m.  Physical Exam Constitutional:      General: He is not in acute distress.    Comments: Cachectic.  HENT:     Head: Normocephalic and atraumatic.     Mouth/Throat:     Mouth: Mucous membranes are moist.  Eyes:     Conjunctiva/sclera: Conjunctivae normal.  Cardiovascular:     Rate and Rhythm: Normal rate and regular rhythm.     Pulses: Normal pulses.     Heart sounds: Normal heart sounds.  Pulmonary:     Effort: Pulmonary effort is normal.     Breath sounds: Normal breath sounds.  Abdominal:     General: Bowel sounds are normal.     Palpations: Abdomen is soft.     Comments: Has peg tube and colostomy   Musculoskeletal:        General: No swelling. Normal range of motion.     Cervical back: Normal range of motion.  Skin:    General: Skin is warm and dry.  Neurological:     Mental Status: He is alert and oriented to person, place, and time. Mental status is at baseline.  Psychiatric:        Mood and Affect: Mood normal.        Behavior: Behavior normal.        Thought Content: Thought content normal.        Judgment: Judgment normal.      Labs reviewed: Recent Labs    10/18/20 0503 10/18/20 1551 10/19/20 0507 10/19/20 1459 10/19/20 1524 10/21/20 1536 10/23/20 1829 10/25/20 0440 11/04/20 0000 11/08/20 1018 11/22/20 1040  NA 137  --   --    < >  --    < > 132*  --  137 135 135  K 4.4  --   --    < >  --    < > 4.1  --  4.6 3.8 4.0  CL 107  --   --    < >  --    < > 99  --  100 99 99  CO2 23  --   --    < >  --    < > 27  --  24* 25 25  GLUCOSE 111*  --   --    < >  --    < > 145*  --   --  209* 131*  BUN 9  --   --    < >  --    < > 18  --  20 15 12   CREATININE 0.63  --   --    < >  --    < > 0.71   < > 0.7 0.94 0.90  CALCIUM 8.9  --   --    < >  --    < > 9.0  --  9.3 9.7 9.9  MG 1.8 2.2 2.2  --  1.9  --   --   --   --   --   --   PHOS 3.1 3.2 4.3  --   --   --   --   --   --   --   --    < > = values in this interval not displayed.   Recent Labs    10/16/20 0527 11/08/20 1018 11/22/20 1040  AST 13* 51* 33  ALT  10 74* 39  ALKPHOS 114 148* 131*  BILITOT 0.4 0.6 0.3  PROT 6.2* 8.0 8.5*  ALBUMIN 2.7* 3.3* 3.6   Recent Labs    10/24/20 1605 11/04/20 0000 11/08/20 1018 11/22/20 1040  WBC 4.4 4.9 5.6 4.0  NEUTROABS  --  2.40 3.1 2.6  HGB 9.5* 10.3* 11.3* 11.7*  HCT 29.5* 31* 35.8* 36.5*  MCV 80.2  --  79.0* 77.0*  PLT 273 220 217 248   No results found for: TSH Lab Results  Component Value Date   HGBA1C 6.5 (H) 10/02/2020   Lab Results  Component Value Date   CHOL 197 07/14/2009   HDL 55 07/14/2009   LDLCALC 130 (H) 07/14/2009   TRIG 62  07/14/2009   CHOLHDL 3.6 Ratio 07/14/2009    Significant Diagnostic Results in last 30 days:  No results found.  Assessment/Plan  1. Gas pain -  will start on Simethicone 125 mg TID PRN  2. Anemia due to chronic blood loss Lab Results  Component Value Date   WBC 4.0 11/22/2020   HGB 11.7 (L) 11/22/2020   HCT 36.5 (L) 11/22/2020   MCV 77.0 (L) 11/22/2020   PLT 248 11/22/2020   -  continue ferrous sulfate  3. Chronic pain due to neoplasm -   Stable, continue oxycodone  4. 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[place]) Bone metastases ([place]) Prostate cancer ([place]) -   continue Oxycodone -  followed by hospice    Family/ staff Communication: Discussed plan of care with resident and charge nurse  Labs/tests ordered:   None    Durenda Age, DNP, MSN, FNP-BC [facility] and Adult Medicine [phone] (Monday-Friday 8:00 a.m. - 5:00 p.m.) [phone] (after hours)

## 2021-03-07 ENCOUNTER — Other Ambulatory Visit: Payer: Self-pay | Admitting: Adult Health

## 2021-03-07 MED ORDER — OXYCODONE HCL 10 MG/0.5ML PO CONC: 5.0000 mg | Freq: Four times a day (QID) | ORAL | 0 refills | Status: DC | PRN

## 2021-03-07 MED ORDER — OXYCODONE HCL 10 MG/0.5ML PO CONC: 5.0000 mg | ORAL | 0 refills | Status: DC

## 2021-03-15 ENCOUNTER — Encounter: Payer: Self-pay | Admitting: Adult Health

## 2021-03-15 ENCOUNTER — Non-Acute Institutional Stay (SKILLED_NURSING_FACILITY): Payer: Medicare Other | Admitting: Adult Health

## 2021-03-15 DIAGNOSIS — C61 Malignant neoplasm of prostate: Secondary | ICD-10-CM

## 2021-03-15 DIAGNOSIS — G893 Neoplasm related pain (acute) (chronic): Secondary | ICD-10-CM | POA: Diagnosis not present

## 2021-03-15 DIAGNOSIS — R630 Anorexia: Secondary | ICD-10-CM | POA: Diagnosis not present

## 2021-03-15 DIAGNOSIS — C187 Malignant neoplasm of sigmoid colon: Secondary | ICD-10-CM

## 2021-03-15 DIAGNOSIS — R1319 Other dysphagia: Secondary | ICD-10-CM

## 2021-03-15 DIAGNOSIS — C154 Malignant neoplasm of middle third of esophagus: Secondary | ICD-10-CM

## 2021-03-15 NOTE — Progress Notes (Signed)
Location:  [facility] Room Number: 108-A Place of Service:  SNF (31) Provider:  Durenda Age, DNP, FNP-BC  Patient Care Team: Hendricks Limes, MD as PCP - General (Internal Medicine)  Extended Emergency Contact Information Primary Emergency Contact: perry,albert (*Brother in law) Mobile Phone: [phone] Relation: Relative  Code Status:  Full Code   Goals of care: Advanced Directive information Advanced Directives 03/15/2021  Does Patient Have a Medical Advance Directive? Yes  Type of Paramedic of [place];Living will  Does patient want to make changes to medical advance directive? No - Patient declined  Copy of [place] in Chart? Yes - validated most recent copy scanned in chart (See row information)  Would patient like information on creating a medical advance directive? -     Chief Complaint  Patient presents with   Medical Management of Chronic Issues    Routine Visit    HPI:  Pt is a 76 y.o. male seen today for medical management of chronic diseases. 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  He is a long-term care resident of [facility].  He is currently being followed by hospice/comfort care.  Esophageal dysphagia -on mechanical soft and may have regular diet per request, has PEG tube for Osmolite 1.5 cal 1 carton 5X/day  Chronic pain due to neoplasm  -  takes oxycodone 5 mg SL every 4 hours, acetaminophen 650 mg every 6 hours PRN, oxycodone 5 mg every 6 hours PRN  Poor appetite  -  Body mass index is 16.36 kg/m.  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    Prostate cancer ([place]) 09/04/13   Adenocarcinoma   Past Surgical History:  Procedure Laterality Date   BIOPSY  09/26/2020   Procedure: BIOPSY;  Surgeon: Ronnette Juniper, MD;  Location: [facility];  Service: Gastroenterology;;   BIOPSY  09/28/2020   Procedure: BIOPSY;  Surgeon: Ronnette Juniper, MD;  [facility]: [facility];  Service: Gastroenterology;;   COLON RESECTION N/A 09/30/2020   Procedure: HAND ASSISTED LAPAROSCOPIC HARTMAN'S PROCEDURE WITH END COLOSTOMY; TAP BLOCK, PLACEMENT OF PEG TUBE;  Surgeon: Greer Pickerel, MD;  [facility];  Service: General;  Laterality: N/A;   ESOPHAGOGASTRODUODENOSCOPY (EGD) WITH PROPOFOL N/A 09/26/2020   Procedure: ESOPHAGOGASTRODUODENOSCOPY (EGD) WITH PROPOFOL;  Surgeon: Ronnette Juniper, MD;  [facility];  Service: Gastroenterology;  Laterality: N/A;   FLEXIBLE SIGMOIDOSCOPY N/A 09/28/2020   Procedure: FLEXIBLE SIGMOIDOSCOPY;  Surgeon: Ronnette Juniper, MD;  [facility];  Service: Gastroenterology;  Laterality: N/A;   LAMINECTOMY N/A 09/18/2020   Procedure: THORACIC LAMINECTOMY FOR TUMOR;  Surgeon: Newman Pies, MD;  Location: [facility];  Service: Neurosurgery;  Laterality: N/A;   LYMPHADENECTOMY Bilateral 09/04/2013   Procedure: PELVIC LYMPH NODE DISSECTION;  Surgeon: Alexis Frock, MD;  [facility];  Service: Urology;  Laterality: Bilateral;   PORTACATH PLACEMENT N/A 10/11/2020   Procedure: INSERTION  PORT-A-CATH;  Surgeon: Armandina Gemma, MD;  [facility];  Service: General;  Laterality: N/A;  60 MINUTES ROOM 4   ROBOT ASSISTED LAPAROSCOPIC RADICAL PROSTATECTOMY N/A 09/04/2013   Procedure: ROBOTIC ASSISTED LAPAROSCOPIC RADICAL PROSTATECTOMY, INDOCYANINE GREEN DYE;  Surgeon: Alexis Frock, MD;  [facility];  Service:  Urology;  Laterality: N/A;   SUBMUCOSAL TATTOO INJECTION  09/28/2020   Procedure: SUBMUCOSAL TATTOO INJECTION;  Surgeon: Ronnette Juniper, MD;  Location: [facility];  Service: Gastroenterology;;   TOTAL HIP ARTHROPLASTY Right 05/15/2016   Procedure: RIGHT TOTAL HIP ARTHROPLASTY ANTERIOR APPROACH;  Surgeon: Paralee Cancel, MD;  [facility];  Service: Orthopedics;  Laterality: Right;   WRIST GANGLION EXCISION      No Known Allergies  Outpatient Encounter Medications as of 03/15/2021  Medication Sig   acetaminophen (TYLENOL) 160 MG/5ML solution Place 20.3 mLs (650 mg total) into feeding tube every 6 (six) hours as needed for mild pain.   Amino Acids-Protein Hydrolys (PRO-STAT MAX) LIQD Give 30 mLs by tube 2 (two) times daily. for supplement   ferrous sulfate 220 (44 Fe) MG/5ML solution Place 300 mg into feeding tube daily.   mirtazapine (REMERON) 15 MG tablet Place 15 mg into feeding tube at bedtime.   Multiple Vitamin (MULTIVITAMIN) LIQD Place 10 mLs into feeding tube daily.   Nutritional Supplements (ENSURE ENLIVE PO) Take 1 Can by mouth 3 (three) times daily.   Nutritional Supplements (FEEDING SUPPLEMENT, OSMOLITE 1.5 CAL,) LIQD 1 CARTON 5 TIMES PER DAY WITH 50ML BEFORE AND AFTER EACH BOLUS.   Nutritional Supplements (NUTRITIONAL SUPPLEMENT PO) Take by mouth in the morning and at bedtime. Magic Cup.   ondansetron (ZOFRAN) 4 MG tablet Give 1 tab per peg tube every 8 hours for nausea/vomiting   oxyCODONE HCl 10 MG/0.5ML CONC Take 5 mg by mouth every 4 (four) hours. Give 5MG =0.25 EVERY FOUR HOURS PO/PEG SCHEDULED PAIN MANGEMENT.   oxyCODONE HCl 10 MG/0.5ML CONC Take 5 mg by mouth every 6 (six) hours as needed (for pain management).   polyethylene glycol (MIRALAX / GLYCOLAX) 17 g packet Take 17 g by mouth daily.   simethicone (MYLICON) 119 MG chewable tablet Place 125 mg into feeding tube 3 (three) times daily as needed for flatulence.   Water For Irrigation, Sterile (FREE WATER) SOLN Place 100 mLs  into feeding tube every 8 (eight) hours. 50 ml free water flush before and after each TF bolus given   No facility-administered encounter medications on file as of 03/15/2021.    Review of Systems  Constitutional:  Negative for activity change and fever.  HENT:  Negative for sore throat.   Eyes: Negative.   Cardiovascular:  Negative for chest pain and leg swelling.  Gastrointestinal:  Negative for abdominal distention, diarrhea and vomiting.  Genitourinary:  Negative for dysuria, frequency and urgency.  Skin:  Negative for color change.  Neurological:  Negative for dizziness and headaches.  Psychiatric/Behavioral:  Negative for behavioral problems and sleep disturbance. The patient is not nervous/anxious.       Immunization History  Administered Date(s) Administered   Influenza-Unspecified 08/16/2019   PFIZER(Purple Top)SARS-COV-2 Vaccination 02/16/2019, 03/16/2019   Pfizer Covid-19 Vaccine Bivalent Booster 71yrs & up 10/15/2020   Pneumococcal Conjugate-13 09/16/2019   Pneumococcal Polysaccharide-23 09/05/2013   Tdap 10/15/2017   Zoster Recombinat (Shingrix) 09/16/2019   Pertinent  Health Maintenance Due  Topic Date Due   COLONOSCOPY (Pts 45-65yrs Insurance coverage will need to be confirmed)  Never done   INFLUENZA VACCINE  08/15/2020  Fall Risk 10/27/2020 10/28/2020 10/28/2020 11/30/2020 11/30/2020  Falls in the past year? - - - - -  Patient Fall Risk Level Moderate fall risk Moderate fall risk Moderate fall risk High fall risk High fall risk     Vitals:   03/15/21 1203  BP: 122/67  Pulse: 83  Resp: 20  Temp: (!) 97.3 F (36.3 C)  Weight: 114 lb (51.7 kg)  Height: 5\' 10"  (1.778 m)   Body mass index is 16.36 kg/m.  Physical Exam Constitutional:      Appearance: Normal appearance.     Comments: Cachectic,  HENT:     Head: Normocephalic and atraumatic.     Mouth/Throat:     Mouth: Mucous membranes are moist.  Eyes:     Conjunctiva/sclera: Conjunctivae  normal.  Cardiovascular:     Rate and Rhythm: Normal rate and regular rhythm.     Pulses: Normal pulses.     Heart sounds: Normal heart sounds.  Pulmonary:     Effort: Pulmonary effort is normal.     Breath sounds: Normal breath sounds.  Abdominal:     General: Bowel sounds are normal.     Palpations: Abdomen is soft.     Comments: Has colostomy and Peg tube, intact  Musculoskeletal:        General: No swelling.     Cervical back: Normal range of motion.  Skin:    General: Skin is warm and dry.  Neurological:     General: No focal deficit present.     Mental Status: He is alert and oriented to person, place, and time.  Psychiatric:        Mood and Affect: Mood normal.        Behavior: Behavior normal.        Thought Content: Thought content normal.        Judgment: Judgment normal.       Labs reviewed: Recent Labs    10/18/20 0503 10/18/20 1551 10/19/20 0507 10/19/20 1459 10/19/20 1524 10/21/20 1536 10/23/20 1829 10/25/20 0440 11/04/20 0000 11/08/20 1018 11/22/20 1040  NA 137  --   --    < >  --    < > 132*  --  137 135 135  K 4.4  --   --    < >  --    < > 4.1  --  4.6 3.8 4.0  CL 107  --   --    < >  --    < > 99  --  100 99 99  CO2 23  --   --    < >  --    < > 27  --  24* 25 25  GLUCOSE 111*  --   --    < >  --    < > 145*  --   --  209* 131*  BUN 9  --   --    < >  --    < > 18  --  20 15 12   CREATININE 0.63  --   --    < >  --    < > 0.71   < > 0.7 0.94 0.90  CALCIUM 8.9  --   --    < >  --    < > 9.0  --  9.3 9.7 9.9  MG 1.8 2.2 2.2  --  1.9  --   --   --   --   --   --  PHOS 3.1 3.2 4.3  --   --   --   --   --   --   --   --    < > = values in this interval not displayed.   Recent Labs    10/16/20 0527 11/08/20 1018 11/22/20 1040  AST 13* 51* 33  ALT 10 74* 39  ALKPHOS 114 148* 131*  BILITOT 0.4 0.6 0.3  PROT 6.2* 8.0 8.5*  ALBUMIN 2.7* 3.3* 3.6   Recent Labs    10/24/20 1605 11/04/20 0000 11/08/20 1018 11/22/20 1040  WBC 4.4 4.9 5.6  4.0  NEUTROABS  --  2.40 3.1 2.6  HGB 9.5* 10.3* 11.3* 11.7*  HCT 29.5* 31* 35.8* 36.5*  MCV 80.2  --  79.0* 77.0*  PLT 273 220 217 248   No results found for: TSH Lab Results  Component Value Date   HGBA1C 6.5 (H) 10/02/2020   Lab Results  Component Value Date   CHOL 197 07/14/2009   HDL 55 07/14/2009   LDLCALC 130 (H) 07/14/2009   TRIG 62 07/14/2009   CHOLHDL 3.6 Ratio 07/14/2009    Significant Diagnostic Results in last 30 days:  No results found.  Assessment/Plan  1. Esophageal dysphagia -  continue mechanical soft with liquids and Osmolite 1.5 Cal supplementation  -   Continue PEG tube feedings  2. Chronic pain due to neoplasm -  Pain is controlled/stable, continue oxycodone and PRN acetaminophen  Followed by hospice/comfort care. Poor appetite -   Continue Remeron  4. 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  Labs/tests ordered:   None    Durenda Age, DNP, MSN, FNP-BC [facility] [phone] (Monday-Friday 8:00 a.m. - 5:00 p.m.) [phone] (after hours)

## 2021-03-31 ENCOUNTER — Other Ambulatory Visit (HOSPITAL_COMMUNITY): Payer: Self-pay | Admitting: General Surgery

## 2021-03-31 ENCOUNTER — Emergency Department (HOSPITAL_COMMUNITY)
Admission: EM | Admit: 2021-03-31 | Discharge: 2021-03-31 | Disposition: A | Discharge status: Skilled Nursing Facility | Attending: Emergency Medicine | Admitting: Emergency Medicine

## 2021-03-31 ENCOUNTER — Encounter: Payer: Self-pay | Admitting: Adult Health

## 2021-03-31 ENCOUNTER — Encounter (HOSPITAL_COMMUNITY): Payer: Self-pay | Admitting: Emergency Medicine

## 2021-03-31 ENCOUNTER — Non-Acute Institutional Stay (SKILLED_NURSING_FACILITY): Payer: Medicare Other | Admitting: Adult Health

## 2021-03-31 ENCOUNTER — Other Ambulatory Visit: Payer: Self-pay

## 2021-03-31 DIAGNOSIS — R630 Anorexia: Secondary | ICD-10-CM | POA: Diagnosis not present

## 2021-03-31 DIAGNOSIS — G893 Neoplasm related pain (acute) (chronic): Secondary | ICD-10-CM | POA: Diagnosis not present

## 2021-03-31 DIAGNOSIS — Z4659 Encounter for fitting and adjustment of other gastrointestinal appliance and device: Secondary | ICD-10-CM

## 2021-03-31 DIAGNOSIS — Z8501 Personal history of malignant neoplasm of esophagus: Secondary | ICD-10-CM | POA: Diagnosis not present

## 2021-03-31 DIAGNOSIS — Z85038 Personal history of other malignant neoplasm of large intestine: Secondary | ICD-10-CM | POA: Insufficient documentation

## 2021-03-31 DIAGNOSIS — K9423 Gastrostomy malfunction: Secondary | ICD-10-CM | POA: Insufficient documentation

## 2021-03-31 DIAGNOSIS — R1319 Other dysphagia: Secondary | ICD-10-CM

## 2021-03-31 DIAGNOSIS — C187 Malignant neoplasm of sigmoid colon: Secondary | ICD-10-CM

## 2021-03-31 NOTE — ED Notes (Signed)
Report called to [facility] 

## 2021-03-31 NOTE — Discharge Instructions (Signed)
The Foley catheter placed tonight may be used for tube feeds can be used using a [place] syringe. ? ?You need to follow-up with your general surgeon to see about having the feeding tube replaced. ?

## 2021-03-31 NOTE — ED Triage Notes (Addendum)
Patient from [facility], patient's feeding tube was found out per [facility].  Patient CAOx4. Patient has abdominal pain.  VSS.   ?

## 2021-03-31 NOTE — ED Provider Notes (Signed)
?[facility] ?Provider Note ? ? ?CSN: [id number] ?Arrival date & time: 03/31/21  0105 ? ?  ? ?History ? ?Chief Complaint  ?Patient presents with  ? Feeding Tube Replacement  ? ? ?Marcus [name] is a 76 y.o. male. ? ?Patient is a 76 year old male with extensive past medical history including esophageal cancer, colon cancer.  Patient currently on hospice.  He is sent to the ER from his extended care facility for evaluation of a dislodged feeding tube.  Patient has no other complaints.  The tube is apparently being used for feeding, but patient is able to eat and drink. ? ?The history is provided by the patient.  ? ?  ? ?Home Medications ?Prior to Admission medications   ?Medication Sig Start Date End Date Taking? Authorizing Provider  ?acetaminophen (TYLENOL) 160 MG/5ML solution Place 20.3 mLs (650 mg total) into feeding tube every 6 (six) hours as needed for mild pain. 10/28/20   Mariel Aloe, MD  ?Amino Acids-Protein Hydrolys (PRO-STAT MAX) LIQD Give 30 mLs by tube 2 (two) times daily. for supplement    [provider]  ?ferrous sulfate 220 (44 Fe) MG/5ML solution Place 300 mg into feeding tube daily.    [provider]  ?mirtazapine (REMERON) 15 MG tablet Place 15 mg into feeding tube at bedtime.    [provider]  ?Multiple Vitamin (MULTIVITAMIN) LIQD Place 10 mLs into feeding tube daily.    [provider]  ?Nutritional Supplements (ENSURE ENLIVE PO) Take 1 Can by mouth 3 (three) times daily.    [provider]  ?Nutritional Supplements (FEEDING SUPPLEMENT, OSMOLITE 1.5 CAL,) LIQD 1 CARTON 5 TIMES PER DAY WITH 50ML BEFORE AND AFTER EACH BOLUS.    [provider]  ?Nutritional Supplements (NUTRITIONAL SUPPLEMENT PO) Take by mouth in the morning and at bedtime. Magic Cup.    [provider]  ?ondansetron (ZOFRAN) 4 MG tablet Give 1 tab per peg tube every 8 hours for nausea/vomiting    [provider]   ?oxyCODONE HCl 10 MG/0.5ML CONC Take 5 mg by mouth every 4 (four) hours. Give 5MG=0.25 EVERY FOUR HOURS PO/PEG SCHEDULED PAIN MANGEMENT. 03/07/21   Medina-Vargas, Monina C, NP  ?oxyCODONE HCl 10 MG/0.5ML CONC Take 5 mg by mouth every 6 (six) hours as needed (for pain management). 03/07/21   Medina-Vargas, Monina C, NP  ?polyethylene glycol (MIRALAX / GLYCOLAX) 17 g packet Take 17 g by mouth daily.    [provider]  ?simethicone (MYLICON) 121 MG chewable tablet Place 125 mg into feeding tube 3 (three) times daily as needed for flatulence.    [provider]  ?Water For Irrigation, Sterile (FREE WATER) SOLN Place 100 mLs into feeding tube every 8 (eight) hours. 50 ml free water flush before and after each TF bolus given 10/28/20   Mariel Aloe, MD  ?   ? ?Allergies    ?Patient has no known allergies.   ? ?Review of Systems   ?Review of Systems  ?All other systems reviewed and are negative. ? ?Physical Exam ?Updated Vital Signs ?BP 113/78   Pulse 82   Temp 98.6 ?F (37 ?C) (Oral)   Resp 14   SpO2 98%  ?Physical Exam ?Vitals and nursing note reviewed.  ?Constitutional:   ?   General: He is not in acute distress. ?   Appearance: Normal appearance. He is not ill-appearing.  ?HENT:  ?   Head: Normocephalic and atraumatic.  ?Pulmonary:  ?  Effort: Pulmonary effort is normal.  ?Abdominal:  ?   General: Abdomen is flat. There is no distension.  ?   Tenderness: There is no abdominal tenderness.  ?   Comments: The ostomy site dressing is clean and intact.  The PEG tube site is noted just above.  ?Skin: ?   General: Skin is warm and dry.  ?Neurological:  ?   Mental Status: He is alert.  ? ? ?ED Results / Procedures / Treatments   ?Labs ?(all labs ordered are listed, but only abnormal results are displayed) ?Labs Reviewed - No data to display ? ?EKG ?None ? ?Radiology ?No results found. ? ?Procedures ?Procedures  ? ? ?Medications Ordered in ED ?Medications - No data to display ? ?ED Course/ Medical  Decision Making/ A&P ? ?I made an attempt to replace the feeding tube with 2 different sized feeding tubes, however was unsuccessful.  I met resistance and cause the patient pain.  I was able to place a small [place] rubber catheter successfully. ? ?Patient will be discharged to home with follow-up with his surgeon. ? ?Final Clinical Impression(s) / ED Diagnoses ?Final diagnoses:  ?None  ? ? ?Rx / DC Orders ?ED Discharge Orders   ? ? None  ? ?  ? ? ?  ?Veryl Speak, MD ?03/31/21 [phone] ? ?

## 2021-03-31 NOTE — Progress Notes (Signed)
? ?Location:  [facility] ?Nursing Home Room Number: 108 A ?Place of Service:  SNF (31) ?Provider:  Durenda Age, DNP, FNP-BC ? ?Patient Care Team: ?Hendricks Limes, MD as PCP - General (Internal Medicine) ? ?Extended Emergency Contact Information ?Primary Emergency Contact: Perry,Albert ?Home Phone: [phone] ?Mobile Phone: [phone] ?Relation: Relative ?Secondary Emergency Contact: Franey,Robert ?Home Phone: [phone] ?Mobile Phone: [phone] ?Relation: Brother ?Interpreter needed? No ? ?Code Status:  DNR ? ?Goals of care: Advanced Directive information ? ?  03/31/2021  ? 11:07 AM  ?Advanced Directives  ?Does Patient Have a Medical Advance Directive? Yes  ?Type of Paramedic of [place];Living will  ?Does patient want to make changes to medical advance directive? No - Patient declined  ?Copy of [place] in Chart? Yes - validated most recent copy scanned in chart (See row information)  ? ? ? ?Chief Complaint  ?Patient presents with  ? Acute Visit  ?  ED follow up  ? ? ?HPI:  ?Pt is a 76 y.o. male seen today for an acute visit, follow up ED visit. He is a long-term care resident of [facility]. He is followed by hospice care. He was sent to ED due to dislodged feeding tube.  There were two different tube sizes were attempted to be but was unsuccessful due to meeting resistance. A small [place] rubber catheter was placed. ? ?He was seen in his room today. Noted rubber catheter French 12 has been placed and now being used for tube feeding. He complains of abdominal pain, 9/10. He is currently taking oxycodone 5 mg sublingual every 4 hours and 5 mg every 6 hours as needed for pain.  He takes mirtazapine 15 mg 1 tab via PEG tube at bedtime for poor appetite.  Patient's weight is 109.2 lb, weight loss of 5.6 lbs in a month. ? ? ? ? ? ?Past Medical History:  ?Diagnosis Date  ? Arthritis   ? Depression   ? History of transfusion    ? AS CHILD  ? Hypertension   ? Numbness and tingling of right arm   ? 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 Prostate cancer ([place]) 09/04/13  ? Adenocarcinoma  ? ?Past Surgical History:  ?Procedure Laterality Date  ? BIOPSY  09/26/2020  ? Procedure: BIOPSY;  Surgeon: Ronnette Juniper, MD;  Location: [facility];  Service: Gastroenterology;;  ? BIOPSY  09/28/2020  ? Procedure: BIOPSY;  Surgeon: Ronnette Juniper, MD;  Location: [facility];  Service: Gastroenterology;;  ? COLON RESECTION N/A 09/30/2020  ? Procedure: HAND ASSISTED LAPAROSCOPIC HARTMAN'S PROCEDURE WITH END COLOSTOMY; TAP BLOCK, PLACEMENT OF PEG TUBE;  Surgeon: Greer Pickerel, MD;  [facility];  Service: General;  Laterality: N/A;  ? ESOPHAGOGASTRODUODENOSCOPY (EGD) WITH PROPOFOL N/A 09/26/2020  ? Procedure: ESOPHAGOGASTRODUODENOSCOPY (EGD) WITH PROPOFOL;  Surgeon: Ronnette Juniper, MD;  [facility];  Service: Gastroenterology;  Laterality: N/A;  ? FLEXIBLE SIGMOIDOSCOPY N/A 09/28/2020  ? Procedure: FLEXIBLE SIGMOIDOSCOPY;  Surgeon: Ronnette Juniper, MD;  Location: [facility];  Service: Gastroenterology;  Laterality: N/A;  ? IR REPLC GASTRO/COLONIC TUBE PERCUT W/FLUORO  04/04/2021  ? LAMINECTOMY N/A 09/18/2020  ? Procedure: THORACIC LAMINECTOMY FOR TUMOR;  Surgeon: Newman Pies, MD;  Location: [facility];  Service: Neurosurgery;  Laterality: N/A;  ? LYMPHADENECTOMY Bilateral 09/04/2013  ? Procedure: PELVIC LYMPH NODE DISSECTION;  Surgeon: Alexis Frock, MD;  [facility];  Service: Urology;  Laterality: Bilateral;  ? PORTACATH PLACEMENT N/A 10/11/2020  ?  Procedure: INSERTION PORT-A-CATH;  Surgeon: Armandina Gemma, MD;  [facility];  Service: General;  Laterality: N/A;  60 MINUTES ROOM 4  ? ROBOT ASSISTED LAPAROSCOPIC RADICAL PROSTATECTOMY N/A 09/04/2013  ? Procedure: ROBOTIC ASSISTED LAPAROSCOPIC RADICAL PROSTATECTOMY, INDOCYANINE GREEN DYE;  Surgeon: Alexis Frock, MD;  [facility];  Service: Urology;  Laterality: N/A;  ? SUBMUCOSAL TATTOO  INJECTION  09/28/2020  ? Procedure: SUBMUCOSAL TATTOO INJECTION;  Surgeon: Ronnette Juniper, MD;  [facility];  Service: Gastroenterology;;  ? TOTAL HIP ARTHROPLASTY Right 05/15/2016  ? Procedure: RIGHT TOTAL HIP ARTHROPLASTY ANTERIOR APPROACH;  Surgeon: Paralee Cancel, MD;  [facility];  Service: Orthopedics;  Laterality: Right;  ? WRIST GANGLION EXCISION    ? ? ?No Known Allergies ? ?Outpatient Encounter Medications as of 03/31/2021  ?Medication Sig  ? acetaminophen (TYLENOL) 160 MG/5ML solution Place 20.3 mLs (650 mg total) into feeding tube every 6 (six) hours as needed for mild pain. (Patient taking differently: Place 650 mg into feeding tube every 6 (six) hours as needed for mild pain. 19.5 ml)  ? Amino Acids-Protein Hydrolys (PRO-STAT MAX) LIQD Give 30 mLs by tube 2 (two) times daily. for supplement  ? ferrous sulfate 220 (44 Fe) MG/5ML solution Place 300 mg into feeding tube daily.  ? mirtazapine (REMERON) 15 MG tablet Place 15 mg into feeding tube at bedtime.  ? Multiple Vitamin (MULTIVITAMIN) LIQD Place 10 mLs into feeding tube daily.  ? NON FORMULARY Magic Cup twice daily  ? Nutritional Supplements (ENSURE ENLIVE PO) Take 1 Can by mouth 3 (three) times daily.  ? Nutritional Supplements (FEEDING SUPPLEMENT, OSMOLITE 1.5 CAL,) LIQD Continuously at 45 ml/hr via PEG, flushes of water  150 ml every 4 hours  ? ondansetron (ZOFRAN) 4 MG tablet Place 4 mg into feeding tube every 8 (eight) hours as needed for vomiting or nausea.  ? oxyCODONE HCl 10 MG/0.5ML CONC Take 5 mg by mouth every 6 (six) hours as needed (for pain management).  ? oxyCODONE HCl 10 MG/0.5ML CONC Take by mouth. Give '5mg'$ /0.89m SL q 4hrs pain  ? polyethylene glycol (MIRALAX / GLYCOLAX) 17 g packet Take 17 g by mouth every other day.  ? simethicone (MYLICON) 1778MG chewable tablet Place 125 mg into feeding tube 3 (three) times daily as needed for flatulence.  ? Water For Irrigation, Sterile (FREE WATER) SOLN Place 100 mLs into feeding tube  every 8 (eight) hours. 50 ml free water flush before and after each TF bolus given  ? [DISCONTINUED] oxyCODONE HCl 10 MG/0.5ML CONC Take 5 mg by mouth every 4 (four) hours. Give '5MG'$ =0.25 EVERY FOUR HOURS PO/PEG SCHEDULED PAIN MANGEMENT. (Patient taking differently: Give 5 mg by tube every 4 (four) hours.)  ? ?No facility-administered encounter medications on file as of 03/31/2021.  ? ? ?Review of Systems  ?Constitutional:  Negative for activity change, appetite change and fever.  ?HENT:  Negative for sore throat.   ?Eyes: Negative.   ?Cardiovascular:  Negative for chest pain and leg swelling.  ?Gastrointestinal:  Positive for abdominal pain. Negative for abdominal distention, diarrhea and vomiting.  ?Genitourinary:  Negative for dysuria, frequency and urgency.  ?Skin:  Negative for color change.  ?Neurological:  Negative for dizziness and headaches.  ?Psychiatric/Behavioral:  Negative for behavioral problems and sleep disturbance. The patient is not nervous/anxious.   ? ? ?Immunization History  ?Administered Date(s) Administered  ? Influenza-Unspecified 08/16/2019  ? PFIZER(Purple Top)SARS-COV-2 Vaccination 02/16/2019, 03/16/2019  ? PPension scheme manager124yr& up 10/15/2020  ?  Pneumococcal Conjugate-13 09/16/2019  ? Pneumococcal Polysaccharide-23 09/05/2013  ? Tdap 10/15/2017  ? Zoster Recombinat (Shingrix) 09/16/2019  ? ?Pertinent  Health Maintenance Due  ?Topic Date Due  ? COLONOSCOPY (Pts 45-7yr Insurance coverage will need to be confirmed)  Never done  ? INFLUENZA VACCINE  08/15/2020  ? ? ?  10/28/2020  ?  7:15 AM 10/28/2020  ?  9:38 PM 11/30/2020  ? 10:46 AM 11/30/2020  ? 11:14 AM 03/31/2021  ?  1:12 AM  ?Fall Risk  ?Patient Fall Risk Level Moderate fall risk Moderate fall risk High fall risk High fall risk High fall risk  ? ? ? ?Vitals:  ? 03/31/21 1104  ?BP: (!) 146/97  ?Pulse: 84  ?Resp: 18  ?Temp: (!) 97.4 ?F (36.3 ?C)  ?Weight: 109 lb 3.2 oz (49.5 kg)  ?Height: '5\' 10"'$  (1.778 m)   ? ?Body mass index is 15.67 kg/m?. ? ?Physical Exam ?Constitutional:   ?   Comments: Cachectic.  ?HENT:  ?   Head: Normocephalic and atraumatic.  ?   Mouth/Throat:  ?   Mouth: Mucous membranes are moist.  ?E

## 2021-03-31 NOTE — ED Notes (Signed)
20 french feeding tube replacement kit at bedside ?

## 2021-04-04 ENCOUNTER — Other Ambulatory Visit (HOSPITAL_COMMUNITY): Payer: Self-pay | Admitting: General Surgery

## 2021-04-04 ENCOUNTER — Ambulatory Visit (HOSPITAL_COMMUNITY)
Admission: RE | Admit: 2021-04-04 | Discharge: 2021-04-04 | Disposition: A | Discharge status: Home / Self Care | Source: Ambulatory Visit | Attending: General Surgery | Admitting: General Surgery

## 2021-04-04 ENCOUNTER — Other Ambulatory Visit: Payer: Self-pay

## 2021-04-04 DIAGNOSIS — C187 Malignant neoplasm of sigmoid colon: Secondary | ICD-10-CM | POA: Diagnosis not present

## 2021-04-04 DIAGNOSIS — Z431 Encounter for attention to gastrostomy: Secondary | ICD-10-CM | POA: Insufficient documentation

## 2021-04-04 HISTORY — PX: IR REPLC GASTRO/COLONIC TUBE PERCUT W/FLUORO: IMG2333

## 2021-04-04 MED ORDER — LIDOCAINE VISCOUS HCL 2 % MT SOLN
OROMUCOSAL | Status: AC
  Filled 2021-04-04: qty 15

## 2021-04-04 MED ORDER — IOHEXOL 300 MG/ML  SOLN
50.0000 mL | Freq: Once | INTRAMUSCULAR | Status: AC | PRN
  Administered 2021-04-04: 10 mL

## 2021-04-04 NOTE — Procedures (Signed)
Interventional Radiology Procedure Note ? ?Procedure: Rescue of displaced gastrostomy tube.  New 54F balloon retention placed. Marland Kitchen  ?Complications: None ?Recommendations:  ?- Ok to use  ? ?Signed, ? ?Dulcy Fanny. Earleen Newport, DO ? ? ?

## 2021-04-18 ENCOUNTER — Non-Acute Institutional Stay (SKILLED_NURSING_FACILITY): Payer: Medicare Other | Admitting: Adult Health

## 2021-04-18 ENCOUNTER — Encounter: Payer: Self-pay | Admitting: Adult Health

## 2021-04-18 ENCOUNTER — Other Ambulatory Visit: Payer: Self-pay

## 2021-04-18 ENCOUNTER — Emergency Department (HOSPITAL_COMMUNITY)
Admission: EM | Admit: 2021-04-18 | Discharge: 2021-04-19 | Disposition: A | Discharge status: Home / Self Care | Payer: Medicare Other | Attending: Emergency Medicine | Admitting: Emergency Medicine

## 2021-04-18 ENCOUNTER — Encounter (HOSPITAL_COMMUNITY): Payer: Self-pay | Admitting: Emergency Medicine

## 2021-04-18 ENCOUNTER — Emergency Department (HOSPITAL_COMMUNITY): Payer: Medicare Other

## 2021-04-18 DIAGNOSIS — R1319 Other dysphagia: Secondary | ICD-10-CM

## 2021-04-18 DIAGNOSIS — C154 Malignant neoplasm of middle third of esophagus: Secondary | ICD-10-CM

## 2021-04-18 DIAGNOSIS — S381XXA Crushing injury of abdomen, lower back, and pelvis, initial encounter: Secondary | ICD-10-CM | POA: Diagnosis present

## 2021-04-18 DIAGNOSIS — C187 Malignant neoplasm of sigmoid colon: Secondary | ICD-10-CM

## 2021-04-18 DIAGNOSIS — Z931 Gastrostomy status: Secondary | ICD-10-CM | POA: Insufficient documentation

## 2021-04-18 DIAGNOSIS — S300XXA Contusion of lower back and pelvis, initial encounter: Secondary | ICD-10-CM | POA: Insufficient documentation

## 2021-04-18 DIAGNOSIS — W010XXA Fall on same level from slipping, tripping and stumbling without subsequent striking against object, initial encounter: Secondary | ICD-10-CM | POA: Insufficient documentation

## 2021-04-18 DIAGNOSIS — F419 Anxiety disorder, unspecified: Secondary | ICD-10-CM | POA: Diagnosis not present

## 2021-04-18 DIAGNOSIS — W19XXXA Unspecified fall, initial encounter: Secondary | ICD-10-CM

## 2021-04-18 DIAGNOSIS — D500 Iron deficiency anemia secondary to blood loss (chronic): Secondary | ICD-10-CM | POA: Diagnosis not present

## 2021-04-18 DIAGNOSIS — Z7189 Other specified counseling: Secondary | ICD-10-CM

## 2021-04-18 DIAGNOSIS — C61 Malignant neoplasm of prostate: Secondary | ICD-10-CM

## 2021-04-18 DIAGNOSIS — G893 Neoplasm related pain (acute) (chronic): Secondary | ICD-10-CM

## 2021-04-18 DIAGNOSIS — R630 Anorexia: Secondary | ICD-10-CM

## 2021-04-18 MED ORDER — OXYCODONE HCL 10 MG/0.5ML PO CONC: 5.0000 mg | Freq: Four times a day (QID) | ORAL | 0 refills | Status: DC | PRN

## 2021-04-18 MED ORDER — OXYCODONE HCL 10 MG/0.5ML PO CONC: 5.0000 mg | ORAL | 0 refills | Status: DC

## 2021-04-18 MED ORDER — LORAZEPAM 0.5 MG PO TABS: 0.5000 mg | ORAL_TABLET | Freq: Two times a day (BID) | ORAL | 0 refills | Status: DC | PRN

## 2021-04-18 MED ORDER — IOHEXOL 300 MG/ML  SOLN
50.0000 mL | Freq: Once | INTRAMUSCULAR | Status: AC | PRN
  Administered 2021-04-18: 50 mL

## 2021-04-18 NOTE — ED Notes (Signed)
[facility] called for transport.  

## 2021-04-18 NOTE — Discharge Instructions (Signed)
You were seen in the emergency department for evaluation of injuries from a fall.  You had an x-ray of your pelvis that did not show any fracture.  You also had a tube study which confirmed that your G-tube is in good position.  Please continue your regular medications and follow-up with your primary care doctor.  Return if any worsening or concerning symptoms. ?

## 2021-04-18 NOTE — ED Triage Notes (Signed)
Pt in from [facility] via [facility], reports feeding tube was pulled out by pt this afternoon, followed by unwitnessed fall. Pt found down on R side per staff. Pt denies any LOC or thinners. Pt a&ox4, states it was a trip fall, and last TF was at 1400.  ?

## 2021-04-18 NOTE — ED Provider Notes (Signed)
?[facility] DEPT ?Provider Note ? ? ?CSN: [id number] ?Arrival date & time: 04/18/21  2032 ? ?  ? ?History ? ?Chief Complaint  ?Patient presents with  ? Fall  ? ? ?Marcus [name] is a 76 y.o. male.  Sent in from [facility] after what sounds like a mechanical fall outside of his room.  He said he slid down the wall and landed on his butt.  There is also some concern that his G-tube became dislodged.  He denies any other complaints.  He denies loss of consciousness and states he is ambulatory after the fall. ? ?The history is provided by the patient and the EMS personnel.  ?Fall ?This is a new problem. The current episode started 1 to 2 hours ago. The problem has been resolved. Pertinent negatives include no chest pain, no abdominal pain, no headaches and no shortness of breath. Nothing aggravates the symptoms. Nothing relieves the symptoms. He has tried rest for the symptoms. The treatment provided moderate relief.  ? ?  ? ?Home Medications ?Prior to Admission medications   ?Medication Sig Start Date End Date Taking? Authorizing Provider  ?acetaminophen (TYLENOL) 160 MG/5ML solution Place 20.3 mLs (650 mg total) into feeding tube every 6 (six) hours as needed for mild pain. ?Patient taking differently: Place 650 mg into feeding tube every 6 (six) hours as needed for mild pain. 19.5 ml 10/28/20   Mariel Aloe, MD  ?Amino Acids-Protein Hydrolys (PRO-STAT MAX) LIQD Give 30 mLs by tube 2 (two) times daily. for supplement    [provider]  ?ferrous sulfate 220 (44 Fe) MG/5ML solution Place 300 mg into feeding tube daily.    [provider]  ?LORazepam (ATIVAN) 0.5 MG tablet Take 1 tablet (0.5 mg total) by mouth 2 (two) times daily as needed for anxiety. 04/18/21 04/18/22  Medina-Vargas, Monina C, NP  ?mirtazapine (REMERON) 15 MG tablet Place 15 mg into feeding tube at bedtime.    [provider]  ?Multiple Vitamin (MULTIVITAMIN) LIQD Place 10 mLs into feeding tube  daily.    [provider]  ?NON FORMULARY Magic Cup twice daily    [provider]  ?Nutritional Supplements (ENSURE ENLIVE PO) Take 1 Can by mouth 3 (three) times daily.    [provider]  ?Nutritional Supplements (FEEDING SUPPLEMENT, OSMOLITE 1.5 CAL,) LIQD Continuously at 45 ml/hr via PEG, flushes of water  150 ml every 4 hours    [provider]  ?ondansetron (ZOFRAN) 4 MG tablet Place 4 mg into feeding tube every 8 (eight) hours as needed for vomiting or nausea.    [provider]  ?oxyCODONE HCl 10 MG/0.5ML CONC Take 5 mg by mouth every 6 (six) hours as needed (for pain management). 04/18/21   Medina-Vargas, Monina C, NP  ?oxyCODONE HCl 10 MG/0.5ML CONC Take 5 mg by mouth every 4 (four) hours. Give '5mg'$ /0.3m SL q 4hrs pain 04/18/21   Medina-Vargas, Monina C, NP  ?polyethylene glycol (MIRALAX / GLYCOLAX) 17 g packet Take 17 g by mouth every other day.    [provider]  ?simethicone (MYLICON) 1903MG chewable tablet Place 125 mg into feeding tube 3 (three) times daily as needed for flatulence.    [provider]  ?Water For Irrigation, Sterile (FREE WATER) SOLN Place 100 mLs into feeding tube every 8 (eight) hours. 50 ml free water flush before and after each TF bolus given 10/28/20   NMariel Aloe MD  ?   ? ?Allergies    ?  Patient has no known allergies.   ? ?Review of Systems   ?Review of Systems  ?Constitutional:  Negative for fever.  ?Respiratory:  Negative for shortness of breath.   ?Cardiovascular:  Negative for chest pain.  ?Gastrointestinal:  Negative for abdominal pain and vomiting.  ?Musculoskeletal:  Negative for back pain and neck pain.  ?Neurological:  Negative for headaches.  ? ?Physical Exam ?Updated Vital Signs ?BP (!) 146/73   Pulse 83   Temp 97.9 ?F (36.6 ?C) (Oral)   Resp 18   Wt 49.6 kg   SpO2 97%   BMI 15.67 kg/m?  ?Physical Exam ?Vitals and nursing note reviewed.  ?Constitutional:   ?   General: He is not in acute  distress. ?   Appearance: Normal appearance. He is well-developed.  ?HENT:  ?   Head: Normocephalic and atraumatic.  ?Eyes:  ?   Conjunctiva/sclera: Conjunctivae normal.  ?Cardiovascular:  ?   Rate and Rhythm: Normal rate and regular rhythm.  ?   Heart sounds: No murmur heard. ?Pulmonary:  ?   Effort: Pulmonary effort is normal. No respiratory distress.  ?   Breath sounds: Normal breath sounds.  ?   Comments: Patient is a Foley catheter in place as a G-tube.  Does not appear to be malposition.  Will check tube study. ?Abdominal:  ?   Palpations: Abdomen is soft.  ?   Tenderness: There is no abdominal tenderness. There is no guarding or rebound.  ?Musculoskeletal:     ?   General: No swelling, tenderness or deformity. Normal range of motion.  ?   Cervical back: Neck supple.  ?Skin: ?   General: Skin is warm and dry.  ?   Capillary Refill: Capillary refill takes less than 2 seconds.  ?Neurological:  ?   General: No focal deficit present.  ?   Mental Status: He is alert.  ?   Sensory: No sensory deficit.  ?   Motor: No weakness.  ? ? ?ED Results / Procedures / Treatments   ?Labs ?(all labs ordered are listed, but only abnormal results are displayed) ?Labs Reviewed - No data to display ? ?EKG ?None ? ?Radiology ?DG Pelvis 1-2 Views ? 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 No acute bony abnormality. Specifically, no fracture, subluxation, or dislocation. IMPRESSION: No acute bony abnormality. Electronically Signed   By: Rolm Baptise M.D.   On: 04/18/2021 21:53  ? ?DG ABDOMEN PEG TUBE LOCATION ? ?Result Date: 04/18/2021 ?CLINICAL DATA:  PEG tube placement EXAM: ABDOMEN - 1 VIEW COMPARISON:  None. FINDINGS: Peg tube tip is in the mid stomach. Contrast injected is noted in the stomach and  passing into the proximal duodenum. IMPRESSION: Peg tube tip in the mid stomach. Electronically Signed   By: Rolm Baptise M.D.   On: 04/18/2021 21:54   ? ?Procedures ?Procedures  ? ? ?Medications Ordered in ED ?Medications - No data to display ? ?ED Course/ Medical Decision Making/ A&P ?Clinical Course as of 04/19/21 0845  ?Tue Apr 18, 2021  ?2151 Patient's pelvic x-ray does not show any acute fracture.  His tube study also looks like it is intraluminal.  Awaiting radiology reading. [MB]  ?  ?Clinical Course User Index ?[MB] Hayden Rasmussen, MD  ? ?                        ?  Medical Decision Making ?Amount and/or Complexity of Data Reviewed ?Radiology: ordered. ? ?Risk ?Prescription drug management. ? ?This patient complains of fall, question G-tube dislodgment; this involves an extensive number of treatment ?Options and is a complaint that carries with it a high risk of complications and ?morbidity. The differential includes fall, fracture, dislocation, contusion, malpositioned G-tube ?I ordered imaging studies which included pelvis x-ray, tube study and I independently ?   visualized and interpreted imaging which showed no acute fracture, G-tube in appropriate position ?Additional history obtained from EMS ?Previous records obtained and reviewed in epic, recently seen for G-tube dysfunction ? ?Social determinants considered, no significant barriers ?Critical Interventions: None ? ?After the interventions stated above, I reevaluated the patient and found patient asymptomatic and declines any complaints ?Admission and further testing considered, no indications for admission or further work-up at this time.  Awaiting transport back to his facility.  Return instructions discussed ? ? ? ? ? ? ? ? ? ?Final Clinical Impression(s) / ED Diagnoses ?Final diagnoses:  ?Fall, initial encounter  ?Contusion of buttock, initial encounter  ?Gastrointestinal tube present ([place])  ? ? ?Rx / DC Orders ?ED Discharge Orders   ? ? None   ? ?  ? ? ?  ?Hayden Rasmussen, MD ?04/19/21 [phone] ? ?

## 2021-04-18 NOTE — Progress Notes (Addendum)
Location:  [facility] Room Number: 108A Place of Service:  SNF (31) Provider:  Kenard Gower, DNP, FNP-BC  Patient Care Team: Pecola Lawless, MD as PCP - General (Internal Medicine)  Extended Emergency Contact Information Primary Emergency Contact: Perry,Albert Address: [address]          [place], [place] [address] [place] of [place] Home Phone: [phone] Mobile Phone: [phone] Relation: Relative Secondary Emergency Contact: Kady,Robert Home Phone: [phone] Mobile Phone: [phone] Relation: Brother Interpreter needed? No  Code Status:  DNR  Goals of care: Advanced Directive information    04/18/2021    8:48 PM  Advanced Directives  Does Patient Have a Medical Advance Directive? Yes  Type of Advance Directive Healthcare Power of Attorney  Does patient want to make changes to medical advance directive? No - Patient declined  Copy of Healthcare Power of Attorney in Chart? Yes - validated most recent copy scanned in chart (See row information)  Pre-existing out of facility DNR order (yellow form or pink MOST form) Yellow form placed in chart (order not valid for inpatient use)     Chief Complaint  Patient presents with   Acute Visit    Care plan meeting    HPI:  Pt is a 76 y.o. male who had a care plan meeting attended by resident, brother-in-law, NP, social worker, NP student, hospice nurse, hospice social worker and hospice chaplain, who attended via teleconference. He remains to be DNR. Discussed medications, vital signs and weights. It was reported that he doesn't participate much in restorative therapy. He refused restorative exercises last week. He does not want to get up most of the time. He prefers to  to do activities in his room. He complains of abdominal pain. He takes oxycodone 5 mg SL every 4 hours routinely and every 6 hours PRN.  Thank you  recently accidentally pulled out by patient and was replaced by a foley  catheter. Discussed about starting patient on Ativan PRN for anxiety. Goal is toto keep him comfortable. His spiritual belief is intact. The meeting lasted for 30 minutes.   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    Prostate cancer (HCC) 09/04/13   Adenocarcinoma   Past Surgical History:  Procedure Laterality Date   BIOPSY  09/26/2020   Procedure: BIOPSY;  Surgeon: Kerin Salen, MD;  Location: [facility];  Service: Gastroenterology;;   BIOPSY  09/28/2020   Procedure: BIOPSY;  Surgeon: Kerin Salen, MD;  Location: [facility];  Service: Gastroenterology;;   COLON RESECTION N/A 09/30/2020   Procedure: HAND ASSISTED LAPAROSCOPIC HARTMAN'S PROCEDURE WITH END COLOSTOMY; TAP BLOCK, PLACEMENT OF PEG TUBE;  Surgeon: Gaynelle Adu, MD;  Location: [facility];  Service: General;  Laterality: N/A;   ESOPHAGOGASTRODUODENOSCOPY (EGD) WITH PROPOFOL N/A 09/26/2020   Procedure: ESOPHAGOGASTRODUODENOSCOPY (EGD) WITH PROPOFOL;  Surgeon: Kerin Salen, MD;  Location: [facility];  Service: Gastroenterology;  Laterality: N/A;   FLEXIBLE SIGMOIDOSCOPY N/A 09/28/2020   Procedure: FLEXIBLE SIGMOIDOSCOPY;  Surgeon: Kerin Salen, MD;  Location: [facility];  Service: Gastroenterology;  Laterality: N/A;   IR REPLC GASTRO/COLONIC TUBE PERCUT W/FLUORO  04/04/2021   IR REPLC GASTRO/COLONIC TUBE PERCUT W/FLUORO  04/21/2021   LAMINECTOMY N/A 09/18/2020   Procedure: THORACIC LAMINECTOMY FOR TUMOR;  Surgeon:  Tressie Stalker, MD;  [facility];  Service: Neurosurgery;  Laterality: N/A;   LYMPHADENECTOMY Bilateral 09/04/2013   Procedure: PELVIC LYMPH NODE DISSECTION;  Surgeon: Sebastian Ache, MD;  Location: [facility];  Service: Urology;  Laterality: Bilateral;   PORTACATH PLACEMENT N/A 10/11/2020   Procedure: INSERTION PORT-A-CATH;  Surgeon: Darnell Level, MD;   Location: [facility];  Service: General;  Laterality: N/A;  60 MINUTES ROOM 4   ROBOT ASSISTED LAPAROSCOPIC RADICAL PROSTATECTOMY N/A 09/04/2013   Procedure: ROBOTIC ASSISTED LAPAROSCOPIC RADICAL PROSTATECTOMY, INDOCYANINE GREEN DYE;  Surgeon: Sebastian Ache, MD;  Location: [facility];  Service: Urology;  Laterality: N/A;   SUBMUCOSAL TATTOO INJECTION  09/28/2020   Procedure: SUBMUCOSAL TATTOO INJECTION;  Surgeon: Kerin Salen, MD;  Location: [facility];  Service: Gastroenterology;;   TOTAL HIP ARTHROPLASTY Right 05/15/2016   Procedure: RIGHT TOTAL HIP ARTHROPLASTY ANTERIOR APPROACH;  Surgeon: Durene Romans, MD;  Location: [facility];  Service: Orthopedics;  Laterality: Right;   WRIST GANGLION EXCISION      No Known Allergies  Outpatient Encounter Medications as of 04/18/2021  Medication Sig   acetaminophen (TYLENOL) 160 MG/5ML solution Place 20.3 mLs (650 mg total) into feeding tube every 6 (six) hours as needed for mild pain. (Patient not taking: Reported on 04/27/2021)   Amino Acids-Protein Hydrolys (PRO-STAT MAX) LIQD Give 30 mLs by tube 2 (two) times daily. for supplement   ferrous sulfate 220 (44 Fe) MG/5ML solution Place 300 mg into feeding tube every morning.   LORazepam (ATIVAN) 0.5 MG tablet Take 1 tablet (0.5 mg total) by mouth 2 (two) times daily as needed for anxiety. (Patient taking differently: Place 0.5 mg under the tongue 2 (two) times daily as needed for anxiety.)   mirtazapine (REMERON) 15 MG tablet Place 15 mg into feeding tube at bedtime.   Multiple Vitamin (MULTIVITAMIN) LIQD Place 10 mLs into feeding tube every morning.   Nutritional Supplements (ENSURE ENLIVE PO) Place 237 mLs into feeding tube 3 (three) times daily.   ondansetron (ZOFRAN) 4 MG tablet Place 4 mg into feeding tube every 8 (eight) hours as needed for vomiting or nausea.   polyethylene glycol (MIRALAX / GLYCOLAX) 17 g packet Place 17 g into feeding tube every other day. Mix with 4-8 oz water   simethicone (MYLICON) 125 MG  chewable tablet Place 125 mg into feeding tube 3 (three) times daily as needed (gas/pain).   Water For Irrigation, Sterile (FREE WATER) SOLN Place 100 mLs into feeding tube every 8 (eight) hours. 50 ml free water flush before and after each TF bolus given (Patient taking differently: Place 150-200 mLs into feeding tube See admin instructions. Flush peg tube with 150 mls water every 4 hours. Also give 200 ml water flushes three times daily)   [DISCONTINUED] acetaminophen (TYLENOL) 325 MG tablet Take 650 mg by mouth every 6 (six) hours as needed.   [DISCONTINUED] levETIRAcetam (KEPPRA) 500 MG tablet Take 500 mg by mouth 2 (two) times daily.   [DISCONTINUED] NON FORMULARY Magic Cup twice daily   [DISCONTINUED] Nutritional Supplements (FEEDING SUPPLEMENT, OSMOLITE 1.5 CAL,) LIQD Continuously at 45 ml/hr via PEG, flushes of water  150 ml every 4 hours   [DISCONTINUED] oxyCODONE HCl 10 MG/0.5ML CONC Take 5 mg by mouth every 6 (six) hours as needed (for pain management).   [DISCONTINUED] oxyCODONE HCl 10 MG/0.5ML CONC Take by mouth. Give 5mg /0.27ml SL q 4hrs pain   oxyCODONE HCl 10 MG/0.5ML CONC Take 5 mg by mouth every 6 (six) hours as needed (for pain  management). (Patient taking differently: Place 5 mg into feeding tube See admin instructions. Take 0.25 ml (5 mg) sublingually every 4 hours, may also take 0.25 mls sublingually every 6 hours as needed for pain management)   [DISCONTINUED] oxyCODONE HCl 10 MG/0.5ML CONC Take 5 mg by mouth every 4 (four) hours. Give 5mg /0.23ml SL q 4hrs pain (Patient taking differently: Place 5 mg into feeding tube every 4 (four) hours. Give 5mg /0.59ml SL q 4hrs pain)   No facility-administered encounter medications on file as of 04/18/2021.    Review of Systems  Constitutional:  Negative for activity change, appetite change and fever.  HENT:  Negative for sore throat.   Eyes: Negative.   Cardiovascular:  Negative for chest pain and leg swelling.  Gastrointestinal:   Negative for abdominal distention, diarrhea and vomiting.  Genitourinary:  Negative for dysuria, frequency and urgency.  Skin:  Negative for color change.  Neurological:  Negative for dizziness and headaches.  Psychiatric/Behavioral:  Negative for behavioral problems and sleep disturbance. The patient is not nervous/anxious.       Immunization History  Administered Date(s) Administered   Influenza-Unspecified 08/16/2019   PFIZER(Purple Top)SARS-COV-2 Vaccination 02/16/2019, 03/16/2019   Pfizer Covid-19 Vaccine Bivalent Booster 32yrs & up 10/15/2020   Pneumococcal Conjugate-13 09/16/2019   Pneumococcal Polysaccharide-23 09/05/2013   Tdap 10/15/2017   Zoster Recombinat (Shingrix) 09/16/2019   Pertinent  Health Maintenance Due  Topic Date Due   COLONOSCOPY (Pts 45-50yrs Insurance coverage will need to be confirmed)  Never done   INFLUENZA VACCINE  08/15/2021      11/30/2020   11:14 AM 03/31/2021    1:12 AM 04/18/2021    8:50 PM 04/27/2021    7:37 AM 04/27/2021    7:54 PM  Fall Risk  Patient Fall Risk Level High fall risk High fall risk High fall risk High fall risk High fall risk     Vitals:   04/18/21 1131  BP: 107/72  Pulse: 91  Resp: 20  Temp: 97.9 F (36.6 C)  Weight: 109 lb 3.2 oz (49.5 kg)  Height: 5\' 10"  (1.778 m)   Body mass index is 15.67 kg/m.  Physical Exam Constitutional:      Appearance: Normal appearance.  HENT:     Head: Normocephalic and atraumatic.     Mouth/Throat:     Mouth: Mucous membranes are moist.  Eyes:     Conjunctiva/sclera: Conjunctivae normal.  Cardiovascular:     Rate and Rhythm: Normal rate and regular rhythm.     Pulses: Normal pulses.     Heart sounds: Normal heart sounds.  Pulmonary:     Effort: Pulmonary effort is normal.     Breath sounds: Normal breath sounds.  Abdominal:     General: Bowel sounds are normal.     Palpations: Abdomen is soft.     Comments: Has peg tube  Musculoskeletal:        General: No swelling.  Normal range of motion.  Skin:    General: Skin is warm and dry.  Neurological:     General: No focal deficit present.     Mental Status: He is alert and oriented to person, place, and time.  Psychiatric:        Mood and Affect: Mood normal.        Behavior: Behavior normal.        Thought Content: Thought content normal.        Judgment: Judgment normal.       Labs reviewed: Recent Labs  10/18/20 0503 10/18/20 1551 10/19/20 0507 10/19/20 1459 10/19/20 1524 10/21/20 1536 11/08/20 1018 11/22/20 1040 04/27/21 2113  NA 137  --   --    < >  --    < > 135 135 135  K 4.4  --   --    < >  --    < > 3.8 4.0 3.5  CL 107  --   --    < >  --    < > 99 99 101  CO2 23  --   --    < >  --    < > 25 25 22   GLUCOSE 111*  --   --    < >  --    < > 209* 131* 107*  BUN 9  --   --    < >  --    < > 15 12 11   CREATININE 0.63  --   --    < >  --    < > 0.94 0.90 0.68  CALCIUM 8.9  --   --    < >  --    < > 9.7 9.9 9.4  MG 1.8 2.2 2.2  --  1.9  --   --   --   --   PHOS 3.1 3.2 4.3  --   --   --   --   --   --    < > = values in this interval not displayed.   Recent Labs    11/08/20 1018 11/22/20 1040 04/27/21 2113  AST 51* 33 125*  ALT 74* 39 67*  ALKPHOS 148* 131* 225*  BILITOT 0.6 0.3 2.2*  PROT 8.0 8.5* 7.3  ALBUMIN 3.3* 3.6 2.9*   Recent Labs    11/08/20 1018 11/22/20 1040 04/27/21 2113  WBC 5.6 4.0 8.9  NEUTROABS 3.1 2.6 7.5  HGB 11.3* 11.7* 9.7*  HCT 35.8* 36.5* 30.3*  MCV 79.0* 77.0* 82.3  PLT 217 248 259   No results found for: TSH Lab Results  Component Value Date   HGBA1C 6.5 (H) 10/02/2020   Lab Results  Component Value Date   CHOL 197 07/14/2009   HDL 55 07/14/2009   LDLCALC 130 (H) 07/14/2009   TRIG 62 07/14/2009   CHOLHDL 3.6 Ratio 07/14/2009    Significant Diagnostic Results in last 30 days:  DG Pelvis 1-2 Views  Result Date: 04/18/2021 CLINICAL DATA:  Fall, pain EXAM: PELVIS - 1-2 VIEW COMPARISON:  10/02/2020 FINDINGS: Prior right hip  replacement. 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 No acute bony abnormality. Specifically, no fracture, subluxation, or dislocation. IMPRESSION: No acute bony abnormality. Electronically Signed   By: Charlett Nose M.D.   On: 04/18/2021 21:53   DG Abdomen 1 View  Result Date: 04/27/2021 CLINICAL DATA:  Possible displaced PEG tube. EXAM: ABDOMEN - 1 VIEW COMPARISON:  04/18/2021. FINDINGS: There are borderline distended loops of small bowel in the abdomen measuring up to 3.0 cm in diameter on image 1. A PEG tube terminates in the gastric body. Following administration of Gastrografin, contrast into the PEG tube there is normal opacification at the gastric fundus. No definite evidence of contrast extravasation. Vascular calcifications are present in the pelvis. Right hip arthroplasty changes are noted. IMPRESSION: 1. PEG tube terminating in the gastric body with normal opacification of the gastric fundus. 2. Mildly distended loops of small bowel in the  mid abdomen. Correlate clinically to exclude partial or early small-bowel obstruction. Electronically Signed   By: Thornell Sartorius M.D.   On: 04/27/2021 20:48   CT ABDOMEN PELVIS W CONTRAST  Result Date: 04/27/2021 CLINICAL DATA:  PEG tube coming out again, bowel obstruction suspected. EXAM: CT ABDOMEN AND PELVIS WITH CONTRAST TECHNIQUE: Multidetector CT imaging of the abdomen and pelvis was performed using the standard protocol following bolus administration of intravenous contrast. RADIATION DOSE REDUCTION: This exam was performed according to the departmental dose-optimization program which includes automated exposure control, adjustment of the mA and/or kV according to patient size and/or use of iterative reconstruction technique. CONTRAST:  80mL OMNIPAQUE IOHEXOL 300 MG/ML  SOLN COMPARISON:  10/07/2020. FINDINGS: Lower chest: The distal tip of central venous  catheter terminates in the right atrium. Mild dependent atelectasis is noted bilaterally. There is a trace pleural effusion on the left. Hepatobiliary: The liver is enlarged and hypodense masses are noted in both lobes measuring up to 9 cm. There is mild central intrahepatic biliary ductal dilatation. The common bile duct measures 9 mm. The gallbladder is without stones. Pancreas: An ill-defined mass is present in the upper abdomen exerting mass effect on the pancreas and may be pancreatic in nature. There is dilatation of the distal common bile duct measuring 4 mm. No acute inflammatory changes are identified. Spleen: Normal in size without focal abnormality. Adrenals/Urinary Tract: Mild thickening of the left adrenal gland without evidence of discrete nodule. No renal calculus or hydronephrosis. Stable hypodensities are present in the left kidney, the largest cystic in attenuation. No hydronephrosis. The bladder is unremarkable. Stomach/Bowel: A PEG tube is present in the stomach. No bowel obstruction, free air, or pneumatosis. There are partial colectomy changes with a left lower quadrant colostomy. A few scattered diverticula are present along the colon without evidence of diverticulitis. There is mild colonic wall thickening involving the ascending and transverse colon. Vascular/Lymphatic: Aortic atherosclerosis. There is atherosclerotic calcification with mural thrombus with high-grade stenosis in the common iliac artery on the left which is unchanged from the prior exam. Reproductive: The prostate gland is surgically absent by history. Other: There is a ill-defined hypodense mass in the upper abdomen in the region of the celiac axis and pancreatic head and body measuring 7.3 x 4.1 cm with encasement of the celiac, hepatic, splenic, superior mesenteric, and left renal arteries with significant narrowing due to extrinsic compression. There is segmental narrowing of the portal vein and superior mesenteric vein.  The splenic vein is not well seen on exam. The mass encases the the proximal aorta and low-density nodules are seen in the mid to distal aorta, suggesting lymphadenopathy. Free fluid is noted in the right upper quadrant, right pericolic gutter, and pelvis. Musculoskeletal: Total hip arthroplasty changes are noted on the right. Spinal fusion hardware is noted in the distal thoracic spine. There is trabeculation involving the pelvic bones on the left which is unchanged from the prior exam, likely Paget's disease. A few cortical erosions are noted along the posterior aspect of the iliac bone on the right. No acute osseous abnormality is seen. IMPRESSION: 1. Large hypodense mass in the upper abdomen in the region of the celiac axis and proximal pancreas with encasement and narrowing of the celiac, SMA, hepatic, splenic, and left renal arteries. There is extension of the mass in the retroperitoneum with retroperitoneal lymphadenopathy, suspicious for metastatic disease. 2. Marked interval worsening of hepatic metastasis. 3. Status post partial colectomy with left lower quadrant colostomy. There is mild  thickening of the walls of the ascending and transverse colon, possible infectious or inflammatory colitis. 4. Bony erosions along the posterior aspect of the iliac bone on the right which are new from the previous exam, worrisome for metastatic disease. 5. Remaining findings as described above. Electronically Signed   By: Thornell Sartorius M.D.   On: 04/27/2021 23:59   IR Replc Gastro/Colonic Tube Percut W/Fluoro  Result Date: 04/21/2021 INDICATION: 76 year old male with chronic indwelling percutaneous gastrostomy tube. His gastrostomy tube was recently displaced. He currently has a Foley catheter in place to maintain the percutaneous tract. EXAM: GASTROSTOMY CATHETER REPLACEMENT MEDICATIONS: None ANESTHESIA/SEDATION: None CONTRAST:  10mL OMNIPAQUE IOHEXOL 300 MG/ML SOLN - administered into the gastric lumen. FLUOROSCOPY  TIME:  Radiation exposure index: 0.4 mGy reference air kerma COMPLICATIONS: None immediate. PROCEDURE: Informed written consent was obtained from the patient after a thorough discussion of the procedural risks, benefits and alternatives. All questions were addressed. Maximal Sterile Barrier Technique was utilized including caps, mask, sterile gowns, sterile gloves, sterile drape, hand hygiene and skin antiseptic. A timeout was performed prior to the initiation of the procedure. The retention balloon of the Foley catheter was deflated and the catheter was removed. A new 16 French percutaneous gastrostomy tube was lubricated and advanced through the stoma and into the stomach. The retention balloon was inflated and pulled against the anterior abdominal wall. The external bumper was fixed in place. A small amount of contrast material was injected through the tube confirming intragastric location. IMPRESSION: Successful exchange for a new 50 French percutaneous gastrostomy tube. Electronically Signed   By: Malachy Moan M.D.   On: 04/21/2021 15:02   IR Replc Gastro/Colonic Tube Percut W/Fluoro  Result Date: 04/04/2021 INDICATION: 76 year old male with prior percutaneous gastrostomy, displaced EXAM: GASTROSTOMY CATHETER REPLACEMENT MEDICATIONS: None ANESTHESIA/SEDATION: None CONTRAST:  10mL OMNIPAQUE IOHEXOL 300 MG/ML SOLN - administered into the gastric lumen. FLUOROSCOPY: Radiation Exposure Index (as provided by the fluoroscopic device): 1 mGy Kerma COMPLICATIONS: None PROCEDURE: Informed written consent was obtained from the patient and the patient's family after a thorough discussion of the procedural risks, benefits and alternatives. All questions were addressed. Maximal Sterile Barrier Technique was utilized including caps, mask, sterile gowns, sterile gloves, sterile drape, hand hygiene and skin antiseptic. A timeout was performed prior to the initiation of the procedure. The indwelling Foley catheter  through the gastrostomy was deflated and removed. A new 16 French balloon retention gastrostomy was placed into the stomach lumen. Balloon was inflated with saline and withdrawn. The bumper was tightened. Contrast was injected with final image stored confirming location within the stomach. Patient tolerated the procedure well and remained hemodynamically stable throughout. No complications were encountered and no significant blood loss encountered. IMPRESSION: Rescue of displaced percutaneous gastrostomy with placement of a new 16 French balloon retention gastrostomy. Signed, Yvone Neu. Loreta Ave, DO [facility] Electronically Signed   By: Gilmer Mor D.O.   On: 04/04/2021 14:19   DG ABDOMEN PEG TUBE LOCATION  Result Date: 04/18/2021 CLINICAL DATA:  PEG tube placement EXAM: ABDOMEN - 1 VIEW COMPARISON:  None. FINDINGS: Peg tube tip is in the mid stomach. Contrast injected is noted in the stomach and passing into the proximal duodenum. IMPRESSION: Peg tube tip in the mid stomach. Electronically Signed   By: Charlett Nose M.D.   On: 04/18/2021 21:54    Assessment/Plan  1. ACP (advance care planning) -  remains to be DNR -   discussed medications, vital signs  and weights  2. Anxiety - LORazepam (ATIVAN) 0.5 MG tablet; Take 1 tablet (0.5 mg total) by mouth 2 (two) times daily as needed for anxiety. (Patient taking differently: Place 0.5 mg under the tongue 2 (two) times daily as needed for anxiety.)  Dispense: 30 tablet; Refill: 0  3. Anemia due to chronic blood loss Lab Results  Component Value Date   HGB 9.7 (L) 04/27/2021   -  continue FeSO4  4. Esophageal dysphagia -  continue peg tube feeding -   aspiration precaution  5.  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 Chronic pain due to neoplasm -  continue  Oxycodone     Family/ staff Communication: Discussed plan of care with resident, brother-in-law and IDT.  Labs/tests ordered:   abdominal x-ray to check peg tube placement, to hold feeding until placement verified.    Kenard Gower, DNP, MSN, FNP-BC [facility] [phone] (Monday-Friday 8:00 a.m. - 5:00 p.m.) [phone] (after hours)

## 2021-04-19 NOTE — ED Notes (Signed)
Patient verbalizes understanding of discharge instructions. Opportunity for questioning and answers were provided. Armband removed by staff, pt discharged from ED. Pt left with PTAR ? ?

## 2021-04-20 ENCOUNTER — Other Ambulatory Visit (HOSPITAL_COMMUNITY): Payer: Self-pay | Admitting: Internal Medicine

## 2021-04-20 DIAGNOSIS — R633 Feeding difficulties, unspecified: Secondary | ICD-10-CM

## 2021-04-21 ENCOUNTER — Ambulatory Visit (HOSPITAL_COMMUNITY)
Admission: RE | Admit: 2021-04-21 | Discharge: 2021-04-21 | Disposition: A | Discharge status: Home / Self Care | Source: Ambulatory Visit | Attending: Internal Medicine | Admitting: Internal Medicine

## 2021-04-21 DIAGNOSIS — Z431 Encounter for attention to gastrostomy: Secondary | ICD-10-CM | POA: Diagnosis present

## 2021-04-21 DIAGNOSIS — R633 Feeding difficulties, unspecified: Secondary | ICD-10-CM | POA: Diagnosis not present

## 2021-04-21 HISTORY — PX: IR REPLC GASTRO/COLONIC TUBE PERCUT W/FLUORO: IMG2333

## 2021-04-21 MED ORDER — IOHEXOL 300 MG/ML  SOLN
100.0000 mL | Freq: Once | INTRAMUSCULAR | Status: AC | PRN
  Administered 2021-04-21: 10 mL

## 2021-04-21 MED ORDER — LIDOCAINE VISCOUS HCL 2 % MT SOLN
OROMUCOSAL | Status: AC
  Filled 2021-04-21: qty 15

## 2021-04-27 ENCOUNTER — Emergency Department (HOSPITAL_COMMUNITY)

## 2021-04-27 ENCOUNTER — Other Ambulatory Visit: Payer: Self-pay

## 2021-04-27 ENCOUNTER — Emergency Department (HOSPITAL_COMMUNITY)
Admission: EM | Admit: 2021-04-27 | Discharge: 2021-04-28 | Disposition: A | Discharge status: Home / Self Care | Attending: Emergency Medicine | Admitting: Emergency Medicine

## 2021-04-27 DIAGNOSIS — Z85038 Personal history of other malignant neoplasm of large intestine: Secondary | ICD-10-CM | POA: Diagnosis not present

## 2021-04-27 DIAGNOSIS — R7401 Elevation of levels of liver transaminase levels: Secondary | ICD-10-CM | POA: Diagnosis not present

## 2021-04-27 DIAGNOSIS — Z8546 Personal history of malignant neoplasm of prostate: Secondary | ICD-10-CM | POA: Insufficient documentation

## 2021-04-27 DIAGNOSIS — Z8501 Personal history of malignant neoplasm of esophagus: Secondary | ICD-10-CM | POA: Diagnosis not present

## 2021-04-27 DIAGNOSIS — K9423 Gastrostomy malfunction: Secondary | ICD-10-CM | POA: Insufficient documentation

## 2021-04-27 DIAGNOSIS — I10 Essential (primary) hypertension: Secondary | ICD-10-CM | POA: Insufficient documentation

## 2021-04-27 DIAGNOSIS — R748 Abnormal levels of other serum enzymes: Secondary | ICD-10-CM | POA: Insufficient documentation

## 2021-04-27 LAB — COMPREHENSIVE METABOLIC PANEL
ALT: 67 U/L — ABNORMAL HIGH (ref 0–44)
AST: 125 U/L — ABNORMAL HIGH (ref 15–41)
Albumin: 2.9 g/dL — ABNORMAL LOW (ref 3.5–5.0)
Alkaline Phosphatase: 225 U/L — ABNORMAL HIGH (ref 38–126)
Anion gap: 12 (ref 5–15)
BUN: 11 mg/dL (ref 8–23)
CO2: 22 mmol/L (ref 22–32)
Calcium: 9.4 mg/dL (ref 8.9–10.3)
Chloride: 101 mmol/L (ref 98–111)
Creatinine, Ser: 0.68 mg/dL (ref 0.61–1.24)
GFR, Estimated: >60 mL/min (ref >60)
Glucose, Bld: 107 mg/dL — ABNORMAL HIGH (ref 70–99)
Potassium: 3.5 mmol/L (ref 3.5–5.1)
Sodium: 135 mmol/L (ref 135–145)
Total Bilirubin: 2.2 mg/dL — ABNORMAL HIGH (ref 0.3–1.2)
Total Protein: 7.3 g/dL (ref 6.5–8.1)

## 2021-04-27 LAB — CBC WITH DIFFERENTIAL/PLATELET
Abs Immature Granulocytes: 0.05 10*3/uL (ref 0.00–0.07)
Basophils Absolute: 0 10*3/uL (ref 0.0–0.1)
Basophils Relative: 0 %
Eosinophils Absolute: 0 10*3/uL (ref 0.0–0.5)
Eosinophils Relative: 0 %
HCT: 30.3 % — ABNORMAL LOW (ref 39.0–52.0)
Hemoglobin: 9.7 g/dL — ABNORMAL LOW (ref 13.0–17.0)
Immature Granulocytes: 1 %
Lymphocytes Relative: 5 %
Lymphs Abs: 0.4 10*3/uL — ABNORMAL LOW (ref 0.7–4.0)
MCH: 26.4 pg (ref 26.0–34.0)
MCHC: 32 g/dL (ref 30.0–36.0)
MCV: 82.3 fL (ref 80.0–100.0)
Monocytes Absolute: 0.8 10*3/uL (ref 0.1–1.0)
Monocytes Relative: 9 %
Neutro Abs: 7.5 10*3/uL (ref 1.7–7.7)
Neutrophils Relative %: 85 %
Platelets: 259 10*3/uL (ref 150–400)
RBC: 3.68 MIL/uL — ABNORMAL LOW (ref 4.22–5.81)
RDW: 17.9 % — ABNORMAL HIGH (ref 11.5–15.5)
WBC: 8.9 10*3/uL (ref 4.0–10.5)
nRBC: 0 % (ref 0.0–0.2)

## 2021-04-27 LAB — LIPASE, BLOOD: Lipase: 289 U/L — ABNORMAL HIGH (ref 11–51)

## 2021-04-27 MED ORDER — DIATRIZOATE MEGLUMINE & SODIUM 66-10 % PO SOLN
ORAL | Status: DC
Start: 2021-04-27 — End: 2021-04-28
  Filled 2021-04-27: qty 30

## 2021-04-27 MED ORDER — SODIUM CHLORIDE 0.9% FLUSH: 10.0000 mL | INTRAVENOUS | Status: DC | PRN

## 2021-04-27 MED ORDER — CHLORHEXIDINE GLUCONATE CLOTH 2 % EX PADS: 6.0000 | MEDICATED_PAD | Freq: Every day | CUTANEOUS | Status: DC

## 2021-04-27 MED ORDER — IOHEXOL 300 MG/ML  SOLN
80.0000 mL | Freq: Once | INTRAMUSCULAR | Status: AC | PRN
  Administered 2021-04-27: 80 mL via INTRAVENOUS

## 2021-04-27 NOTE — ED Provider Notes (Signed)
?[facility] ?Provider Note ? ? ?CSN: [id number] ?Arrival date & time: 04/27/21  7416 ? ?  ? ?History ? ?Chief Complaint  ?Patient presents with  ? Peg tube complaint  ? ? ?Marcus [name] is a 76 y.o. male. ? 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 Patient states that he was at his facility earlier today and staff accidentally pulled on his PEG tube. There was some concern that it had been dislodged and they subsequently got a KUB which was inconclusive, recommended pre and post KUB with Gastrografin.  Was sent here for same.  Of note, patient does state that he is having some increasing abdominal pain recently but denies any nausea or vomiting. ? ?The history is provided by the patient. No language interpreter was used.  ? ?  ? ?Home Medications ?Prior to Admission medications   ?Medication Sig Start Date End Date Taking? Authorizing Provider  ?Acetaminophen (TYLENOL PO) Take 650 mg by mouth See admin instructions. Per MAR - acetaminophen (mapap) 500 mg/15 ml liquid: 19.5 ml (650 mg) per G-tube every 6 hours as needed for mild pain   Yes [provider]  ?Amino Acids-Protein Hydrolys (PRO-STAT MAX) LIQD Give 30 mLs by tube 2 (two) times daily. for supplement   Yes [provider]  ?ferrous sulfate 220 (44 Fe) MG/5ML solution Place 300 mg into feeding tube every morning.   Yes [provider]  ?LORazepam (ATIVAN) 0.5 MG tablet Take 1 tablet (0.5 mg total) by mouth 2 (two) times daily as needed for anxiety. ?Patient taking differently: Place 0.5 mg under the tongue 2 (two) times daily as needed for anxiety. 04/18/21 04/18/22 Yes Medina-Vargas, Monina C, NP  ?mirtazapine (REMERON) 15 MG tablet Place 15 mg into feeding tube at  bedtime.   Yes [provider]  ?Multiple Vitamin (MULTIVITAMIN) LIQD Place 10 mLs into feeding tube every morning.   Yes [provider]  ?Nutritional Supplements (ENSURE ENLIVE PO) Place 237 mLs into feeding tube 3 (three) times daily.   Yes [provider]  ?Nutritional Supplements (ISOSOURCE 1.5 CAL) LIQD Place 45 mL/hr into feeding tube continuous.   Yes [provider]  ?Nutritional Supplements (NUTRITIONAL SUPPLEMENT PO) Place 1 Dose into feeding tube 2 (two) times daily. Magic Cup   Yes [provider]  ?ondansetron (ZOFRAN) 4 MG tablet Place 4 mg into feeding tube every 8 (eight) hours as needed for vomiting or nausea.   Yes [provider]  ?oxyCODONE HCl 10 MG/0.5ML CONC Take 5 mg by mouth every 6 (six) hours as needed (for pain management). ?Patient taking differently: Place 5 mg into feeding tube See admin instructions. Take 0.25 ml (5 mg) sublingually every 4 hours, may also take 0.25 mls sublingually every 6 hours as needed for pain management 04/18/21  Yes Medina-Vargas, Monina C, NP  ?polyethylene glycol (MIRALAX / GLYCOLAX) 17 g packet Place 17 g into feeding tube every other day. Mix with 4-8 oz water   Yes [provider]  ?simethicone (MYLICON) 384 MG chewable tablet Place 125 mg into feeding tube 3 (three) times daily as needed (gas/pain).   Yes [provider]  ?Water For Irrigation, Sterile (FREE WATER) SOLN Place 100 mLs into feeding tube every 8 (eight) hours. 50 ml free water flush before  and after each TF bolus given ?Patient taking differently: Place 150-200 mLs into feeding tube See admin instructions. Flush peg tube with 150 mls water every 4 hours. Also give 200 ml water flushes three times daily 10/28/20  Yes Mariel Aloe, MD  ?acetaminophen (TYLENOL) 160 MG/5ML solution Place 20.3 mLs (650 mg total) into feeding tube every 6 (six) hours as needed for mild pain. ?Patient not taking: Reported on 04/27/2021  10/28/20   Mariel Aloe, MD  ?   ? ?Allergies    ?Patient has no known allergies.   ? ?Review of Systems   ?Review of Systems  ?Constitutional:  Negative for chills and fever.  ?Gastrointestinal:  Positive for abdominal pain. Negative for nausea and vomiting.  ?All other systems reviewed and are negative. ? ?Physical Exam ?Updated Vital Signs ?BP 107/76   Pulse 73   Temp 98.3 ?F (36.8 ?C) (Oral)   Resp 17   SpO2 97%  ?Physical Exam ?Vitals and nursing note reviewed.  ?Constitutional:   ?   General: He is not in acute distress. ?   Appearance: Normal appearance. He is normal weight. He is not ill-appearing, toxic-appearing or diaphoretic.  ?HENT:  ?   Head: Normocephalic and atraumatic.  ?Cardiovascular:  ?   Rate and Rhythm: Normal rate.  ?Pulmonary:  ?   Effort: Pulmonary effort is normal. No respiratory distress.  ?Abdominal:  ?   General: Abdomen is flat.  ?   Palpations: Abdomen is soft.  ?   Tenderness: There is abdominal tenderness. There is no right CVA tenderness or left CVA tenderness.  ?   Comments: Ostomy in place, PEG tube in place, non-infectious appearing.  ?Musculoskeletal:     ?   General: Normal range of motion.  ?   Cervical back: Normal range of motion.  ?Skin: ?   General: Skin is warm and dry.  ?Neurological:  ?   General: No focal deficit present.  ?   Mental Status: He is alert.  ?Psychiatric:     ?   Mood and Affect: Mood normal.     ?   Behavior: Behavior normal.  ? ? ?ED Results / Procedures / Treatments   ?Labs ?(all labs ordered are listed, but only abnormal results are displayed) ?Labs Reviewed  ?COMPREHENSIVE METABOLIC PANEL - Abnormal; Notable for the following components:  ?    Result Value  ? Glucose, Bld 107 (*)   ? Albumin 2.9 (*)   ? AST 125 (*)   ? ALT 67 (*)   ? Alkaline Phosphatase 225 (*)   ? Total Bilirubin 2.2 (*)   ? All other components within normal limits  ?LIPASE, BLOOD - Abnormal; Notable for the following components:  ? Lipase 289 (*)   ? All other components  within normal limits  ?CBC WITH DIFFERENTIAL/PLATELET - Abnormal; Notable for the following components:  ? RBC 3.68 (*)   ? Hemoglobin 9.7 (*)   ? HCT 30.3 (*)   ? RDW 17.9 (*)   ? Lymphs Abs 0.4 (*)   ? All other components within normal limits  ?URINALYSIS, ROUTINE W REFLEX MICROSCOPIC  ? ? ?EKG ?None ? ?Radiology ?DG Abdomen 1 View ? ?Result Date: 04/27/2021 ?CLINICAL DATA:  Possible displaced PEG tube. EXAM: ABDOMEN - 1 VIEW COMPARISON:  04/18/2021. FINDINGS: There are borderline distended loops of small bowel in the abdomen measuring up to 3.0 cm in diameter on image 1. A PEG tube terminates in the gastric body. Following administration  of Gastrografin, contrast into the PEG tube there is normal opacification at the gastric fundus. No definite evidence of contrast extravasation. Vascular calcifications are present in the pelvis. Right hip arthroplasty changes are noted. IMPRESSION: 1. PEG tube terminating in the gastric body with normal opacification of the gastric fundus. 2. Mildly distended loops of small bowel in the mid abdomen. Correlate clinically to exclude partial or early small-bowel obstruction. Electronically Signed   By: Brett Fairy M.D.   On: 04/27/2021 20:48  ? ?CT ABDOMEN PELVIS W CONTRAST ? ?Result Date: 04/27/2021 ?CLINICAL DATA:  PEG tube coming out again, bowel obstruction suspected. EXAM: CT ABDOMEN AND PELVIS WITH CONTRAST TECHNIQUE: Multidetector CT imaging of the abdomen and pelvis was performed using the standard protocol following bolus administration of intravenous contrast. RADIATION DOSE REDUCTION: This exam was performed according to the departmental dose-optimization program which includes automated exposure control, adjustment of the mA and/or kV according to patient size and/or use of iterative reconstruction technique. CONTRAST:  24m OMNIPAQUE IOHEXOL 300 MG/ML  SOLN COMPARISON:  10/07/2020. FINDINGS: Lower chest: The distal tip of central venous catheter terminates in the  right atrium. Mild dependent atelectasis is noted bilaterally. There is a trace pleural effusion on the left. Hepatobiliary: The liver is enlarged and hypodense masses are noted in both lobes measuring up to 9 cm. There is

## 2021-04-27 NOTE — Progress Notes (Signed)
[facility] Hospital Liaison Note  This is a current [facility] hospice patient. Please call with any questions or concerns. Thank you  Shanita Wicker, LCSW [facility] Hospital Liaison [phone] 

## 2021-04-27 NOTE — ED Notes (Signed)
Pt cleaned, changed, and colostomy bag emptied.  ?

## 2021-04-27 NOTE — ED Notes (Signed)
CT notified pt has access for CT abd/pelv ?

## 2021-04-27 NOTE — ED Triage Notes (Signed)
Pt from [facility] for his peg tub coming out again. No complaints from patient.  ?

## 2021-04-27 NOTE — ED Provider Notes (Signed)
?  Provider Note ?MRN:  [id number]  ?Arrival date & time: 04/28/21    ?ED Course and Medical Decision Making  ?Assumed care from Dr. Zenia Resides at shift change. ? ?Metastatic cancer, sent here for question SBO on plain film awaiting CT results.  There is also question of PEG tube dislodgment but this has been confirmed to be in the right place. ? ? ?CT scan does not reveal any small bowel obstruction or acute emergencies.  There is progression of the cancer with encasement of the abdominal vessels.  On reassessment patient is sleeping comfortably, normal vital signs.  No signs of bowel ischemia or acute worsening of condition.  Patient is confused and unable to comprehend CT results.  Discussed with patient's brother-in-law, this has been patient's most recent baseline.  Brother-in-law made aware of the patient's worsening of condition and is thankful for the information, will discuss with the rest the family.  Agreeable that patient should return to [facility] for hospice management. ? ? ?Procedures ? ?Final Clinical Impressions(s) / ED Diagnoses  ? ?  ICD-10-CM   ?1. PEG tube malfunction ([place])  K94.23   ?  ?  ?ED Discharge Orders   ? ? None  ? ?  ?  ? ? ?Discharge Instructions   ? ?  ?You were evaluated in the Emergency Department and after careful evaluation, we did not find any emergent condition requiring admission or further testing in the hospital. ? ?Your CT scan today showed significant progression of the cancer.  The cancer is starting to push against your blood vessels in your abdomen.  This could cause significant pain in the near future and may hasten your death.  We are hopeful that your hospice team can manage your symptoms.  Please return to the emergency department if this is not the case. ? ? ? ? ? ?Barth Kirks. Sedonia Small, MD ?[facility] ?[email or web address] ? ?  ?Maudie Flakes, MD ?04/28/21 [phone] ? ?

## 2021-04-28 MED ORDER — HEPARIN SOD (PORK) LOCK FLUSH 100 UNIT/ML IV SOLN
500.0000 [IU] | Freq: Once | INTRAVENOUS | Status: AC
Start: 2021-04-28 — End: 2021-04-28
  Administered 2021-04-28: 500 [IU]
  Filled 2021-04-28: qty 5

## 2021-04-28 NOTE — ED Notes (Signed)
[facility] called  

## 2021-04-28 NOTE — Discharge Instructions (Addendum)
You were evaluated in the Emergency Department and after careful evaluation, we did not find any emergent condition requiring admission or further testing in the hospital. ? ?Your CT scan today showed significant progression of the cancer.  The cancer is starting to push against your blood vessels in your abdomen.  This could cause significant pain in the near future and may hasten your death.  We are hopeful that your hospice team can manage your symptoms.  Please return to the emergency department if this is not the case. ?

## 2021-04-28 NOTE — ED Notes (Signed)
RN attempted to call [place] x2 ?

## 2021-05-15 DEATH — deceased

## 2021-07-19 ENCOUNTER — Encounter (HOSPITAL_COMMUNITY): Payer: Self-pay | Admitting: Surgery

## 2022-01-14 IMAGING — CR DG RIBS 2V*R*
4 series · 4 of 4 positions shown · non-contrast
Comparison: 07/23/2013

CLINICAL DATA: Lower rib pain, back pain

EXAM:
RIGHT RIBS - 2 VIEW

[rib pa obl (1 of 4)]
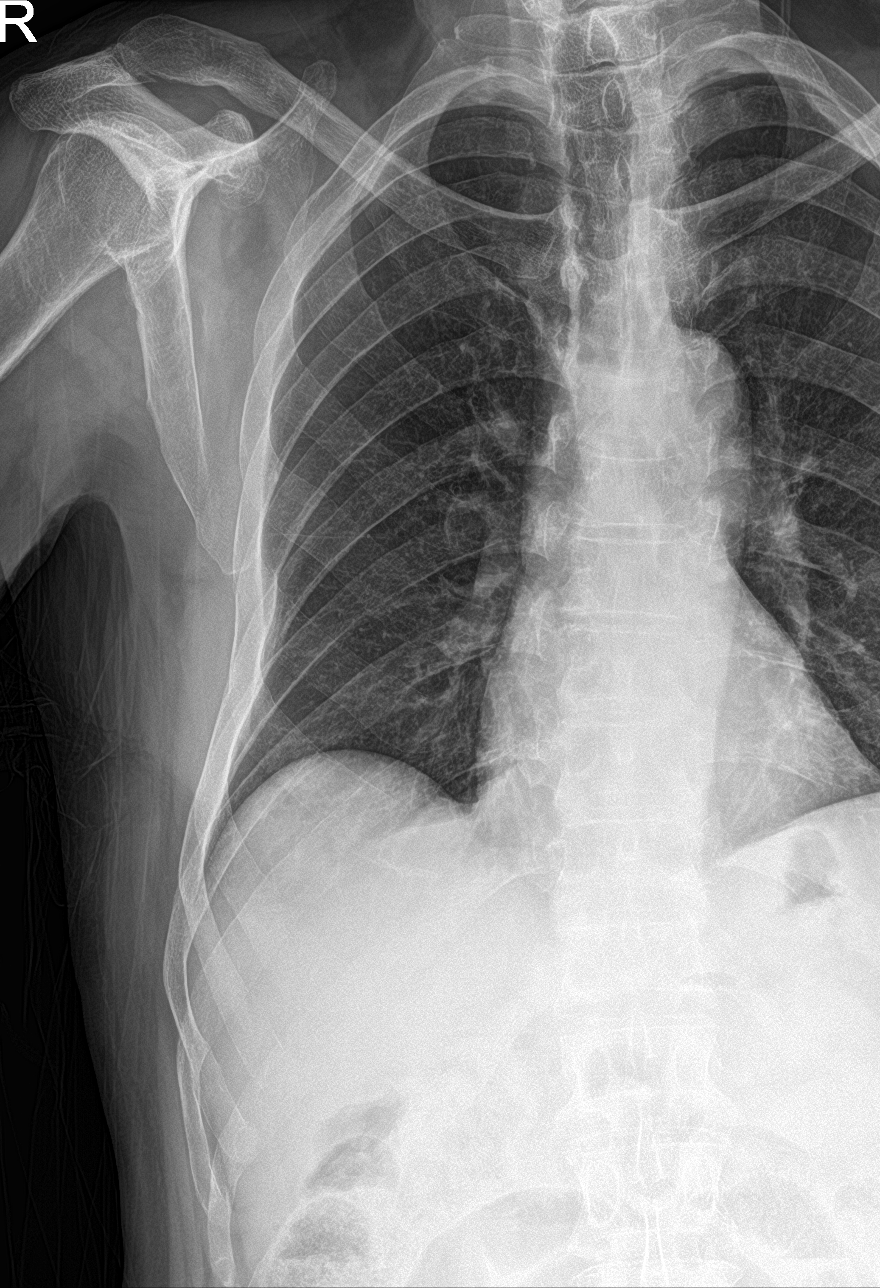

[rib pa obl (2 of 4)]
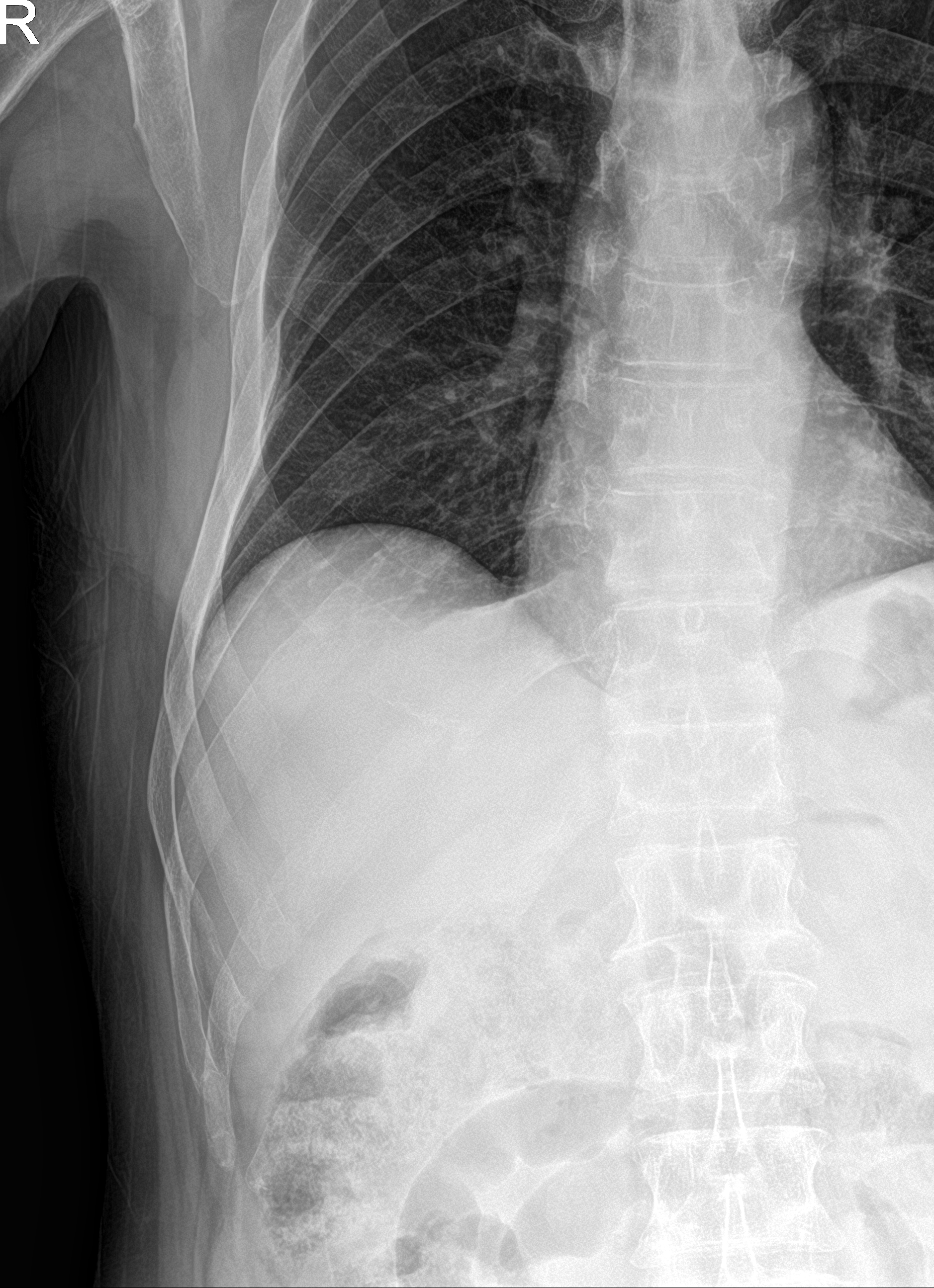

[rib pa obl (3 of 4)]
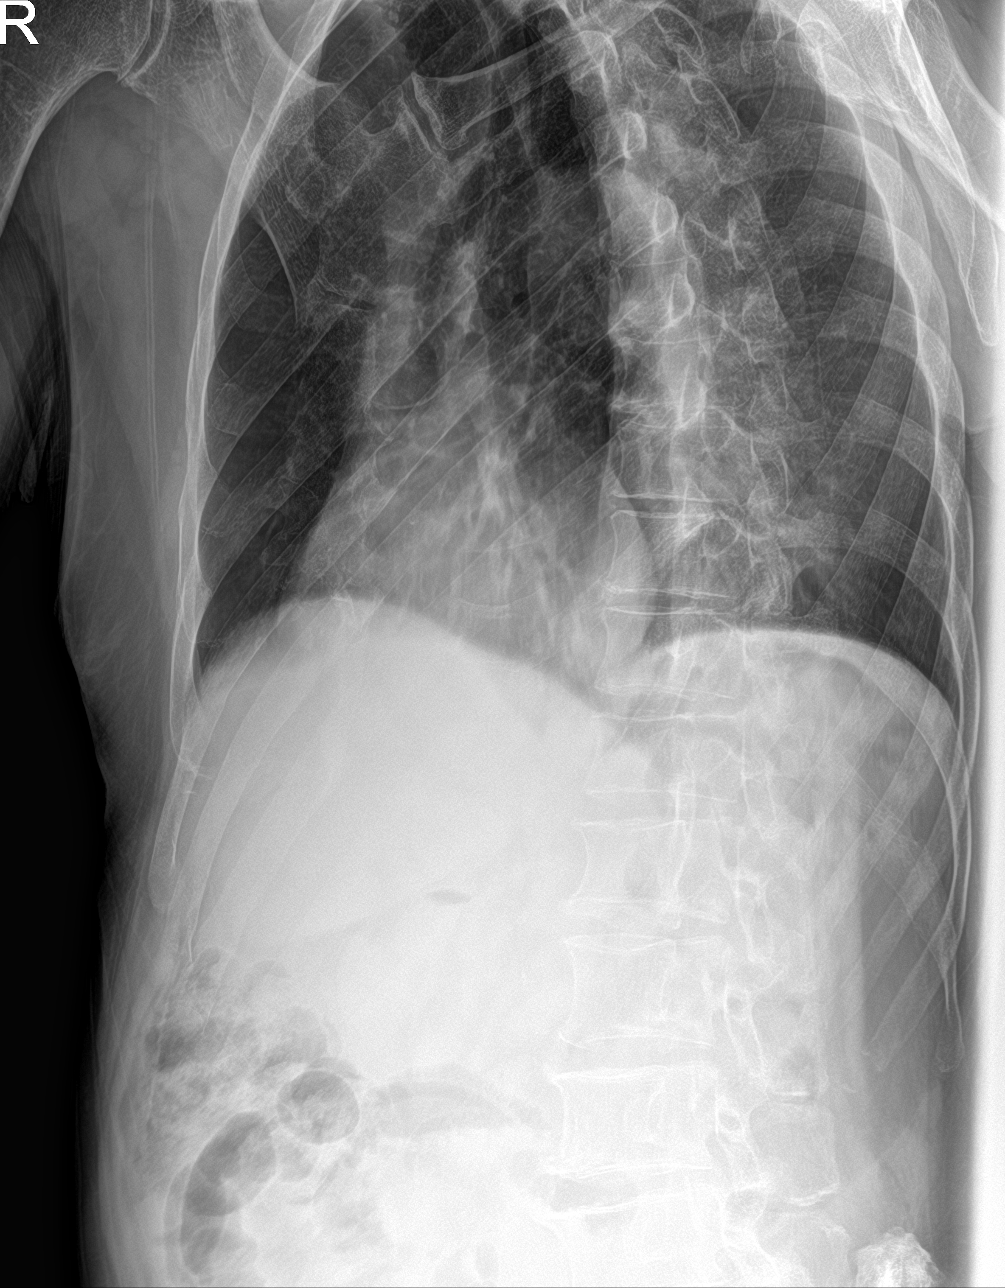

[rib pa obl (4 of 4)]
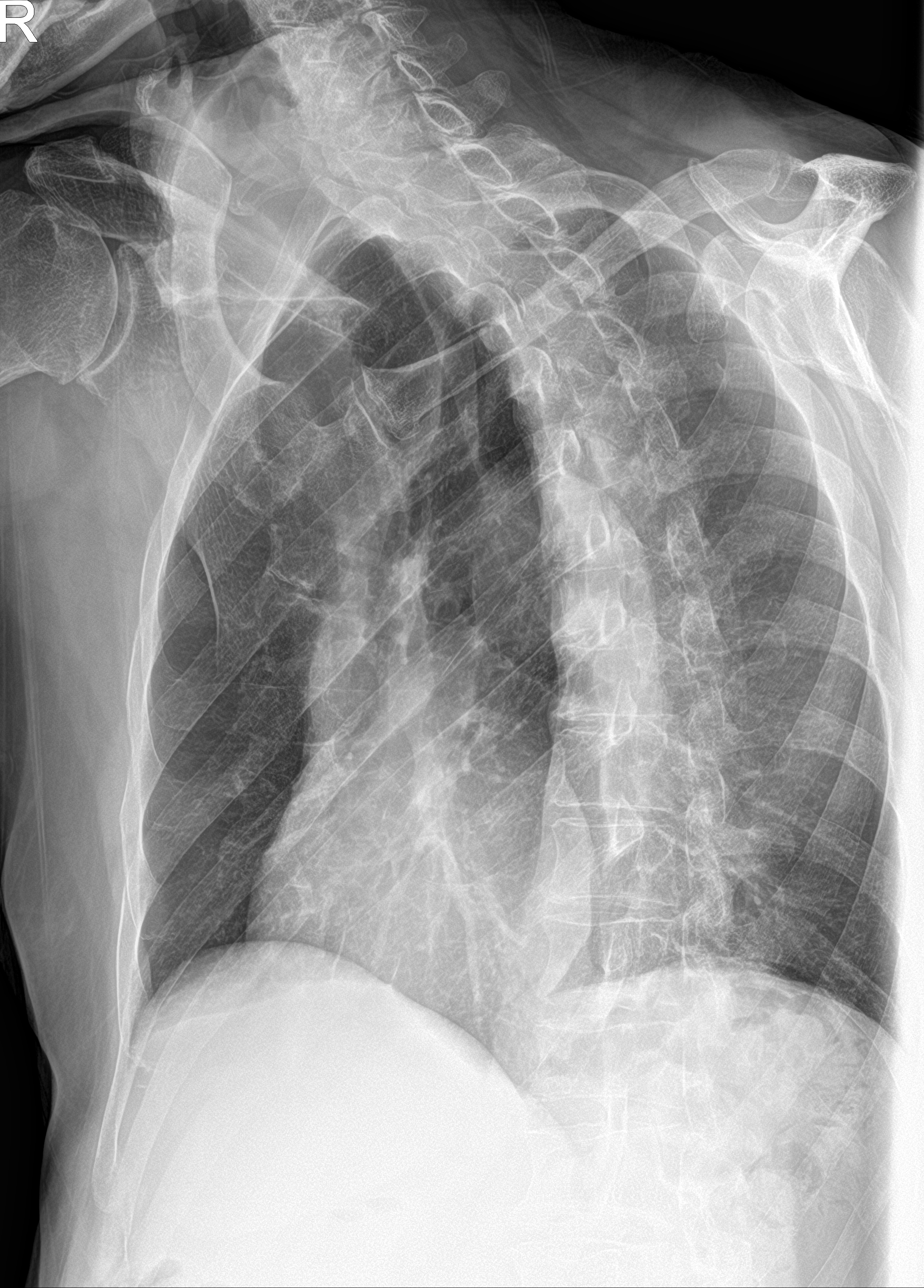

[4 of 4 positions shown; findings below may reference images not displayed]

FINDINGS: No fracture or other bone lesions are seen involving the ribs.
IMPRESSION: Negative.

## 2022-01-14 IMAGING — CR DG RIBS W/ CHEST 3+V*L*
3 series · 3 of 3 positions shown · non-contrast
Comparison: 07/23/2013

CLINICAL DATA: Lower rib pain, back pain

EXAM:
LEFT RIBS AND CHEST - 3+ VIEW

[chest pa]
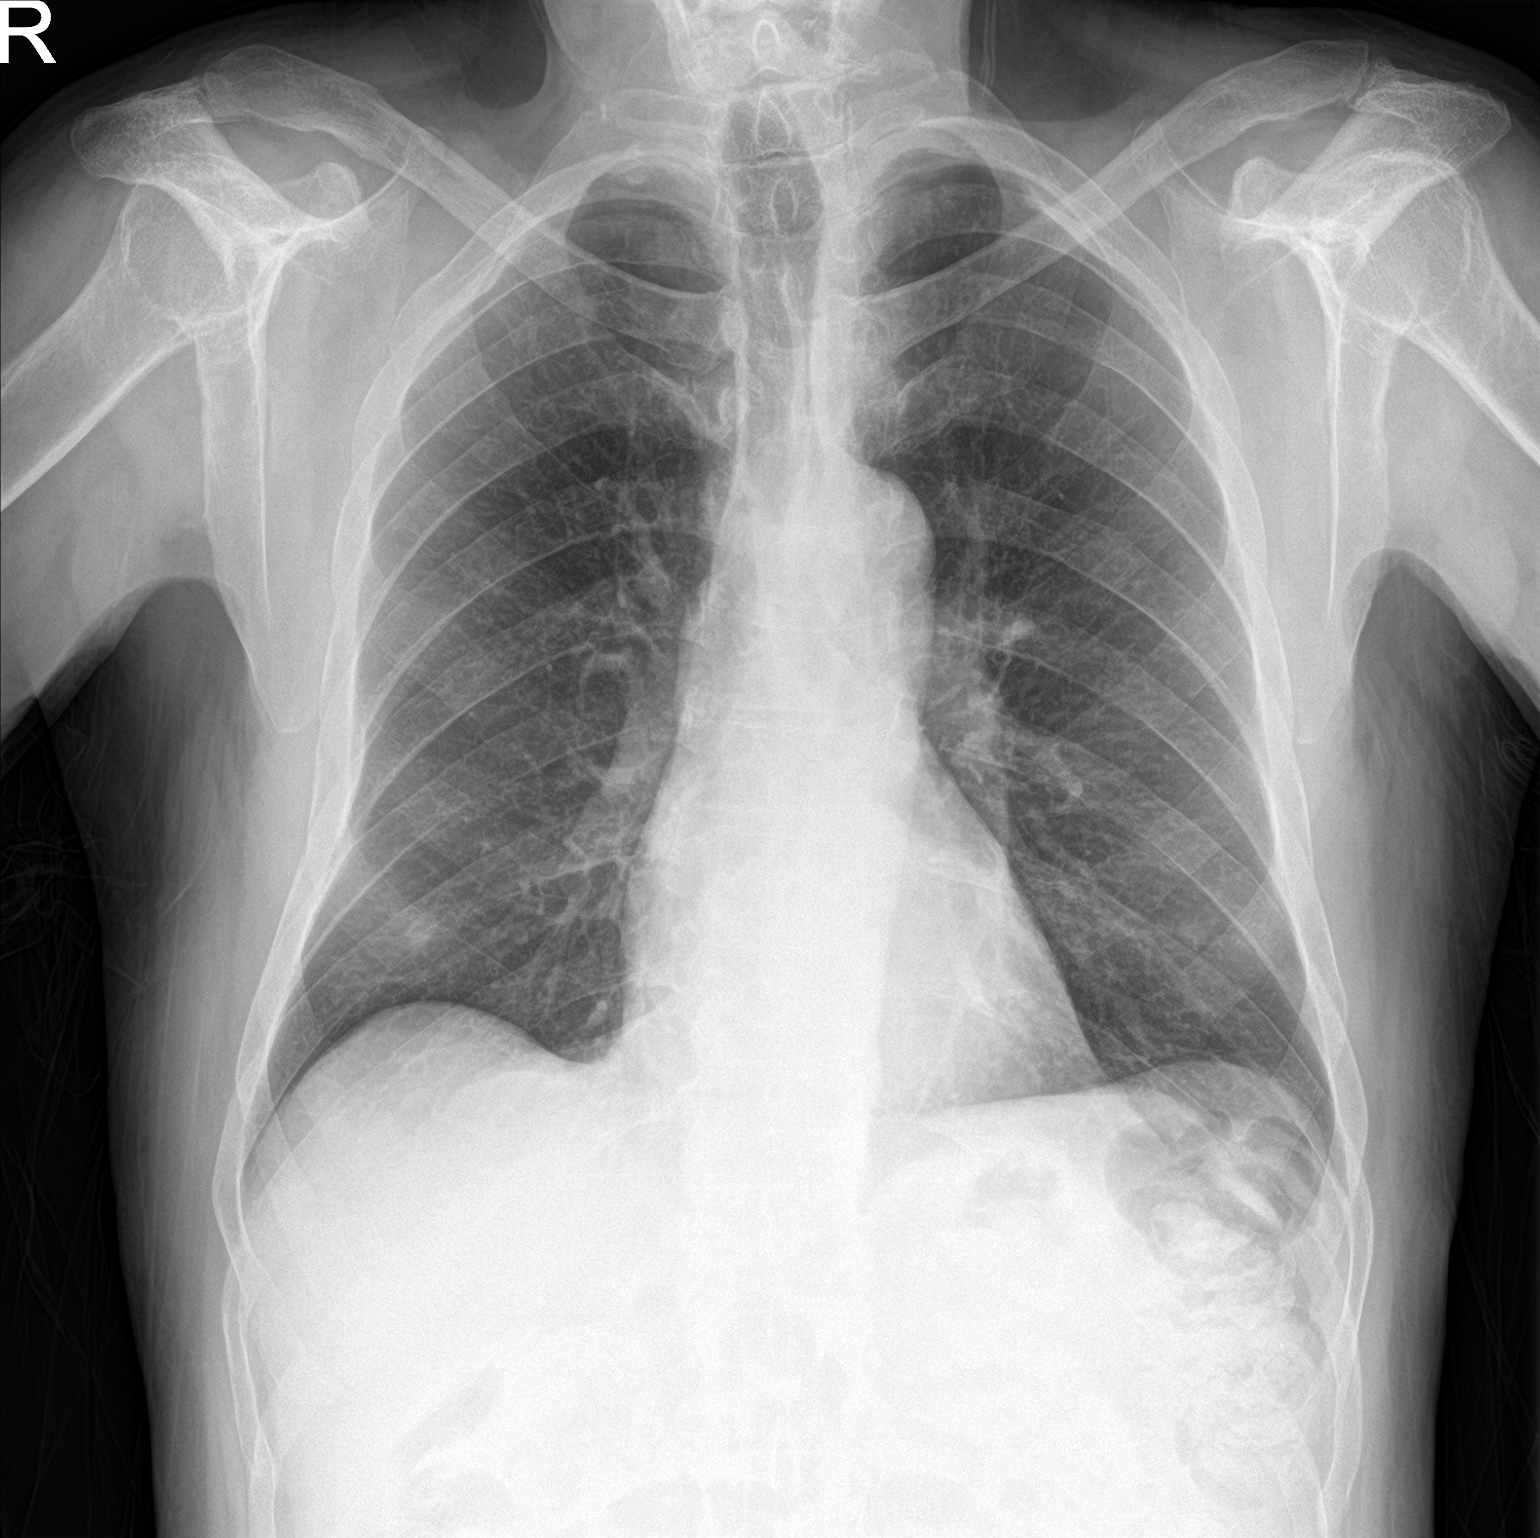

[rib ap]
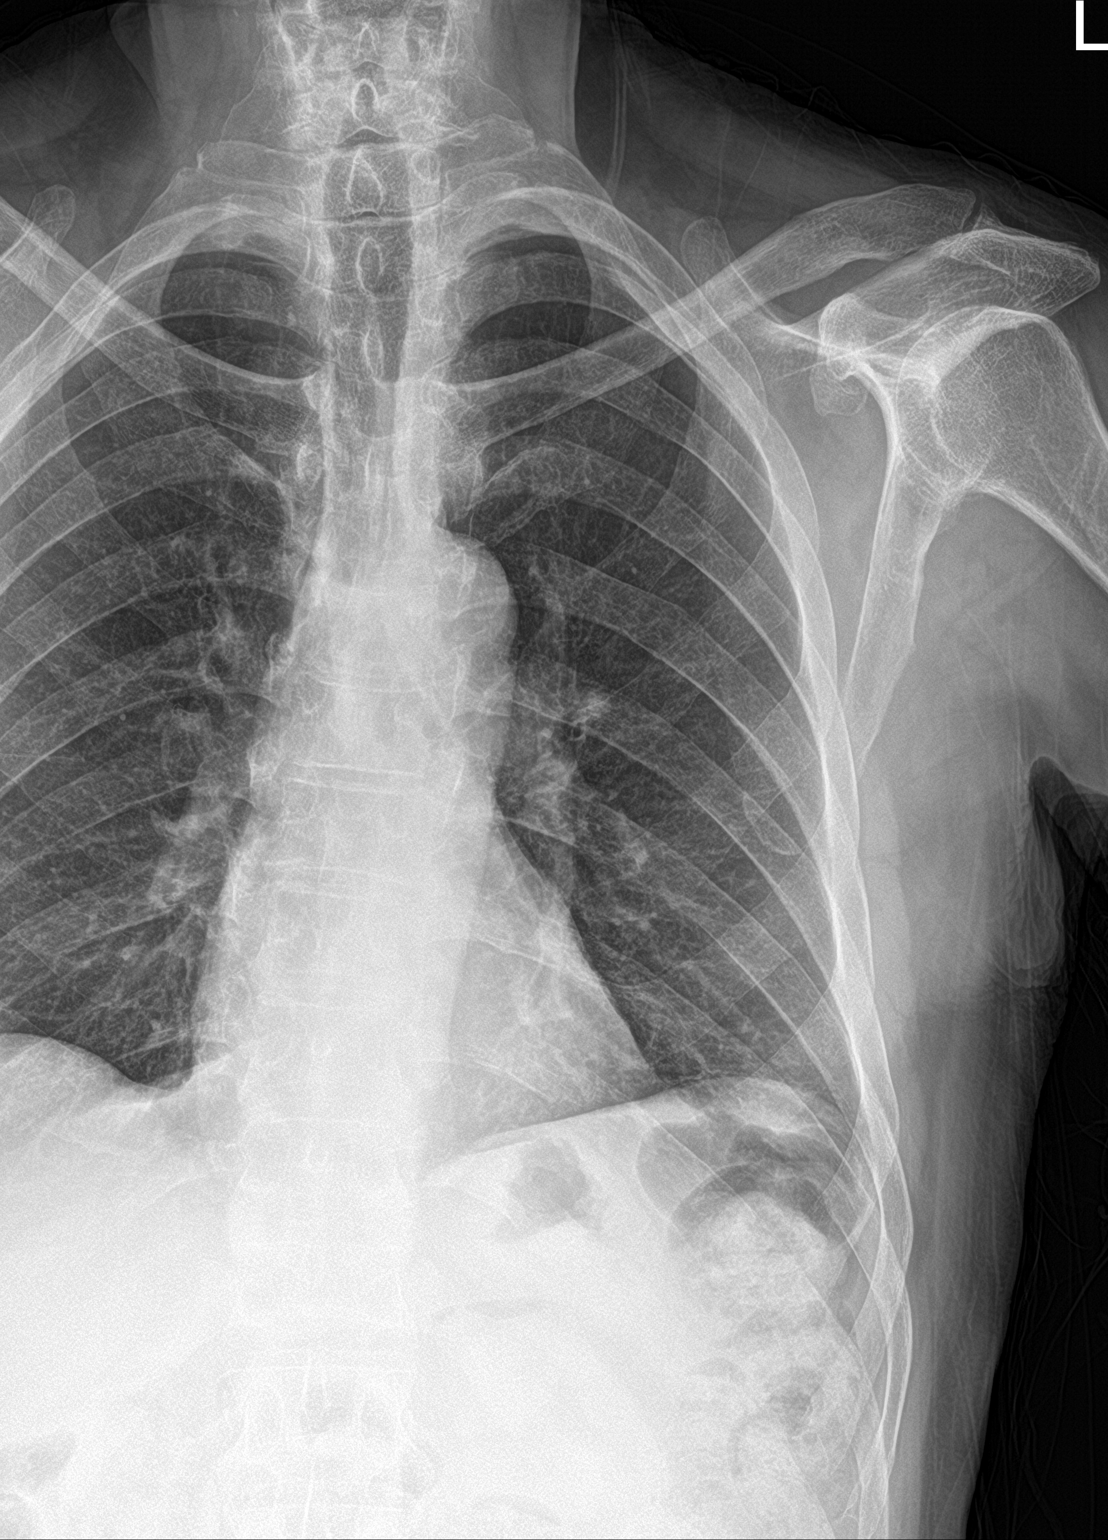

[rib pa obl]
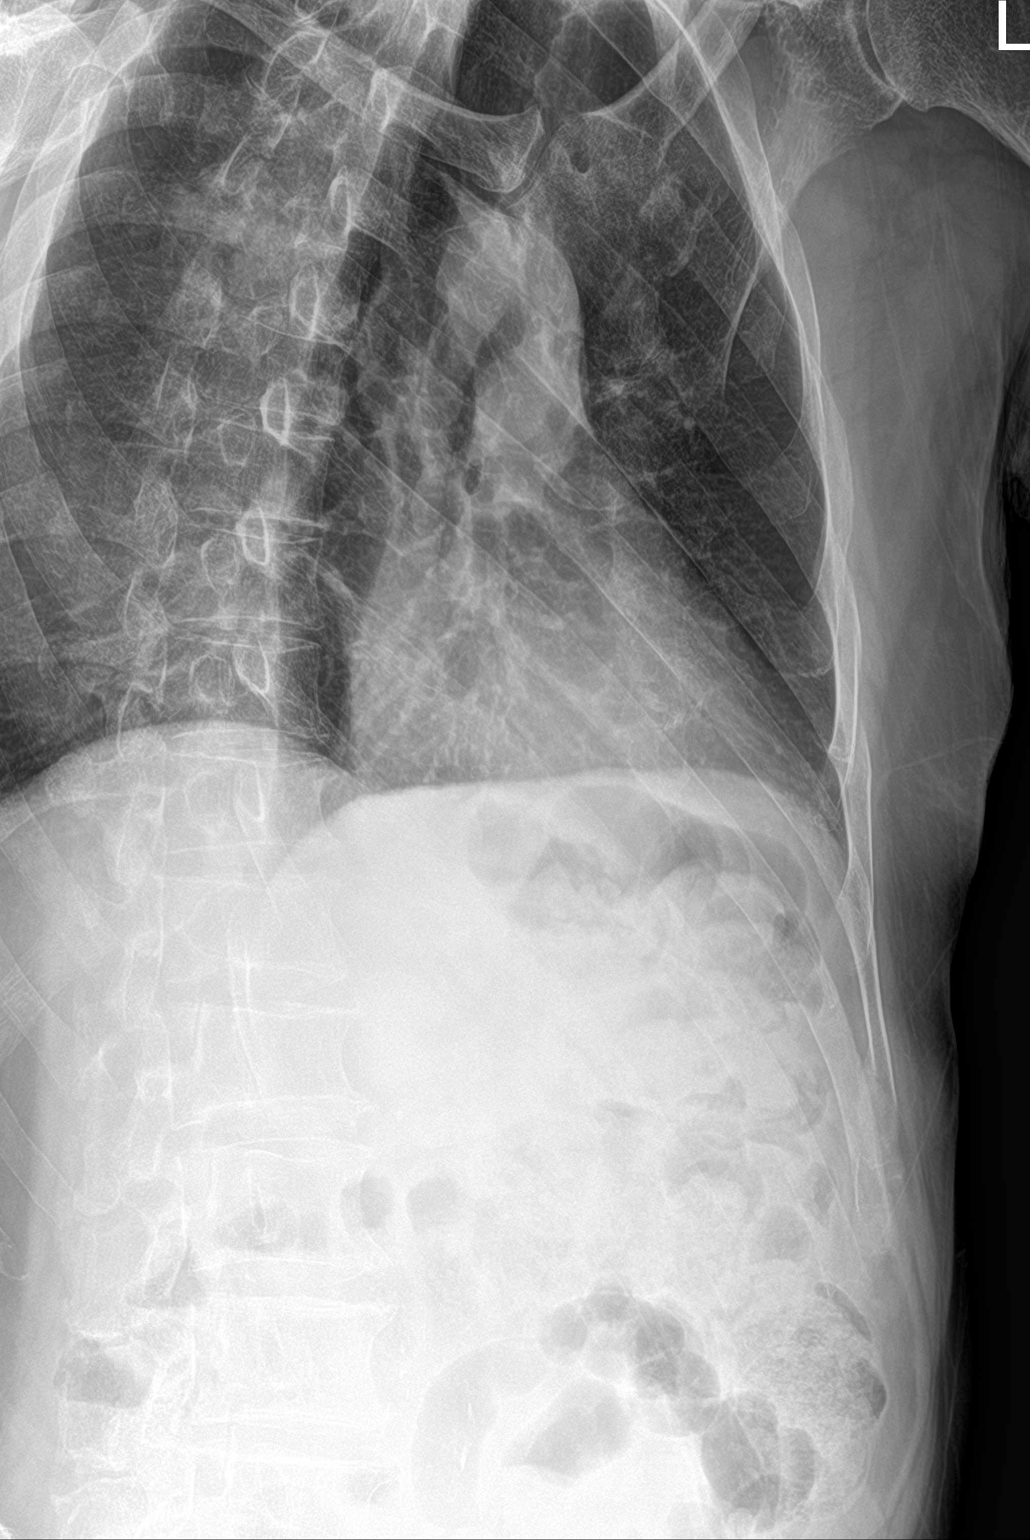

[3 of 3 positions shown; findings below may reference images not displayed]

FINDINGS: No fracture or other bone lesions are seen involving the ribs. There
is no evidence of pneumothorax or pleural effusion. Both lungs are
clear. Heart size and mediastinal contours are within normal limits.
IMPRESSION: Negative.
